# Patient Record
Sex: Female | Born: 1940 | Race: White | Hispanic: No | Marital: Married | Smoking: Never smoker
Health system: Southern US, Community
[De-identification: ages and names within clinical notes are randomized; demographics above are authoritative.]

## PROBLEM LIST (undated history)

## (undated) DIAGNOSIS — C50919 Malignant neoplasm of unspecified site of unspecified female breast: Secondary | ICD-10-CM

## (undated) DIAGNOSIS — K219 Gastro-esophageal reflux disease without esophagitis: Secondary | ICD-10-CM

## (undated) DIAGNOSIS — D649 Anemia, unspecified: Secondary | ICD-10-CM

## (undated) DIAGNOSIS — I10 Essential (primary) hypertension: Secondary | ICD-10-CM

## (undated) HISTORY — DX: Essential (primary) hypertension: I10

## (undated) HISTORY — PX: OTHER SURGICAL HISTORY: SHX169

---

## 1968-04-12 HISTORY — PX: DILATION AND CURETTAGE OF UTERUS: SHX78

## 1998-07-18 ENCOUNTER — Other Ambulatory Visit: Admission: RE | Admit: 1998-07-18 | Discharge: 1998-07-18 | Payer: Self-pay | Admitting: Obstetrics and Gynecology

## 1999-07-29 ENCOUNTER — Other Ambulatory Visit: Admission: RE | Admit: 1999-07-29 | Discharge: 1999-07-29 | Payer: Self-pay | Admitting: Obstetrics and Gynecology

## 2000-07-11 ENCOUNTER — Other Ambulatory Visit: Admission: RE | Admit: 2000-07-11 | Discharge: 2000-07-11 | Payer: Self-pay | Admitting: *Deleted

## 2001-11-28 ENCOUNTER — Other Ambulatory Visit: Admission: RE | Admit: 2001-11-28 | Discharge: 2001-11-28 | Payer: Self-pay | Admitting: Obstetrics and Gynecology

## 2002-11-13 ENCOUNTER — Encounter: Payer: Self-pay | Admitting: Obstetrics and Gynecology

## 2002-11-13 ENCOUNTER — Encounter: Admission: RE | Admit: 2002-11-13 | Discharge: 2002-11-13 | Payer: Self-pay | Admitting: Obstetrics and Gynecology

## 2002-12-04 ENCOUNTER — Other Ambulatory Visit: Admission: RE | Admit: 2002-12-04 | Discharge: 2002-12-04 | Payer: Self-pay | Admitting: Obstetrics and Gynecology

## 2003-01-16 ENCOUNTER — Ambulatory Visit (HOSPITAL_BASED_OUTPATIENT_CLINIC_OR_DEPARTMENT_OTHER): Admission: RE | Admit: 2003-01-16 | Discharge: 2003-01-16 | Payer: Self-pay | Admitting: Orthopedic Surgery

## 2003-01-16 ENCOUNTER — Ambulatory Visit (HOSPITAL_COMMUNITY): Admission: RE | Admit: 2003-01-16 | Discharge: 2003-01-16 | Payer: Self-pay | Admitting: Orthopedic Surgery

## 2003-01-16 ENCOUNTER — Encounter (INDEPENDENT_AMBULATORY_CARE_PROVIDER_SITE_OTHER): Payer: Self-pay | Admitting: *Deleted

## 2004-01-23 ENCOUNTER — Encounter: Admission: RE | Admit: 2004-01-23 | Discharge: 2004-01-23 | Payer: Self-pay | Admitting: Obstetrics and Gynecology

## 2005-02-22 ENCOUNTER — Encounter: Admission: RE | Admit: 2005-02-22 | Discharge: 2005-02-22 | Payer: Self-pay | Admitting: Family Medicine

## 2005-03-08 ENCOUNTER — Other Ambulatory Visit: Admission: RE | Admit: 2005-03-08 | Discharge: 2005-03-08 | Payer: Self-pay | Admitting: Family Medicine

## 2005-03-08 ENCOUNTER — Encounter: Admission: RE | Admit: 2005-03-08 | Discharge: 2005-03-08 | Payer: Self-pay | Admitting: Family Medicine

## 2005-09-27 ENCOUNTER — Emergency Department (HOSPITAL_COMMUNITY): Admission: EM | Admit: 2005-09-27 | Discharge: 2005-09-27 | Payer: Self-pay | Admitting: Emergency Medicine

## 2006-05-03 ENCOUNTER — Encounter: Admission: RE | Admit: 2006-05-03 | Discharge: 2006-05-03 | Payer: Self-pay | Admitting: Obstetrics and Gynecology

## 2006-05-13 ENCOUNTER — Encounter: Admission: RE | Admit: 2006-05-13 | Discharge: 2006-05-13 | Payer: Self-pay | Admitting: Obstetrics and Gynecology

## 2007-05-17 ENCOUNTER — Encounter: Admission: RE | Admit: 2007-05-17 | Discharge: 2007-05-17 | Payer: Self-pay | Admitting: Obstetrics and Gynecology

## 2008-05-21 ENCOUNTER — Encounter: Admission: RE | Admit: 2008-05-21 | Discharge: 2008-05-21 | Payer: Self-pay | Admitting: Obstetrics and Gynecology

## 2009-06-11 ENCOUNTER — Encounter: Admission: RE | Admit: 2009-06-11 | Discharge: 2009-06-11 | Payer: Self-pay | Admitting: Obstetrics and Gynecology

## 2010-05-03 ENCOUNTER — Encounter: Payer: Self-pay | Admitting: Obstetrics and Gynecology

## 2010-08-21 ENCOUNTER — Other Ambulatory Visit: Payer: Self-pay | Admitting: Obstetrics and Gynecology

## 2010-08-21 DIAGNOSIS — Z1231 Encounter for screening mammogram for malignant neoplasm of breast: Secondary | ICD-10-CM

## 2010-08-27 ENCOUNTER — Ambulatory Visit: Payer: Self-pay

## 2010-08-28 NOTE — Op Note (Signed)
   NAME:  Rhonda Richardson, Rhonda Richardson                        ACCOUNT NO.:  [id number]   MEDICAL RECORD NO.:  [id number]                   PATIENT TYPE:  AMB   LOCATION:  DSC                                  FACILITY:  [facility]   PHYSICIAN:  Cindee Salt, M.D.                    DATE OF BIRTH:  1941/03/14   DATE OF PROCEDURE:  01/16/2003  DATE OF DISCHARGE:                                 OPERATIVE REPORT   PREOPERATIVE DIAGNOSIS:  Mucoid cyst, left ring finger.   POSTOPERATIVE DIAGNOSIS:  Mucoid cyst, left ring finger.   OPERATION PERFORMED:  Excision of cyst, debridement of distal phalangeal  joint, left ring finger.   SURGEON:  Cindee Salt, M.D.   ASSISTANTCarolyne Fiscal.   ANESTHESIA:  Forearm based IV regional.   INDICATIONS FOR PROCEDURE:  The patient is a 70 year old female with a  history of a large mucoid cyst on the ulnar aspect of the distal phalangeal  joint, left ring finger.  She desires having this removed.  Is aware of  risks and complications including infection, stiffness, recurrence.   DESCRIPTION OF PROCEDURE:  The patient was brought to the operating room  where a forearm based intravenous regional anesthetic was carried out  without difficulty.  She was prepped using DuraPrep in supine position with  the left arm free.  Curvilinear incision was made over the distal  interphalangeal joint, carried down through subcutaneous tissue.  A large  cyst was immediately encountered with a second cyst more proximally.  With  blunt and sharp dissection the cyst was removed protecting the extensor  tendon.  She did show a tendon sheath to form a mallet prior to the  procedure.  There was some stretching the extensor tendon secondary to the  arthritis.  A synovectomy was then performed along with the removal of  osteophytes from the distal interphalangeal joint. The cyst was sent to  pathology.  The wound was copiously irrigated with saline.  The skin was  then closed with interrupted 5-0  nylon suture.  Sterile compressive dressing  and a splint with the finger fully extended was applied.  The patient  tolerated the procedure well and was taken to the recovery room for  observation in satisfactory condition.  She is discharged to home to return  to the [facility] in one week on Vicodin and Keflex.                                                Cindee Salt, M.D.    Angelique Blonder  D:  01/16/2003  T:  01/16/2003  Job:  [id number]

## 2011-05-19 ENCOUNTER — Other Ambulatory Visit: Payer: Self-pay | Admitting: Obstetrics and Gynecology

## 2011-05-19 DIAGNOSIS — M8580 Other specified disorders of bone density and structure, unspecified site: Secondary | ICD-10-CM

## 2011-06-09 ENCOUNTER — Ambulatory Visit
Admission: RE | Admit: 2011-06-09 | Discharge: 2011-06-09 | Disposition: A | Discharge status: Home / Self Care | Payer: Medicare Other | Source: Ambulatory Visit | Attending: Obstetrics and Gynecology | Admitting: Obstetrics and Gynecology

## 2011-06-09 DIAGNOSIS — M8580 Other specified disorders of bone density and structure, unspecified site: Secondary | ICD-10-CM

## 2011-06-09 DIAGNOSIS — Z780 Asymptomatic menopausal state: Secondary | ICD-10-CM | POA: Diagnosis not present

## 2011-06-09 DIAGNOSIS — Z1231 Encounter for screening mammogram for malignant neoplasm of breast: Secondary | ICD-10-CM | POA: Diagnosis not present

## 2011-06-09 DIAGNOSIS — M899 Disorder of bone, unspecified: Secondary | ICD-10-CM | POA: Diagnosis not present

## 2011-06-09 DIAGNOSIS — M949 Disorder of cartilage, unspecified: Secondary | ICD-10-CM | POA: Diagnosis not present

## 2011-06-15 DIAGNOSIS — I10 Essential (primary) hypertension: Secondary | ICD-10-CM | POA: Diagnosis not present

## 2011-06-15 DIAGNOSIS — Z23 Encounter for immunization: Secondary | ICD-10-CM | POA: Diagnosis not present

## 2011-06-21 DIAGNOSIS — L819 Disorder of pigmentation, unspecified: Secondary | ICD-10-CM | POA: Diagnosis not present

## 2011-06-21 DIAGNOSIS — D239 Other benign neoplasm of skin, unspecified: Secondary | ICD-10-CM | POA: Diagnosis not present

## 2011-06-21 DIAGNOSIS — Z411 Encounter for cosmetic surgery: Secondary | ICD-10-CM | POA: Diagnosis not present

## 2011-07-22 DIAGNOSIS — Z124 Encounter for screening for malignant neoplasm of cervix: Secondary | ICD-10-CM | POA: Diagnosis not present

## 2011-12-20 DIAGNOSIS — Z0000 Encounter for general adult medical examination without abnormal findings: Secondary | ICD-10-CM | POA: Diagnosis not present

## 2011-12-20 DIAGNOSIS — I10 Essential (primary) hypertension: Secondary | ICD-10-CM | POA: Diagnosis not present

## 2012-03-30 DIAGNOSIS — Z23 Encounter for immunization: Secondary | ICD-10-CM | POA: Diagnosis not present

## 2012-04-18 DIAGNOSIS — J3489 Other specified disorders of nose and nasal sinuses: Secondary | ICD-10-CM | POA: Diagnosis not present

## 2012-04-18 DIAGNOSIS — H109 Unspecified conjunctivitis: Secondary | ICD-10-CM | POA: Diagnosis not present

## 2012-05-03 ENCOUNTER — Other Ambulatory Visit: Payer: Self-pay | Admitting: Obstetrics and Gynecology

## 2012-05-03 DIAGNOSIS — Z1231 Encounter for screening mammogram for malignant neoplasm of breast: Secondary | ICD-10-CM

## 2012-06-15 ENCOUNTER — Ambulatory Visit
Admission: RE | Admit: 2012-06-15 | Discharge: 2012-06-15 | Disposition: A | Discharge status: Home / Self Care | Payer: Medicare Other | Source: Ambulatory Visit | Attending: Obstetrics and Gynecology | Admitting: Obstetrics and Gynecology

## 2012-06-26 DIAGNOSIS — I10 Essential (primary) hypertension: Secondary | ICD-10-CM | POA: Diagnosis not present

## 2012-08-09 DIAGNOSIS — J309 Allergic rhinitis, unspecified: Secondary | ICD-10-CM | POA: Diagnosis not present

## 2012-08-09 DIAGNOSIS — J029 Acute pharyngitis, unspecified: Secondary | ICD-10-CM | POA: Diagnosis not present

## 2012-08-09 DIAGNOSIS — H6120 Impacted cerumen, unspecified ear: Secondary | ICD-10-CM | POA: Diagnosis not present

## 2012-11-09 DIAGNOSIS — R143 Flatulence: Secondary | ICD-10-CM | POA: Diagnosis not present

## 2012-11-09 DIAGNOSIS — R142 Eructation: Secondary | ICD-10-CM | POA: Diagnosis not present

## 2012-11-09 DIAGNOSIS — K219 Gastro-esophageal reflux disease without esophagitis: Secondary | ICD-10-CM | POA: Diagnosis not present

## 2013-01-04 DIAGNOSIS — K219 Gastro-esophageal reflux disease without esophagitis: Secondary | ICD-10-CM | POA: Diagnosis not present

## 2013-01-04 DIAGNOSIS — K5900 Constipation, unspecified: Secondary | ICD-10-CM | POA: Diagnosis not present

## 2013-01-04 DIAGNOSIS — R141 Gas pain: Secondary | ICD-10-CM | POA: Diagnosis not present

## 2013-01-24 DIAGNOSIS — Z124 Encounter for screening for malignant neoplasm of cervix: Secondary | ICD-10-CM | POA: Diagnosis not present

## 2013-01-24 DIAGNOSIS — Z01419 Encounter for gynecological examination (general) (routine) without abnormal findings: Secondary | ICD-10-CM | POA: Diagnosis not present

## 2013-01-31 DIAGNOSIS — Z23 Encounter for immunization: Secondary | ICD-10-CM | POA: Diagnosis not present

## 2013-02-28 DIAGNOSIS — R141 Gas pain: Secondary | ICD-10-CM | POA: Diagnosis not present

## 2013-02-28 DIAGNOSIS — K5900 Constipation, unspecified: Secondary | ICD-10-CM | POA: Diagnosis not present

## 2013-02-28 DIAGNOSIS — K219 Gastro-esophageal reflux disease without esophagitis: Secondary | ICD-10-CM | POA: Diagnosis not present

## 2013-03-14 DIAGNOSIS — K219 Gastro-esophageal reflux disease without esophagitis: Secondary | ICD-10-CM | POA: Diagnosis not present

## 2013-03-14 DIAGNOSIS — I10 Essential (primary) hypertension: Secondary | ICD-10-CM | POA: Diagnosis not present

## 2013-03-14 DIAGNOSIS — M810 Age-related osteoporosis without current pathological fracture: Secondary | ICD-10-CM | POA: Diagnosis not present

## 2013-03-14 DIAGNOSIS — Z0000 Encounter for general adult medical examination without abnormal findings: Secondary | ICD-10-CM | POA: Diagnosis not present

## 2013-05-01 DIAGNOSIS — H25049 Posterior subcapsular polar age-related cataract, unspecified eye: Secondary | ICD-10-CM | POA: Diagnosis not present

## 2013-05-01 DIAGNOSIS — H5200 Hypermetropia, unspecified eye: Secondary | ICD-10-CM | POA: Diagnosis not present

## 2013-07-20 ENCOUNTER — Other Ambulatory Visit: Payer: Self-pay

## 2013-07-20 DIAGNOSIS — Z1231 Encounter for screening mammogram for malignant neoplasm of breast: Secondary | ICD-10-CM

## 2013-07-30 ENCOUNTER — Ambulatory Visit
Admission: RE | Admit: 2013-07-30 | Discharge: 2013-07-30 | Disposition: A | Discharge status: Home / Self Care | Payer: Medicare Other | Source: Ambulatory Visit

## 2013-07-30 DIAGNOSIS — Z1231 Encounter for screening mammogram for malignant neoplasm of breast: Secondary | ICD-10-CM

## 2013-08-29 DIAGNOSIS — R143 Flatulence: Secondary | ICD-10-CM | POA: Diagnosis not present

## 2013-08-29 DIAGNOSIS — K219 Gastro-esophageal reflux disease without esophagitis: Secondary | ICD-10-CM | POA: Diagnosis not present

## 2013-08-29 DIAGNOSIS — R141 Gas pain: Secondary | ICD-10-CM | POA: Diagnosis not present

## 2013-08-29 DIAGNOSIS — K5900 Constipation, unspecified: Secondary | ICD-10-CM | POA: Diagnosis not present

## 2013-09-05 DIAGNOSIS — I10 Essential (primary) hypertension: Secondary | ICD-10-CM | POA: Diagnosis not present

## 2014-01-28 DIAGNOSIS — Z124 Encounter for screening for malignant neoplasm of cervix: Secondary | ICD-10-CM | POA: Diagnosis not present

## 2014-01-29 DIAGNOSIS — Z23 Encounter for immunization: Secondary | ICD-10-CM | POA: Diagnosis not present

## 2014-03-04 ENCOUNTER — Other Ambulatory Visit: Payer: Self-pay | Admitting: Obstetrics and Gynecology

## 2014-03-04 DIAGNOSIS — M8580 Other specified disorders of bone density and structure, unspecified site: Secondary | ICD-10-CM

## 2014-03-18 DIAGNOSIS — M109 Gout, unspecified: Secondary | ICD-10-CM | POA: Diagnosis not present

## 2014-03-18 DIAGNOSIS — Z0000 Encounter for general adult medical examination without abnormal findings: Secondary | ICD-10-CM | POA: Diagnosis not present

## 2014-03-18 DIAGNOSIS — K580 Irritable bowel syndrome with diarrhea: Secondary | ICD-10-CM | POA: Diagnosis not present

## 2014-03-18 DIAGNOSIS — E559 Vitamin D deficiency, unspecified: Secondary | ICD-10-CM | POA: Diagnosis not present

## 2014-03-18 DIAGNOSIS — Z23 Encounter for immunization: Secondary | ICD-10-CM | POA: Diagnosis not present

## 2014-03-18 DIAGNOSIS — I10 Essential (primary) hypertension: Secondary | ICD-10-CM | POA: Diagnosis not present

## 2014-03-18 DIAGNOSIS — Z1389 Encounter for screening for other disorder: Secondary | ICD-10-CM | POA: Diagnosis not present

## 2014-03-18 DIAGNOSIS — K219 Gastro-esophageal reflux disease without esophagitis: Secondary | ICD-10-CM | POA: Diagnosis not present

## 2014-03-18 DIAGNOSIS — M810 Age-related osteoporosis without current pathological fracture: Secondary | ICD-10-CM | POA: Diagnosis not present

## 2014-04-17 ENCOUNTER — Ambulatory Visit
Admission: RE | Admit: 2014-04-17 | Discharge: 2014-04-17 | Disposition: A | Discharge status: Home / Self Care | Payer: Medicare Other | Source: Ambulatory Visit | Attending: Obstetrics and Gynecology | Admitting: Obstetrics and Gynecology

## 2014-04-17 DIAGNOSIS — M899 Disorder of bone, unspecified: Secondary | ICD-10-CM | POA: Diagnosis not present

## 2014-04-17 DIAGNOSIS — M8580 Other specified disorders of bone density and structure, unspecified site: Secondary | ICD-10-CM

## 2014-07-16 DIAGNOSIS — H25043 Posterior subcapsular polar age-related cataract, bilateral: Secondary | ICD-10-CM | POA: Diagnosis not present

## 2014-07-16 DIAGNOSIS — H25013 Cortical age-related cataract, bilateral: Secondary | ICD-10-CM | POA: Diagnosis not present

## 2014-07-16 DIAGNOSIS — H52203 Unspecified astigmatism, bilateral: Secondary | ICD-10-CM | POA: Diagnosis not present

## 2014-08-22 DIAGNOSIS — H25812 Combined forms of age-related cataract, left eye: Secondary | ICD-10-CM | POA: Diagnosis not present

## 2014-08-22 DIAGNOSIS — H25042 Posterior subcapsular polar age-related cataract, left eye: Secondary | ICD-10-CM | POA: Diagnosis not present

## 2014-08-22 DIAGNOSIS — H25012 Cortical age-related cataract, left eye: Secondary | ICD-10-CM | POA: Diagnosis not present

## 2014-08-29 DIAGNOSIS — H25811 Combined forms of age-related cataract, right eye: Secondary | ICD-10-CM | POA: Diagnosis not present

## 2014-08-29 DIAGNOSIS — H25011 Cortical age-related cataract, right eye: Secondary | ICD-10-CM | POA: Diagnosis not present

## 2014-08-29 DIAGNOSIS — H52201 Unspecified astigmatism, right eye: Secondary | ICD-10-CM | POA: Diagnosis not present

## 2014-08-29 DIAGNOSIS — H2511 Age-related nuclear cataract, right eye: Secondary | ICD-10-CM | POA: Diagnosis not present

## 2014-09-25 DIAGNOSIS — E780 Pure hypercholesterolemia: Secondary | ICD-10-CM | POA: Diagnosis not present

## 2014-09-25 DIAGNOSIS — I10 Essential (primary) hypertension: Secondary | ICD-10-CM | POA: Diagnosis not present

## 2014-12-02 DIAGNOSIS — H02401 Unspecified ptosis of right eyelid: Secondary | ICD-10-CM | POA: Diagnosis not present

## 2015-01-02 DIAGNOSIS — Z23 Encounter for immunization: Secondary | ICD-10-CM | POA: Diagnosis not present

## 2015-03-24 DIAGNOSIS — E7800 Pure hypercholesterolemia, unspecified: Secondary | ICD-10-CM | POA: Diagnosis not present

## 2015-03-24 DIAGNOSIS — Z1389 Encounter for screening for other disorder: Secondary | ICD-10-CM | POA: Diagnosis not present

## 2015-03-24 DIAGNOSIS — K219 Gastro-esophageal reflux disease without esophagitis: Secondary | ICD-10-CM | POA: Diagnosis not present

## 2015-03-24 DIAGNOSIS — I10 Essential (primary) hypertension: Secondary | ICD-10-CM | POA: Diagnosis not present

## 2015-03-24 DIAGNOSIS — Z0000 Encounter for general adult medical examination without abnormal findings: Secondary | ICD-10-CM | POA: Diagnosis not present

## 2015-03-24 DIAGNOSIS — K580 Irritable bowel syndrome with diarrhea: Secondary | ICD-10-CM | POA: Diagnosis not present

## 2015-03-24 DIAGNOSIS — M109 Gout, unspecified: Secondary | ICD-10-CM | POA: Diagnosis not present

## 2015-03-24 DIAGNOSIS — M810 Age-related osteoporosis without current pathological fracture: Secondary | ICD-10-CM | POA: Diagnosis not present

## 2015-03-31 DIAGNOSIS — H02401 Unspecified ptosis of right eyelid: Secondary | ICD-10-CM | POA: Diagnosis not present

## 2015-06-05 DIAGNOSIS — Z1211 Encounter for screening for malignant neoplasm of colon: Secondary | ICD-10-CM | POA: Diagnosis not present

## 2015-07-03 DIAGNOSIS — H9319 Tinnitus, unspecified ear: Secondary | ICD-10-CM | POA: Diagnosis not present

## 2015-07-03 DIAGNOSIS — H9190 Unspecified hearing loss, unspecified ear: Secondary | ICD-10-CM | POA: Diagnosis not present

## 2015-07-10 ENCOUNTER — Other Ambulatory Visit: Payer: Self-pay

## 2015-07-10 DIAGNOSIS — Z1231 Encounter for screening mammogram for malignant neoplasm of breast: Secondary | ICD-10-CM

## 2015-07-30 DIAGNOSIS — H9313 Tinnitus, bilateral: Secondary | ICD-10-CM | POA: Diagnosis not present

## 2015-07-30 DIAGNOSIS — H6121 Impacted cerumen, right ear: Secondary | ICD-10-CM | POA: Diagnosis not present

## 2015-08-06 ENCOUNTER — Ambulatory Visit
Admission: RE | Admit: 2015-08-06 | Discharge: 2015-08-06 | Disposition: A | Discharge status: Home / Self Care | Payer: Medicare Other | Source: Ambulatory Visit

## 2015-08-06 DIAGNOSIS — Z1231 Encounter for screening mammogram for malignant neoplasm of breast: Secondary | ICD-10-CM | POA: Diagnosis not present

## 2015-08-11 DIAGNOSIS — H02402 Unspecified ptosis of left eyelid: Secondary | ICD-10-CM | POA: Diagnosis not present

## 2015-08-13 DIAGNOSIS — H903 Sensorineural hearing loss, bilateral: Secondary | ICD-10-CM | POA: Insufficient documentation

## 2015-08-13 DIAGNOSIS — H9042 Sensorineural hearing loss, unilateral, left ear, with unrestricted hearing on the contralateral side: Secondary | ICD-10-CM | POA: Diagnosis not present

## 2015-08-13 DIAGNOSIS — H9313 Tinnitus, bilateral: Secondary | ICD-10-CM | POA: Diagnosis not present

## 2015-09-25 DIAGNOSIS — I10 Essential (primary) hypertension: Secondary | ICD-10-CM | POA: Diagnosis not present

## 2015-09-25 DIAGNOSIS — E7800 Pure hypercholesterolemia, unspecified: Secondary | ICD-10-CM | POA: Diagnosis not present

## 2015-09-25 DIAGNOSIS — H9319 Tinnitus, unspecified ear: Secondary | ICD-10-CM | POA: Diagnosis not present

## 2015-09-29 DIAGNOSIS — H524 Presbyopia: Secondary | ICD-10-CM | POA: Diagnosis not present

## 2015-09-29 DIAGNOSIS — Z961 Presence of intraocular lens: Secondary | ICD-10-CM | POA: Diagnosis not present

## 2015-10-22 DIAGNOSIS — L719 Rosacea, unspecified: Secondary | ICD-10-CM | POA: Diagnosis not present

## 2015-10-22 DIAGNOSIS — Z86018 Personal history of other benign neoplasm: Secondary | ICD-10-CM | POA: Diagnosis not present

## 2015-10-22 DIAGNOSIS — D1801 Hemangioma of skin and subcutaneous tissue: Secondary | ICD-10-CM | POA: Diagnosis not present

## 2015-10-22 DIAGNOSIS — L821 Other seborrheic keratosis: Secondary | ICD-10-CM | POA: Diagnosis not present

## 2015-10-22 DIAGNOSIS — L814 Other melanin hyperpigmentation: Secondary | ICD-10-CM | POA: Diagnosis not present

## 2015-10-22 DIAGNOSIS — D225 Melanocytic nevi of trunk: Secondary | ICD-10-CM | POA: Diagnosis not present

## 2015-10-22 DIAGNOSIS — Z411 Encounter for cosmetic surgery: Secondary | ICD-10-CM | POA: Diagnosis not present

## 2015-10-28 DIAGNOSIS — R002 Palpitations: Secondary | ICD-10-CM | POA: Diagnosis not present

## 2016-01-29 DIAGNOSIS — M545 Low back pain: Secondary | ICD-10-CM | POA: Diagnosis not present

## 2016-02-02 DIAGNOSIS — Z124 Encounter for screening for malignant neoplasm of cervix: Secondary | ICD-10-CM | POA: Diagnosis not present

## 2016-02-04 DIAGNOSIS — Z23 Encounter for immunization: Secondary | ICD-10-CM | POA: Diagnosis not present

## 2016-03-02 DIAGNOSIS — R499 Unspecified voice and resonance disorder: Secondary | ICD-10-CM | POA: Diagnosis not present

## 2016-04-08 DIAGNOSIS — K219 Gastro-esophageal reflux disease without esophagitis: Secondary | ICD-10-CM | POA: Insufficient documentation

## 2016-04-08 DIAGNOSIS — R490 Dysphonia: Secondary | ICD-10-CM | POA: Diagnosis not present

## 2016-04-16 DIAGNOSIS — R490 Dysphonia: Secondary | ICD-10-CM | POA: Diagnosis not present

## 2016-04-16 DIAGNOSIS — F458 Other somatoform disorders: Secondary | ICD-10-CM | POA: Diagnosis not present

## 2016-04-16 DIAGNOSIS — R499 Unspecified voice and resonance disorder: Secondary | ICD-10-CM | POA: Diagnosis not present

## 2016-04-22 DIAGNOSIS — H9190 Unspecified hearing loss, unspecified ear: Secondary | ICD-10-CM | POA: Diagnosis not present

## 2016-04-22 DIAGNOSIS — Z0000 Encounter for general adult medical examination without abnormal findings: Secondary | ICD-10-CM | POA: Diagnosis not present

## 2016-04-22 DIAGNOSIS — K219 Gastro-esophageal reflux disease without esophagitis: Secondary | ICD-10-CM | POA: Diagnosis not present

## 2016-04-22 DIAGNOSIS — I10 Essential (primary) hypertension: Secondary | ICD-10-CM | POA: Diagnosis not present

## 2016-04-22 DIAGNOSIS — M810 Age-related osteoporosis without current pathological fracture: Secondary | ICD-10-CM | POA: Diagnosis not present

## 2016-04-22 DIAGNOSIS — K580 Irritable bowel syndrome with diarrhea: Secondary | ICD-10-CM | POA: Diagnosis not present

## 2016-04-22 DIAGNOSIS — H9319 Tinnitus, unspecified ear: Secondary | ICD-10-CM | POA: Diagnosis not present

## 2016-04-22 DIAGNOSIS — E7800 Pure hypercholesterolemia, unspecified: Secondary | ICD-10-CM | POA: Diagnosis not present

## 2016-04-22 DIAGNOSIS — Z1389 Encounter for screening for other disorder: Secondary | ICD-10-CM | POA: Diagnosis not present

## 2016-09-07 DIAGNOSIS — K219 Gastro-esophageal reflux disease without esophagitis: Secondary | ICD-10-CM | POA: Diagnosis not present

## 2016-09-07 DIAGNOSIS — F458 Other somatoform disorders: Secondary | ICD-10-CM | POA: Diagnosis not present

## 2016-09-07 DIAGNOSIS — R490 Dysphonia: Secondary | ICD-10-CM | POA: Diagnosis not present

## 2016-10-04 DIAGNOSIS — H26493 Other secondary cataract, bilateral: Secondary | ICD-10-CM | POA: Diagnosis not present

## 2016-10-04 DIAGNOSIS — H524 Presbyopia: Secondary | ICD-10-CM | POA: Diagnosis not present

## 2016-10-20 DIAGNOSIS — K219 Gastro-esophageal reflux disease without esophagitis: Secondary | ICD-10-CM | POA: Diagnosis not present

## 2016-10-20 DIAGNOSIS — E7800 Pure hypercholesterolemia, unspecified: Secondary | ICD-10-CM | POA: Diagnosis not present

## 2016-10-20 DIAGNOSIS — I10 Essential (primary) hypertension: Secondary | ICD-10-CM | POA: Diagnosis not present

## 2016-11-29 DIAGNOSIS — R490 Dysphonia: Secondary | ICD-10-CM | POA: Diagnosis not present

## 2016-11-29 DIAGNOSIS — K219 Gastro-esophageal reflux disease without esophagitis: Secondary | ICD-10-CM | POA: Diagnosis not present

## 2017-01-03 DIAGNOSIS — Z23 Encounter for immunization: Secondary | ICD-10-CM | POA: Diagnosis not present

## 2017-01-28 DIAGNOSIS — J040 Acute laryngitis: Secondary | ICD-10-CM | POA: Diagnosis not present

## 2017-01-28 DIAGNOSIS — K219 Gastro-esophageal reflux disease without esophagitis: Secondary | ICD-10-CM | POA: Diagnosis not present

## 2017-05-04 DIAGNOSIS — Z1211 Encounter for screening for malignant neoplasm of colon: Secondary | ICD-10-CM | POA: Diagnosis not present

## 2017-05-04 DIAGNOSIS — K5901 Slow transit constipation: Secondary | ICD-10-CM | POA: Diagnosis not present

## 2017-05-04 DIAGNOSIS — R490 Dysphonia: Secondary | ICD-10-CM | POA: Diagnosis not present

## 2017-05-20 DIAGNOSIS — I10 Essential (primary) hypertension: Secondary | ICD-10-CM | POA: Diagnosis not present

## 2017-05-20 DIAGNOSIS — K219 Gastro-esophageal reflux disease without esophagitis: Secondary | ICD-10-CM | POA: Diagnosis not present

## 2017-05-20 DIAGNOSIS — E7800 Pure hypercholesterolemia, unspecified: Secondary | ICD-10-CM | POA: Diagnosis not present

## 2017-07-01 ENCOUNTER — Other Ambulatory Visit: Payer: Self-pay | Admitting: Obstetrics and Gynecology

## 2017-07-01 DIAGNOSIS — Z1231 Encounter for screening mammogram for malignant neoplasm of breast: Secondary | ICD-10-CM

## 2017-07-19 ENCOUNTER — Ambulatory Visit
Admission: RE | Admit: 2017-07-19 | Discharge: 2017-07-19 | Disposition: A | Discharge status: Home / Self Care | Payer: Medicare Other | Source: Ambulatory Visit | Attending: Obstetrics and Gynecology | Admitting: Obstetrics and Gynecology

## 2017-07-19 DIAGNOSIS — Z1231 Encounter for screening mammogram for malignant neoplasm of breast: Secondary | ICD-10-CM | POA: Diagnosis not present

## 2017-07-26 DIAGNOSIS — Z124 Encounter for screening for malignant neoplasm of cervix: Secondary | ICD-10-CM | POA: Diagnosis not present

## 2017-10-10 DIAGNOSIS — Z961 Presence of intraocular lens: Secondary | ICD-10-CM | POA: Diagnosis not present

## 2017-10-10 DIAGNOSIS — H524 Presbyopia: Secondary | ICD-10-CM | POA: Diagnosis not present

## 2017-11-01 DIAGNOSIS — K580 Irritable bowel syndrome with diarrhea: Secondary | ICD-10-CM | POA: Diagnosis not present

## 2017-11-01 DIAGNOSIS — I10 Essential (primary) hypertension: Secondary | ICD-10-CM | POA: Diagnosis not present

## 2017-11-01 DIAGNOSIS — Z0000 Encounter for general adult medical examination without abnormal findings: Secondary | ICD-10-CM | POA: Diagnosis not present

## 2017-11-01 DIAGNOSIS — E7800 Pure hypercholesterolemia, unspecified: Secondary | ICD-10-CM | POA: Diagnosis not present

## 2017-11-01 DIAGNOSIS — H9190 Unspecified hearing loss, unspecified ear: Secondary | ICD-10-CM | POA: Diagnosis not present

## 2017-11-01 DIAGNOSIS — M810 Age-related osteoporosis without current pathological fracture: Secondary | ICD-10-CM | POA: Diagnosis not present

## 2017-11-01 DIAGNOSIS — H9319 Tinnitus, unspecified ear: Secondary | ICD-10-CM | POA: Diagnosis not present

## 2017-11-01 DIAGNOSIS — K219 Gastro-esophageal reflux disease without esophagitis: Secondary | ICD-10-CM | POA: Diagnosis not present

## 2017-11-01 DIAGNOSIS — Z1389 Encounter for screening for other disorder: Secondary | ICD-10-CM | POA: Diagnosis not present

## 2017-12-06 DIAGNOSIS — M810 Age-related osteoporosis without current pathological fracture: Secondary | ICD-10-CM | POA: Diagnosis not present

## 2018-01-27 DIAGNOSIS — Z23 Encounter for immunization: Secondary | ICD-10-CM | POA: Diagnosis not present

## 2018-04-14 DIAGNOSIS — H1032 Unspecified acute conjunctivitis, left eye: Secondary | ICD-10-CM | POA: Diagnosis not present

## 2018-05-04 DIAGNOSIS — I10 Essential (primary) hypertension: Secondary | ICD-10-CM | POA: Diagnosis not present

## 2018-05-04 DIAGNOSIS — M810 Age-related osteoporosis without current pathological fracture: Secondary | ICD-10-CM | POA: Diagnosis not present

## 2018-05-31 DIAGNOSIS — H1032 Unspecified acute conjunctivitis, left eye: Secondary | ICD-10-CM | POA: Diagnosis not present

## 2018-10-04 DIAGNOSIS — D225 Melanocytic nevi of trunk: Secondary | ICD-10-CM | POA: Diagnosis not present

## 2018-10-04 DIAGNOSIS — L720 Epidermal cyst: Secondary | ICD-10-CM | POA: Diagnosis not present

## 2018-10-04 DIAGNOSIS — L821 Other seborrheic keratosis: Secondary | ICD-10-CM | POA: Diagnosis not present

## 2018-10-04 DIAGNOSIS — L814 Other melanin hyperpigmentation: Secondary | ICD-10-CM | POA: Diagnosis not present

## 2018-10-04 DIAGNOSIS — Z86018 Personal history of other benign neoplasm: Secondary | ICD-10-CM | POA: Diagnosis not present

## 2018-10-25 DIAGNOSIS — Z961 Presence of intraocular lens: Secondary | ICD-10-CM | POA: Diagnosis not present

## 2019-01-13 DIAGNOSIS — Z23 Encounter for immunization: Secondary | ICD-10-CM | POA: Diagnosis not present

## 2019-02-01 DIAGNOSIS — F458 Other somatoform disorders: Secondary | ICD-10-CM | POA: Diagnosis not present

## 2019-02-01 DIAGNOSIS — K219 Gastro-esophageal reflux disease without esophagitis: Secondary | ICD-10-CM | POA: Diagnosis not present

## 2019-02-01 DIAGNOSIS — I10 Essential (primary) hypertension: Secondary | ICD-10-CM | POA: Diagnosis not present

## 2019-02-01 DIAGNOSIS — H9190 Unspecified hearing loss, unspecified ear: Secondary | ICD-10-CM | POA: Diagnosis not present

## 2019-02-01 DIAGNOSIS — M810 Age-related osteoporosis without current pathological fracture: Secondary | ICD-10-CM | POA: Diagnosis not present

## 2019-02-01 DIAGNOSIS — H9319 Tinnitus, unspecified ear: Secondary | ICD-10-CM | POA: Diagnosis not present

## 2019-02-01 DIAGNOSIS — Z1389 Encounter for screening for other disorder: Secondary | ICD-10-CM | POA: Diagnosis not present

## 2019-02-01 DIAGNOSIS — K580 Irritable bowel syndrome with diarrhea: Secondary | ICD-10-CM | POA: Diagnosis not present

## 2019-02-01 DIAGNOSIS — E559 Vitamin D deficiency, unspecified: Secondary | ICD-10-CM | POA: Diagnosis not present

## 2019-02-01 DIAGNOSIS — E7800 Pure hypercholesterolemia, unspecified: Secondary | ICD-10-CM | POA: Diagnosis not present

## 2019-02-01 DIAGNOSIS — Z0000 Encounter for general adult medical examination without abnormal findings: Secondary | ICD-10-CM | POA: Diagnosis not present

## 2019-05-01 DIAGNOSIS — Z20828 Contact with and (suspected) exposure to other viral communicable diseases: Secondary | ICD-10-CM | POA: Diagnosis not present

## 2019-05-07 DIAGNOSIS — Z20828 Contact with and (suspected) exposure to other viral communicable diseases: Secondary | ICD-10-CM | POA: Diagnosis not present

## 2019-07-24 ENCOUNTER — Other Ambulatory Visit: Payer: Self-pay | Admitting: Family Medicine

## 2019-07-24 DIAGNOSIS — Z1231 Encounter for screening mammogram for malignant neoplasm of breast: Secondary | ICD-10-CM

## 2019-08-29 ENCOUNTER — Ambulatory Visit: Payer: Medicare Other

## 2019-09-13 ENCOUNTER — Ambulatory Visit
Admission: RE | Admit: 2019-09-13 | Discharge: 2019-09-13 | Disposition: A | Discharge status: Home / Self Care | Payer: Medicare Other | Source: Ambulatory Visit | Attending: Family Medicine | Admitting: Family Medicine

## 2019-09-13 ENCOUNTER — Other Ambulatory Visit: Payer: Self-pay

## 2019-09-13 DIAGNOSIS — Z1231 Encounter for screening mammogram for malignant neoplasm of breast: Secondary | ICD-10-CM

## 2019-11-21 DIAGNOSIS — H524 Presbyopia: Secondary | ICD-10-CM | POA: Diagnosis not present

## 2019-11-21 DIAGNOSIS — Z961 Presence of intraocular lens: Secondary | ICD-10-CM | POA: Diagnosis not present

## 2019-11-21 DIAGNOSIS — H01005 Unspecified blepharitis left lower eyelid: Secondary | ICD-10-CM | POA: Diagnosis not present

## 2019-11-21 DIAGNOSIS — H5211 Myopia, right eye: Secondary | ICD-10-CM | POA: Diagnosis not present

## 2020-02-06 DIAGNOSIS — K580 Irritable bowel syndrome with diarrhea: Secondary | ICD-10-CM | POA: Diagnosis not present

## 2020-02-06 DIAGNOSIS — H6123 Impacted cerumen, bilateral: Secondary | ICD-10-CM | POA: Diagnosis not present

## 2020-02-06 DIAGNOSIS — E7800 Pure hypercholesterolemia, unspecified: Secondary | ICD-10-CM | POA: Diagnosis not present

## 2020-02-06 DIAGNOSIS — I10 Essential (primary) hypertension: Secondary | ICD-10-CM | POA: Diagnosis not present

## 2020-02-06 DIAGNOSIS — K219 Gastro-esophageal reflux disease without esophagitis: Secondary | ICD-10-CM | POA: Diagnosis not present

## 2020-02-06 DIAGNOSIS — Z23 Encounter for immunization: Secondary | ICD-10-CM | POA: Diagnosis not present

## 2020-02-06 DIAGNOSIS — M810 Age-related osteoporosis without current pathological fracture: Secondary | ICD-10-CM | POA: Diagnosis not present

## 2020-02-06 DIAGNOSIS — H9190 Unspecified hearing loss, unspecified ear: Secondary | ICD-10-CM | POA: Diagnosis not present

## 2020-02-06 DIAGNOSIS — Z0000 Encounter for general adult medical examination without abnormal findings: Secondary | ICD-10-CM | POA: Diagnosis not present

## 2020-02-06 DIAGNOSIS — E559 Vitamin D deficiency, unspecified: Secondary | ICD-10-CM | POA: Diagnosis not present

## 2020-02-06 DIAGNOSIS — H9319 Tinnitus, unspecified ear: Secondary | ICD-10-CM | POA: Diagnosis not present

## 2020-02-06 DIAGNOSIS — Z1389 Encounter for screening for other disorder: Secondary | ICD-10-CM | POA: Diagnosis not present

## 2020-02-20 ENCOUNTER — Other Ambulatory Visit: Payer: Self-pay | Admitting: Family Medicine

## 2020-02-20 DIAGNOSIS — M810 Age-related osteoporosis without current pathological fracture: Secondary | ICD-10-CM

## 2020-04-17 DIAGNOSIS — Z23 Encounter for immunization: Secondary | ICD-10-CM | POA: Diagnosis not present

## 2020-07-02 ENCOUNTER — Other Ambulatory Visit: Payer: Medicare Other

## 2020-08-26 ENCOUNTER — Other Ambulatory Visit: Payer: Medicare Other

## 2020-08-27 ENCOUNTER — Other Ambulatory Visit: Payer: Self-pay

## 2020-08-27 ENCOUNTER — Ambulatory Visit
Admission: RE | Admit: 2020-08-27 | Discharge: 2020-08-27 | Disposition: A | Discharge status: Home / Self Care | Payer: Medicare Other | Source: Ambulatory Visit | Attending: Family Medicine | Admitting: Family Medicine

## 2020-08-27 DIAGNOSIS — M810 Age-related osteoporosis without current pathological fracture: Secondary | ICD-10-CM

## 2020-08-27 DIAGNOSIS — M8588 Other specified disorders of bone density and structure, other site: Secondary | ICD-10-CM | POA: Diagnosis not present

## 2020-08-27 DIAGNOSIS — Z780 Asymptomatic menopausal state: Secondary | ICD-10-CM | POA: Diagnosis not present

## 2020-10-06 DIAGNOSIS — Z6827 Body mass index (BMI) 27.0-27.9, adult: Secondary | ICD-10-CM | POA: Diagnosis not present

## 2020-10-06 DIAGNOSIS — M8580 Other specified disorders of bone density and structure, unspecified site: Secondary | ICD-10-CM | POA: Diagnosis not present

## 2020-10-06 DIAGNOSIS — Z01419 Encounter for gynecological examination (general) (routine) without abnormal findings: Secondary | ICD-10-CM | POA: Diagnosis not present

## 2020-10-06 DIAGNOSIS — Z124 Encounter for screening for malignant neoplasm of cervix: Secondary | ICD-10-CM | POA: Diagnosis not present

## 2020-10-06 DIAGNOSIS — Z01411 Encounter for gynecological examination (general) (routine) with abnormal findings: Secondary | ICD-10-CM | POA: Diagnosis not present

## 2020-10-07 DIAGNOSIS — L821 Other seborrheic keratosis: Secondary | ICD-10-CM | POA: Diagnosis not present

## 2020-10-07 DIAGNOSIS — Z86018 Personal history of other benign neoplasm: Secondary | ICD-10-CM | POA: Diagnosis not present

## 2020-10-07 DIAGNOSIS — D225 Melanocytic nevi of trunk: Secondary | ICD-10-CM | POA: Diagnosis not present

## 2020-10-07 DIAGNOSIS — L814 Other melanin hyperpigmentation: Secondary | ICD-10-CM | POA: Diagnosis not present

## 2020-11-26 DIAGNOSIS — H52203 Unspecified astigmatism, bilateral: Secondary | ICD-10-CM | POA: Diagnosis not present

## 2020-11-26 DIAGNOSIS — Z961 Presence of intraocular lens: Secondary | ICD-10-CM | POA: Diagnosis not present

## 2020-12-26 DIAGNOSIS — R309 Painful micturition, unspecified: Secondary | ICD-10-CM | POA: Diagnosis not present

## 2020-12-26 DIAGNOSIS — N3090 Cystitis, unspecified without hematuria: Secondary | ICD-10-CM | POA: Diagnosis not present

## 2021-01-23 DIAGNOSIS — Z23 Encounter for immunization: Secondary | ICD-10-CM | POA: Diagnosis not present

## 2021-02-17 DIAGNOSIS — E7800 Pure hypercholesterolemia, unspecified: Secondary | ICD-10-CM | POA: Diagnosis not present

## 2021-02-17 DIAGNOSIS — K580 Irritable bowel syndrome with diarrhea: Secondary | ICD-10-CM | POA: Diagnosis not present

## 2021-02-17 DIAGNOSIS — F458 Other somatoform disorders: Secondary | ICD-10-CM | POA: Diagnosis not present

## 2021-02-17 DIAGNOSIS — I10 Essential (primary) hypertension: Secondary | ICD-10-CM | POA: Diagnosis not present

## 2021-02-17 DIAGNOSIS — Z1389 Encounter for screening for other disorder: Secondary | ICD-10-CM | POA: Diagnosis not present

## 2021-02-17 DIAGNOSIS — K219 Gastro-esophageal reflux disease without esophagitis: Secondary | ICD-10-CM | POA: Diagnosis not present

## 2021-02-17 DIAGNOSIS — E559 Vitamin D deficiency, unspecified: Secondary | ICD-10-CM | POA: Diagnosis not present

## 2021-02-17 DIAGNOSIS — Z0000 Encounter for general adult medical examination without abnormal findings: Secondary | ICD-10-CM | POA: Diagnosis not present

## 2021-02-17 DIAGNOSIS — M810 Age-related osteoporosis without current pathological fracture: Secondary | ICD-10-CM | POA: Diagnosis not present

## 2021-02-17 DIAGNOSIS — R490 Dysphonia: Secondary | ICD-10-CM | POA: Diagnosis not present

## 2021-03-12 DIAGNOSIS — M25572 Pain in left ankle and joints of left foot: Secondary | ICD-10-CM | POA: Diagnosis not present

## 2021-03-12 DIAGNOSIS — M76822 Posterior tibial tendinitis, left leg: Secondary | ICD-10-CM | POA: Diagnosis not present

## 2021-07-28 ENCOUNTER — Other Ambulatory Visit: Payer: Self-pay | Admitting: Family Medicine

## 2021-07-28 DIAGNOSIS — Z1231 Encounter for screening mammogram for malignant neoplasm of breast: Secondary | ICD-10-CM

## 2021-08-12 ENCOUNTER — Ambulatory Visit
Admission: RE | Admit: 2021-08-12 | Discharge: 2021-08-12 | Disposition: A | Discharge status: Home / Self Care | Payer: Medicare Other | Source: Ambulatory Visit | Attending: Family Medicine | Admitting: Family Medicine

## 2021-08-12 DIAGNOSIS — Z1231 Encounter for screening mammogram for malignant neoplasm of breast: Secondary | ICD-10-CM | POA: Diagnosis not present

## 2021-08-13 ENCOUNTER — Other Ambulatory Visit: Payer: Self-pay | Admitting: Family Medicine

## 2021-08-13 DIAGNOSIS — R928 Other abnormal and inconclusive findings on diagnostic imaging of breast: Secondary | ICD-10-CM

## 2021-08-25 ENCOUNTER — Ambulatory Visit
Admission: RE | Admit: 2021-08-25 | Discharge: 2021-08-25 | Disposition: A | Discharge status: Home / Self Care | Payer: Medicare Other | Source: Ambulatory Visit | Attending: Family Medicine | Admitting: Family Medicine

## 2021-08-25 ENCOUNTER — Other Ambulatory Visit: Payer: Self-pay | Admitting: Family Medicine

## 2021-08-25 DIAGNOSIS — N6310 Unspecified lump in the right breast, unspecified quadrant: Secondary | ICD-10-CM

## 2021-08-25 DIAGNOSIS — N6311 Unspecified lump in the right breast, upper outer quadrant: Secondary | ICD-10-CM | POA: Diagnosis not present

## 2021-08-25 DIAGNOSIS — R928 Other abnormal and inconclusive findings on diagnostic imaging of breast: Secondary | ICD-10-CM

## 2021-08-25 DIAGNOSIS — R921 Mammographic calcification found on diagnostic imaging of breast: Secondary | ICD-10-CM | POA: Diagnosis not present

## 2021-08-25 DIAGNOSIS — E7800 Pure hypercholesterolemia, unspecified: Secondary | ICD-10-CM | POA: Diagnosis not present

## 2021-08-25 DIAGNOSIS — E559 Vitamin D deficiency, unspecified: Secondary | ICD-10-CM | POA: Diagnosis not present

## 2021-08-31 ENCOUNTER — Ambulatory Visit
Admission: RE | Admit: 2021-08-31 | Discharge: 2021-08-31 | Disposition: A | Discharge status: Home / Self Care | Payer: Medicare Other | Source: Ambulatory Visit | Attending: Family Medicine | Admitting: Family Medicine

## 2021-08-31 DIAGNOSIS — C50411 Malignant neoplasm of upper-outer quadrant of right female breast: Secondary | ICD-10-CM | POA: Diagnosis not present

## 2021-08-31 DIAGNOSIS — N6310 Unspecified lump in the right breast, unspecified quadrant: Secondary | ICD-10-CM

## 2021-08-31 DIAGNOSIS — N6311 Unspecified lump in the right breast, upper outer quadrant: Secondary | ICD-10-CM | POA: Diagnosis not present

## 2021-09-01 ENCOUNTER — Telehealth: Payer: Self-pay | Admitting: Hematology and Oncology

## 2021-09-01 NOTE — Telephone Encounter (Signed)
Spoke to patient to confirm morning clinic appointment for 5/31, paperwork will be mailed to patient

## 2021-09-08 ENCOUNTER — Encounter: Payer: Self-pay | Admitting: *Deleted

## 2021-09-08 DIAGNOSIS — C50411 Malignant neoplasm of upper-outer quadrant of right female breast: Secondary | ICD-10-CM | POA: Insufficient documentation

## 2021-09-09 ENCOUNTER — Other Ambulatory Visit: Payer: Self-pay

## 2021-09-09 ENCOUNTER — Inpatient Hospital Stay (HOSPITAL_BASED_OUTPATIENT_CLINIC_OR_DEPARTMENT_OTHER): Payer: Medicare Other | Admitting: Hematology and Oncology

## 2021-09-09 ENCOUNTER — Inpatient Hospital Stay (HOSPITAL_BASED_OUTPATIENT_CLINIC_OR_DEPARTMENT_OTHER): Payer: Medicare Other | Admitting: Genetic Counselor

## 2021-09-09 ENCOUNTER — Encounter: Payer: Self-pay | Admitting: *Deleted

## 2021-09-09 ENCOUNTER — Other Ambulatory Visit: Payer: Self-pay | Admitting: *Deleted

## 2021-09-09 ENCOUNTER — Ambulatory Visit: Payer: Medicare Other | Attending: General Surgery | Admitting: Physical Therapy

## 2021-09-09 ENCOUNTER — Inpatient Hospital Stay: Payer: Medicare Other | Admitting: Licensed Clinical Social Worker

## 2021-09-09 ENCOUNTER — Ambulatory Visit
Admission: RE | Admit: 2021-09-09 | Discharge: 2021-09-09 | Disposition: A | Discharge status: Home / Self Care | Payer: Medicare Other | Source: Ambulatory Visit | Attending: Radiation Oncology | Admitting: Radiation Oncology

## 2021-09-09 ENCOUNTER — Other Ambulatory Visit: Payer: Self-pay | Admitting: General Surgery

## 2021-09-09 ENCOUNTER — Encounter: Payer: Self-pay | Admitting: Physical Therapy

## 2021-09-09 ENCOUNTER — Encounter: Payer: Self-pay | Admitting: Hematology and Oncology

## 2021-09-09 ENCOUNTER — Inpatient Hospital Stay: Payer: Medicare Other | Attending: Hematology and Oncology

## 2021-09-09 DIAGNOSIS — Z7289 Other problems related to lifestyle: Secondary | ICD-10-CM | POA: Insufficient documentation

## 2021-09-09 DIAGNOSIS — Z8249 Family history of ischemic heart disease and other diseases of the circulatory system: Secondary | ICD-10-CM | POA: Diagnosis not present

## 2021-09-09 DIAGNOSIS — Z170 Estrogen receptor positive status [ER+]: Secondary | ICD-10-CM | POA: Insufficient documentation

## 2021-09-09 DIAGNOSIS — C50411 Malignant neoplasm of upper-outer quadrant of right female breast: Secondary | ICD-10-CM

## 2021-09-09 DIAGNOSIS — R293 Abnormal posture: Secondary | ICD-10-CM

## 2021-09-09 DIAGNOSIS — I10 Essential (primary) hypertension: Secondary | ICD-10-CM | POA: Insufficient documentation

## 2021-09-09 LAB — CMP (CANCER CENTER ONLY)
ALT: 18 U/L (ref 0–44)
AST: 17 U/L (ref 15–41)
Albumin: 4.3 g/dL (ref 3.5–5.0)
Alkaline Phosphatase: 54 U/L (ref 38–126)
Anion gap: 5 (ref 5–15)
BUN: 18 mg/dL (ref 8–23)
CO2: 30 mmol/L (ref 22–32)
Calcium: 9.7 mg/dL (ref 8.9–10.3)
Chloride: 103 mmol/L (ref 98–111)
Creatinine: 0.73 mg/dL (ref 0.44–1.00)
GFR, Estimated: >60 mL/min (ref >60)
Glucose, Bld: 89 mg/dL (ref 70–99)
Potassium: 3.6 mmol/L (ref 3.5–5.1)
Sodium: 138 mmol/L (ref 135–145)
Total Bilirubin: 0.5 mg/dL (ref 0.3–1.2)
Total Protein: 7 g/dL (ref 6.5–8.1)

## 2021-09-09 LAB — CBC WITH DIFFERENTIAL (CANCER CENTER ONLY)
Abs Immature Granulocytes: 0 10*3/uL (ref 0.00–0.07)
Basophils Absolute: 0.1 10*3/uL (ref 0.0–0.1)
Basophils Relative: 1 %
Eosinophils Absolute: 0.2 10*3/uL (ref 0.0–0.5)
Eosinophils Relative: 4 %
HCT: 38.3 % (ref 36.0–46.0)
Hemoglobin: 12.5 g/dL (ref 12.0–15.0)
Immature Granulocytes: 0 %
Lymphocytes Relative: 36 %
Lymphs Abs: 1.5 10*3/uL (ref 0.7–4.0)
MCH: 26.7 pg (ref 26.0–34.0)
MCHC: 32.6 g/dL (ref 30.0–36.0)
MCV: 81.7 fL (ref 80.0–100.0)
Monocytes Absolute: 0.4 10*3/uL (ref 0.1–1.0)
Monocytes Relative: 8 %
Neutro Abs: 2.2 10*3/uL (ref 1.7–7.7)
Neutrophils Relative %: 51 %
Platelet Count: 249 10*3/uL (ref 150–400)
RBC: 4.69 MIL/uL (ref 3.87–5.11)
RDW: 14.5 % (ref 11.5–15.5)
WBC Count: 4.3 10*3/uL (ref 4.0–10.5)
nRBC: 0 % (ref 0.0–0.2)

## 2021-09-09 LAB — GENETIC SCREENING ORDER

## 2021-09-09 NOTE — Research (Signed)
[employer] 2021-05 - Specimen Collection Study to Evaluate Biomarkers in Subjects with Cancer   Patient Rhonda Richardson was identified by Dr Iruku as a potential candidate for the above listed study. This Clinical Research Nurse met with Rhonda Richardson, [id number], on 09/09/21 in a manner and location that ensures patient privacy to discuss participation in the above listed research study. Patient is Accompanied by her son . A copy of the informed consent document with embedded HIPAA language was provided to the patient. Patient reads, speaks, and understands English.    Patient was provided with the business card of this Nurse and encouraged to contact the research team with any questions. Approximately 20 minutes were spent with the patient reviewing the informed consent documents. Patient was provided the option of taking informed consent documents home to review and was encouraged to review at their convenience with their support network, including other care providers. Patient took the consent documents home to review.  Patient stated that she would like to participate, and requested an appointment tomorrow, 09/10/21. Research and lab appointments scheduled. Patient thanked for her time and encouraged to call with any questions in the meantime.  Leah Cagwin, RN, BSN, CPN Clinical Research Nurse I [phone]  09/09/2021 12:59 PM  

## 2021-09-09 NOTE — Assessment & Plan Note (Signed)
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  She currently has 2 masses located at 9:30 position and 10 o'clock position each measuring 1.5 and 1.4 cm respectively.  No abnormal appearing/ enlarged lymphadenopathy. Although she has good PS, she currently is 82 and may not be a candidate for aggressive chemotherapy.  She is a physically well but appears to have been significantly overwhelmed with all the information.  She was more keen to proceed with surgery first followed by chemotherapy and Herceptin based therapy. Although she does not have strong PR positivity, given her age and no evidence of lymph node involvement, if the final pathology shows tumor not significantly larger than 3 cm, we could consider Taxol and Herceptin adjuvant.  She will proceed with weekly Taxol and Herceptin for the first 12 weeks and then proceed with Herceptin alone for the rest of the year.  I have repeated this information multiple times today.  She understands that sometimes we consider neoadjuvant chemotherapy which will give [place] prognostic information as well as allows [place] to change medication based on pathologic response but in her case we all agreed that it is better to consider adjuvant therapy. After she completes radiation, she will be a candidate for antiestrogen therapy given strong ER positivity.  We did not get a chance to talk about antiestrogen therapy since the patient had multiple questions and looked overwhelmed with all the information we have discussed today. We have briefly discussed about adverse effects of chemotherapy including but not limited to fatigue, nausea, cytopenias, neuropathy as well as cardiotoxicity from Herceptin. She will return to clinic after surgery to discuss once again about adjuvant recommendations. All her  questions were answered to the best of my knowledge. At this time she does not qualify for genetic testing but if she wishes to pursue this, this can certainly be arranged.    Thank you for consulting [place] the care of this patient.  Please do not hesitate to contact us with any additional questions or concerns.

## 2021-09-09 NOTE — Progress Notes (Signed)
Radiation Oncology         [phone] ________________________________  Name: Rhonda Richardson        MRN: [id number]  Date of Service: 09/09/2021 DOB: 01/18/1941  [id number], Rhonda Sheffield, MD  [name], MD     REFERRING PHYSICIAN: [name], MD   DIAGNOSIS: The encounter diagnosis was Malignant neoplasm of upper-outer quadrant of right breast in female, estrogen receptor positive ([place]).   HISTORY OF PRESENT ILLNESS: Rhonda Richardson is a 81 y.o. female seen in the multidisciplinary breast clinic for a new diagnosis of right breast cancer. The patient was noted to have a screening detected mass in the right breast. Further diagnostic imaging showed a 1.5 cm mass in the 9:30 position and also a 1.4 cm mass in the 10:00 position, her right axilla was negative for adenopathy. 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 Her cancer was triple positive with a Ki 67 of 30%. She's seen today to discuss treatment recommendations of her cancer.     PREVIOUS RADIATION THERAPY: No   PAST MEDICAL HISTORY:  Past Medical History:  Diagnosis Date   Hypertension        PAST SURGICAL HISTORY: Past Surgical History:  Procedure Laterality Date   cataract surgery Bilateral    DILATION AND CURETTAGE OF UTERUS  1970   eyelid tendon repair Bilateral      FAMILY HISTORY:  Family History  Problem Relation Age of Onset   Heart disease Father 72     SOCIAL HISTORY:  reports that she has never smoked. She does not have any smokeless tobacco history on file. She reports current alcohol use. She reports that she does not use drugs. The patient is married and lives in [place]. She works part time at [employer].     ALLERGIES: Macrodantin [nitrofurantoin]   MEDICATIONS:  Current Outpatient Medications  Medication Sig Dispense Refill   benazepril (LOTENSIN) 20 MG tablet Take by mouth.     cholecalciferol (VITAMIN D3) 25 MCG  (1000 UNIT) tablet Take 1,000 Units by mouth daily.     hydrochlorothiazide (MICROZIDE) 12.5 MG capsule Take by mouth.     Red Yeast Rice 600 MG CAPS Take 2 capsules by mouth.     Vitamins-Lipotropics (MULTI-VITAMIN HP/MINERALS PO) Take 1 tablet by mouth daily.     No current facility-administered medications for this encounter.     REVIEW OF SYSTEMS: On review of systems, the patient reports that she is doing well overall and no breast specific complaints are verbalized.      PHYSICAL EXAM:  Wt Readings from Last 3 Encounters:  09/09/21 142 lb 4.8 oz (64.5 kg)   Temp Readings from Last 3 Encounters:  09/09/21 (!) 97.5 F (36.4 C)   BP Readings from Last 3 Encounters:  09/09/21 (!) 163/89   Pulse Readings from Last 3 Encounters:  09/09/21 100    In general this is a well appearing caucasian female in no acute distress. She's alert and oriented x4 and appropriate throughout the examination. Cardiopulmonary assessment is negative for acute distress and she exhibits normal effort. Bilateral breast exam is deferred.    ECOG = 0  0 - Asymptomatic (Fully active, able to carry on all predisease activities without restriction)  1 - Symptomatic but completely ambulatory (Restricted in physically strenuous activity but ambulatory and able to carry out work of a light or sedentary nature. For example, light housework, office work)  2 -  Symptomatic, <50% in bed during the day (Ambulatory and capable of all self care but unable to carry out any work activities. Up and about more than 50% of waking hours)  3 - Symptomatic, >50% in bed, but not bedbound (Capable of only limited self-care, confined to bed or chair 50% or more of waking hours)  4 - Bedbound (Completely disabled. Cannot carry on any self-care. Totally confined to bed or chair)  5 - Death   Eustace Pen MM, Creech RH, Tormey DC, et al. [phone]). "Toxicity and response criteria of the [facility]". R[place]  Oncol. 5 (6): 649-55    LABORATORY DATA:  Lab Results  Component Value Date   WBC 4.3 09/09/2021   HGB 12.5 09/09/2021   HCT 38.3 09/09/2021   MCV 81.7 09/09/2021   PLT 249 09/09/2021   Lab Results  Component Value Date   NA 138 09/09/2021   K 3.6 09/09/2021   CL 103 09/09/2021   CO2 30 09/09/2021   Lab Results  Component Value Date   ALT 18 09/09/2021   AST 17 09/09/2021   ALKPHOS 54 09/09/2021   BILITOT 0.5 09/09/2021      RADIOGRAPHY: US BREAST LTD UNI RIGHT INC AXILLA  Result Date: 08/25/2021 CLINICAL DATA:  81 year old female for further evaluation of possible RIGHT breast mass on screening mammogram. EXAM: DIGITAL DIAGNOSTIC UNILATERAL RIGHT MAMMOGRAM WITH TOMOSYNTHESIS AND CAD; ULTRASOUND RIGHT BREAST LIMITED TECHNIQUE: Right digital diagnostic mammography and breast tomosynthesis was performed. The images were evaluated with computer-aided detection.; Targeted ultrasound examination of the right breast was performed COMPARISON:  Previous exam(s). ACR Breast Density Category b: There are scattered areas of fibroglandular density. FINDINGS: 2D/3D spot compression views of the RIGHT breast demonstrate a 3.5 cm area spiculated masses/distortions within the UPPER-OUTER RIGHT breast. Heterogeneous calcifications are noted within the posterior aspect of this area. On physical exam, mild thickening is identified at the 10 o'clock position of the RIGHT breast 4 cm from the nipple. Targeted ultrasound of the RIGHT breast is performed, showing the following: A 0.6 x 0.7 x 1.5 cm irregular hypoechoic mass the 9:30 position 4 cm from the nipple. A 1.4 x 1.4 x 1.2 cm ill-defined hypoechoic mass at the 10:30 position of the RIGHT breast 4 cm from the nipple. The 2 above masses are separated by a distance of 0.5 cm. No abnormal RIGHT axillary lymph nodes are noted. IMPRESSION: 1. Two highly suspicious UPPER-OUTER RIGHT breast masses, measuring 1.5 cm at the 9:30 position and 1.4 cm at the 10  o'clock position. The masses/distortions mammographically span a distance of up to 3.5 cm. 2. No abnormal appearing RIGHT axillary lymph nodes. RECOMMENDATION: Ultrasound-guided biopsies of both UPPER OUTER RIGHT breast masses, which will be scheduled. I have discussed the findings and recommendations with the patient. If applicable, a reminder letter will be sent to the patient regarding the next appointment. BI-RADS CATEGORY  5: Highly suggestive of malignancy. Electronically Signed   By: Margarette Canada M.D.   On: 08/25/2021 14:02  MM DIAG BREAST TOMO UNI RIGHT  Result Date: 08/25/2021 CLINICAL DATA:  81 year old female for further evaluation of possible RIGHT breast mass on screening mammogram. EXAM: DIGITAL DIAGNOSTIC UNILATERAL RIGHT MAMMOGRAM WITH TOMOSYNTHESIS AND CAD; ULTRASOUND RIGHT BREAST LIMITED TECHNIQUE: Right digital diagnostic mammography and breast tomosynthesis was performed. The images were evaluated with computer-aided detection.; Targeted ultrasound examination of the right breast was performed COMPARISON:  Previous exam(s). ACR Breast Density Category b: There are scattered areas of  fibroglandular density. FINDINGS: 2D/3D spot compression views of the RIGHT breast demonstrate a 3.5 cm area spiculated masses/distortions within the UPPER-OUTER RIGHT breast. Heterogeneous calcifications are noted within the posterior aspect of this area. On physical exam, mild thickening is identified at the 10 o'clock position of the RIGHT breast 4 cm from the nipple. Targeted ultrasound of the RIGHT breast is performed, showing the following: A 0.6 x 0.7 x 1.5 cm irregular hypoechoic mass the 9:30 position 4 cm from the nipple. A 1.4 x 1.4 x 1.2 cm ill-defined hypoechoic mass at the 10:30 position of the RIGHT breast 4 cm from the nipple. The 2 above masses are separated by a distance of 0.5 cm. No abnormal RIGHT axillary lymph nodes are noted. IMPRESSION: 1. Two highly suspicious UPPER-OUTER RIGHT breast  masses, measuring 1.5 cm at the 9:30 position and 1.4 cm at the 10 o'clock position. The masses/distortions mammographically span a distance of up to 3.5 cm. 2. No abnormal appearing RIGHT axillary lymph nodes. RECOMMENDATION: Ultrasound-guided biopsies of both UPPER OUTER RIGHT breast masses, which will be scheduled. I have discussed the findings and recommendations with the patient. If applicable, a reminder letter will be sent to the patient regarding the next appointment. BI-RADS CATEGORY  5: Highly suggestive of malignancy. Electronically Signed   By: Margarette Canada M.D.   On: 08/25/2021 14:02  MM 3D SCREEN BREAST BILATERAL  Result Date: 08/12/2021 CLINICAL DATA:  Screening. EXAM: DIGITAL SCREENING BILATERAL MAMMOGRAM WITH TOMOSYNTHESIS AND CAD TECHNIQUE: Bilateral screening digital craniocaudal and mediolateral oblique mammograms were obtained. Bilateral screening digital breast tomosynthesis was performed. The images were evaluated with computer-aided detection. COMPARISON:  Previous exam(s). ACR Breast Density Category b: There are scattered areas of fibroglandular density. FINDINGS: In the right breast, a possible mass warrants further evaluation. In the left breast, no findings suspicious for malignancy. IMPRESSION: Further evaluation is suggested for a possible mass in the right breast. RECOMMENDATION: Diagnostic mammogram and possibly ultrasound of the right breast. (Code:[id number]-R-35M) The patient will be contacted regarding the findings, and additional imaging will be scheduled. BI-RADS CATEGORY  0: Incomplete. Need additional imaging evaluation and/or prior mammograms for comparison. Electronically Signed   By: Margarette Canada M.D.   On: 08/12/2021 16:27   MM CLIP PLACEMENT RIGHT  Result Date: 08/31/2021 CLINICAL DATA:  81 year old female with 2 suspicious right breast masses. EXAM: 3D DIAGNOSTIC RIGHT MAMMOGRAM POST ULTRASOUND BIOPSY COMPARISON:  Previous exam(s). FINDINGS: 3D Mammographic images were  obtained following 2 area ultrasound guided biopsy of the right breast. The ribbon shaped marking clip has migrated approximately 5 mm from the center of the biopsied mass. The coil shaped clip is in the appropriate position. IMPRESSION: 1. 5 mm medial migration of the ribbon shaped clip from the center of the biopsied mass. 2. Appropriate positioning of the coil shaped clip. Final Assessment: Post Procedure Mammograms for Marker Placement Electronically Signed   By: Kristopher Oppenheim M.D.   On: 08/31/2021 08:53  [place] RT BREAST BX W LOC DEV 1ST LESION IMG BX SPEC US GUIDE  Addendum Date: 09/02/2021   ADDENDUM REPORT: 09/02/2021 13:02 ADDENDUM: Pathology revealed GRADE II INVASIVE DUCTAL CARCINOMA of the RIGHT breast, 9:30 o'clock, 4cmfn (ribbon clip). This was found to be concordant by Dr. Kristopher Oppenheim. Pathology revealed GRADE II INVASIVE DUCTAL CARCINOMA of the RIGHT breast, 10 o'clock, 4cmfn (coil clip). This was found to be concordant by Dr. Kristopher Oppenheim. Pathology results were discussed with the patient by telephone. The patient reported doing well after the  biopsies with tenderness at the sites. Post biopsy instructions and care were reviewed and questions were answered. The patient was encouraged to call [facility] for any additional concerns. My direct phone number was provided. The patient was referred to [facility] on Sep 09, 2021. Pathology results reported by Terie Purser, RN on 09/02/2021. Electronically Signed   By: Kristopher Oppenheim M.D.   On: 09/02/2021 13:02   Result Date: 09/02/2021 CLINICAL DATA:  81 year old female with 2 suspicious right breast masses. EXAM: ULTRASOUND GUIDED RIGHT BREAST CORE NEEDLE BIOPSY COMPARISON:  None Available. PROCEDURE: I met with the patient and we discussed the procedure of ultrasound-guided biopsy, including benefits and alternatives. We discussed the high  likelihood of a successful procedure. We discussed the risks of the procedure, including infection, bleeding, tissue injury, clip migration, and inadequate sampling. Informed written consent was given. The usual time-out protocol was performed immediately prior to the procedure. Lesion quadrant: Upper outer quadrant Using sterile technique and 1% Lidocaine as local anesthetic, under direct ultrasound visualization, a 14 gauge spring-loaded device was used to perform biopsy of a mass at the 9:30 position using a lateral approach. At the conclusion of the procedure a ribbon shaped shaped tissue marker clip was deployed into the biopsy cavity. Lesion quadrant: Upper outer quadrant Using sterile technique and 1% Lidocaine as local anesthetic, under direct ultrasound visualization, a 14 gauge spring-loaded device was used to perform biopsy of a mass at the 10 o'clock position using a lateral approach. At the conclusion of the procedure a coil shaped shaped tissue marker clip was deployed into the biopsy cavity. Follow up 2 view mammogram was performed and dictated separately. IMPRESSION: Ultrasound guided biopsy of the right breast x2. No apparent complications. Electronically Signed: By: Kristopher Oppenheim M.D. On: 08/31/2021 08:54  [place] RT BREAST BX W LOC DEV EA ADD LESION IMG BX SPEC US GUIDE  Addendum Date: 09/02/2021   ADDENDUM REPORT: 09/02/2021 13:02 ADDENDUM: Pathology revealed GRADE II INVASIVE DUCTAL CARCINOMA of the RIGHT breast, 9:30 o'clock, 4cmfn (ribbon clip). This was found to be concordant by Dr. Kristopher Oppenheim. Pathology revealed GRADE II INVASIVE DUCTAL CARCINOMA of the RIGHT breast, 10 o'clock, 4cmfn (coil clip). This was found to be concordant by Dr. Kristopher Oppenheim. Pathology results were discussed with the patient by telephone. The patient reported doing well after the biopsies with tenderness at the sites. Post biopsy instructions and care were reviewed and questions were answered. The patient was  encouraged to call [facility] for any additional concerns. My direct phone number was provided. The patient was referred to [facility] on Sep 09, 2021. Pathology results reported by Terie Purser, RN on 09/02/2021. Electronically Signed   By: Kristopher Oppenheim M.D.   On: 09/02/2021 13:02   Result Date: 09/02/2021 CLINICAL DATA:  81 year old female with 2 suspicious right breast masses. EXAM: ULTRASOUND GUIDED RIGHT BREAST CORE NEEDLE BIOPSY COMPARISON:  None Available. PROCEDURE: I met with the patient and we discussed the procedure of ultrasound-guided biopsy, including benefits and alternatives. We discussed the high likelihood of a successful procedure. We discussed the risks of the procedure, including infection, bleeding, tissue injury, clip migration, and inadequate sampling. Informed written consent was given. The usual time-out protocol was performed immediately prior to the procedure. Lesion quadrant: Upper outer quadrant Using sterile technique and 1% Lidocaine as  local anesthetic, under direct ultrasound visualization, a 14 gauge spring-loaded device was used to perform biopsy of a mass at the 9:30 position using a lateral approach. At the conclusion of the procedure a ribbon shaped shaped tissue marker clip was deployed into the biopsy cavity. Lesion quadrant: Upper outer quadrant Using sterile technique and 1% Lidocaine as local anesthetic, under direct ultrasound visualization, a 14 gauge spring-loaded device was used to perform biopsy of a mass at the 10 o'clock position using a lateral approach. At the conclusion of the procedure a coil shaped shaped tissue marker clip was deployed into the biopsy cavity. Follow up 2 view mammogram was performed and dictated separately. IMPRESSION: Ultrasound guided biopsy of the right breast x2. No apparent complications. Electronically Signed: By: Kristopher Oppenheim  M.D. On: 08/31/2021 08:54      IMPRESSION/PLAN: 1. 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 Dr. Lisbeth Renshaw discusses the pathology findings and reviews the nature of right breast disease. The consensus from the breast conference includes breast conservation with lumpectomy with sentinel node biopsy. Dr. Chryl Heck plans to offer antiHER2 therapy with antiestrogen, possibly chemotherapy depending on her performance status. Dr. Lisbeth Renshaw recommends adjuvant external radiotherapy to the breast  to reduce risks of local recurrence. We discussed the risks, benefits, short, and long term effects of radiotherapy, as well as the curative intent, and the patient is interested in proceeding. Dr. Lisbeth Renshaw discusses the delivery and logistics of radiotherapy and anticipates a course of 4 or up to 6 1/2 weeks of radiotherapy to the right breast. We will see her back a few weeks after surgery or after completion of any chemotherapy to discuss the simulation process and anticipate we starting radiotherapy about 4-6 weeks after surgery.  2. Possible genetic predisposition to malignancy. The patient is a candidate for genetic testing given her personal  history. She was offered referral and will meet genetics today.   In a visit lasting 60 minutes, greater than 50% of the time was spent face to face reviewing her case, as well as in preparation of, discussing, and coordinating the patient's care.  The above documentation reflects my direct findings during this shared patient visit. Please see the separate note by Dr. Lisbeth Renshaw on this date for the remainder of the patient's plan of care.    Carola Rhine, [facility]    **Disclaimer: This note was dictated with voice recognition software. Similar sounding words can inadvertently be transcribed and this note may contain transcription errors which may not have been corrected upon publication of note.**

## 2021-09-09 NOTE — Progress Notes (Signed)
[place] Work  Initial Assessment   Rhonda Richardson is a 81 y.o. year old female accompanied by son, Rhonda Richardson. Clinical Social Work was referred by  [facility]  for assessment of psychosocial needs.   SDOH (Social Determinants of Health) assessments performed: Yes SDOH Interventions    Flowsheet Row Most Recent Value  SDOH Interventions   Food Insecurity Interventions Intervention Not Indicated  Financial Strain Interventions Intervention Not Indicated  Housing Interventions Intervention Not Indicated  Transportation Interventions Intervention Not Indicated       SDOH Screenings   Alcohol Screen: Not on file  Depression (PHQ2-9): Not on file  Financial Resource Strain: Low Risk    Difficulty of Paying Living Expenses: Not hard at all  Food Insecurity: No Food Insecurity   Worried About Charity fundraiser in the Last Year: Never true   Arboriculturist in the Last Year: Never true  Housing: Low Risk    Last Housing Risk Score: 0  Physical Activity: Not on file  Social Connections: Not on file  Stress: Not on file  Tobacco Use: Unknown   Smoking Tobacco Use: Never   Smokeless Tobacco Use: Unknown   Passive Exposure: Not on file  Transportation Needs: No Transportation Needs   Lack of Transportation (Medical): No   Lack of Transportation (Non-Medical): No     Distress Screen completed: Yes    09/09/2021   12:31 PM  ONCBCN DISTRESS SCREENING  Screening Type Initial Screening  Distress experienced in past week (1-10) 1  Practical problem type Insurance  Information Concerns Type Lack of info about diagnosis;Lack of info about treatment;Lack of info about complementary therapy choices      Family/Social Information:  Housing Arrangement: patient lives with husband, Rhonda Richardson members/support persons in your life? Family (daughter, Rhonda Richardson, and son, Rhonda Richardson, in [place], other son Rhonda Richardson in [place]) Transportation concerns: no  Employment: Working part time as a Scientist, clinical (histocompatibility and immunogenetics) at  [employer].  Income source: Employment and Paediatric nurse concerns: No Type of concern: None Food access concerns: no Religious or spiritual practice: Not known Services Currently in place:  n/a  Coping/ Adjustment to diagnosis: Patient understands treatment plan and what happens next? yes Concerns about diagnosis and/or treatment:  Adjusting to diagnosis Patient reported stressors:  processing treatment plan Patient enjoys time with family/ friends and going out to eat, movies, sporting events ([employer] football) Current coping skills/ strengths: Capable of independent living , Motivation for treatment/growth , Special hobby/interest , and Supportive family/friends     SUMMARY: Current SDOH Barriers:  No significant SDOH barriers at this time  Clinical Social Work Clinical Goal(s):  No clinical social work goals at this time  Interventions: Discussed common feeling and emotions when being diagnosed with cancer, and the importance of support during treatment Informed patient of the support team roles and support services at [facility] Provided [place] contact information and encouraged patient to call with any questions or concerns   Follow Up Plan: Patient will contact CSW with any support or resource needs Patient verbalizes understanding of plan: Yes    Kodie Pick E Lankford Gutzmer, LCSW

## 2021-09-09 NOTE — Progress Notes (Signed)
[place] NOTE  Patient Care Team: Kelton Pillar, MD as PCP - General (Family Medicine) [name], MD as Consulting Physician (General Surgery) Benay Pike, MD as Consulting Physician (Hematology and Oncology) Kyung Rudd, MD as Consulting Physician (Radiation Oncology) Mauro Kaufmann, RN as Oncology Nurse Navigator Rockwell Germany, RN as Oncology Nurse Navigator  CHIEF COMPLAINTS/PURPOSE OF CONSULTATION:  Newly diagnosed breast cancer  HISTORY OF PRESENTING ILLNESS:  Rhonda Richardson 81 y.o. female is here because of recent diagnosis of right breast cancer.  I reviewed her records extensively and collaborated the history with the patient.  SUMMARY OF ONCOLOGIC HISTORY: Oncology History  Malignant neoplasm of upper-outer quadrant of right breast in female, estrogen receptor positive ([place])  08/12/2021 Mammogram   Mammogram showed possible mass in the right breast.  Diagnostic mammogram showed 2 highly suspicious upper outer right breast masses measuring 1.5 cm in the 9:30 position and 1.4 cm at the 10 o'clock position.  These masses or distortions mammographically spanning a distance of up to 3.5 cm.  No abnormal appearing right axillary lymph nodes.   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  Prognostic showed ER 99% positive strong staining PR 10% positive moderate staining, HER2 positive 3+ and Ki-67 of 30%.   09/08/2021 Initial Diagnosis   Malignant neoplasm of upper-outer quadrant of right breast in female, estrogen receptor positive ([place])    She arrived to the appointment with her youngest son today. She says she is very healthy, takes only 2 medication, rest is supplements or OTC. She had a screening mammogram 2 yrs ago, this was normal.  No FH of any cancers She still works part time at the [employer], mostly clerical work. Rest of the pertinent 10 point ROS  reviewed and negative.  MEDICAL HISTORY:  Past Medical History:  Diagnosis Date   Hypertension     SURGICAL HISTORY: Past Surgical History:  Procedure Laterality Date   cataract surgery Bilateral    DILATION AND CURETTAGE OF UTERUS  1970   eyelid tendon repair Bilateral     SOCIAL HISTORY: Social History   Socioeconomic History   Marital status: Married    Spouse name: Not on file   Number of children: Not on file   Years of education: Not on file   Highest education level: Not on file  Occupational History   Not on file  Tobacco Use   Smoking status: Never   Smokeless tobacco: Not on file  Substance and Sexual Activity   Alcohol use: Yes    Comment: 3- 4 drinks   Drug use: Never   Sexual activity: Not on file  Other Topics Concern   Not on file  Social History Narrative   Not on file   Social Determinants of Health   Financial Resource Strain: Low Risk    Difficulty of Paying Living Expenses: Not hard at all  Food Insecurity: No Food Insecurity   Worried About Charity fundraiser in the Last Year: Never true   [place] in the Last Year: Never true  Transportation Needs: No Transportation Needs   Lack of Transportation (Medical): No   Lack of Transportation (Non-Medical): No  Physical Activity: Not on file  Stress: Not on file  Social Connections: Not on file  Intimate Partner Violence: Not on file    FAMILY HISTORY: Family History  Problem Relation Age of Onset  Heart disease Father 58    ALLERGIES:  is allergic to macrodantin [nitrofurantoin].  MEDICATIONS:  Current Outpatient Medications  Medication Sig Dispense Refill   benazepril (LOTENSIN) 20 MG tablet Take by mouth.     cholecalciferol (VITAMIN D3) 25 MCG (1000 UNIT) tablet Take 1,000 Units by mouth daily.     hydrochlorothiazide (MICROZIDE) 12.5 MG capsule Take by mouth.     Red Yeast Rice 600 MG CAPS Take 2 capsules by mouth.     Vitamins-Lipotropics (MULTI-VITAMIN HP/MINERALS  PO) Take 1 tablet by mouth daily.     No current facility-administered medications for this visit.    REVIEW OF SYSTEMS:   Constitutional: Denies fevers, chills or abnormal night sweats Eyes: Denies blurriness of vision, double vision or watery eyes Ears, nose, mouth, throat, and face: Denies mucositis or sore throat Respiratory: Denies cough, dyspnea or wheezes Cardiovascular: Denies palpitation, chest discomfort or lower extremity swelling Gastrointestinal:  Denies nausea, heartburn or change in bowel habits Skin: Denies abnormal skin rashes Lymphatics: Denies new lymphadenopathy or easy bruising Neurological:Denies numbness, tingling or new weaknesses Behavioral/Psych: Mood is stable, no new changes  Breast: Denies any palpable lumps or discharge All other systems were reviewed with the patient and are negative.  PHYSICAL EXAMINATION: ECOG PERFORMANCE STATUS: 0 - Asymptomatic  Vitals:   09/09/21 0844  BP: (!) 163/89  Pulse: 100  Resp: 18  Temp: (!) 97.5 F (36.4 C)  SpO2: 99%   Filed Weights   09/09/21 0844  Weight: 142 lb 4.8 oz (64.5 kg)    GENERAL:alert, no distress and comfortable SKIN: skin color, texture, turgor are normal, no rashes or significant lesions EYES: normal, conjunctiva are pink and non-injected, sclera clear OROPHARYNX:no exudate, no erythema and lips, buccal mucosa, and tongue normal  NECK: supple, thyroid normal size, non-tender, without nodularity LYMPH:  no palpable lymphadenopathy in the cervical, axillary or inguinal LUNGS: clear to auscultation and percussion with normal breathing effort HEART: regular rate & rhythm and no murmurs and no lower extremity edema ABDOMEN:abdomen soft, non-tender and normal bowel sounds Musculoskeletal:no cyanosis of digits and no clubbing  PSYCH: alert & oriented x 3 with fluent speech NEURO: no focal motor/sensory deficits BREAST: Palpable breast mass measuring 3-4 cms on exam in the area of biopsy in the  upper outer right breast. No palpable regional lymphadenopathy  LABORATORY DATA:  I have reviewed the data as listed Lab Results  Component Value Date   WBC 4.3 09/09/2021   HGB 12.5 09/09/2021   HCT 38.3 09/09/2021   MCV 81.7 09/09/2021   PLT 249 09/09/2021   Lab Results  Component Value Date   NA 138 09/09/2021   K 3.6 09/09/2021   CL 103 09/09/2021   CO2 30 09/09/2021    RADIOGRAPHIC STUDIES: I have personally reviewed the radiological reports and agreed with the findings in the report.  ASSESSMENT AND PLAN:  Malignant neoplasm of upper-outer quadrant of right breast in female, estrogen receptor positive (HCC) This is a very pleasant 80-year-old female patient with past medical history significant for hypertension, otherwise healthy referred to breast [facility] with 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  She currently has 2 masses located at 9:30 position and 10 o'clock position each measuring 1.5 and 1.4 cm respectively.  No abnormal appearing/ enlarged lymphadenopathy. Although she has good PS, she currently is 80 and may not be a candidate for aggressive chemotherapy.  She is a physically well but appears to have been significantly overwhelmed   with all the information.  She was more keen to proceed with surgery first followed by chemotherapy and Herceptin based therapy. Although she does not have strong PR positivity, given her age and no evidence of lymph node involvement, if the final pathology shows tumor not significantly larger than 3 cm, we could consider Taxol and Herceptin adjuvant.  She will proceed with weekly Taxol and Herceptin for the first 12 weeks and then proceed with Herceptin alone for the rest of the year.  I have repeated this information multiple times today.  She understands that sometimes we consider neoadjuvant chemotherapy which will give us prognostic information as well as allows us to change medication based on pathologic response  but in her case we all agreed that it is better to consider adjuvant therapy. After she completes radiation, she will be a candidate for antiestrogen therapy given strong ER positivity.  We did not get a chance to talk about antiestrogen therapy since the patient had multiple questions and looked overwhelmed with all the information we have discussed today. We have briefly discussed about adverse effects of chemotherapy including but not limited to fatigue, nausea, cytopenias, neuropathy as well as cardiotoxicity from Herceptin. She will return to clinic after surgery to discuss once again about adjuvant recommendations. All her questions were answered to the best of my knowledge. At this time she does not qualify for genetic testing but if she wishes to pursue this, this can certainly be arranged.    Thank you for consulting us the care of this patient.  Please do not hesitate to contact us with any additional questions or concerns.  Total time spent: 60 minutes including history, physical, review of records, counseling and coordination of care All questions were answered. The patient knows to call the clinic with any problems, questions or concerns.    Praveena Iruku, MD 09/09/21  

## 2021-09-09 NOTE — Therapy (Signed)
OUTPATIENT PHYSICAL THERAPY BREAST CANCER BASELINE EVALUATION   Patient Name: Rhonda Richardson MRN: [id number] DOB:07/01/1940, 81 y.o., female Today's Date: 09/09/2021   PT End of Session - 09/09/21 1242     Visit Number 1    Number of Visits 2    Date for PT Re-Evaluation 11/04/21    PT Start Time 1116    PT Stop Time 1139   Also saw pt from 1158-1211 for a total of 36 min   PT Time Calculation (min) 23 min    Activity Tolerance Patient tolerated treatment well    Behavior During Therapy WFL for tasks assessed/performed             Past Medical History:  Diagnosis Date   Hypertension    Past Surgical History:  Procedure Laterality Date   cataract surgery Bilateral    DILATION AND CURETTAGE OF UTERUS  1970   eyelid tendon repair Bilateral    Patient Active Problem List   Diagnosis Date Noted   Malignant neoplasm of upper-outer quadrant of right breast in female, estrogen receptor positive (HCC) 09/08/2021    PCP: Dr. Elaine Griffin  REFERRING PROVIDER: Dr. Faera Byerly  REFERRING DIAG: Right breast cancer  THERAPY DIAG:  Malignant neoplasm of upper-outer quadrant of right breast in female, estrogen receptor positive (HCC)  Abnormal posture  Rationale for Evaluation and Treatment Rehabilitation  ONSET DATE: 08/12/2021  SUBJECTIVE                                                                                                                                                                                           SUBJECTIVE STATEMENT: Patient reports she is here today to be seen by her medical team for her newly diagnosed right breast cancer.   PERTINENT HISTORY:  Patient was diagnosed on 08/12/2021 with right grade 2 invasive ductal carcinoma breast cancer. It measures 1.5 and 1.4 cm and is located in the upper outer quadrant. It is triple positive with a Ki67 of 30%.   PATIENT GOALS   reduce lymphedema risk and learn post op HEP.   PAIN:  Are you having  pain? Yes: NPRS scale: varies but none at rest/10 Pain location: left dorsal foot Pain description: achy Aggravating factors: walking Relieving factors: rest   PRECAUTIONS: Active CA   HAND DOMINANCE: right  WEIGHT BEARING RESTRICTIONS No  FALLS:  Has patient fallen in last 6 months? No  LIVING ENVIRONMENT: Patient lives with: her husband Lives in: House/apartment Has following equipment at home: None  OCCUPATION: She works part time at [employer]  LEISURE: She does not exercise  PRIOR LEVEL OF   FUNCTION: Independent   OBJECTIVE  COGNITION:  Overall cognitive status: Within functional limits for tasks assessed    POSTURE:  Forward head and rounded shoulders posture  UPPER EXTREMITY AROM/PROM:  A/PROM RIGHT   eval   Shoulder extension 59  Shoulder flexion 140  Shoulder abduction 158  Shoulder internal rotation 46  Shoulder external rotation 82    (Blank rows = not tested)  A/PROM LEFT   eval  Shoulder extension 59  Shoulder flexion 150  Shoulder abduction 177  Shoulder internal rotation 65  Shoulder external rotation 90    (Blank rows = not tested)   CERVICAL AROM: All within normal limits:    Percent limited  Flexion WNL  Extension WNL  Right lateral flexion WNL  Left lateral flexion 25% limited  Right rotation 25% limited  Left rotation WNL     UPPER EXTREMITY STRENGTH: WFL   LYMPHEDEMA ASSESSMENTS:   LANDMARK RIGHT   eval  10 cm proximal to olecranon process 26  Olecranon process 23.1  10 cm proximal to ulnar styloid process 19.2  Just proximal to ulnar styloid process 14.4  Across hand at thumb web space 17.8  At base of 2nd digit 5.8  (Blank rows = not tested)  LANDMARK LEFT   eval  10 cm proximal to olecranon process 26.1  Olecranon process 22.9  10 cm proximal to ulnar styloid process 19.3  Just proximal to ulnar styloid process 14.2  Across hand at thumb web space 17.4  At base of 2nd digit 5.8  (Blank rows =  not tested)   L-DEX LYMPHEDEMA SCREENING:  The patient was assessed using the L-Dex machine today to produce a lymphedema index baseline score. The patient will be reassessed on a regular basis (typically every 3 months) to obtain new L-Dex scores. If the score is > 6.5 points away from his/her baseline score indicating onset of subclinical lymphedema, it will be recommended to wear a compression garment for 4 weeks, 12 hours per day and then be reassessed. If the score continues to be > 6.5 points from baseline at reassessment, we will initiate lymphedema treatment. Assessing in this manner has a 95% rate of preventing clinically significant lymphedema.   L-DEX FLOWSHEETS - 09/09/21 1200       L-DEX LYMPHEDEMA SCREENING   Measurement Type Unilateral    L-DEX MEASUREMENT EXTREMITY Upper Extremity    POSITION  Standing    DOMINANT SIDE Right    At Risk Side Right    BASELINE SCORE (UNILATERAL) -1.9              QUICK DASH SURVEY:   PATIENT EDUCATION:  Education details: Lymphedema risk reduction and post op shoulder/posture HEP Person educated: Patient Education method: Explanation, Demonstration, Handout Education comprehension: Patient verbalized understanding and returned demonstration   HOME EXERCISE PROGRAM: Patient was instructed today in a home exercise program today for post op shoulder range of motion. These included active assist shoulder flexion in sitting, scapular retraction, wall walking with shoulder abduction, and hands behind head external rotation.  She was encouraged to do these twice a day, holding 3 seconds and repeating 5 times when permitted by her physician.   ASSESSMENT:  CLINICAL IMPRESSION: Patient was diagnosed on 08/12/2021 with right grade 2 invasive ductal carcinoma breast cancer. It measures 1.5 and 1.4 cm and is located in the upper outer quadrant. It is triple positive with a Ki67 of 30%. Her multidisciplinary medical team met prior to her  assessments to determine   a recommended treatment plan. She is planning to have a right lumpectomy and sentinel node biopsy followed by chemotherapy, radiation, and anti-estrogen therapy. She will benefit from a post op PT reassessment to determine needs and from L-Dex screens every 3 months for 2 years to detect subclinical lymphedema.  Pt will benefit from skilled therapeutic intervention to improve on the following deficits: Decreased knowledge of precautions, impaired UE functional use, pain, decreased ROM, postural dysfunction.   PT treatment/interventions: ADL/self-care home management, pt/family education, therapeutic exercise  REHAB POTENTIAL: Excellent  CLINICAL DECISION MAKING: Stable/uncomplicated  EVALUATION COMPLEXITY: Low   GOALS: Goals reviewed with patient? YES  LONG TERM GOALS: (STG=LTG)    Name Target Date Goal status  1 Pt will be able to verbalize understanding of pertinent lymphedema risk reduction practices relevant to her dx specifically related to skin care.  Baseline:  No knowledge 09/09/2021 Achieved at eval  2 Pt will be able to return demo and/or verbalize understanding of the post op HEP related to regaining shoulder ROM. Baseline:  No knowledge 09/09/2021 Achieved at eval  3 Pt will be able to verbalize understanding of the importance of attending the post op After Breast CA Class for further lymphedema risk reduction education and therapeutic exercise.  Baseline:  No knowledge 09/09/2021 Achieved at eval  4 Pt will demo she has regained full shoulder ROM and function post operatively compared to baselines.  Baseline: See objective measurements taken today. 11/04/2021      PLAN: PT FREQUENCY/DURATION: EVAL and 1 follow up appointment.   PLAN FOR NEXT SESSION: will reassess 3-4 weeks post op to determine needs.   Patient will follow up at outpatient cancer rehab 3-4 weeks following surgery.  If the patient requires physical therapy at that time, a specific  plan will be dictated and sent to the referring physician for approval. The patient was educated today on appropriate basic range of motion exercises to begin post operatively and the importance of attending the After Breast Cancer class following surgery.  Patient was educated today on lymphedema risk reduction practices as it pertains to recommendations that will benefit the patient immediately following surgery.  She verbalized good understanding.    Physical Therapy Information for After Breast Cancer Surgery/Treatment:  Lymphedema is a swelling condition that you may be at risk for in your arm if you have lymph nodes removed from the armpit area.  After a sentinel node biopsy, the risk is approximately 5-9% and is higher after an axillary node dissection.  There is treatment available for this condition and it is not life-threatening.  Contact your physician or physical therapist with concerns. You may begin the 4 shoulder/posture exercises (see additional sheet) when permitted by your physician (typically a week after surgery).  If you have drains, you may need to wait until those are removed before beginning range of motion exercises.  A general recommendation is to not lift your arms above shoulder height until drains are removed.  These exercises should be done to your tolerance and gently.  This is not a "no pain/no gain" type of recovery so listen to your body and stretch into the range of motion that you can tolerate, stopping if you have pain.  If you are having immediate reconstruction, ask your plastic surgeon about doing exercises as he or she may want you to wait. We encourage you to attend the free one time ABC (After Breast Cancer) class offered by [facility].  You will learn information   related to lymphedema risk, prevention and treatment and additional exercises to regain mobility following surgery.  You can call [phone] for more information.  This is offered  the 1st and 3rd Monday of each month.  You only attend the class one time. While undergoing any medical procedure or treatment, try to avoid blood pressure being taken or needle sticks from occurring on the arm on the side of cancer.   This recommendation begins after surgery and continues for the rest of your life.  This may help reduce your risk of getting lymphedema (swelling in your arm). An excellent resource for those seeking information on lymphedema is the National Lymphedema Network's web site. It can be accessed at [place].org If you notice swelling in your hand, arm or breast at any time following surgery (even if it is many years from now), please contact your doctor or physical therapist to discuss this.  Lymphedema can be treated at any time but it is easier for you if it is treated early on.  If you feel like your shoulder motion is not returning to normal in a reasonable amount of time, please contact your surgeon or physical therapist.  [facility] [phone]. [address], [address], [place] [address]  ABC CLASS After Breast Cancer Class  After Breast Cancer Class is a specially designed exercise class to assist you in a safe recover after having breast cancer surgery.  In this class you will learn how to get back to full function whether your drains were just removed or if you had surgery a month ago.  This one-time class is held the 1st and 3rd Monday of every month from 11:00 a.m. until 12:00 noon virtually.  This class is FREE and space is limited. For more information or to register for the next available class, call [phone].  Class Goals  Understand specific stretches to improve the flexibility of you chest and shoulder. Learn ways to safely strengthen your upper body and improve your posture. Understand the warning signs of infection and why you may be at risk for an arm infection. Learn about Lymphedema and  prevention.  ** You do not attend this class until after surgery.  Drains must be removed to participate  Patient was instructed today in a home exercise program today for post op shoulder range of motion. These included active assist shoulder flexion in sitting, scapular retraction, wall walking with shoulder abduction, and hands behind head external rotation.  She was encouraged to do these twice a day, holding 3 seconds and repeating 5 times when permitted by her physician.  Annia Friendly, [place] 09/09/21 12:50 PM

## 2021-09-10 ENCOUNTER — Inpatient Hospital Stay: Payer: Medicare Other | Attending: Hematology and Oncology

## 2021-09-10 ENCOUNTER — Inpatient Hospital Stay: Payer: Medicare Other

## 2021-09-10 DIAGNOSIS — Z170 Estrogen receptor positive status [ER+]: Secondary | ICD-10-CM | POA: Insufficient documentation

## 2021-09-10 DIAGNOSIS — Z79899 Other long term (current) drug therapy: Secondary | ICD-10-CM | POA: Insufficient documentation

## 2021-09-10 DIAGNOSIS — Z8349 Family history of other endocrine, nutritional and metabolic diseases: Secondary | ICD-10-CM | POA: Insufficient documentation

## 2021-09-10 DIAGNOSIS — Z881 Allergy status to other antibiotic agents status: Secondary | ICD-10-CM | POA: Insufficient documentation

## 2021-09-10 DIAGNOSIS — Z8249 Family history of ischemic heart disease and other diseases of the circulatory system: Secondary | ICD-10-CM | POA: Insufficient documentation

## 2021-09-10 DIAGNOSIS — C50411 Malignant neoplasm of upper-outer quadrant of right female breast: Secondary | ICD-10-CM | POA: Insufficient documentation

## 2021-09-10 LAB — RESEARCH LABS

## 2021-09-10 NOTE — Research (Signed)
[employer] 2021-05 - Specimen Collection Study to Evaluate Biomarkers in Subjects with Cancer   09/10/21  Informed Consent:  Patient Rhonda Richardson was identified by Dr. Chryl Heck as a potential candidate for the above listed study.  This Clinical Research Coordinator met with [name], [phone] on 09/10/21 in a manner and location that ensures patient privacy to discuss participation in the above listed research study.  Patient is Unaccompanied.  Patient was previously provided with informed consent documents.  Patient has not yet read the informed consent documents and so documents were reviewed page by page today.  As outlined in the informed consent form, this Coordinator and LATRENDA IRANI discussed the purpose of the research study, the investigational nature of the study, study procedures and requirements for study participation, potential risks and benefits of study participation, as well as alternatives to participation.  This study is not blinded or double-blinded. The patient understands participation is voluntary and they may withdraw from study participation at any time.  This study does not involve randomization.  This study does not involve an investigational drug or device. This study does not involve a placebo. Patient understands enrollment is pending full eligibility review.   Confidentiality and how the patient's information will be used as part of study participation were discussed.  Patient was informed there is reimbursement provided for their time and effort spent on trial participation.  The patient is encouraged to discuss research study participation with their insurance provider to determine what costs they may incur as part of study participation, including research related injury.    All questions were answered to patient's satisfaction.  The informed consent with embedded HIPAA language was reviewed page by page.  The patient's mental and emotional status is  appropriate to provide informed consent, and the patient verbalizes an understanding of study participation.  Patient has agreed to participate in the above listed research study and has voluntarily signed the informed consent version revised 11 May 2021 with embedded HIPAA language, version revised 11 May 2021  on 09/10/21 at 2:15pm.  The patient was provided with a copy of the signed informed consent form with embedded HIPAA language for their reference.  No study specific procedures were obtained prior to the signing of the informed consent document.  Approximately 15 minutes were spent with the patient reviewing the informed consent documents.  Patient was not requested to complete a Release of Information form.  Eligibility:  This Coordinator has reviewed this patient's inclusion and exclusion criteria and confirmed Rhonda Richardson is eligible for study participation.  Patient will continue with enrollment.  Eligibility confirmed by treating investigator, who also agrees that patient should proceed with enrollment.  Blood Collection: Research blood obtained by fresh venipuncture per patient's preference. Patient tolerated well without any adverse events.  Gift Card: $50 gift card given to patient for her participation in this study.   Data Collection: Medical History:  High Blood Pressure  Yes Coronary Artery Disease No Lupus    No Rheumatoid Arthritis  No Diabetes   No       Lynch Syndrome  No  Is the patient currently taking a magnesium supplement?   Yes If yes, dose and frequency? 250 mg once daily  Does the patient have a personal history of cancer (greater than 5 years ago)?  No  Does the patient have a family history of cancer in 1st or 2nd degree relatives? No  Does the patient have history of alcohol consumption? Yes  If yes, current or former? Current Number of years? 61 Drinks per week? 3.5  Does the patient have history of cigarette, cigar, pipe, or chewing tobacco  use?  Yes  If yes, current for former? Former If yes, type? Cigarettes If former, year stopped? 1968 Number of years? 5 Packs day? [place] Coordinator I  09/10/21 2:51 PM

## 2021-09-10 NOTE — Research (Signed)
[employer] 2021-05 - Specimen Collection Study to Evaluate Biomarkers in Subjects with Cancer   This Nurse has reviewed this patient's inclusion and exclusion criteria as a second review and confirms Rhonda Richardson is eligible for study participation.  Patient may continue with enrollment.  Vickii Penna, RN, BSN, CPN Clinical Research Nurse I [phone]  09/10/2021 4:38 PM

## 2021-09-11 ENCOUNTER — Encounter: Payer: Self-pay | Admitting: Genetic Counselor

## 2021-09-11 ENCOUNTER — Encounter: Payer: Self-pay | Admitting: *Deleted

## 2021-09-11 ENCOUNTER — Other Ambulatory Visit: Payer: Self-pay

## 2021-09-11 ENCOUNTER — Other Ambulatory Visit: Payer: Self-pay | Admitting: General Surgery

## 2021-09-11 ENCOUNTER — Encounter (HOSPITAL_BASED_OUTPATIENT_CLINIC_OR_DEPARTMENT_OTHER): Payer: Self-pay | Admitting: General Surgery

## 2021-09-11 ENCOUNTER — Telehealth: Payer: Self-pay | Admitting: Emergency Medicine

## 2021-09-11 ENCOUNTER — Telehealth: Payer: Self-pay | Admitting: *Deleted

## 2021-09-11 DIAGNOSIS — Z170 Estrogen receptor positive status [ER+]: Secondary | ICD-10-CM

## 2021-09-11 NOTE — Telephone Encounter (Signed)
[employer] 2021-05 - Specimen Collection Study to Evaluate Biomarkers in Subjects with Cancer   09/11/21  Called to clarify patient's use of magnesium.  Patient states she takes '250mg'$  of magnesium oxide daily, and states she started taking it in approximately 2017.  She takes this to help with constipation.  The patient was thanked for her time and was advised to call with any questions.  Clabe Seal Clinical Research Coordinator I  09/11/21  1:58 PM

## 2021-09-11 NOTE — Progress Notes (Signed)
REFERRING PROVIDER: Benay Pike, MD [place],  [place] [address]  PRIMARY PROVIDER:  Kelton Pillar, MD  PRIMARY REASON FOR VISIT:  1. Malignant neoplasm of upper-outer quadrant of right breast in female, estrogen receptor positive ([place])     HISTORY OF PRESENT ILLNESS:   Rhonda Richardson, a 81 y.o. female, was seen for a [facility] cancer genetics consultation at the request of Dr. Chryl Heck due to a personal history of breast.  Rhonda Richardson presents to clinic today to discuss the possibility of a hereditary predisposition to cancer, to discuss genetic testing, and to further clarify her future cancer risks, as well as potential cancer risks for family members.   In May 2023, at the age of 79, Rhonda Richardson was diagnosed with invasive ductal carcinoma of the right breat (ER+/PR+/HER2+). The treatment plan is pending.   CANCER HISTORY:  Oncology History  Malignant neoplasm of upper-outer quadrant of right breast in female, estrogen receptor positive ([place])  08/12/2021 Mammogram   Mammogram showed possible mass in the right breast.  Diagnostic mammogram showed 2 highly suspicious upper outer right breast masses measuring 1.5 cm in the 9:30 position and 1.4 cm at the 10 o'clock position.  These masses or distortions mammographically spanning a distance of up to 3.5 cm.  No abnormal appearing right axillary lymph nodes.   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  Prognostic showed ER 99% positive strong staining PR 10% positive moderate staining, HER2 positive 3+ and Ki-67 of 30%.   09/08/2021 Initial Diagnosis   Malignant neoplasm of upper-outer quadrant of right breast in female, estrogen receptor positive ([place])      RISK FACTORS:  Colonoscopy: yes;  most recent in 2016 . Menarche was at age 51.  First live birth at age 67.  OCP use for approximately 0 years.    Past Medical History:  Diagnosis Date    Hypertension     Past Surgical History:  Procedure Laterality Date   cataract surgery Bilateral    DILATION AND CURETTAGE OF UTERUS  1970   eyelid tendon repair Bilateral     FAMILY HISTORY:  We obtained a detailed, 4-generation family history.  Significant diagnoses are listed below: Family History  Problem Relation Age of Onset   Thyroid cancer Other 59       niece's daughter       Rhonda Richardson is unaware of previous family history of genetic testing for hereditary cancer risks.  There is no reported Ashkenazi Jewish ancestry. There is no known consanguinity.  GENETIC COUNSELING ASSESSMENT: Rhonda Richardson is a 81 y.o. female with a personal history of breast cancer which is not highly suggestive of a hereditary cancer syndrome. We, therefore, discussed and recommended the following at today's visit.   DISCUSSION: We discussed that, in general, most cancer is not inherited in families, but instead is sporadic or familial. Sporadic cancers occur by chance and typically happen at older ages (>50 years) as this type of cancer is caused by genetic changes acquired during an individual's lifetime. Some families have more cancers than would be expected by chance; however, the ages or types of cancer are not consistent with a known genetic mutation or known genetic mutations have been ruled out. This type of familial cancer is thought to be due to a combination of multiple genetic, environmental, hormonal, and lifestyle factors. While this combination of factors likely increases the risk of cancer, the exact  source of this risk is not currently identifiable or testable.    We discussed that approximately 5-10% of cancer is hereditary, meaning that it is due to a mutation in a single gene that is passed down from generation to generation in a family. Most hereditary cases of hereditary breast cancer are associated with mutations in BRCA1/2. There are other genes that can be associated an increased  risk for breast cancer. We discussed that testing can be beneficial for several reasons, including knowing about other cancer risks, identifying potential screening and risk-reduction options that may be appropriate, and to understand if other family members could be at risk for cancer and allow them to undergo genetic testing.  We discussed with Rhonda Richardson that the personal and family history does not meet NCCN criteria for genetic testing and, therefore, is not highly consistent with a familial hereditary cancer syndrome.  We feel she is at low risk to harbor a gene mutation associated with such a condition. However, we discussed that genetic testing is available if she wishes to proceed, given that her family may be uninformative given she does not have sisters and several family members passed away before the age of 98.  We discussed that if she wished to proceed with genetic testing, we recommend panel testing including breast cancer genes as well as other common cancer genes.   The CustomNext-Cancer+RNAinsight panel offered by [facility] includes sequencing and rearrangement analysis for the following 47 genes:  APC, ATM, AXIN2, BARD1, BMPR1A, BRCA1, BRCA2, BRIP1, CDH1, CDK4, CDKN2A, CHEK2, DICER1, EPCAM, GREM1, HOXB13, MEN1, MLH1, MSH2, MSH3, MSH6, MUTYH, NBN, NF1, NF2, NTHL1, PALB2, PMS2, POLD1, POLE, PTEN, RAD51C, RAD51D, RECQL, RET, SDHA, SDHAF2, SDHB, SDHC, SDHD, SMAD4, SMARCA4, STK11, TP53, TSC1, TSC2, and VHL.  RNA data is routinely analyzed for use in variant interpretation for all genes.  PLAN: After considering the risks, benefits, and limitations, Rhonda Richardson provided informed consent to pursue genetic testing and the blood sample was sent to [facility] for analysis of the CustomNext-Cancer +RNAinsight Panel. Results should be available within approximately 3 weeks' time, at which point they will be disclosed by telephone to Rhonda Richardson, as will any additional recommendations  warranted by these results. Ms. Kayes will receive a summary of her genetic counseling visit and a copy of her results once available. This information will also be available in Epic.   Rhonda Richardson questions were answered to her satisfaction today. Our contact information was provided should additional questions or concerns arise. Thank you for the referral and allowing [place] to share in the care of your patient.   Rhonda Richardson, Rhonda Richardson, Rhonda Richardson Genetic Counselor [email or web address] (P) [phone]  The patient was seen for a total of 10 minutes in face-to-face genetic counseling.  Patient was accompanied by her son, Jenny Reichmann.  Drs. Lindi Adie and/or Burr Medico were available to discuss this case as needed.    _______________________________________________________________________ For Office Staff:  Number of people involved in session: 2 Was an Intern/ student involved with case: no

## 2021-09-11 NOTE — Telephone Encounter (Signed)
Spoke with patient to follow up from [facility] 5/31 and assess navigation needs.  Answered questions and encouraged her to call should she have any other questions or concerns. Patient verbalized understanding.

## 2021-09-14 ENCOUNTER — Encounter (HOSPITAL_BASED_OUTPATIENT_CLINIC_OR_DEPARTMENT_OTHER)
Admission: RE | Admit: 2021-09-14 | Discharge: 2021-09-14 | Disposition: A | Discharge status: Home / Self Care | Payer: Medicare Other | Source: Ambulatory Visit | Attending: General Surgery | Admitting: General Surgery

## 2021-09-14 ENCOUNTER — Ambulatory Visit
Admission: RE | Admit: 2021-09-14 | Discharge: 2021-09-14 | Disposition: A | Discharge status: Home / Self Care | Payer: Medicare Other | Source: Ambulatory Visit | Attending: General Surgery | Admitting: General Surgery

## 2021-09-14 ENCOUNTER — Ambulatory Visit (HOSPITAL_COMMUNITY)
Admission: RE | Admit: 2021-09-14 | Discharge: 2021-09-14 | Disposition: A | Discharge status: Home / Self Care | Payer: Medicare Other | Source: Ambulatory Visit | Attending: Hematology and Oncology | Admitting: Hematology and Oncology

## 2021-09-14 ENCOUNTER — Encounter: Payer: Self-pay | Admitting: *Deleted

## 2021-09-14 DIAGNOSIS — Z5111 Encounter for antineoplastic chemotherapy: Secondary | ICD-10-CM | POA: Diagnosis not present

## 2021-09-14 DIAGNOSIS — Z0189 Encounter for other specified special examinations: Secondary | ICD-10-CM | POA: Diagnosis not present

## 2021-09-14 DIAGNOSIS — Z170 Estrogen receptor positive status [ER+]: Secondary | ICD-10-CM | POA: Insufficient documentation

## 2021-09-14 DIAGNOSIS — C50411 Malignant neoplasm of upper-outer quadrant of right female breast: Secondary | ICD-10-CM | POA: Diagnosis not present

## 2021-09-14 DIAGNOSIS — C50911 Malignant neoplasm of unspecified site of right female breast: Secondary | ICD-10-CM | POA: Diagnosis not present

## 2021-09-14 DIAGNOSIS — I10 Essential (primary) hypertension: Secondary | ICD-10-CM | POA: Insufficient documentation

## 2021-09-14 DIAGNOSIS — Z01810 Encounter for preprocedural cardiovascular examination: Secondary | ICD-10-CM | POA: Insufficient documentation

## 2021-09-14 LAB — ECHOCARDIOGRAM COMPLETE
Area-P 1/2: 3.99 cm2
Calc EF: 59.2 %
S' Lateral: 2.3 cm
Single Plane A2C EF: 58.2 %
Single Plane A4C EF: 63.6 %

## 2021-09-14 MED ORDER — CHLORHEXIDINE GLUCONATE CLOTH 2 % EX PADS: 6.0000 | MEDICATED_PAD | Freq: Once | CUTANEOUS | Status: DC

## 2021-09-14 NOTE — Progress Notes (Signed)

## 2021-09-14 NOTE — Progress Notes (Signed)
  Echocardiogram 2D Echocardiogram has been performed.  Rhonda Richardson 09/14/2021, 8:46 AM

## 2021-09-14 NOTE — Progress Notes (Signed)
Text sent to patient reminding to come in for lab work today.

## 2021-09-14 NOTE — H&P (Signed)
REFERRING PHYSICIAN:  Dr. Jeanmarie Plant Patient Care Team: Maryla Morrow, MD as PCP - General (Family Medicine) Georgianne Fick, MD as Consulting Provider (Surgical Oncology) Marye Round, MD (Radiation Oncology) Iruku, Flo Shanks, MD (Hematology and Oncology) Lovenia Kim, MD (Obstetrics and Gynecology)     PROVIDER:  Georgianne Fick, MD   MRN: [id number] DOB: Mar 16, 1941 DATE OF ENCOUNTER: 09/09/2021   Subjective    Chief Complaint: Breast Cancer       History of Present Illness: Rhonda Richardson is a 81 y.o. female who is seen today as an office consultation at the request of Dr. Chryl Heck for evaluation of Breast Cancer     Pt is a lovely 81 yo F who presented with a screening detected right breast mass.  She then had diagnostic mammogram and ultrasound.  She was found to have 2 masses.  She had a 1.4 cm mass at 10:00 and a 1.5 cm mass at 930.  Both of these were biopsied.  One of the clips migrated.  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  Breast density was B.   She has no family history of cancer.  Her only medical history is hypertension.   Imaging is reviewed with radiology as well as pathology in a multidisciplinary fashion today.   Family cancer history none Menarche 39 Menopause 24 Parity G3P4, first child age 35   Work part time at [employer]       Diagnostic mammogram/us: BCG 08/25/21 ACR Breast Density Category b: There are scattered areas of fibroglandular density.   FINDINGS: 2D/3D spot compression views of the RIGHT breast demonstrate a 3.5 cm area spiculated masses/distortions within the UPPER-OUTER RIGHT breast. Heterogeneous calcifications are noted within the posterior aspect of this area.   On physical exam, mild thickening is identified at the 10 o'clock position of the RIGHT breast 4 cm from the nipple.   Targeted  ultrasound of the RIGHT breast is performed, showing the following:   A 0.6 x 0.7 x 1.5 cm irregular hypoechoic mass the 9:30 position 4 cm from the nipple.   A 1.4 x 1.4 x 1.2 cm ill-defined hypoechoic mass at the 10:30 position of the RIGHT breast 4 cm from the nipple.   The 2 above masses are separated by a distance of 0.5 cm.   No abnormal RIGHT axillary lymph nodes are noted.   IMPRESSION: 1. Two highly suspicious UPPER-OUTER RIGHT breast masses, measuring 1.5 cm at the 9:30 position and 1.4 cm at the 10 o'clock position. The masses/distortions mammographically span a distance of up to 3.5 cm. 2. No abnormal appearing RIGHT axillary lymph nodes.   RECOMMENDATION: Ultrasound-guided biopsies of both UPPER OUTER RIGHT breast masses, which will be scheduled.   I have discussed the findings and recommendations with the patient. If applicable, a reminder letter will be sent to the patient regarding the next appointment.   BI-RADS CATEGORY  5: Highly suggestive of malignancy.     Pathology core needle biopsy: 08/31/2021 1. Breast, right, needle core biopsy, 9:30 o'clock, 4cmfn (ribbon clip) - INVASIVE DUCTAL CARCINOMA - SEE COMMENT 2. Breast, right, needle core biopsy, 10 o'clock, 4cmfn (coil clip) - INVASIVE DUCTAL CARCINOMA - SEE COMMENT 1. and 2. Based on the biopsy, the carcinoma appears Nottingham grade 2 of 3 and measures 1.5 cm in greatest linear extent   Receptors: The tumor cells are  POSITIVE for Her2 (3+). Estrogen Receptor: 99%, POSITIVE, STRONG STAINING INTENSITY Progesterone Receptor: 10%, POSITIVE, MODERATE STAINING INTENSITY Proliferation Marker Ki67: 30%   Review of Systems: A complete review of systems was obtained from the patient.  I have reviewed this information and discussed as appropriate with the patient.  See HPI as well for other ROS.     Medical History: Past Medical History      Past Medical History:  Diagnosis Date   History of cancer      Hyperlipidemia             Patient Active Problem List  Diagnosis   Malignant neoplasm of upper-outer quadrant of right breast in female, estrogen receptor positive (CMS-HCC)   Essential hypertension      Past Surgical History       Past Surgical History:  Procedure Laterality Date   CATARACT EXTRACTION Bilateral      Date Unknown   Cyst removed N/A      Date Unknown   DILATION AND CURETTAGE, DIAGNOSTIC / THERAPEUTIC N/A      Date Unknown   Eyelid Surgery x 2 N/A      Date Unknown        Allergies      Allergies  Allergen Reactions   Macrodantin [Nitrofurantoin Macrocrystal] Hives              Current Outpatient Medications on File Prior to Visit  Medication Sig Dispense Refill   benazepriL (LOTENSIN) 20 MG tablet Take 20 mg by mouth once daily       hydroCHLOROthiazide (MICROZIDE) 12.5 mg capsule Take 12.5 mg by mouth once daily        No current facility-administered medications on file prior to visit.      Family History       Family History  Problem Relation Age of Onset   Stroke Mother     High blood pressure (Hypertension) Mother     Coronary Artery Disease (Blocked arteries around heart) Mother     Coronary Artery Disease (Blocked arteries around heart) Father     Coronary Artery Disease (Blocked arteries around heart) Brother          Social History        Tobacco Use  Smoking Status Former   Types: Cigarettes   Quit date: 1968   Years since quitting: 55.4  Smokeless Tobacco Never      Social History  Social History         Socioeconomic History   Marital status: Married  Tobacco Use   Smoking status: Former      Types: Cigarettes      Quit date: 1968      Years since quitting: 55.4   Smokeless tobacco: Never  Vaping Use   Vaping Use: Never used  Substance and Sexual Activity   Alcohol use: Yes      Comment: "Occasionally"   Drug use: Never        Objective:         Vitals:    09/09/21 1208  BP: (!) 163/89   Pulse: 100  Temp: 36.4 C (97.5 F)  Weight: 64.5 kg (142 lb 4.8 oz)    There is no height or weight on file to calculate BMI.   Gen:  No acute distress.  Well nourished and well groomed.   Neurological: Alert and oriented to person, place, and time. Coordination normal.  Head: Normocephalic and atraumatic.  Eyes: Conjunctivae are normal. Pupils  are equal, round, and reactive to light. No scleral icterus.  Neck: Normal range of motion. Neck supple. No tracheal deviation or thyromegaly present.  Cardiovascular: Normal rate, regular rhythm, normal heart sounds and intact distal pulses.  Exam reveals no gallop and no friction rub.  No murmur heard. Breast: right breast smaller than left.  Right breast with a spot of skin tethering UOQ.  ? Palpable mass vs hematoma around 1-2 cm in this location.  No LAD.  No nipple retraction or nipple discharge.  Left breast benign. Respiratory: Effort normal.  No respiratory distress. No chest wall tenderness. Breath sounds normal.  No wheezes, rales or rhonchi.  GI: Soft. Bowel sounds are normal. The abdomen is soft and nontender.  There is no rebound and no guarding.  Musculoskeletal: Normal range of motion. Extremities are nontender.  Lymphadenopathy: No cervical, preauricular, postauricular or axillary adenopathy is present Skin: Skin is warm and dry. No rash noted. No diaphoresis. No erythema. No pallor. No clubbing, cyanosis, or edema.   Psychiatric: Normal mood and affect. Behavior is normal. Judgment and thought content normal.      Labs CBC and CMET normal 09/09/21   Assessment and Plan:    Malignant neoplasm of upper-outer quadrant of right breast in female, estrogen receptor positive (CMS-HCC)  (primary encounter diagnosis)   Essential hypertension   Patient is a new diagnosis of clinical T1CN0 right breast cancer.   She appears to be a good candidate for a seed bracketed lumpectomy and sentinel lymph node biopsy.  Again, one of the clips  migrated so both clips might not be in the lumpectomy specimen.  She is in excellent health so chemotherapy is also recommended and I will place a Port-A-Cath.  Surgery will be followed by chemotherapy.  She will then receive adjuvant radiation and antihormonal treatment.   The surgical procedure was described to the patient.  I discussed the incision type and location and that we would need radiology involved on with a wire or seed marker and/or sentinel node.       The risks and benefits of the procedure were described to the patient and she wishes to proceed.     We discussed the risks bleeding, infection, damage to other structures, need for further procedures/surgeries.  We discussed the risk of seroma.  The patient was advised if the area in the breast in cancer, we may need to go back to surgery for additional tissue to obtain negative margins or for a lymph node biopsy. The patient was advised that these are the most common complications, but that others can occur as well.  They were advised against taking aspirin or other anti-inflammatory agents/blood thinners the week before surgery.     I also discussed the risk of Port-A-Cath including pneumothorax, malposition, malfunction, and blood clot.   She is eager to do this at the first available opportunity.     No follow-ups on file.     Milus Height, MD FACS Surgical Oncology, General Surgery, Trauma and [facility]

## 2021-09-15 ENCOUNTER — Other Ambulatory Visit: Payer: Self-pay

## 2021-09-15 ENCOUNTER — Ambulatory Visit
Admission: RE | Admit: 2021-09-15 | Discharge: 2021-09-15 | Disposition: A | Discharge status: Home / Self Care | Payer: Medicare Other | Source: Ambulatory Visit | Attending: General Surgery | Admitting: General Surgery

## 2021-09-15 ENCOUNTER — Encounter (HOSPITAL_BASED_OUTPATIENT_CLINIC_OR_DEPARTMENT_OTHER)
Admission: RE | Disposition: A | Discharge status: Home / Self Care | Payer: Self-pay | Source: Home / Self Care | Attending: General Surgery

## 2021-09-15 ENCOUNTER — Ambulatory Visit (HOSPITAL_BASED_OUTPATIENT_CLINIC_OR_DEPARTMENT_OTHER): Payer: Medicare Other | Admitting: Anesthesiology

## 2021-09-15 ENCOUNTER — Ambulatory Visit (HOSPITAL_BASED_OUTPATIENT_CLINIC_OR_DEPARTMENT_OTHER)
Admission: RE | Admit: 2021-09-15 | Discharge: 2021-09-15 | Disposition: A | Discharge status: Home / Self Care | Payer: Medicare Other | Attending: General Surgery | Admitting: General Surgery

## 2021-09-15 ENCOUNTER — Telehealth: Payer: Self-pay | Admitting: Hematology and Oncology

## 2021-09-15 ENCOUNTER — Encounter (HOSPITAL_BASED_OUTPATIENT_CLINIC_OR_DEPARTMENT_OTHER): Payer: Self-pay | Admitting: General Surgery

## 2021-09-15 ENCOUNTER — Ambulatory Visit (HOSPITAL_COMMUNITY): Payer: Medicare Other

## 2021-09-15 DIAGNOSIS — Z170 Estrogen receptor positive status [ER+]: Secondary | ICD-10-CM | POA: Insufficient documentation

## 2021-09-15 DIAGNOSIS — G8918 Other acute postprocedural pain: Secondary | ICD-10-CM | POA: Diagnosis not present

## 2021-09-15 DIAGNOSIS — C50411 Malignant neoplasm of upper-outer quadrant of right female breast: Secondary | ICD-10-CM

## 2021-09-15 DIAGNOSIS — Z79899 Other long term (current) drug therapy: Secondary | ICD-10-CM | POA: Diagnosis not present

## 2021-09-15 DIAGNOSIS — I10 Essential (primary) hypertension: Secondary | ICD-10-CM | POA: Insufficient documentation

## 2021-09-15 DIAGNOSIS — R928 Other abnormal and inconclusive findings on diagnostic imaging of breast: Secondary | ICD-10-CM | POA: Diagnosis not present

## 2021-09-15 DIAGNOSIS — Z452 Encounter for adjustment and management of vascular access device: Secondary | ICD-10-CM | POA: Diagnosis not present

## 2021-09-15 DIAGNOSIS — C50911 Malignant neoplasm of unspecified site of right female breast: Secondary | ICD-10-CM

## 2021-09-15 HISTORY — PX: BREAST LUMPECTOMY WITH RADIOACTIVE SEED AND SENTINEL LYMPH NODE BIOPSY: SHX6550

## 2021-09-15 HISTORY — PX: PORTACATH PLACEMENT: SHX2246

## 2021-09-15 HISTORY — DX: Malignant neoplasm of unspecified site of unspecified female breast: C50.919

## 2021-09-15 SURGERY — INSERTION, TUNNELED CENTRAL VENOUS DEVICE, WITH PORT
Anesthesia: General | Site: Breast | Laterality: Right

## 2021-09-15 MED ORDER — BUPIVACAINE-EPINEPHRINE 0.25% -1:200000 IJ SOLN
INTRAMUSCULAR | Status: DC | PRN
  Administered 2021-09-15: 15 mL

## 2021-09-15 MED ORDER — HEPARIN (PORCINE) IN NACL 1000-0.9 UT/500ML-% IV SOLN
INTRAVENOUS | Status: AC
  Filled 2021-09-15: qty 500

## 2021-09-15 MED ORDER — BUPIVACAINE HCL (PF) 0.25 % IJ SOLN
INTRAMUSCULAR | Status: AC
  Filled 2021-09-15: qty 30

## 2021-09-15 MED ORDER — EPHEDRINE SULFATE (PRESSORS) 50 MG/ML IJ SOLN
INTRAMUSCULAR | Status: DC | PRN
  Administered 2021-09-15: 15 mg via INTRAVENOUS

## 2021-09-15 MED ORDER — MIDAZOLAM HCL 2 MG/2ML IJ SOLN
INTRAMUSCULAR | Status: AC
  Filled 2021-09-15: qty 2

## 2021-09-15 MED ORDER — HYDROCODONE-ACETAMINOPHEN 5-325 MG PO TABS: 1.0000 | ORAL_TABLET | Freq: Four times a day (QID) | ORAL | 0 refills | Status: DC | PRN

## 2021-09-15 MED ORDER — GABAPENTIN 100 MG PO CAPS
ORAL_CAPSULE | ORAL | Status: AC
  Filled 2021-09-15: qty 1

## 2021-09-15 MED ORDER — LACTATED RINGERS IV SOLN: INTRAVENOUS | Status: DC

## 2021-09-15 MED ORDER — OXYCODONE HCL 5 MG PO TABS: 5.0000 mg | ORAL_TABLET | Freq: Once | ORAL | Status: DC | PRN

## 2021-09-15 MED ORDER — PROPOFOL 10 MG/ML IV BOLUS
INTRAVENOUS | Status: DC | PRN
  Administered 2021-09-15: 50 mg via INTRAVENOUS

## 2021-09-15 MED ORDER — FENTANYL CITRATE (PF) 100 MCG/2ML IJ SOLN
INTRAMUSCULAR | Status: AC
  Filled 2021-09-15: qty 2

## 2021-09-15 MED ORDER — CLONIDINE HCL (ANALGESIA) 100 MCG/ML EP SOLN
EPIDURAL | Status: DC | PRN
  Administered 2021-09-15: 80 ug

## 2021-09-15 MED ORDER — BUPIVACAINE-EPINEPHRINE (PF) 0.25% -1:200000 IJ SOLN
INTRAMUSCULAR | Status: AC
  Filled 2021-09-15: qty 30

## 2021-09-15 MED ORDER — ACETAMINOPHEN 500 MG PO TABS
ORAL_TABLET | ORAL | Status: AC
  Filled 2021-09-15: qty 1

## 2021-09-15 MED ORDER — GABAPENTIN 100 MG PO CAPS
100.0000 mg | ORAL_CAPSULE | ORAL | Status: AC
  Administered 2021-09-15: 100 mg via ORAL

## 2021-09-15 MED ORDER — LIDOCAINE-EPINEPHRINE (PF) 1 %-1:200000 IJ SOLN
INTRAMUSCULAR | Status: AC
  Filled 2021-09-15: qty 30

## 2021-09-15 MED ORDER — FENTANYL CITRATE (PF) 100 MCG/2ML IJ SOLN
100.0000 ug | Freq: Once | INTRAMUSCULAR | Status: AC
  Administered 2021-09-15: 100 ug via INTRAVENOUS

## 2021-09-15 MED ORDER — CEFAZOLIN SODIUM-DEXTROSE 2-4 GM/100ML-% IV SOLN
INTRAVENOUS | Status: AC
  Filled 2021-09-15: qty 100

## 2021-09-15 MED ORDER — HYDROMORPHONE HCL 1 MG/ML IJ SOLN: 0.2500 mg | INTRAMUSCULAR | Status: DC | PRN

## 2021-09-15 MED ORDER — OXYCODONE HCL 5 MG/5ML PO SOLN: 5.0000 mg | Freq: Once | ORAL | Status: DC | PRN

## 2021-09-15 MED ORDER — ACETAMINOPHEN 500 MG PO TABS
1000.0000 mg | ORAL_TABLET | ORAL | Status: AC
  Administered 2021-09-15: 1000 mg via ORAL

## 2021-09-15 MED ORDER — DEXAMETHASONE SODIUM PHOSPHATE 4 MG/ML IJ SOLN
INTRAMUSCULAR | Status: DC | PRN
  Administered 2021-09-15: 5 mg via INTRAVENOUS

## 2021-09-15 MED ORDER — DEXAMETHASONE SODIUM PHOSPHATE 4 MG/ML IJ SOLN
INTRAMUSCULAR | Status: DC | PRN
  Administered 2021-09-15: 5 mg via PERINEURAL

## 2021-09-15 MED ORDER — HEPARIN SOD (PORK) LOCK FLUSH 100 UNIT/ML IV SOLN
INTRAVENOUS | Status: DC | PRN
  Administered 2021-09-15: 500 [IU]

## 2021-09-15 MED ORDER — CEFAZOLIN SODIUM-DEXTROSE 2-4 GM/100ML-% IV SOLN
2.0000 g | INTRAVENOUS | Status: AC
  Administered 2021-09-15: 2 g via INTRAVENOUS

## 2021-09-15 MED ORDER — HEPARIN SOD (PORK) LOCK FLUSH 100 UNIT/ML IV SOLN
INTRAVENOUS | Status: AC
  Filled 2021-09-15: qty 5

## 2021-09-15 MED ORDER — 0.9 % SODIUM CHLORIDE (POUR BTL) OPTIME
TOPICAL | Status: DC | PRN
  Administered 2021-09-15: 200 mL

## 2021-09-15 MED ORDER — GABAPENTIN 300 MG PO CAPS
ORAL_CAPSULE | ORAL | Status: AC
  Filled 2021-09-15: qty 1

## 2021-09-15 MED ORDER — FENTANYL CITRATE (PF) 100 MCG/2ML IJ SOLN
INTRAMUSCULAR | Status: DC | PRN
  Administered 2021-09-15: 25 ug via INTRAVENOUS
  Administered 2021-09-15: 50 ug via INTRAVENOUS

## 2021-09-15 MED ORDER — MAGTRACE LYMPHATIC TRACER
INTRAMUSCULAR | Status: DC | PRN
  Administered 2021-09-15: 2 mL via INTRAMUSCULAR

## 2021-09-15 MED ORDER — HEPARIN (PORCINE) IN NACL 2-0.9 UNITS/ML
INTRAMUSCULAR | Status: AC | PRN
  Administered 2021-09-15: 1 via INTRAVENOUS

## 2021-09-15 MED ORDER — ROPIVACAINE HCL 5 MG/ML IJ SOLN
INTRAMUSCULAR | Status: DC | PRN
  Administered 2021-09-15: 30 mL via PERINEURAL

## 2021-09-15 MED ORDER — LIDOCAINE 2% (20 MG/ML) 5 ML SYRINGE
INTRAMUSCULAR | Status: DC | PRN
  Administered 2021-09-15: 30 mg via INTRAVENOUS

## 2021-09-15 MED ORDER — AMISULPRIDE (ANTIEMETIC) 5 MG/2ML IV SOLN
10.0000 mg | Freq: Once | INTRAVENOUS | Status: AC | PRN
  Administered 2021-09-15: 10 mg via INTRAVENOUS

## 2021-09-15 MED ORDER — AMISULPRIDE (ANTIEMETIC) 5 MG/2ML IV SOLN
INTRAVENOUS | Status: AC
Start: 2021-09-15 — End: ?
  Filled 2021-09-15: qty 4

## 2021-09-15 SURGICAL SUPPLY — 80 items
ADH SKN CLS APL DERMABOND .7 (GAUZE/BANDAGES/DRESSINGS) ×4
APL PRP STRL LF DISP 70% ISPRP (MISCELLANEOUS) ×4
BAG DECANTER FOR FLEXI CONT (MISCELLANEOUS) ×3 IMPLANT
BINDER BREAST LRG (GAUZE/BANDAGES/DRESSINGS) ×1 IMPLANT
BINDER BREAST MEDIUM (GAUZE/BANDAGES/DRESSINGS) IMPLANT
BINDER BREAST XLRG (GAUZE/BANDAGES/DRESSINGS) IMPLANT
BINDER BREAST XXLRG (GAUZE/BANDAGES/DRESSINGS) IMPLANT
BLADE HEX COATED 2.75 (ELECTRODE) ×3 IMPLANT
BLADE SURG 10 STRL SS (BLADE) ×3 IMPLANT
BLADE SURG 11 STRL SS (BLADE) ×3 IMPLANT
BLADE SURG 15 STRL LF DISP TIS (BLADE) ×2 IMPLANT
BLADE SURG 15 STRL SS (BLADE) ×3
BNDG COHESIVE 4X5 TAN ST LF (GAUZE/BANDAGES/DRESSINGS) ×3 IMPLANT
CANISTER SUC SOCK COL 7IN (MISCELLANEOUS) IMPLANT
CANISTER SUCT 1200ML W/VALVE (MISCELLANEOUS) ×3 IMPLANT
CHLORAPREP W/TINT 26 (MISCELLANEOUS) ×4 IMPLANT
CLIP TI LARGE 6 (CLIP) ×3 IMPLANT
CLIP TI MEDIUM 6 (CLIP) ×6 IMPLANT
CLIP TI WIDE RED SMALL 6 (CLIP) IMPLANT
COVER BACK TABLE 60X90IN (DRAPES) ×2 IMPLANT
COVER MAYO STAND STRL (DRAPES) ×5 IMPLANT
COVER PROBE W GEL 5X96 (DRAPES) ×4 IMPLANT
DERMABOND ADVANCED (GAUZE/BANDAGES/DRESSINGS) ×2
DERMABOND ADVANCED .7 DNX12 (GAUZE/BANDAGES/DRESSINGS) ×2 IMPLANT
DRAPE C-ARM 42X72 X-RAY (DRAPES) ×3 IMPLANT
DRAPE LAPAROTOMY TRNSV 102X78 (DRAPES) ×2 IMPLANT
DRAPE UTILITY XL STRL (DRAPES) ×4 IMPLANT
DRSG PAD ABDOMINAL 8X10 ST (GAUZE/BANDAGES/DRESSINGS) ×3 IMPLANT
DRSG TEGADERM 4X4.75 (GAUZE/BANDAGES/DRESSINGS) IMPLANT
ELECT COATED BLADE 2.86 ST (ELECTRODE) ×3 IMPLANT
ELECT REM PT RETURN 9FT ADLT (ELECTROSURGICAL) ×3
ELECTRODE REM PT RTRN 9FT ADLT (ELECTROSURGICAL) ×2 IMPLANT
GAUZE 4X4 16PLY ~~LOC~~+RFID DBL (SPONGE) ×2 IMPLANT
GAUZE SPONGE 4X4 12PLY STRL LF (GAUZE/BANDAGES/DRESSINGS) ×2 IMPLANT
GLOVE BIO SURGEON STRL SZ 6 (GLOVE) ×3 IMPLANT
GLOVE BIO SURGEON STRL SZ7 (GLOVE) ×2 IMPLANT
GLOVE BIOGEL PI IND STRL 6.5 (GLOVE) ×2 IMPLANT
GLOVE BIOGEL PI IND STRL 7.0 (GLOVE) IMPLANT
GLOVE BIOGEL PI INDICATOR 6.5 (GLOVE) ×2
GLOVE BIOGEL PI INDICATOR 7.0 (GLOVE) ×3
GOWN STRL REUS W/ TWL LRG LVL3 (GOWN DISPOSABLE) ×2 IMPLANT
GOWN STRL REUS W/TWL 2XL LVL3 (GOWN DISPOSABLE) ×4 IMPLANT
GOWN STRL REUS W/TWL LRG LVL3 (GOWN DISPOSABLE) ×6
IV CONNECTOR ONE LINK NDLESS (IV SETS) IMPLANT
KIT MARKER MARGIN INK (KITS) ×3 IMPLANT
KIT PORT POWER 8FR ISP CVUE (Port) ×1 IMPLANT
LIGHT WAVEGUIDE WIDE FLAT (MISCELLANEOUS) ×1 IMPLANT
NDL HYPO 25X1 1.5 SAFETY (NEEDLE) ×2 IMPLANT
NDL SAFETY ECLIPSE 18X1.5 (NEEDLE) ×2 IMPLANT
NEEDLE HYPO 18GX1.5 SHARP (NEEDLE)
NEEDLE HYPO 25X1 1.5 SAFETY (NEEDLE) ×6 IMPLANT
NS IRRIG 1000ML POUR BTL (IV SOLUTION) ×3 IMPLANT
PACK BASIN DAY SURGERY FS (CUSTOM PROCEDURE TRAY) ×3 IMPLANT
PACK UNIVERSAL I (CUSTOM PROCEDURE TRAY) ×3 IMPLANT
PENCIL SMOKE EVACUATOR (MISCELLANEOUS) ×3 IMPLANT
SLEEVE SCD COMPRESS KNEE MED (STOCKING) ×3 IMPLANT
SPIKE FLUID TRANSFER (MISCELLANEOUS) IMPLANT
SPONGE T-LAP 18X18 ~~LOC~~+RFID (SPONGE) ×5 IMPLANT
STAPLER VISISTAT 35W (STAPLE) IMPLANT
STOCKINETTE IMPERVIOUS LG (DRAPES) ×3 IMPLANT
STRIP CLOSURE SKIN 1/2X4 (GAUZE/BANDAGES/DRESSINGS) ×3 IMPLANT
SUT ETHILON 2 0 FS 18 (SUTURE) IMPLANT
SUT MNCRL AB 4-0 PS2 18 (SUTURE) ×5 IMPLANT
SUT MON AB 5-0 PS2 18 (SUTURE) IMPLANT
SUT PROLENE 2 0 SH DA (SUTURE) ×6 IMPLANT
SUT SILK 2 0 SH (SUTURE) IMPLANT
SUT VIC AB 2-0 SH 27 (SUTURE) ×3
SUT VIC AB 2-0 SH 27XBRD (SUTURE) ×2 IMPLANT
SUT VIC AB 3-0 SH 27 (SUTURE)
SUT VIC AB 3-0 SH 27X BRD (SUTURE) ×2 IMPLANT
SUT VICRYL 3-0 CR8 SH (SUTURE) ×4 IMPLANT
SYR 10ML LL (SYRINGE) ×3 IMPLANT
SYR 5ML LUER SLIP (SYRINGE) ×3 IMPLANT
SYR BULB EAR ULCER 3OZ GRN STR (SYRINGE) ×2 IMPLANT
SYR CONTROL 10ML LL (SYRINGE) ×3 IMPLANT
TOWEL GREEN STERILE FF (TOWEL DISPOSABLE) ×3 IMPLANT
TRACER MAGTRACE VIAL (MISCELLANEOUS) ×1 IMPLANT
TRAY FAXITRON CT DISP (TRAY / TRAY PROCEDURE) ×3 IMPLANT
TUBE CONNECTING 20X1/4 (TUBING) ×3 IMPLANT
YANKAUER SUCT BULB TIP NO VENT (SUCTIONS) ×3 IMPLANT

## 2021-09-15 NOTE — Telephone Encounter (Signed)
Scheduled appointment per inbasket message. Left message.   ?

## 2021-09-15 NOTE — Progress Notes (Signed)
Assisted Dr. Germeroth with right, pectoralis, ultrasound guided block. Side rails up, monitors on throughout procedure. See vital signs in flow sheet. Tolerated Procedure well. 

## 2021-09-15 NOTE — Interval H&P Note (Signed)
History and Physical Interval Note:  09/15/2021 11:53 AM  Rhonda Richardson  has presented today for surgery, with the diagnosis of RIGHT BREAST CANCER.  The various methods of treatment have been discussed with the patient and family. After consideration of risks, benefits and other options for treatment, the patient has consented to  Procedure(s) with comments: RIGHT BREAST SEED BRACKETED LUMPECTOMY AND SENTINEL NODE BIOPSY (Right) - GEN ANESTHESIA COMBINED WITH REGIONAL FOR POST-OP PAIN PORT PLACEMENT (N/A) as a surgical intervention.  The patient's history has been reviewed, patient examined, no change in status, stable for surgery.  I have reviewed the patient's chart and labs.  Questions were answered to the patient's satisfaction.     [name]

## 2021-09-15 NOTE — Discharge Instructions (Addendum)
[place] Office Phone Number [phone]  BREAST BIOPSY/ PARTIAL MASTECTOMY: POST OP INSTRUCTIONS  Always review your discharge instruction sheet given to you by the facility where your surgery was performed.  IF YOU HAVE DISABILITY OR FAMILY LEAVE FORMS, YOU MUST BRING THEM TO THE OFFICE FOR PROCESSING.  DO NOT GIVE THEM TO YOUR DOCTOR.  Take 2 tylenol (acetominophen) three times a day for 3 days.  If you still have pain, add ibuprofen with food in between if able to take this (if you have kidney issues or stomach issues, do not take ibuprofen).  If both of those are not enough, add the narcotic pain pill.  If you find you are needing a lot of this overnight after surgery, call the next morning for a refill.    Prescriptions will not be filled after 5pm or on week-ends. Take your usually prescribed medications unless otherwise directed You should eat very light the first 24 hours after surgery, such as soup, crackers, pudding, etc.  Resume your normal diet the day after surgery. Most patients will experience some swelling and bruising in the breast.  Ice packs and a good support bra will help.  Swelling and bruising can take several days to resolve.  It is common to experience some constipation if taking pain medication after surgery.  Increasing fluid intake and taking a stool softener will usually help or prevent this problem from occurring.  A mild laxative (Milk of Magnesia or Miralax) should be taken according to package directions if there are no bowel movements after 48 hours. Unless discharge instructions indicate otherwise, you may remove your bandages 48 hours after surgery, and you may shower at that time.  You may have steri-strips (small skin tapes) in place directly over the incision.  These strips should be left on the skin at least for for 7-10 days.    ACTIVITIES:  You may resume regular daily activities (gradually increasing) beginning the next day.  Wearing a  good support bra or sports bra (or the breast binder) minimizes pain and swelling.  You may have sexual intercourse when it is comfortable. No heavy lifting for 1-2 weeks (not over around 10 pounds).  You may drive when you no longer are taking prescription pain medication, you can comfortably wear a seatbelt, and you can safely maneuver your car and apply brakes. RETURN TO WORK:  __________3-14 days depending on job. _______________ Dennis Bast should see your doctor in the office for a follow-up appointment approximately two weeks after your surgery.  Your doctor's nurse will typically make your follow-up appointment when she calls you with your pathology report.  Expect your pathology report 3-4 business days after your surgery.  You may call to check if you do not hear from [place] after three days.   WHEN TO CALL YOUR DOCTOR: Fever over 101.0 Nausea and/or vomiting. Extreme swelling or bruising. Continued bleeding from incision. Increased pain, redness, or drainage from the incision.  The clinic staff is available to answer your questions during regular business hours.  Please don't hesitate to call and ask to speak to one of the nurses for clinical concerns.  If you have a medical emergency, go to the nearest emergency room or call 911.  A surgeon from [facility] is always on call at the hospital.  For further questions, please visit [email or web address]   No Tylenol before 4pm 09/15/21   Post Anesthesia Home Care Instructions  Activity: Get plenty of rest for the remainder of the  day. A responsible individual must stay with you for 24 hours following the procedure.  For the next 24 hours, DO NOT: -Drive a car -Paediatric nurse -Drink alcoholic beverages -Take any medication unless instructed by your physician -Make any legal decisions or sign important papers.  Meals: Start with liquid foods such as gelatin or soup. Progress to regular foods as tolerated. Avoid greasy,  spicy, heavy foods. If nausea and/or vomiting occur, drink only clear liquids until the nausea and/or vomiting subsides. Call your physician if vomiting continues.  Special Instructions/Symptoms: Your throat may feel dry or sore from the anesthesia or the breathing tube placed in your throat during surgery. If this causes discomfort, gargle with warm salt water. The discomfort should disappear within 24 hours.  If you had a scopolamine patch placed behind your ear for the management of post- operative nausea and/or vomiting:  1. The medication in the patch is effective for 72 hours, after which it should be removed.  Wrap patch in a tissue and discard in the trash. Wash hands thoroughly with soap and water. 2. You may remove the patch earlier than 72 hours if you experience unpleasant side effects which may include dry mouth, dizziness or visual disturbances. 3. Avoid touching the patch. Wash your hands with soap and water after contact with the patch.

## 2021-09-15 NOTE — Anesthesia Procedure Notes (Signed)
Anesthesia Regional Block: Pectoralis block   Pre-Anesthetic Checklist: , timeout performed,  Correct Patient, Correct Site, Correct Laterality,  Correct Procedure, Correct Position, site marked,  Risks and benefits discussed,  Surgical consent,  Pre-op evaluation,  At surgeon's request and post-op pain management  Laterality: Right  Prep: chloraprep       Needles:   Needle Type: Stimiplex     Needle Length: 9cm      Additional Needles:   Procedures:,,,, ultrasound used (permanent image in chart),,    Narrative:  Start time: 09/15/2021 10:25 AM End time: 09/15/2021 10:45 AM Injection made incrementally with aspirations every 5 mL.  Performed by: Personally  Anesthesiologist: Nolon Nations, MD  Additional Notes: BP cuff, SpO2 and EKG monitors applied. Sedation begun.  Anesthetic injected incrementally, slowly, and after neg aspirations under direct ultrasound guidance. Good fascial spread noted. Patient tolerated well.

## 2021-09-15 NOTE — Op Note (Signed)
Right Breast Radioactive seed localized lumpectomy and sentinel lymph node biopsy, left subclavian port placement  Indications: This patient presents with history of right breast cancer, cmT1cN0 +/+/+, upper outer quadrant.  Grade 2 invasive ductal carcinoma  Pre-operative Diagnosis: right breast cancer  Post-operative Diagnosis: Same  Surgeon: [name]   Anesthesia: General endotracheal anesthesia  ASA Class: 2  Procedure Details  The patient was seen in the Holding Room. The risks, benefits, complications, treatment options, and expected outcomes were discussed with the patient. The possibilities of bleeding, infection, the need for additional procedures, failure to diagnose a condition, and creating a complication requiring transfusion or operation were discussed with the patient. The patient concurred with the proposed plan, giving informed consent.  The site of surgery properly noted/marked. The patient was taken to Operating Room # 1, identified, and the procedure verified as right Breast Seed localized Lumpectomy with sentinel lymph node biopsy, port placement.    The left arm was tucked and a shoulder roll was placed.  The upper chest/breast and neck were prepped and draped in sterile fashion.  Local anesthetic was administered over the left infraclavicular skin/tissues.  The vein was accessed with 1 pass(es) of the needle. There was good venous return and the wire passed easily with brief ectopy.  Fluoroscopy was used to confirm that the wire was in the vena cava.      The patient was placed back level and the area for the pocket was anethetized with local anesthetic.  A 3-cm transverse incision was made with a #15 blade.  Cautery was used to divide the subcutaneous tissues down to the pectoralis muscle.  An [employer] retractor was used to elevate the skin while a pocket was created on top of the pectoralis fascia.  The port was placed into the pocket to confirm that it was of  adequate size.  The   catheter was preattached to the port.  The port was then secured to the pectoralis fascia with four 2-0 Prolene sutures.  These were clamped and not tied down yet.    The catheter was tunneled through to the wire exit site.  The catheter was placed along the wire to determine what length it should be to be in the SVC.  The catheter was cut at 23 cm.  The tunneler sheath and dilator were passed over the wire and the dilator and wire were removed.  The catheter was advanced through the tunneler sheath and the tunneler sheath was pulled away.  Care was taken to keep the catheter in the tunneler sheath as this occurred. This was advanced and the tunneler sheath was removed.  There was good venous   return and easy flush of the catheter.  The Prolene sutures were tied down to the pectoral fascia.  The skin was reapproximated using 3-0 Vicryl interrupted deep dermal sutures.    Fluoroscopy was used to re-confirm good position of the catheter.  The skin was then closed using 4-0 Monocryl in a subcuticular fashion.  The port was flushed with concentrated heparin flush as well.   Repeat time out was performed after surgical staff removed their lead.   The lumpectomy was performed by creating a transverse lateral incision near the previously placed radioactive seed.  Dissection was carried down to around the point of maximum signal intensity. The cautery was used to perform the dissection.  Hemostasis was achieved with cautery. The edges of the cavity were marked with large clips, with one each medial, lateral, inferior and  superior, and two clips posteriorly.   The specimen was inked with the margin marker paint kit.    Specimen radiography confirmed inclusion of the mammographic lesion, the clips, and the seeds.  The background signal in the breast was zero.  The wound was irrigated and closed with 3-0 vicryl in layers and 4-0 monocryl subcuticular suture.    Using the Sentimag probe, right  axillary sentinel nodes were identified transcutaneously.  An oblique incision was created below the axillary hairline.  Dissection was carried through the clavipectoral fascia.  Four deep level 2 axillary sentinel nodes were removed.  Counts per second were highest at 490.    The background count was 20 cps.  The wound was irrigated.  Hemostasis was achieved with cautery.  The axillary incision was closed with a 3-0 vicryl deep dermal interrupted sutures and a 4-0 monocryl subcuticular closure.    Sterile dressings were applied. At the end of the operation, all sponge, instrument, and needle counts were correct.  Findings: grossly clear surgical margins and no adenopathy, anterior margin is skin.   SLN #1 palpable, SLN #2 cps 51, SLN #3 cps 490, SLN #4 cps 80  Estimated Blood Loss:  min         Specimens: right breast tissue with seeds and four right axillary sentinel lymph nodes.             Complications:  None; patient tolerated the procedure well.         Disposition: PACU - hemodynamically stable.         Condition: stable

## 2021-09-15 NOTE — Transfer of Care (Signed)
Immediate Anesthesia Transfer of Care Note  Patient: Rhonda Richardson  Procedure(s) Performed: PORT PLACEMENT RIGHT BREAST SEED BRACKETED LUMPECTOMY AND SENTINEL NODE BIOPSY (Right: Breast)  Patient Location: PACU  Anesthesia Type:General  Level of Consciousness: sedated  Airway & Oxygen Therapy: Patient Spontanous Breathing and Patient connected to face mask oxygen  Post-op Assessment: Report given to RN and Post -op Vital signs reviewed and stable  Post vital signs: Reviewed and stable  Last Vitals:  Vitals Value Taken Time  BP    Temp    Pulse 85 09/15/21 1413  Resp 16 09/15/21 1413  SpO2 93 % 09/15/21 1413  Vitals shown include unvalidated device data.  Last Pain:  Vitals:   09/15/21 0929  TempSrc: Oral  PainSc: 0-No pain      Patients Stated Pain Goal: 7 (98/92/11 9417)  Complications: No notable events documented.

## 2021-09-15 NOTE — Anesthesia Preprocedure Evaluation (Addendum)
Anesthesia Evaluation  Patient identified by MRN, date of birth, ID band Patient awake    Reviewed: Allergy & Precautions, NPO status , Patient's Chart, lab work & pertinent test results  Airway Mallampati: II  TM Distance: >3 FB Neck ROM: Full    Dental no notable dental hx. (+) Dental Advisory Given, Teeth Intact   Pulmonary neg pulmonary ROS,    Pulmonary exam normal breath sounds clear to auscultation       Cardiovascular hypertension, Pt. on medications Normal cardiovascular exam Rhythm:Regular Rate:Normal  Echo 09/2021 1. Left ventricular ejection fraction, by estimation, is 55 to 60%. The left ventricle has normal function. The left ventricle has no regional wall motion abnormalities. Left ventricular diastolic parameters were normal. The average left ventricular global longitudinal strain is -23.1 %. The global longitudinal strain is normal.  2. Right ventricular systolic function is normal. The right ventricular size is normal. There is normal pulmonary artery systolic pressure.  3. The mitral valve is normal in structure. Trivial mitral valve regurgitation. No evidence of mitral stenosis.  4. The aortic valve is tricuspid. There is mild calcification of the aortic valve. Aortic valve regurgitation is not visualized. No aortic stenosis is present.  5. Aortic dilatation noted. There is borderline dilatation of the ascending aorta, measuring 38 mm.  6. The inferior vena cava is normal in size with greater than 50% respiratory variability, suggesting right atrial pressure of 3 mmHg   Neuro/Psych negative neurological ROS     GI/Hepatic negative GI ROS, Neg liver ROS,   Endo/Other  negative endocrine ROS  Renal/GU negative Renal ROS     Musculoskeletal negative musculoskeletal ROS (+)   Abdominal   Peds  Hematology negative hematology ROS (+)   Anesthesia Other Findings   Reproductive/Obstetrics                             Anesthesia Physical Anesthesia Plan  ASA: 2  Anesthesia Plan: General   Post-op Pain Management: Regional block*, Tylenol PO (pre-op)* and Gabapentin PO (pre-op)*   Induction: Intravenous  PONV Risk Score and Plan: 4 or greater and Ondansetron, Dexamethasone and Treatment may vary due to age or medical condition  Airway Management Planned: LMA  Additional Equipment:   Intra-op Plan:   Post-operative Plan: Extubation in OR  Informed Consent: I have reviewed the patients History and Physical, chart, labs and discussed the procedure including the risks, benefits and alternatives for the proposed anesthesia with the patient or authorized representative who has indicated his/her understanding and acceptance.     Dental advisory given  Plan Discussed with: CRNA  Anesthesia Plan Comments:       Anesthesia Quick Evaluation

## 2021-09-15 NOTE — Anesthesia Procedure Notes (Signed)
Procedure Name: LMA Insertion Date/Time: 09/15/2021 12:04 PM Performed by: Signe Colt, CRNA Pre-anesthesia Checklist: Patient identified, Emergency Drugs available, Suction available and Patient being monitored Patient Re-evaluated:Patient Re-evaluated prior to induction Oxygen Delivery Method: Circle System Utilized Preoxygenation: Pre-oxygenation with 100% oxygen Induction Type: IV induction Ventilation: Mask ventilation without difficulty LMA: LMA inserted LMA Size: 4.0 Number of attempts: 1 Airway Equipment and Method: bite block Placement Confirmation: positive ETCO2 Tube secured with: Tape Dental Injury: Teeth and Oropharynx as per pre-operative assessment

## 2021-09-16 ENCOUNTER — Encounter (HOSPITAL_BASED_OUTPATIENT_CLINIC_OR_DEPARTMENT_OTHER): Payer: Self-pay | Admitting: General Surgery

## 2021-09-16 NOTE — Anesthesia Postprocedure Evaluation (Signed)
Anesthesia Post Note  Patient: Rhonda Richardson  Procedure(s) Performed: PORT PLACEMENT RIGHT BREAST SEED BRACKETED LUMPECTOMY AND SENTINEL NODE BIOPSY (Right: Breast)     Patient location during evaluation: PACU Anesthesia Type: General Level of consciousness: sedated and patient cooperative Pain management: pain level controlled Vital Signs Assessment: post-procedure vital signs reviewed and stable Respiratory status: spontaneous breathing Cardiovascular status: stable Anesthetic complications: no   No notable events documented.  Last Vitals:  Vitals:   09/15/21 1500 09/15/21 1545  BP: 136/70 (!) 151/73  Pulse: 97 99  Resp: 16 18  Temp:  36.6 C  SpO2: 95% 94%    Last Pain:  Vitals:   09/16/21 1009  TempSrc:   PainSc: [place]

## 2021-09-18 LAB — SURGICAL PATHOLOGY

## 2021-09-21 ENCOUNTER — Encounter: Payer: Self-pay | Admitting: *Deleted

## 2021-09-21 ENCOUNTER — Other Ambulatory Visit: Payer: Self-pay | Admitting: General Surgery

## 2021-09-23 ENCOUNTER — Telehealth: Payer: Self-pay | Admitting: *Deleted

## 2021-09-23 NOTE — Telephone Encounter (Signed)
Spoke to pt concerning need for re-excision and future appts. Discussed f/u appt with Dr. Chryl Heck and chemo class.

## 2021-09-28 ENCOUNTER — Ambulatory Visit: Payer: Self-pay | Admitting: Genetic Counselor

## 2021-09-28 ENCOUNTER — Encounter: Payer: Self-pay | Admitting: Genetic Counselor

## 2021-09-28 ENCOUNTER — Telehealth: Payer: Self-pay | Admitting: Genetic Counselor

## 2021-09-28 DIAGNOSIS — Z1379 Encounter for other screening for genetic and chromosomal anomalies: Secondary | ICD-10-CM

## 2021-09-28 DIAGNOSIS — Z170 Estrogen receptor positive status [ER+]: Secondary | ICD-10-CM

## 2021-09-28 NOTE — Progress Notes (Signed)
HPI:   Ms. Mcmurry was previously seen in the [place] clinic due to a personal history of breast cancer and concerns regarding a hereditary predisposition to cancer. Please refer to our prior cancer genetics clinic note for more information regarding our discussion, assessment and recommendations, at the time. Ms. Lavey recent genetic test results were disclosed to her, as were recommendations warranted by these results. These results and recommendations are discussed in more detail below.  CANCER HISTORY:  Oncology History  Malignant neoplasm of upper-outer quadrant of right breast in female, estrogen receptor positive ([place])  08/12/2021 Mammogram   Mammogram showed possible mass in the right breast.  Diagnostic mammogram showed 2 highly suspicious upper outer right breast masses measuring 1.5 cm in the 9:30 position and 1.4 cm at the 10 o'clock position.  These masses or distortions mammographically spanning a distance of up to 3.5 cm.  No abnormal appearing right axillary lymph nodes.   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  Prognostic showed ER 99% positive strong staining PR 10% positive moderate staining, HER2 positive 3+ and Ki-67 of 30%.   09/08/2021 Initial Diagnosis   Malignant neoplasm of upper-outer quadrant of right breast in female, estrogen receptor positive ([place])   09/23/2021 Genetic Testing   Negative hereditary cancer genetic testing: no pathogenic variants detected in [facility] CustomNext-cancer +RNAinsight Panel.  Report date is September 23, 2021.   The CustomNext-Cancer+RNAinsight panel offered by [facility] includes sequencing and rearrangement analysis for the following 47 genes:  APC, ATM, AXIN2, BARD1, BMPR1A, BRCA1, BRCA2, BRIP1, CDH1, CDK4, CDKN2A, CHEK2, DICER1, EPCAM, GREM1, HOXB13, MEN1, MLH1, MSH2, MSH3, MSH6, MUTYH, NBN, NF1, NF2, NTHL1, PALB2, PMS2, POLD1, POLE, PTEN, RAD51C,  RAD51D, RECQL, RET, SDHA, SDHAF2, SDHB, SDHC, SDHD, SMAD4, SMARCA4, STK11, TP53, TSC1, TSC2, and VHL.  RNA data is routinely analyzed for use in variant interpretation for all genes.     FAMILY HISTORY:  We obtained a detailed, 4-generation family history.  Significant diagnoses are listed below: Family History  Problem Relation Age of Onset   Thyroid cancer Other 61       niece's daughter        Ms. Fischbach is unaware of previous family history of genetic testing for hereditary cancer risks.  There is no reported Ashkenazi Jewish ancestry. There is no known consanguinity.    GENETIC TEST RESULTS:  The [facility] CustomNext-Cancer +RNAinsight Panel found no pathogenic mutations. The CustomNext-Cancer+RNAinsight panel offered by [facility] includes sequencing and rearrangement analysis for the following 47 genes:  APC, ATM, AXIN2, BARD1, BMPR1A, BRCA1, BRCA2, BRIP1, CDH1, CDK4, CDKN2A, CHEK2, DICER1, EPCAM, GREM1, HOXB13, MEN1, MLH1, MSH2, MSH3, MSH6, MUTYH, NBN, NF1, NF2, NTHL1, PALB2, PMS2, POLD1, POLE, PTEN, RAD51C, RAD51D, RECQL, RET, SDHA, SDHAF2, SDHB, SDHC, SDHD, SMAD4, SMARCA4, STK11, TP53, TSC1, TSC2, and VHL.  RNA data is routinely analyzed for use in variant interpretation for all genes.  The test report has been scanned into EPIC and is located under the Molecular Pathology section of the Results Review tab.  A portion of the result report is included below for reference. Genetic testing reported out on September 23, 2021.      Even though a pathogenic variant was not identified, possible explanations for the cancer in the family may include: There may be no hereditary risk for cancer in the family. The cancers in Ms. Crandle and/or her family may be sporadic/familial or due to other genetic and environmental  factors. There may be a gene mutation in one of these genes that current testing methods cannot detect but that chance is small. There could be another gene that has not yet  been discovered, or that we have not yet tested, that is responsible for the cancer diagnoses in the family.   Therefore, it is important to remain in touch with cancer genetics in the future so that we can continue to offer Ms. Miedema the most up to date genetic testing.   ADDITIONAL GENETIC TESTING:  We discussed with Ms. Charters that her genetic testing was fairly extensive.  If there are additional relevant genes identified to increase cancer risk that can be analyzed in the future, we would be happy to discuss and coordinate this testing at that time.     CANCER SCREENING RECOMMENDATIONS:  Ms. Khatib test result is considered negative (normal).  This means that we have not identified a hereditary cause for her personal history of breast cancer at this time.   An individual's cancer risk and medical management are not determined by genetic test results alone. Overall cancer risk assessment incorporates additional factors, including personal medical history, family history, and any available genetic information that may result in a personalized plan for cancer prevention and surveillance. Therefore, it is recommended she continue to follow the cancer management and screening guidelines provided by her oncology and primary healthcare provider.  RECOMMENDATIONS FOR FAMILY MEMBERS:   Since she did not inherit a identifiable mutation in a cancer predisposition gene included on this panel, her children could not have inherited a known mutation from her in one of these genes. Individuals in this family might be at some increased risk of developing cancer, over the general population risk, due to the family history of cancer.  Individuals in the family should notify their providers of the family history of cancer. We recommend women in this family have a yearly mammogram beginning at age 57, or 25 years younger than the earliest onset of cancer, an annual clinical breast exam, and perform monthly  breast self-exams.    FOLLOW-UP:  Lastly, we discussed with Ms. Brahmbhatt that cancer genetics is a rapidly advancing field and it is possible that new genetic tests will be appropriate for her and/or her family members in the future. We encouraged her to remain in contact with cancer genetics on an annual basis so we can update her personal and family histories and let her know of advances in cancer genetics that may benefit this family.   Our contact number was provided. Ms. Rupert questions were answered to her satisfaction, and she knows she is welcome to call us at anytime with additional questions or concerns.   Georgianne Gritz M. Joette Catching, [place], [facility] [place] Genetic Counselor [email or web address] (P) [phone]

## 2021-09-28 NOTE — Telephone Encounter (Signed)
Revealed negative genetic testing.  Discussed that we do not know why she has  cancer. It could be sporadic/famillial, due to a different gene that we are not testing, or maybe our current technology may not be able to pick something up.  It will be important for her to keep in contact with genetics to keep up with whether additional testing may be needed.

## 2021-10-01 ENCOUNTER — Inpatient Hospital Stay: Payer: Medicare Other

## 2021-10-01 ENCOUNTER — Inpatient Hospital Stay (HOSPITAL_BASED_OUTPATIENT_CLINIC_OR_DEPARTMENT_OTHER): Payer: Medicare Other | Admitting: Hematology and Oncology

## 2021-10-01 ENCOUNTER — Other Ambulatory Visit: Payer: Self-pay

## 2021-10-01 ENCOUNTER — Telehealth: Payer: Self-pay | Admitting: *Deleted

## 2021-10-01 ENCOUNTER — Encounter: Payer: Self-pay | Admitting: *Deleted

## 2021-10-01 ENCOUNTER — Encounter: Payer: Self-pay | Admitting: Hematology and Oncology

## 2021-10-01 VITALS — BP 166/76 | HR 85 | Temp 97.9°F | Resp 16 | Ht 63.0 in | Wt 144.4 lb

## 2021-10-01 DIAGNOSIS — C50411 Malignant neoplasm of upper-outer quadrant of right female breast: Secondary | ICD-10-CM | POA: Diagnosis not present

## 2021-10-01 DIAGNOSIS — Z8249 Family history of ischemic heart disease and other diseases of the circulatory system: Secondary | ICD-10-CM | POA: Diagnosis not present

## 2021-10-01 DIAGNOSIS — Z170 Estrogen receptor positive status [ER+]: Secondary | ICD-10-CM

## 2021-10-01 DIAGNOSIS — Z881 Allergy status to other antibiotic agents status: Secondary | ICD-10-CM | POA: Diagnosis not present

## 2021-10-01 DIAGNOSIS — Z79899 Other long term (current) drug therapy: Secondary | ICD-10-CM | POA: Diagnosis not present

## 2021-10-01 DIAGNOSIS — Z8349 Family history of other endocrine, nutritional and metabolic diseases: Secondary | ICD-10-CM | POA: Diagnosis not present

## 2021-10-01 MED ORDER — DEXAMETHASONE 4 MG PO TABS: 8.0000 mg | ORAL_TABLET | Freq: Two times a day (BID) | ORAL | 1 refills | Status: DC

## 2021-10-01 MED ORDER — PROCHLORPERAZINE MALEATE 10 MG PO TABS: 10.0000 mg | ORAL_TABLET | Freq: Four times a day (QID) | ORAL | 1 refills | Status: DC | PRN

## 2021-10-01 MED ORDER — ONDANSETRON HCL 8 MG PO TABS: 8.0000 mg | ORAL_TABLET | Freq: Two times a day (BID) | ORAL | 1 refills | Status: DC | PRN

## 2021-10-01 MED ORDER — LIDOCAINE-PRILOCAINE 2.5-2.5 % EX CREA: TOPICAL_CREAM | CUTANEOUS | 3 refills | Status: DC

## 2021-10-01 NOTE — Assessment & Plan Note (Addendum)
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  She currently has 2 masses located at 9:30 position and 10 o'clock position each measuring 1.5 and 1.4 cm respectively.  She had right breast lumpectomy which showed 3.6 cm tumor, triple positive, positive margins, negative SLN involvement. She is going for margin excision on July 6 th. Given large tumor, triple positive, we discussed dose modified TCH for 6 cycles followed by adjuvant herceptin vs taxol with herceptin. She wants to try dose modified TCH Most recent ECHO satisfactory She had port  Placed by Dr Barry Dienes during her first surgery. RTC 2 weeks after re-excision.

## 2021-10-01 NOTE — Telephone Encounter (Signed)
Received message from patient regarding some questions she has.  Spoke with patient and answered questions and discussed her treatment plan. She states she does have an area in her right axilla from where she had her sx that is swollen about the size of a golf ball. She denies any redness or pain. Informed her that this could just be effects from surgery or sometimes there is a collection of fluid that may need to be aspirated.  Informed her I would let Dr. Barry Dienes' office know to reach out to her tomorrow. Patient verbalized understanding.

## 2021-10-01 NOTE — Progress Notes (Signed)
START ON PATHWAY REGIMEN - Breast     Cycle 1: A cycle is 21 days:     Trastuzumab-xxxx      Docetaxel      Carboplatin    Cycles 2 through 6: A cycle is every 21 days:     Trastuzumab-xxxx      Docetaxel      Carboplatin    Cycles 7 through 17: A cycle is every 21 days:     Trastuzumab-xxxx   **Always confirm dose/schedule in your pharmacy ordering system**  Patient Characteristics: Postoperative without Neoadjuvant Therapy (Pathologic Staging), Invasive Disease, Adjuvant Therapy, HER2 Positive, ER Positive, Node Negative, pT2, pN0, Tumor Size >  3 cm Therapeutic Status: Postoperative without Neoadjuvant Therapy (Pathologic Staging) AJCC Grade: G3 AJCC N Category: pN0 AJCC M Category: cM0 ER Status: Positive (+) AJCC 8 Stage Grouping: IA HER2 Status: Positive (+) Oncotype Dx Recurrence Score: Not Appropriate AJCC T Category: pT2 PR Status: Positive (+) Adjuvant Therapy Status: No Adjuvant Therapy Received Yet or Changing Initial Adjuvant Regimen due to Tolerance Intent of Therapy: Curative Intent, Discussed with Patient

## 2021-10-02 ENCOUNTER — Inpatient Hospital Stay: Payer: Medicare Other

## 2021-10-05 ENCOUNTER — Ambulatory Visit: Payer: Medicare Other | Admitting: Physical Therapy

## 2021-10-07 NOTE — Pre-Procedure Instructions (Signed)
Surgical Instructions    Your procedure is scheduled on Thursday 10/15/21.   Report to [facility] Main Entrance "A" at 10:15 A.M., then check in with the Admitting office.  Call this number if you have problems the morning of surgery:  [phone]   If you have any questions prior to your surgery date call [phone]: Open Monday-Friday 8am-4pm    Remember:  Do not eat after midnight the night before your surgery  You may drink clear liquids until 09:15 A.M. the morning of your surgery.   Clear liquids allowed are: Water, Non-Citrus Juices (without pulp), Carbonated Beverages, Clear Tea, Black Coffee ONLY (NO MILK, CREAM OR POWDERED CREAMER of any kind), and Gatorade  Patient Instructions  The night before surgery:  No food after midnight. ONLY clear liquids after midnight  The day of surgery (if you do NOT have diabetes):  Drink ONE (1) Pre-Surgery Clear Ensure by 09:15 A.M. the morning of surgery. Drink in one sitting. Do not sip.  This drink was given to you during your hospital  pre-op appointment visit.  Nothing else to drink after completing the  Pre-Surgery Clear Ensure.         If you have questions, please contact your surgeon's office.     Take these medicines the morning of surgery with A SIP OF WATER:   NONE   Take these medicines if needed:   acetaminophen (TYLENOL)  famotidine (PEPCID)  ondansetron (ZOFRAN)    As of today, STOP taking any Aspirin (unless otherwise instructed by your surgeon) Aleve, Naproxen, Ibuprofen, Motrin, Advil, Goody's, BC's, all herbal medications, fish oil, and all vitamins.           Do not wear jewelry or makeup Do not wear lotions, powders, perfumes/colognes, or deodorant. Do not shave 48 hours prior to surgery.  Men may shave face and neck. Do not bring valuables to the hospital. Do not wear nail polish, gel polish, artificial nails, or any other type of covering on natural nails (fingers and toes) If you have artificial  nails or gel coating that need to be removed by a nail salon, please have this removed prior to surgery. Artificial nails or gel coating may interfere with anesthesia's ability to adequately monitor your vital signs.  [facility] is not responsible for any belongings or valuables. .   Do NOT Smoke (Tobacco/Vaping)  24 hours prior to your procedure  If you use a CPAP at night, you may bring your mask for your overnight stay.   Contacts, glasses, hearing aids, dentures or partials may not be worn into surgery, please bring cases for these belongings   For patients admitted to the hospital, discharge time will be determined by your treatment team.   Patients discharged the day of surgery will not be allowed to drive home, and someone needs to stay with them for 24 hours.   SURGICAL WAITING ROOM VISITATION Patients having surgery or a procedure in a hospital may have two support people. Children under the age of 6 must have an adult with them who is not the patient. They may stay in the waiting area during the procedure and may switch out with other visitors. If the patient needs to stay at the hospital during part of their recovery, the visitor guidelines for inpatient rooms apply.  Please refer to the [facility] website for the visitor guidelines for Inpatients (after your surgery is over and you are in a regular room).       Special  instructions:    Oral Hygiene is also important to reduce your risk of infection.  Remember - BRUSH YOUR TEETH THE MORNING OF SURGERY WITH YOUR REGULAR TOOTHPASTE   [place]- Preparing For Surgery  Before surgery, you can play an important role. Because skin is not sterile, your skin needs to be as free of germs as possible. You can reduce the number of germs on your skin by washing with CHG (chlorahexidine gluconate) Soap before surgery.  CHG is an antiseptic cleaner which kills germs and bonds with the skin to continue killing germs even after  washing.     Please do not use if you have an allergy to CHG or antibacterial soaps. If your skin becomes reddened/irritated stop using the CHG.  Do not shave (including legs and underarms) for at least 48 hours prior to first CHG shower. It is OK to shave your face.  Please follow these instructions carefully.     Shower the NIGHT BEFORE SURGERY and the MORNING OF SURGERY with CHG Soap.   If you chose to wash your hair, wash your hair first as usual with your normal shampoo. After you shampoo, rinse your hair and body thoroughly to remove the shampoo.  Then [employer] and genitals (private parts) with your normal soap and rinse thoroughly to remove soap.  After that Use CHG Soap as you would any other liquid soap. You can apply CHG directly to the skin and wash gently with a scrungie or a clean washcloth.   Apply the CHG Soap to your body ONLY FROM THE NECK DOWN.  Do not use on open wounds or open sores. Avoid contact with your eyes, ears, mouth and genitals (private parts). Wash Face and genitals (private parts)  with your normal soap.   Wash thoroughly, paying special attention to the area where your surgery will be performed.  Thoroughly rinse your body with warm water from the neck down.  DO NOT shower/wash with your normal soap after using and rinsing off the CHG Soap.  Pat yourself dry with a CLEAN TOWEL.  Wear CLEAN PAJAMAS to bed the night before surgery  Place CLEAN SHEETS on your bed the night before your surgery  DO NOT SLEEP WITH PETS.   Day of Surgery:  Take a shower with CHG soap. Wear Clean/Comfortable clothing the morning of surgery Do not apply any deodorants/lotions.   Remember to brush your teeth WITH YOUR REGULAR TOOTHPASTE.    If you received a COVID test during your pre-op visit, it is requested that you wear a mask when out in public, stay away from anyone that may not be feeling well, and notify your surgeon if you develop symptoms. If you have been in  contact with anyone that has tested positive in the last 10 days, please notify your surgeon.    Please read over the following fact sheets that you were given.

## 2021-10-08 ENCOUNTER — Encounter (HOSPITAL_COMMUNITY): Payer: Self-pay

## 2021-10-08 ENCOUNTER — Other Ambulatory Visit: Payer: Self-pay

## 2021-10-08 ENCOUNTER — Encounter (HOSPITAL_COMMUNITY)
Admission: RE | Admit: 2021-10-08 | Discharge: 2021-10-08 | Disposition: A | Discharge status: Home / Self Care | Payer: Medicare Other | Source: Ambulatory Visit | Attending: General Surgery | Admitting: General Surgery

## 2021-10-08 VITALS — BP 159/84 | HR 84 | Temp 98.1°F | Resp 17 | Ht 62.0 in | Wt 142.3 lb

## 2021-10-08 DIAGNOSIS — Z01812 Encounter for preprocedural laboratory examination: Secondary | ICD-10-CM | POA: Insufficient documentation

## 2021-10-08 DIAGNOSIS — Z01818 Encounter for other preprocedural examination: Secondary | ICD-10-CM

## 2021-10-08 LAB — BASIC METABOLIC PANEL
Anion gap: 6 (ref 5–15)
BUN: 16 mg/dL (ref 8–23)
CO2: 28 mmol/L (ref 22–32)
Calcium: 9.4 mg/dL (ref 8.9–10.3)
Chloride: 102 mmol/L (ref 98–111)
Creatinine, Ser: 0.8 mg/dL (ref 0.44–1.00)
GFR, Estimated: >60 mL/min (ref >60)
Glucose, Bld: 103 mg/dL — ABNORMAL HIGH (ref 70–99)
Potassium: 4.1 mmol/L (ref 3.5–5.1)
Sodium: 136 mmol/L (ref 135–145)

## 2021-10-08 LAB — CBC
HCT: 37.3 % (ref 36.0–46.0)
Hemoglobin: 12.1 g/dL (ref 12.0–15.0)
MCH: 26.9 pg (ref 26.0–34.0)
MCHC: 32.4 g/dL (ref 30.0–36.0)
MCV: 83.1 fL (ref 80.0–100.0)
Platelets: 298 10*3/uL (ref 150–400)
RBC: 4.49 MIL/uL (ref 3.87–5.11)
RDW: 14.3 % (ref 11.5–15.5)
WBC: 6.3 10*3/uL (ref 4.0–10.5)
nRBC: 0 % (ref 0.0–0.2)

## 2021-10-08 NOTE — Progress Notes (Signed)
PCP - Dr. Kelton Pillar Cardiologist - denies  PPM/ICD - n/a   Chest x-ray - n/a EKG - 09/14/21 Stress Test - Many years ago. Normal per patient ECHO - denies Cardiac Cath - denies  Sleep Study - denies CPAP - n/a  Fasting Blood Sugar - n/a Checks Blood Sugar _____ times a day- n/a  Blood Thinner Instructions: n/a Aspirin Instructions: n/a  ERAS Protcol - Clear liquids until 0915 am day of surgery PRE-SURGERY Ensure or G2- Ensure  COVID TEST- n/a   Anesthesia review: No  Patient denies shortness of breath, fever, cough and chest pain at PAT appointment   All instructions explained to the patient, with a verbal understanding of the material. Patient agrees to go over the instructions while at home for a better understanding. The opportunity to ask questions was provided.

## 2021-10-09 ENCOUNTER — Encounter (HOSPITAL_COMMUNITY): Payer: Self-pay

## 2021-10-12 ENCOUNTER — Inpatient Hospital Stay: Payer: Medicare Other | Admitting: Physician Assistant

## 2021-10-12 ENCOUNTER — Inpatient Hospital Stay: Payer: Medicare Other

## 2021-10-14 ENCOUNTER — Inpatient Hospital Stay: Payer: Medicare Other

## 2021-10-14 NOTE — H&P (Signed)
  PROVIDER: Georgianne Fick, MD  MRN: [id number] DOB: 04-26-1940 DATE OF ENCOUNTER: 10/02/2021 Initial History:   Pt is a lovely 81 yo F who presented with a screening detected right breast cancer 08/2021. She then had diagnostic mammogram and ultrasound. She was found to have 2 masses. She had a 1.4 cm mass at 10:00 and a 1.5 cm mass at 930. Both of these were biopsied. One of the clips migrated. 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 Breast density was B.  She has no family history of cancer. Her only medical history is hypertension. Interval History:    Patient underwent right lumpectomy with sentinel node biopsy and port placement September 15, 2021. She called with right axillary swelling. She denies any pain from the surgery. She said she took 1 Tylenol preventatively but never had any need to take anymore. She had positive margins but negative lymph nodes. Because of the final size of the tumor, she is going to get a more extended course of chemotherapy.  Pathology 09/15/2021 A. BREAST, RIGHT, LUMPECTOMY:  - Invasive ductal carcinoma, 3.6 cm in maximal extent, involving  inferior and medial margins.  - Ductal carcinoma in situ, approaching to less than 0.1 cm of closest  (posterior) margin.  - Biopsy site changes.  - See comment.   B. LYMPH NODE, RIGHT AXILLARY #1, SENTINEL, EXCISION:  - No metastatic carcinoma identified in one lymph node (0/1).   C. LYMPH NODE, RIGHT AXILLARY #2, SENTINEL, EXCISION:  - No metastatic carcinoma identified in one lymph node (0/1).   D. LYMPH NODE, RIGHT AXILLARY #3, SENTINEL, EXCISION:  - No metastatic carcinoma identified in one lymph node (0/1).   E. LYMPH NODE, RIGHT AXILLARY #4, SENTINEL, EXCISION:  - No metastatic carcinoma identified in one lymph node (0/1).  Physical Examination:   Bruising right inner breast Axillary seroma  Assessment and Plan:    MICHEALE SCHLACK is a 81 y.o. female who underwent right breast conservation/port placement on 09/15/2021 for T2N0 right triple positive breast cancer.  Diagnoses and all orders for this visit:  Malignant neoplasm of upper-outer quadrant of right breast in female, estrogen receptor positive (CMS-HCC)  Patient has a planned reexcision on July 6. I will also aspirate the right axillary seroma at that time. Otherwise she is doing well.  No follow-ups on file.  The plan was discussed in detail with the patient today, who expressed understanding. The patient has my contact information, and understands to call me with any additional questions or concerns in the interval. I would be happy to see the patient back sooner if the need arises.   Georgianne Fick, MD

## 2021-10-15 ENCOUNTER — Other Ambulatory Visit: Payer: Self-pay

## 2021-10-15 ENCOUNTER — Ambulatory Visit (HOSPITAL_COMMUNITY)
Admission: RE | Admit: 2021-10-15 | Discharge: 2021-10-15 | Disposition: A | Discharge status: Home / Self Care | Payer: Medicare Other | Attending: General Surgery | Admitting: General Surgery

## 2021-10-15 ENCOUNTER — Encounter (HOSPITAL_COMMUNITY)
Admission: RE | Disposition: A | Discharge status: Home / Self Care | Payer: Self-pay | Source: Home / Self Care | Attending: General Surgery

## 2021-10-15 ENCOUNTER — Encounter (HOSPITAL_COMMUNITY): Payer: Self-pay | Admitting: General Surgery

## 2021-10-15 ENCOUNTER — Ambulatory Visit (HOSPITAL_BASED_OUTPATIENT_CLINIC_OR_DEPARTMENT_OTHER): Payer: Medicare Other | Admitting: Certified Registered"

## 2021-10-15 ENCOUNTER — Ambulatory Visit (HOSPITAL_COMMUNITY): Payer: Medicare Other | Admitting: Certified Registered"

## 2021-10-15 DIAGNOSIS — Z170 Estrogen receptor positive status [ER+]: Secondary | ICD-10-CM | POA: Diagnosis not present

## 2021-10-15 DIAGNOSIS — C50411 Malignant neoplasm of upper-outer quadrant of right female breast: Secondary | ICD-10-CM

## 2021-10-15 DIAGNOSIS — M96843 Postprocedural seroma of a musculoskeletal structure following other procedure: Secondary | ICD-10-CM | POA: Diagnosis not present

## 2021-10-15 DIAGNOSIS — D0511 Intraductal carcinoma in situ of right breast: Secondary | ICD-10-CM | POA: Diagnosis not present

## 2021-10-15 HISTORY — PX: EXCISION OF BREAST LESION: SHX6676

## 2021-10-15 HISTORY — PX: RE-EXCISION OF BREAST LUMPECTOMY: SHX6048

## 2021-10-15 SURGERY — EXCISION, LESION, BREAST
Anesthesia: General | Site: Breast | Laterality: Right

## 2021-10-15 MED ORDER — ONDANSETRON HCL 4 MG/2ML IJ SOLN
INTRAMUSCULAR | Status: DC | PRN
  Administered 2021-10-15: 4 mg via INTRAVENOUS

## 2021-10-15 MED ORDER — CHLORHEXIDINE GLUCONATE 0.12 % MT SOLN
OROMUCOSAL | Status: AC
  Administered 2021-10-15: 15 mL via OROMUCOSAL
  Filled 2021-10-15: qty 15

## 2021-10-15 MED ORDER — BUPIVACAINE-EPINEPHRINE (PF) 0.25% -1:200000 IJ SOLN
INTRAMUSCULAR | Status: AC
  Filled 2021-10-15: qty 30

## 2021-10-15 MED ORDER — CHLORHEXIDINE GLUCONATE CLOTH 2 % EX PADS: 6.0000 | MEDICATED_PAD | Freq: Once | CUTANEOUS | Status: DC

## 2021-10-15 MED ORDER — MIDAZOLAM HCL 2 MG/2ML IJ SOLN
INTRAMUSCULAR | Status: DC | PRN
  Administered 2021-10-15: 2 mg via INTRAVENOUS

## 2021-10-15 MED ORDER — ORAL CARE MOUTH RINSE
15.0000 mL | Freq: Once | OROMUCOSAL | Status: AC
Start: 2021-10-15 — End: 2021-10-15

## 2021-10-15 MED ORDER — LACTATED RINGERS IV SOLN: INTRAVENOUS | Status: DC

## 2021-10-15 MED ORDER — ACETAMINOPHEN 500 MG PO TABS
ORAL_TABLET | ORAL | Status: AC
  Administered 2021-10-15: 1000 mg via ORAL
  Filled 2021-10-15: qty 2

## 2021-10-15 MED ORDER — EPHEDRINE SULFATE-NACL 50-0.9 MG/10ML-% IV SOSY
PREFILLED_SYRINGE | INTRAVENOUS | Status: DC | PRN
  Administered 2021-10-15: 10 mg via INTRAVENOUS

## 2021-10-15 MED ORDER — LIDOCAINE HCL 1 % IJ SOLN
INTRAMUSCULAR | Status: DC | PRN
  Administered 2021-10-15: 40 mL

## 2021-10-15 MED ORDER — CEFAZOLIN SODIUM-DEXTROSE 2-4 GM/100ML-% IV SOLN
2.0000 g | INTRAVENOUS | Status: AC
  Administered 2021-10-15: 2 g via INTRAVENOUS

## 2021-10-15 MED ORDER — FENTANYL CITRATE (PF) 250 MCG/5ML IJ SOLN
INTRAMUSCULAR | Status: AC
  Filled 2021-10-15: qty 5

## 2021-10-15 MED ORDER — LIDOCAINE 2% (20 MG/ML) 5 ML SYRINGE
INTRAMUSCULAR | Status: DC | PRN
  Administered 2021-10-15: 60 mg via INTRAVENOUS

## 2021-10-15 MED ORDER — FENTANYL CITRATE (PF) 250 MCG/5ML IJ SOLN
INTRAMUSCULAR | Status: DC | PRN
Start: 2021-10-15 — End: 2021-10-15
  Administered 2021-10-15 (×3): 50 ug via INTRAVENOUS

## 2021-10-15 MED ORDER — CEFAZOLIN SODIUM-DEXTROSE 2-4 GM/100ML-% IV SOLN
INTRAVENOUS | Status: AC
  Filled 2021-10-15: qty 100

## 2021-10-15 MED ORDER — DEXAMETHASONE SODIUM PHOSPHATE 10 MG/ML IJ SOLN
INTRAMUSCULAR | Status: DC | PRN
  Administered 2021-10-15: 10 mg via INTRAVENOUS

## 2021-10-15 MED ORDER — LIDOCAINE HCL (PF) 1 % IJ SOLN
INTRAMUSCULAR | Status: AC
  Filled 2021-10-15: qty 30

## 2021-10-15 MED ORDER — DEXAMETHASONE SODIUM PHOSPHATE 10 MG/ML IJ SOLN
INTRAMUSCULAR | Status: AC
Start: 2021-10-15 — End: ?
  Filled 2021-10-15: qty 1

## 2021-10-15 MED ORDER — PROPOFOL 10 MG/ML IV BOLUS
INTRAVENOUS | Status: AC
Start: 2021-10-15 — End: ?
  Filled 2021-10-15: qty 20

## 2021-10-15 MED ORDER — CHLORHEXIDINE GLUCONATE 0.12 % MT SOLN: 15.0000 mL | Freq: Once | OROMUCOSAL | Status: AC

## 2021-10-15 MED ORDER — ENSURE PRE-SURGERY PO LIQD: 296.0000 mL | Freq: Once | ORAL | Status: DC

## 2021-10-15 MED ORDER — ACETAMINOPHEN 500 MG PO TABS: 1000.0000 mg | ORAL_TABLET | ORAL | Status: AC

## 2021-10-15 MED ORDER — KETOROLAC TROMETHAMINE 30 MG/ML IJ SOLN
INTRAMUSCULAR | Status: DC | PRN
  Administered 2021-10-15: 30 mg via INTRAVENOUS

## 2021-10-15 MED ORDER — ONDANSETRON HCL 4 MG/2ML IJ SOLN
INTRAMUSCULAR | Status: AC
Start: 2021-10-15 — End: ?
  Filled 2021-10-15: qty 2

## 2021-10-15 MED ORDER — PROPOFOL 10 MG/ML IV BOLUS
INTRAVENOUS | Status: DC | PRN
  Administered 2021-10-15: 200 mg via INTRAVENOUS

## 2021-10-15 MED ORDER — MIDAZOLAM HCL 2 MG/2ML IJ SOLN
INTRAMUSCULAR | Status: AC
  Filled 2021-10-15: qty 2

## 2021-10-15 MED ORDER — 0.9 % SODIUM CHLORIDE (POUR BTL) OPTIME
TOPICAL | Status: DC | PRN
  Administered 2021-10-15: 1000 mL

## 2021-10-15 SURGICAL SUPPLY — 40 items
BAG COUNTER SPONGE SURGICOUNT (BAG) ×3 IMPLANT
BINDER BREAST XLRG (GAUZE/BANDAGES/DRESSINGS) ×1 IMPLANT
CANISTER SUCT 3000ML PPV (MISCELLANEOUS) ×3 IMPLANT
CHLORAPREP W/TINT 26 (MISCELLANEOUS) ×3 IMPLANT
CLIP TI LARGE 6 (CLIP) ×1 IMPLANT
CNTNR URN SCR LID CUP LEK RST (MISCELLANEOUS) ×2 IMPLANT
CONT SPEC 4OZ STRL OR WHT (MISCELLANEOUS) ×9
COVER SURGICAL LIGHT HANDLE (MISCELLANEOUS) ×3 IMPLANT
DERMABOND ADVANCED (GAUZE/BANDAGES/DRESSINGS) ×1
DERMABOND ADVANCED .7 DNX12 (GAUZE/BANDAGES/DRESSINGS) ×2 IMPLANT
DRAPE CHEST BREAST 15X10 FENES (DRAPES) ×3 IMPLANT
DRSG PAD ABDOMINAL 8X10 ST (GAUZE/BANDAGES/DRESSINGS) ×2 IMPLANT
ELECT REM PT RETURN 9FT ADLT (ELECTROSURGICAL) ×3
ELECTRODE REM PT RTRN 9FT ADLT (ELECTROSURGICAL) ×2 IMPLANT
GAUZE SPONGE 4X4 12PLY STRL (GAUZE/BANDAGES/DRESSINGS) ×2 IMPLANT
GLOVE BIO SURGEON STRL SZ 6 (GLOVE) ×3 IMPLANT
GLOVE INDICATOR 6.5 STRL GRN (GLOVE) ×3 IMPLANT
GOWN STRL REUS W/ TWL LRG LVL3 (GOWN DISPOSABLE) ×2 IMPLANT
GOWN STRL REUS W/TWL 2XL LVL3 (GOWN DISPOSABLE) ×3 IMPLANT
GOWN STRL REUS W/TWL LRG LVL3 (GOWN DISPOSABLE) ×6
ILLUMINATOR WAVEGUIDE N/F (MISCELLANEOUS) IMPLANT
KIT BASIN OR (CUSTOM PROCEDURE TRAY) ×3 IMPLANT
KIT MARKER MARGIN INK (KITS) ×1 IMPLANT
KIT TURNOVER KIT B (KITS) ×3 IMPLANT
LIGHT WAVEGUIDE WIDE FLAT (MISCELLANEOUS) IMPLANT
NDL HYPO 25GX1X1/2 BEV (NEEDLE) ×2 IMPLANT
NEEDLE HYPO 25GX1X1/2 BEV (NEEDLE) ×3 IMPLANT
NS IRRIG 1000ML POUR BTL (IV SOLUTION) ×3 IMPLANT
PACK GENERAL/GYN (CUSTOM PROCEDURE TRAY) ×3 IMPLANT
PAD ABD 8X10 STRL (GAUZE/BANDAGES/DRESSINGS) ×1 IMPLANT
PAD ARMBOARD 7.5X6 YLW CONV (MISCELLANEOUS) ×3 IMPLANT
STRIP CLOSURE SKIN 1/2X4 (GAUZE/BANDAGES/DRESSINGS) ×3 IMPLANT
STRIP CLOSURE SKIN 1/4X4 (GAUZE/BANDAGES/DRESSINGS) ×1 IMPLANT
SUT MNCRL AB 4-0 PS2 18 (SUTURE) ×3 IMPLANT
SUT SILK 2 0 PERMA HAND 18 BK (SUTURE) ×1 IMPLANT
SUT VIC AB 3-0 SH 27 (SUTURE) ×3
SUT VIC AB 3-0 SH 27X BRD (SUTURE) ×2 IMPLANT
SYR CONTROL 10ML LL (SYRINGE) ×3 IMPLANT
TOWEL GREEN STERILE (TOWEL DISPOSABLE) ×3 IMPLANT
TOWEL GREEN STERILE FF (TOWEL DISPOSABLE) ×3 IMPLANT

## 2021-10-15 NOTE — Anesthesia Procedure Notes (Signed)
Procedure Name: LMA Insertion Date/Time: 10/15/2021 11:15 AM  Performed by: Lance Coon, CRNAPre-anesthesia Checklist: Patient identified, Emergency Drugs available, Suction available, Patient being monitored and Timeout performed Patient Re-evaluated:Patient Re-evaluated prior to induction Oxygen Delivery Method: Circle system utilized Preoxygenation: Pre-oxygenation with 100% oxygen Induction Type: IV induction LMA: LMA inserted LMA Size: 3.0 Number of attempts: 1 Placement Confirmation: breath sounds checked- equal and bilateral and positive ETCO2 Tube secured with: Tape Dental Injury: Teeth and Oropharynx as per pre-operative assessment

## 2021-10-15 NOTE — Anesthesia Preprocedure Evaluation (Signed)
Anesthesia Evaluation  Patient identified by MRN, date of birth, ID band Patient awake    Reviewed: Allergy & Precautions, NPO status , Patient's Chart, lab work & pertinent test results  Airway Mallampati: II  TM Distance: >3 FB Neck ROM: Full    Dental no notable dental hx. (+) Dental Advisory Given, Teeth Intact   Pulmonary neg pulmonary ROS,    Pulmonary exam normal        Cardiovascular hypertension, Pt. on medications Normal cardiovascular exam  Echo 09/2021 1. Left ventricular ejection fraction, by estimation, is 55 to 60%. The left ventricle has normal function. The left ventricle has no regional wall motion abnormalities. Left ventricular diastolic parameters were normal. The average left ventricular global longitudinal strain is -23.1 %. The global longitudinal strain is normal.  2. Right ventricular systolic function is normal. The right ventricular size is normal. There is normal pulmonary artery systolic pressure.  3. The mitral valve is normal in structure. Trivial mitral valve regurgitation. No evidence of mitral stenosis.  4. The aortic valve is tricuspid. There is mild calcification of the aortic valve. Aortic valve regurgitation is not visualized. No aortic stenosis is present.  5. Aortic dilatation noted. There is borderline dilatation of the ascending aorta, measuring 38 mm.  6. The inferior vena cava is normal in size with greater than 50% respiratory variability, suggesting right atrial pressure of 3 mmHg   Neuro/Psych negative neurological ROS     GI/Hepatic negative GI ROS, Neg liver ROS,   Endo/Other  negative endocrine ROS  Renal/GU negative Renal ROS     Musculoskeletal negative musculoskeletal ROS (+)   Abdominal   Peds  Hematology negative hematology ROS (+)   Anesthesia Other Findings   Reproductive/Obstetrics                             Anesthesia  Physical  Anesthesia Plan  ASA: 2  Anesthesia Plan: General   Post-op Pain Management: Tylenol PO (pre-op)* and Toradol IV (intra-op)*   Induction: Intravenous  PONV Risk Score and Plan: 3 and Ondansetron, Dexamethasone and Treatment may vary due to age or medical condition  Airway Management Planned: LMA  Additional Equipment: None  Intra-op Plan:   Post-operative Plan: Extubation in OR  Informed Consent: I have reviewed the patients History and Physical, chart, labs and discussed the procedure including the risks, benefits and alternatives for the proposed anesthesia with the patient or authorized representative who has indicated his/her understanding and acceptance.     Dental advisory given  Plan Discussed with:   Anesthesia Plan Comments:         Anesthesia Quick Evaluation

## 2021-10-15 NOTE — Transfer of Care (Signed)
Immediate Anesthesia Transfer of Care Note  Patient: Rhonda Richardson  Procedure(s) Performed: RE-EXCISION LUMPECTOMY RIGHT BREAST (Right: Breast) ASPIRATION RIGHT AXILLARY SEROMA (Right: Axilla)  Patient Location: PACU  Anesthesia Type:General  Level of Consciousness: drowsy and patient cooperative  Airway & Oxygen Therapy: Patient Spontanous Breathing and Patient connected to nasal cannula oxygen  Post-op Assessment: Report given to RN and Post -op Vital signs reviewed and stable  Post vital signs: Reviewed and stable  Last Vitals:  Vitals Value Taken Time  BP 121/59 10/15/21 1220  Temp    Pulse 96 10/15/21 1222  Resp    SpO2 91 % 10/15/21 1222  Vitals shown include unvalidated device data.  Last Pain:  Vitals:   10/15/21 1042  TempSrc:   PainSc: 0-No pain      Patients Stated Pain Goal: 3 (19/41/74 0814)  Complications: No notable events documented.

## 2021-10-15 NOTE — Discharge Instructions (Addendum)
[facility] Office Phone Number [phone]  BREAST BIOPSY/ PARTIAL MASTECTOMY: POST OP INSTRUCTIONS  Always review your discharge instruction sheet given to you by the facility where your surgery was performed.  IF YOU HAVE DISABILITY OR FAMILY LEAVE FORMS, YOU MUST BRING THEM TO THE OFFICE FOR PROCESSING.  DO NOT GIVE THEM TO YOUR DOCTOR.  Take 2 tylenol (acetominophen) three times a day for 3 days.  If you still have pain, add ibuprofen with food in between if able to take this (if you have kidney issues or stomach issues, do not take ibuprofen).  If both of those are not enough, add the narcotic pain pill.  If you find you are needing a lot of this overnight after surgery, call the next morning for a refill.    Prescriptions will not be filled after 5pm or on week-ends. Take your usually prescribed medications unless otherwise directed You should eat very light the first 24 hours after surgery, such as soup, crackers, pudding, etc.  Resume your normal diet the day after surgery. Most patients will experience some swelling and bruising in the breast.  Ice packs and a good support bra will help.  Swelling and bruising can take several days to resolve.  It is common to experience some constipation if taking pain medication after surgery.  Increasing fluid intake and taking a stool softener will usually help or prevent this problem from occurring.  A mild laxative (Milk of Magnesia or Miralax) should be taken according to package directions if there are no bowel movements after 48 hours. Unless discharge instructions indicate otherwise, you may remove your bandages 48 hours after surgery, and you may shower at that time.  You may have steri-strips (small skin tapes) in place directly over the incision.  These strips should be left on the skin at least for for 7-10 days.    ACTIVITIES:  You may resume regular daily activities (gradually increasing) beginning the next day.  Wearing a  good support bra or sports bra (or the breast binder) minimizes pain and swelling.  You may have sexual intercourse when it is comfortable. No heavy lifting for 1-2 weeks (not over around 10 pounds).  You may drive when you no longer are taking prescription pain medication, you can comfortably wear a seatbelt, and you can safely maneuver your car and apply brakes. RETURN TO WORK:  __________3-14 days depending on job. _______________ You should see your doctor in the office for a follow-up appointment approximately two weeks after your surgery.  Your doctor's nurse will typically make your follow-up appointment when she calls you with your pathology report.  Expect your pathology report 3-4 business days after your surgery.  You may call to check if you do not hear from us after three days.   WHEN TO CALL YOUR DOCTOR: Fever over 101.0 Nausea and/or vomiting. Extreme swelling or bruising. Continued bleeding from incision. Increased pain, redness, or drainage from the incision.  The clinic staff is available to answer your questions during regular business hours.  Please don't hesitate to call and ask to speak to one of the nurses for clinical concerns.  If you have a medical emergency, go to the nearest emergency room or call 911.  A surgeon from [facility] is always on call at the hospital.  For further questions, please visit [email or web address]   

## 2021-10-15 NOTE — Interval H&P Note (Signed)
History and Physical Interval Note:  10/15/2021 10:57 AM  Rhonda Richardson  has presented today for surgery, with the diagnosis of RIGHT BREAST CANCER.  The various methods of treatment have been discussed with the patient and family. After consideration of risks, benefits and other options for treatment, the patient has consented to  Procedure(s): RE-EXCISION LUMPECTOMY RIGHT BREAST (Right) ASPIRATION RIGHT AXILLARY SEROMA (Right) as a surgical intervention.  The patient's history has been reviewed, patient examined, no change in status, stable for surgery.  I have reviewed the patient's chart and labs.  Questions were answered to the patient's satisfaction.     [name]

## 2021-10-15 NOTE — Op Note (Signed)
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 +/+/+  Post-operative Diagnosis: right breast cancer   Surgeon: [name]   Assistants: n/a   Anesthesia: General anesthesia and Local anesthesia   ASA Class: 2   Procedure Details  The patient was seen in the Holding Room. The risks, benefits, complications, treatment options, and expected outcomes were discussed with the patient. The possibilities of reaction to medication, pulmonary aspiration, bleeding, infection, the need for additional procedures, failure to diagnose a condition, and creating a complication requiring transfusion or operation were discussed with the patient. The patient concurred with the proposed plan, giving informed consent. The site of surgery properly noted/marked. The patient was taken to Operating Room # 9, identified, and the procedure verified as re-excision of right breast cancer.  After induction of anesthesia, the right breast and chest were prepped and draped in standard fashion. The right axilla was aspirated, but only around 10 mL was present. The lumpectomy was performed by reopening the prior incision. Seroma was aspirated. The mastopexy sutures were removed. Additional margins were taken at the inferior, posterior, and medial borders of the partial mastectomy cavity. Dissection was carried down to the pectoral fascia. The specimens were marked with the Margin marker paint kit as appropriate. Hemostasis was achieved with cautery. Local anesthetic The wound was irrigated and closed with a 3-0 Vicryl deep dermal interrupted and a 4-0 Monocryl subcuticular closure in layers.  Sterile dressings were applied. At the end of the operation, all sponge, instrument, and needle counts were  correct.   Findings:  grossly clear surgical margins, posterior margin is pectoralis  Estimated Blood Loss: Minimal   Drains: none   Specimens: additional medial, posterior, and inferior margins.   Complications: None; patient tolerated the procedure well.   Disposition: PACU - hemodynamically stable.   Condition: stable

## 2021-10-15 NOTE — Anesthesia Postprocedure Evaluation (Signed)
Anesthesia Post Note  Patient: Rhonda Richardson  Procedure(s) Performed: RE-EXCISION LUMPECTOMY RIGHT BREAST (Right: Breast) ASPIRATION RIGHT AXILLARY SEROMA (Right: Axilla)     Patient location during evaluation: PACU Anesthesia Type: General Level of consciousness: awake and alert Pain management: pain level controlled Vital Signs Assessment: post-procedure vital signs reviewed and stable Respiratory status: spontaneous breathing, nonlabored ventilation and respiratory function stable Cardiovascular status: blood pressure returned to baseline and stable Postop Assessment: no apparent nausea or vomiting Anesthetic complications: no   No notable events documented.  Last Vitals:  Vitals:   10/15/21 1250 10/15/21 1300  BP:  134/64  Pulse: 86 81  Resp: 15 12  Temp:  36.7 C  SpO2: 95% 96%    Last Pain:  Vitals:   10/15/21 1250  TempSrc:   PainSc: 0-No pain                 Lidia Collum

## 2021-10-16 ENCOUNTER — Encounter (HOSPITAL_COMMUNITY): Payer: Self-pay | Admitting: General Surgery

## 2021-10-19 ENCOUNTER — Ambulatory Visit: Payer: Medicare Other | Admitting: Sports Medicine

## 2021-10-19 LAB — SURGICAL PATHOLOGY

## 2021-10-20 ENCOUNTER — Encounter: Payer: Self-pay | Admitting: *Deleted

## 2021-11-02 ENCOUNTER — Inpatient Hospital Stay: Payer: Medicare Other | Attending: Hematology and Oncology | Admitting: Hematology and Oncology

## 2021-11-02 ENCOUNTER — Other Ambulatory Visit: Payer: Self-pay

## 2021-11-02 ENCOUNTER — Encounter: Payer: Self-pay | Admitting: Hematology and Oncology

## 2021-11-02 DIAGNOSIS — I10 Essential (primary) hypertension: Secondary | ICD-10-CM | POA: Insufficient documentation

## 2021-11-02 DIAGNOSIS — Z170 Estrogen receptor positive status [ER+]: Secondary | ICD-10-CM | POA: Diagnosis not present

## 2021-11-02 DIAGNOSIS — Z79899 Other long term (current) drug therapy: Secondary | ICD-10-CM | POA: Diagnosis not present

## 2021-11-02 DIAGNOSIS — C50411 Malignant neoplasm of upper-outer quadrant of right female breast: Secondary | ICD-10-CM | POA: Diagnosis not present

## 2021-11-02 NOTE — Assessment & Plan Note (Signed)
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  She currently has 2 masses located at 9:30 position and 10 o'clock position each measuring 1.5 and 1.4 cm respectively.  She had right breast lumpectomy which showed 3.6 cm tumor, triple positive, positive margins, negative SLN involvement. She is going for margin excision on July 6 th. Given large tumor, triple positive, we discussed dose modified TCH for 6 cycles followed by adjuvant herceptin vs taxol with herceptin. She wants to try dose modified TCH Most recent ECHO satisfactory She had port  Placed by Dr Barry Dienes during her first surgery. Anticipate for cycle of chemotherapy on 11/10/2021.  She will have labs on the day of infusion.  We have once again discussed about adverse effects of chemotherapy including but not limited to fatigue, nausea, diarrhea, increased risk of infections, arthralgias from Neulasta and the need to call us with any questionable symptoms or concerns.  She expressed understanding.  She will return to clinic 1 week after first cycle of chemotherapy for toxicity check with one of her APP's.  Return to clinic with me before planned cycle 2 of [place].

## 2021-11-02 NOTE — Progress Notes (Signed)
[facility] NOTE  Patient Care Team: Kelton Pillar, MD as PCP - General (Family Medicine) [name], MD as Consulting Physician (General Surgery) Benay Pike, MD as Consulting Physician (Hematology and Oncology) Kyung Rudd, MD as Consulting Physician (Radiation Oncology) Mauro Kaufmann, RN as Oncology Nurse Navigator Rockwell Germany, RN as Oncology Nurse Navigator  CHIEF COMPLAINTS/PURPOSE OF CONSULTATION:  Newly diagnosed breast cancer  HISTORY OF PRESENTING ILLNESS:  Rhonda Richardson 81 y.o. female is here because of recent diagnosis of right breast cancer.  I reviewed her records extensively and collaborated the history with the patient.  SUMMARY OF ONCOLOGIC HISTORY: Oncology History  Malignant neoplasm of upper-outer quadrant of right breast in female, estrogen receptor positive ([place])  08/12/2021 Mammogram   Mammogram showed possible mass in the right breast.  Diagnostic mammogram showed 2 highly suspicious upper outer right breast masses measuring 1.5 cm in the 9:30 position and 1.4 cm at the 10 o'clock position.  These masses or distortions mammographically spanning a distance of up to 3.5 cm.  No abnormal appearing right axillary lymph nodes.   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  Prognostic showed ER 99% positive strong staining PR 10% positive moderate staining, HER2 positive 3+ and Ki-67 of 30%.   09/08/2021 Initial Diagnosis   Malignant neoplasm of upper-outer quadrant of right breast in female, estrogen receptor positive ([place])   09/15/2021 Pathology Results   She had right breast lumpectomy which showed invasive ductal carcinoma, 3.6 cm in maximal extent involving inferior and medial margins, DCIS approaching to less than 0.1 cm of closest margin no metastatic carcinoma identified in 4 out of 4 lymph nodes.  She is scheduled for repeat surgery given positive  margins.  Prior prognostic showed ER 99% positive strong staining intensity PR 10% positive moderate staining intensity, HER2 positive.   09/23/2021 Genetic Testing   Negative hereditary cancer genetic testing: no pathogenic variants detected in [facility] CustomNext-cancer +RNAinsight Panel.  Report date is September 23, 2021.   The CustomNext-Cancer+RNAinsight panel offered by [facility] includes sequencing and rearrangement analysis for the following 47 genes:  APC, ATM, AXIN2, BARD1, BMPR1A, BRCA1, BRCA2, BRIP1, CDH1, CDK4, CDKN2A, CHEK2, DICER1, EPCAM, GREM1, HOXB13, MEN1, MLH1, MSH2, MSH3, MSH6, MUTYH, NBN, NF1, NF2, NTHL1, PALB2, PMS2, POLD1, POLE, PTEN, RAD51C, RAD51D, RECQL, RET, SDHA, SDHAF2, SDHB, SDHC, SDHD, SMAD4, SMARCA4, STK11, TP53, TSC1, TSC2, and VHL.  RNA data is routinely analyzed for use in variant interpretation for all genes.   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 DCIS 0.25 mg from true/new margin.   11/10/2021 -  Chemotherapy   Patient is on Treatment Plan : BREAST Docetaxel + Carboplatin + Trastuzumab (TCH) q21d / Trastuzumab q21d      Interval History  Ms. Cecille Rubin is here before planned chemotherapy. She is scheduled for first cycle of chemotherapy on August 1.  She has recovered well from the reexcision. She had some postop seroma which has been drained. Rest of the pertinent 10 point ROS reviewed and negative.  MEDICAL HISTORY:  Past Medical History:  Diagnosis Date   Breast cancer ([place])    Hypertension     SURGICAL HISTORY: Past Surgical History:  Procedure Laterality Date   BREAST LUMPECTOMY WITH RADIOACTIVE SEED AND SENTINEL LYMPH NODE BIOPSY Right 09/15/2021   Procedure:  RIGHT BREAST SEED BRACKETED LUMPECTOMY AND SENTINEL NODE BIOPSY;  Surgeon: [name], MD;  Location: [place];  Service: General;   Laterality: Right;   cataract surgery Bilateral    DILATION AND CURETTAGE OF UTERUS  1970   EXCISION OF BREAST LESION Right 10/15/2021   Procedure: ASPIRATION RIGHT AXILLARY SEROMA;  Surgeon: [name], MD;  Location: [place];  Service: General;  Laterality: Right;   eyelid tendon repair Bilateral    PORTACATH PLACEMENT N/A 09/15/2021   Procedure: PORT PLACEMENT;  Surgeon: [name], MD;  Location: [place];  Service: General;  Laterality: N/A;   RE-EXCISION OF BREAST LUMPECTOMY Right 10/15/2021   Procedure: RE-EXCISION LUMPECTOMY RIGHT BREAST;  Surgeon: [name], MD;  Location: [place];  Service: General;  Laterality: Right;    SOCIAL HISTORY: Social History   Socioeconomic History   Marital status: Married    Spouse name: Not on file   Number of children: Not on file   Years of education: Not on file   Highest education level: Not on file  Occupational History   Not on file  Tobacco Use   Smoking status: Never   Smokeless tobacco: Not on file  Vaping Use   Vaping Use: Never used  Substance and Sexual Activity   Alcohol use: Yes    Comment: 3- 4 drinks   Drug use: Never   Sexual activity: Not Currently  Other Topics Concern   Not on file  Social History Narrative   Not on file   Social Determinants of Health   Financial Resource Strain: Low Risk  (09/09/2021)   Overall Financial Resource Strain (CARDIA)    Difficulty of Paying Living Expenses: Not hard at all  Food Insecurity: No Food Insecurity (09/09/2021)   Hunger Vital Sign    Worried About Running Out of Food in the Last Year: Never true    Ran Out of Food in the Last Year: Never true  Transportation Needs: No Transportation Needs (09/09/2021)   PRAPARE - Hydrologist (Medical): No    Lack of Transportation (Non-Medical): No  Physical Activity: Not on file  Stress: Not on file  Social Connections: Not on file  Intimate Partner Violence: Not on file    FAMILY  HISTORY: Family History  Problem Relation Age of Onset   Heart disease Father 21   Thyroid cancer Other 39       niece's daughter    ALLERGIES:  is allergic to macrodantin [nitrofurantoin].  MEDICATIONS:  Current Outpatient Medications  Medication Sig Dispense Refill   acetaminophen (TYLENOL) 325 MG tablet Take 325 mg by mouth every 6 (six) hours as needed for moderate pain.     benazepril (LOTENSIN) 20 MG tablet Take 20 mg by mouth daily.     cholecalciferol (VITAMIN D3) 25 MCG (1000 UNIT) tablet Take 1,000 Units by mouth daily.     dexamethasone (DECADRON) 4 MG tablet Take 2 tablets (8 mg total) by mouth 2 (two) times daily. Take decadron  Twice daily the day before taxotere then daily x 3 days after carboplatin  chemo 30 tablet 1   famotidine (PEPCID) 20 MG tablet Take 20 mg by mouth daily as needed for heartburn or indigestion.     hydrochlorothiazide (MICROZIDE) 12.5 MG capsule Take 12.5 mg by mouth daily.     HYDROcodone-acetaminophen (NORCO/VICODIN) 5-325 MG tablet Take 1 tablet by mouth every 6 (six) hours as needed for moderate pain. (Patient not taking:  Reported on 10/01/2021) 5 tablet 0   ibuprofen (ADVIL) 200 MG tablet Take 200 mg by mouth every 6 (six) hours as needed for moderate pain.     lidocaine-prilocaine (EMLA) cream Apply to affected area once 30 g 3   Magnesium 250 MG TABS Take 250 mg by mouth daily.     ondansetron (ZOFRAN) 8 MG tablet Take 1 tablet (8 mg total) by mouth 2 (two) times daily as needed for refractory nausea / vomiting. Start on day 3 after chemo. 30 tablet 1   prochlorperazine (COMPAZINE) 10 MG tablet Take 1 tablet (10 mg total) by mouth every 6 (six) hours as needed (Nausea or vomiting). 30 tablet 1   Red Yeast Rice 600 MG CAPS Take 600 mg by mouth daily.     Vitamins-Lipotropics (MULTI-VITAMIN HP/MINERALS PO) Take 1 tablet by mouth daily.     No current facility-administered medications for this visit.    REVIEW OF SYSTEMS:   Constitutional:  Denies fevers, chills or abnormal night sweats Eyes: Denies blurriness of vision, double vision or watery eyes Ears, nose, mouth, throat, and face: Denies mucositis or sore throat Respiratory: Denies cough, dyspnea or wheezes Cardiovascular: Denies palpitation, chest discomfort or lower extremity swelling Gastrointestinal:  Denies nausea, heartburn or change in bowel habits Skin: Denies abnormal skin rashes Lymphatics: Denies new lymphadenopathy or easy bruising Neurological:Denies numbness, tingling or new weaknesses Behavioral/Psych: Mood is stable, no new changes  Breast: Denies any palpable lumps or discharge All other systems were reviewed with the patient and are negative.  PHYSICAL EXAMINATION: ECOG PERFORMANCE STATUS: 0 - Asymptomatic  Vitals:   11/02/21 0912  BP: (!) 173/78  Pulse: 86  Resp: 17  Temp: 97.7 F (36.5 C)  SpO2: 95%    Filed Weights   11/02/21 0912  Weight: 144 lb (65.3 kg)    Right breast status post lumpectomy and reexcision with some postop ecchymosis and seroma.  No other evidence of poor healing or infection   LABORATORY DATA:  I have reviewed the data as listed Lab Results  Component Value Date   WBC 6.3 10/08/2021   HGB 12.1 10/08/2021   HCT 37.3 10/08/2021   MCV 83.1 10/08/2021   PLT 298 10/08/2021   Lab Results  Component Value Date   NA 136 10/08/2021   K 4.1 10/08/2021   CL 102 10/08/2021   CO2 28 10/08/2021    RADIOGRAPHIC STUDIES: I have personally reviewed the radiological reports and agreed with the findings in the report.  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  She currently has 2 masses located at 9:30 position and  10 o'clock position each measuring 1.5 and 1.4 cm respectively.  She had right breast lumpectomy which showed 3.6 cm tumor, triple positive, positive margins, negative SLN involvement. She is going for margin excision on July 6 th. Given large tumor, triple positive, we discussed dose modified TCH for 6 cycles followed by adjuvant herceptin vs taxol with herceptin. She wants to try dose modified TCH Most recent ECHO satisfactory She had port  Placed by Dr Barry Dienes during her first surgery. Anticipate for cycle of chemotherapy on 11/10/2021.  She will have labs on the day of infusion.  We have once again discussed about  adverse effects of chemotherapy including but not limited to fatigue, nausea, diarrhea, increased risk of infections, arthralgias from Neulasta and the need to call us with any questionable symptoms or concerns.  She expressed understanding.  She will return to clinic 1 week after first cycle of chemotherapy for toxicity check with one of her APP's.  Return to clinic with me before planned cycle 2 of [place].  Total time spent: 30 minutes including history, physical, review of records, counseling and coordination of care All questions were answered. The patient knows to call the clinic with any problems, questions or concerns.    Benay Pike, MD 11/02/21

## 2021-11-03 ENCOUNTER — Encounter: Payer: Self-pay | Admitting: *Deleted

## 2021-11-03 NOTE — Progress Notes (Signed)
Pharmacist Chemotherapy Monitoring - Initial Assessment    Anticipated start date: 11/10/21   The following has been reviewed per standard work regarding the patient's treatment regimen: The patient's diagnosis, treatment plan and drug doses, and organ/hematologic function Lab orders and baseline tests specific to treatment regimen  The treatment plan start date, drug sequencing, and pre-medications Prior authorization status  Patient's documented medication list, including drug-drug interaction screen and prescriptions for anti-emetics and supportive care specific to the treatment regimen The drug concentrations, fluid compatibility, administration routes, and timing of the medications to be used The patient's access for treatment and lifetime cumulative dose history, if applicable  The patient's medication allergies and previous infusion related reactions, if applicable   Changes made to treatment plan:  N/A  Follow up needed:  N/A   Philomena Course, [place], 11/03/2021  11:47 AM

## 2021-11-04 ENCOUNTER — Other Ambulatory Visit: Payer: Self-pay

## 2021-11-09 ENCOUNTER — Ambulatory Visit: Payer: Medicare Other | Attending: General Surgery | Admitting: Physical Therapy

## 2021-11-09 ENCOUNTER — Encounter: Payer: Self-pay | Admitting: Physical Therapy

## 2021-11-09 ENCOUNTER — Other Ambulatory Visit: Payer: Self-pay | Admitting: *Deleted

## 2021-11-09 DIAGNOSIS — Z170 Estrogen receptor positive status [ER+]: Secondary | ICD-10-CM | POA: Insufficient documentation

## 2021-11-09 DIAGNOSIS — Z483 Aftercare following surgery for neoplasm: Secondary | ICD-10-CM | POA: Diagnosis not present

## 2021-11-09 DIAGNOSIS — R293 Abnormal posture: Secondary | ICD-10-CM | POA: Diagnosis not present

## 2021-11-09 DIAGNOSIS — C50411 Malignant neoplasm of upper-outer quadrant of right female breast: Secondary | ICD-10-CM | POA: Insufficient documentation

## 2021-11-09 DIAGNOSIS — M6289 Other specified disorders of muscle: Secondary | ICD-10-CM

## 2021-11-09 MED FILL — Fosaprepitant Dimeglumine For IV Infusion 150 MG (Base Eq): INTRAVENOUS | Qty: 5 | Status: AC

## 2021-11-09 MED FILL — Dexamethasone Sodium Phosphate Inj 100 MG/10ML: INTRAMUSCULAR | Qty: 1 | Status: AC

## 2021-11-09 NOTE — Patient Instructions (Addendum)
[facility]  [address], [address]  [place] [address]  [phone]  After Breast Cancer Class It is recommended you attend the ABC class to be educated on lymphedema risk reduction. This class is free of charge and lasts for 1 hour. It is a 1-time class. You will need to download the Webex app either on your phone or computer. We will send you a link the night before or the morning of the class. You should be able to click on that link to join the class. This is not a confidential class. You don't have to turn your camera on, but other participants may be able to see your email address. You are scheduled for September 18th.  Scar massage You can begin gentle scar massage to you incision sites. Gently place one hand on the incision and move the skin (without sliding on the skin) in various directions. Do this for a few minutes and then you can gently massage either coconut oil or vitamin E cream into the scars.  Compression garment You should continue wearing your compression bra until you feel like you no longer have swelling.  Home exercise Program Continue doing the exercises you were given until you feel like you can do them without feeling any tightness at the end.   Walking Program Studies show that 30 minutes of walking per day (fast enough to elevate your heart rate) can significantly reduce the risk of a cancer recurrence. If you can't walk due to other medical reasons, we encourage you to find another activity you could do (like a stationary bike or water exercise). When you don't feel great, try walking 10 minutes twice a day.  Posture After breast cancer surgery, people frequently sit with rounded shoulders posture because it puts their incisions on slack and feels better. If you sit like this and scar tissue forms in that position, you can become very tight and have pain sitting or standing with good posture. Try to be aware of your posture and sit and  stand up tall to heal properly.  Follow up PT: It is recommended you return every 3 months for the first 3 years following surgery to be assessed on the SOZO machine for an L-Dex score. This helps prevent clinically significant lymphedema in 95% of patients. These follow up screens are 10 minute appointments that you are not billed for. You are scheduled for September 25th at 10:00.  We talked about an electrolyte drink daily like Ultima packets you can get next to the refridgerated supplement section at [employer].  Try drinking 80 ounces of water each day!

## 2021-11-09 NOTE — Therapy (Addendum)
OUTPATIENT PHYSICAL THERAPY BREAST CANCER POST OP FOLLOW UP   Patient Name: Rhonda Richardson MRN: [id number] DOB:12/25/1940, 81 y.o., female Today's Date: 11/09/2021   PT End of Session - 11/09/21 1007     Visit Number 2    Number of Visits 2    PT Start Time 1005    PT Stop Time 1112    PT Time Calculation (min) 67 min    Activity Tolerance Patient tolerated treatment well    Behavior During Therapy WFL for tasks assessed/performed             Past Medical History:  Diagnosis Date   Breast cancer (HCC)    Hypertension    Past Surgical History:  Procedure Laterality Date   BREAST LUMPECTOMY WITH RADIOACTIVE SEED AND SENTINEL LYMPH NODE BIOPSY Right 09/15/2021   Procedure: RIGHT BREAST SEED BRACKETED LUMPECTOMY AND SENTINEL NODE BIOPSY;  Surgeon: Byerly, Faera, MD;  Location: [facility];  Service: General;  Laterality: Right;   cataract surgery Bilateral    DILATION AND CURETTAGE OF UTERUS  1970   EXCISION OF BREAST LESION Right 10/15/2021   Procedure: ASPIRATION RIGHT AXILLARY SEROMA;  Surgeon: Byerly, Faera, MD;  Location: [facility] OR;  Service: General;  Laterality: Right;   eyelid tendon repair Bilateral    PORTACATH PLACEMENT N/A 09/15/2021   Procedure: PORT PLACEMENT;  Surgeon: Byerly, Faera, MD;  Location: [facility];  Service: General;  Laterality: N/A;   RE-EXCISION OF BREAST LUMPECTOMY Right 10/15/2021   Procedure: RE-EXCISION LUMPECTOMY RIGHT BREAST;  Surgeon: Byerly, Faera, MD;  Location: [facility] OR;  Service: General;  Laterality: Right;   Patient Active Problem List   Diagnosis Date Noted   Genetic testing 09/28/2021   Malignant neoplasm of upper-outer quadrant of right breast in female, estrogen receptor positive (HCC) 09/08/2021    REFERRING PROVIDER: Dr. Faera Byerly  REFERRING DIAG: Right breast cancer   THERAPY DIAG:  Malignant neoplasm of upper-outer quadrant of right breast in female, estrogen receptor positive (HCC)  Abnormal  posture  Aftercare following surgery for neoplasm  Rationale for Evaluation and Treatment Rehabilitation  ONSET DATE: 09/15/2021  SUBJECTIVE:                                                                                                                                                                                           SUBJECTIVE STATEMENT: Patient reports she had a right lumpectomy and sentinel node biopsy (0/4 nodes positive) on 09/15/2021 followed by a re-excision on 10/15/2021. She will begin chemotherapy on 11/10/2021 followed by radiation and anti-estrogen therapy.  PERTINENT HISTORY:  Patient was diagnosed on 08/12/2021   with right grade 2 invasive ductal carcinoma breast cancer. She had a right lumpectomy and sentinel node biopsy (0/4 nodes positive) on 09/15/2021 followed by a re-excision on 10/15/2021. It is triple positive with a Ki67 of 30%.   PATIENT GOALS:  Reassess how my recovery is going related to arm function, pain, and swelling.  PAIN:  Are you having pain? No  PRECAUTIONS: Recent Surgery, right UE Lymphedema risk  ACTIVITY LEVEL / LEISURE: She has not returned to exercise yet.   OBJECTIVE:   PATIENT SURVEYS:  QUICK DASH:  Quick Dash - 11/09/21 0001     Open a tight or new jar No difficulty    Do heavy household chores (wash walls, wash floors) No difficulty    Carry a shopping bag or briefcase No difficulty    Wash your back No difficulty    Use a knife to cut food No difficulty    Recreational activities in which you take some force or impact through your arm, shoulder, or hand (golf, hammering, tennis) No difficulty    During the past week, to what extent has your arm, shoulder or hand problem interfered with your normal social activities with family, friends, neighbors, or groups? Not at all    During the past week, to what extent has your arm, shoulder or hand problem limited your work or other regular daily activities Not at all    Arm, shoulder, or hand  pain. None    Tingling (pins and needles) in your arm, shoulder, or hand None    Difficulty Sleeping No difficulty    DASH Score 0 %              OBSERVATIONS:  Right breast with seroma evident with palpable hardness present. Incision are well healed with no signs of infection. Bruising present in left breast from surgery. Applied compression foam to right lateral breast to reduce edema and educate pt on importance of wearing a supportive compressive bra to reduce edema.  POSTURE:  Forward head and rounded shoulders posture  LYMPHEDEMA ASSESSMENT:   UPPER EXTREMITY AROM/PROM:   A/PROM RIGHT   eval   RIGHT 11/09/2021  Shoulder extension 59 60  Shoulder flexion 140 142  Shoulder abduction 158 159  Shoulder internal rotation 46 77  Shoulder external rotation 82 82                          (Blank rows = not tested)   A/PROM LEFT   eval  Shoulder extension 59  Shoulder flexion 150  Shoulder abduction 177  Shoulder internal rotation 65  Shoulder external rotation 90                          (Blank rows = not tested)      UPPER EXTREMITY STRENGTH: WFL     LYMPHEDEMA ASSESSMENTS:    LANDMARK RIGHT   eval RIGHT 11/09/2021  10 cm proximal to olecranon process 26 26.4  Olecranon process 23.1 23.5  10 cm proximal to ulnar styloid process 19.2 18.7  Just proximal to ulnar styloid process 14.4 14.4  Across hand at thumb web space 17.8 17.5  At base of 2nd digit 5.8 5.7  (Blank rows = not tested)   LANDMARK LEFT   eval LEFT 11/09/2021  10 cm proximal to olecranon process 26.1 26  Olecranon process 22.9 23.2  10 cm proximal to ulnar styloid process 19.3 19.3    Just proximal to ulnar styloid process 14.2 14  Across hand at thumb web space 17.4 17  At base of 2nd digit 5.8 5.7  (Blank rows = not tested)       Surgery type/Date: Right lumpectomy and sentinel node biopsy 09/15/2021 Number of lymph nodes removed: 4 Current/past treatment (chemo, radiation, hormone  therapy): none Other symptoms:  Heaviness/tightness No Pain No Pitting edema No Infections No Decreased scar mobility Yes Stemmer sign No   PATIENT EDUCATION:  Education details: Walking program, lymphedema risk reduction education Person educated: Patient Education method: Explanation Education comprehension: verbalized understanding   HOME EXERCISE PROGRAM:  Reviewed previously given post op HEP.   ASSESSMENT:  CLINICAL IMPRESSION: Patient is doing very well s/p right lumpectomy and sentinel node biopsy on 09/15/2021. She recovered quickly from her re-excision on 10/15/2021 per her report. She is beginning chemotherapy tomorrow and had many concerns and questions regarding that but PT was able to answer those within her scope of practice. She has regained full shoulder ROM, shows no signs of lymphedema, and her incisions are healing well. She has no needs for PT related to breast cancer at this time. Patient reported concerns about vaginal dryness so she was scheduled for a pelvic floor assessment in September.  Pt will benefit from skilled therapeutic intervention to improve on the following deficits: Decreased knowledge of precautions, impaired UE functional use, pain, decreased ROM, postural dysfunction.   PT treatment/interventions: ADL/Self care home management, Therapeutic exercises, Patient/Family education, Self Care, and Re-evaluation     GOALS: Goals reviewed with patient? Yes  LONG TERM GOALS:  (STG=LTG)  GOALS Name Target Date  Goal status  1 Pt will demonstrate she has regained full shoulder ROM and function post operatively compared to baselines.  Baseline: 11/04/2021 MET     PLAN: PT FREQUENCY/DURATION: N/A   PLAN FOR NEXT SESSION: Patient will be discharged from rehab related to post op breast cancer. She has an appointment scheduled for a pelvic floor evaluation to address concerns related to vaginal dryness.  PHYSICAL THERAPY DISCHARGE  SUMMARY  Visits from Start of Care: 2  Current functional level: Goals met. See above for objective findings.   Remaining deficits: Swelling in right breast   Education / Equipment: HEP and lymphedema education   Patient agrees to discharge. Patient goals were met. Patient is being discharged due to meeting the stated rehab goals.   Annia Friendly, [place] 11/09/21 1:49 PM

## 2021-11-10 ENCOUNTER — Inpatient Hospital Stay: Payer: Medicare Other

## 2021-11-10 ENCOUNTER — Other Ambulatory Visit: Payer: Self-pay

## 2021-11-10 ENCOUNTER — Encounter: Payer: Self-pay | Admitting: *Deleted

## 2021-11-10 ENCOUNTER — Inpatient Hospital Stay: Payer: Medicare Other | Admitting: Physician Assistant

## 2021-11-10 ENCOUNTER — Inpatient Hospital Stay: Payer: Medicare Other | Attending: Hematology and Oncology

## 2021-11-10 VITALS — BP 135/75 | HR 65 | Temp 97.8°F | Resp 17 | Wt 142.5 lb

## 2021-11-10 DIAGNOSIS — C50411 Malignant neoplasm of upper-outer quadrant of right female breast: Secondary | ICD-10-CM | POA: Diagnosis not present

## 2021-11-10 DIAGNOSIS — Z95828 Presence of other vascular implants and grafts: Secondary | ICD-10-CM

## 2021-11-10 DIAGNOSIS — R12 Heartburn: Secondary | ICD-10-CM | POA: Diagnosis not present

## 2021-11-10 DIAGNOSIS — K219 Gastro-esophageal reflux disease without esophagitis: Secondary | ICD-10-CM | POA: Insufficient documentation

## 2021-11-10 DIAGNOSIS — Z5112 Encounter for antineoplastic immunotherapy: Secondary | ICD-10-CM | POA: Insufficient documentation

## 2021-11-10 DIAGNOSIS — R634 Abnormal weight loss: Secondary | ICD-10-CM | POA: Insufficient documentation

## 2021-11-10 DIAGNOSIS — Z881 Allergy status to other antibiotic agents status: Secondary | ICD-10-CM | POA: Diagnosis not present

## 2021-11-10 DIAGNOSIS — Z8249 Family history of ischemic heart disease and other diseases of the circulatory system: Secondary | ICD-10-CM | POA: Insufficient documentation

## 2021-11-10 DIAGNOSIS — K5900 Constipation, unspecified: Secondary | ICD-10-CM | POA: Insufficient documentation

## 2021-11-10 DIAGNOSIS — Z170 Estrogen receptor positive status [ER+]: Secondary | ICD-10-CM | POA: Insufficient documentation

## 2021-11-10 DIAGNOSIS — Z5111 Encounter for antineoplastic chemotherapy: Secondary | ICD-10-CM | POA: Diagnosis not present

## 2021-11-10 DIAGNOSIS — Z808 Family history of malignant neoplasm of other organs or systems: Secondary | ICD-10-CM | POA: Insufficient documentation

## 2021-11-10 DIAGNOSIS — Z5189 Encounter for other specified aftercare: Secondary | ICD-10-CM | POA: Diagnosis not present

## 2021-11-10 DIAGNOSIS — M79606 Pain in leg, unspecified: Secondary | ICD-10-CM | POA: Diagnosis not present

## 2021-11-10 DIAGNOSIS — Z79899 Other long term (current) drug therapy: Secondary | ICD-10-CM | POA: Diagnosis not present

## 2021-11-10 LAB — CMP (CANCER CENTER ONLY)
ALT: 14 U/L (ref 0–44)
AST: 14 U/L — ABNORMAL LOW (ref 15–41)
Albumin: 4.2 g/dL (ref 3.5–5.0)
Alkaline Phosphatase: 55 U/L (ref 38–126)
Anion gap: 9 (ref 5–15)
BUN: 24 mg/dL — ABNORMAL HIGH (ref 8–23)
CO2: 24 mmol/L (ref 22–32)
Calcium: 9.2 mg/dL (ref 8.9–10.3)
Chloride: 105 mmol/L (ref 98–111)
Creatinine: 0.74 mg/dL (ref 0.44–1.00)
GFR, Estimated: >60 mL/min (ref >60)
Glucose, Bld: 155 mg/dL — ABNORMAL HIGH (ref 70–99)
Potassium: 3.8 mmol/L (ref 3.5–5.1)
Sodium: 138 mmol/L (ref 135–145)
Total Bilirubin: 0.4 mg/dL (ref 0.3–1.2)
Total Protein: 7 g/dL (ref 6.5–8.1)

## 2021-11-10 LAB — CBC WITH DIFFERENTIAL (CANCER CENTER ONLY)
Abs Immature Granulocytes: 0.04 10*3/uL (ref 0.00–0.07)
Basophils Absolute: 0 10*3/uL (ref 0.0–0.1)
Basophils Relative: 0 %
Eosinophils Absolute: 0 10*3/uL (ref 0.0–0.5)
Eosinophils Relative: 0 %
HCT: 34.6 % — ABNORMAL LOW (ref 36.0–46.0)
Hemoglobin: 11.5 g/dL — ABNORMAL LOW (ref 12.0–15.0)
Immature Granulocytes: 0 %
Lymphocytes Relative: 8 %
Lymphs Abs: 0.8 10*3/uL (ref 0.7–4.0)
MCH: 26.4 pg (ref 26.0–34.0)
MCHC: 33.2 g/dL (ref 30.0–36.0)
MCV: 79.4 fL — ABNORMAL LOW (ref 80.0–100.0)
Monocytes Absolute: 0.2 10*3/uL (ref 0.1–1.0)
Monocytes Relative: 2 %
Neutro Abs: 9 10*3/uL — ABNORMAL HIGH (ref 1.7–7.7)
Neutrophils Relative %: 90 %
Platelet Count: 196 10*3/uL (ref 150–400)
RBC: 4.36 MIL/uL (ref 3.87–5.11)
RDW: 14.3 % (ref 11.5–15.5)
WBC Count: 10.1 10*3/uL (ref 4.0–10.5)
nRBC: 0 % (ref 0.0–0.2)

## 2021-11-10 MED ORDER — SODIUM CHLORIDE 0.9% FLUSH
10.0000 mL | INTRAVENOUS | Status: AC | PRN
  Administered 2021-11-10: 10 mL

## 2021-11-10 MED ORDER — HEPARIN SOD (PORK) LOCK FLUSH 100 UNIT/ML IV SOLN
500.0000 [IU] | Freq: Once | INTRAVENOUS | Status: AC | PRN
  Administered 2021-11-10: 500 [IU]

## 2021-11-10 MED ORDER — SODIUM CHLORIDE 0.9 % IV SOLN
150.0000 mg | Freq: Once | INTRAVENOUS | Status: AC
  Administered 2021-11-10: 150 mg via INTRAVENOUS
  Filled 2021-11-10: qty 150

## 2021-11-10 MED ORDER — SODIUM CHLORIDE 0.9 % IV SOLN
50.0000 mg/m2 | Freq: Once | INTRAVENOUS | Status: AC
  Administered 2021-11-10: 90 mg via INTRAVENOUS
  Filled 2021-11-10: qty 9

## 2021-11-10 MED ORDER — SODIUM CHLORIDE 0.9 % IV SOLN
10.0000 mg | Freq: Once | INTRAVENOUS | Status: AC
  Administered 2021-11-10: 10 mg via INTRAVENOUS
  Filled 2021-11-10: qty 10

## 2021-11-10 MED ORDER — DIPHENHYDRAMINE HCL 25 MG PO CAPS
50.0000 mg | ORAL_CAPSULE | Freq: Once | ORAL | Status: AC
  Administered 2021-11-10: 50 mg via ORAL
  Filled 2021-11-10: qty 2

## 2021-11-10 MED ORDER — SODIUM CHLORIDE 0.9 % IV SOLN
291.6000 mg | Freq: Once | INTRAVENOUS | Status: AC
  Administered 2021-11-10: 290 mg via INTRAVENOUS
  Filled 2021-11-10: qty 29

## 2021-11-10 MED ORDER — PALONOSETRON HCL INJECTION 0.25 MG/5ML
0.2500 mg | Freq: Once | INTRAVENOUS | Status: AC
  Administered 2021-11-10: 0.25 mg via INTRAVENOUS
  Filled 2021-11-10: qty 5

## 2021-11-10 MED ORDER — TRASTUZUMAB-DKST CHEMO 150 MG IV SOLR
8.0000 mg/kg | Freq: Once | INTRAVENOUS | Status: AC
  Administered 2021-11-10: 525 mg via INTRAVENOUS
  Filled 2021-11-10: qty 25

## 2021-11-10 MED ORDER — ACETAMINOPHEN 325 MG PO TABS
650.0000 mg | ORAL_TABLET | Freq: Once | ORAL | Status: AC
  Administered 2021-11-10: 650 mg via ORAL
  Filled 2021-11-10: qty 2

## 2021-11-10 MED ORDER — SODIUM CHLORIDE 0.9 % IV SOLN: Freq: Once | INTRAVENOUS | Status: AC

## 2021-11-10 MED ORDER — SODIUM CHLORIDE 0.9% FLUSH
10.0000 mL | INTRAVENOUS | Status: DC | PRN
  Administered 2021-11-10: 10 mL

## 2021-11-10 NOTE — Patient Instructions (Signed)
[facility]  Discharge Instructions: Thank you for choosing [facility] to provide your oncology and hematology care.   If you have a lab appointment with the [facility], please go directly to the [facility] and check in at the registration area.   Wear comfortable clothing and clothing appropriate for easy access to any Portacath or PICC line.   We strive to give you quality time with your provider. You may need to reschedule your appointment if you arrive late (15 or more minutes).  Arriving late affects you and other patients whose appointments are after yours.  Also, if you miss three or more appointments without notifying the office, you may be dismissed from the clinic at the provider's discretion.      For prescription refill requests, have your pharmacy contact our office and allow 72 hours for refills to be completed.    Today you received the following chemotherapy and/or immunotherapy agents: Herceptin, Docetaxel, Carboplatin.      To help prevent nausea and vomiting after your treatment, we encourage you to take your nausea medication as directed.  BELOW ARE SYMPTOMS THAT SHOULD BE REPORTED IMMEDIATELY: *FEVER GREATER THAN 100.4 F (38 C) OR HIGHER *CHILLS OR SWEATING *NAUSEA AND VOMITING THAT IS NOT CONTROLLED WITH YOUR NAUSEA MEDICATION *UNUSUAL SHORTNESS OF BREATH *UNUSUAL BRUISING OR BLEEDING *URINARY PROBLEMS (pain or burning when urinating, or frequent urination) *BOWEL PROBLEMS (unusual diarrhea, constipation, pain near the anus) TENDERNESS IN MOUTH AND THROAT WITH OR WITHOUT PRESENCE OF ULCERS (sore throat, sores in mouth, or a toothache) UNUSUAL RASH, SWELLING OR PAIN  UNUSUAL VAGINAL DISCHARGE OR ITCHING   Items with * indicate a potential emergency and should be followed up as soon as possible or go to the Emergency Department if any problems should occur.  Please show the CHEMOTHERAPY ALERT CARD or IMMUNOTHERAPY  ALERT CARD at check-in to the Emergency Department and triage nurse.  Should you have questions after your visit or need to cancel or reschedule your appointment, please contact [facility]  Dept: [phone]  and follow the prompts.  Office hours are 8:00 a.m. to 4:30 p.m. Monday - Friday. Please note that voicemails left after 4:00 p.m. may not be returned until the following business day.  We are closed weekends and major holidays. You have access to a nurse at all times for urgent questions. Please call the main number to the clinic Dept: [phone] and follow the prompts.   For any non-urgent questions, you may also contact your provider using MyChart. We now offer e-Visits for anyone 71 and older to request care online for non-urgent symptoms. For details visit [email or web address].   Also download the MyChart app! Go to the app store, search "MyChart", open the app, select [facility], and log in with your MyChart username and password.  Masks are optional in the cancer centers. If you would like for your care team to wear a mask while they are taking care of you, please let them know. You may have one support person who is at least 79 years old accompany you for your appointments. Trastuzumab injection for infusion What is this medication? TRASTUZUMAB (tras TOO zoo mab) is a monoclonal antibody. It is used to treat breast cancer and stomach cancer. This medicine may be used for other purposes; ask your health care provider or pharmacist if you have questions. COMMON BRAND NAME(S): Herceptin, Janae Bridgeman, Ontruzant, Trazimera What should I tell my  care team before I take this medication? They need to know if you have any of these conditions: heart disease heart failure lung or breathing disease, like asthma an unusual or allergic reaction to trastuzumab, benzyl alcohol, or other medications, foods, dyes, or preservatives pregnant or trying  to get pregnant breast-feeding How should I use this medication? This drug is given as an infusion into a vein. It is administered in a hospital or clinic by a specially trained health care professional. Talk to your pediatrician regarding the use of this medicine in children. This medicine is not approved for use in children. Overdosage: If you think you have taken too much of this medicine contact a poison control center or emergency room at once. NOTE: This medicine is only for you. Do not share this medicine with others. What if I miss a dose? It is important not to miss a dose. Call your doctor or health care professional if you are unable to keep an appointment. What may interact with this medication? This medicine may interact with the following medications: certain types of chemotherapy, such as daunorubicin, doxorubicin, epirubicin, and idarubicin This list may not describe all possible interactions. Give your health care provider a list of all the medicines, herbs, non-prescription drugs, or dietary supplements you use. Also tell them if you smoke, drink alcohol, or use illegal drugs. Some items may interact with your medicine. What should I watch for while using this medication? Visit your doctor for checks on your progress. Report any side effects. Continue your course of treatment even though you feel ill unless your doctor tells you to stop. Call your doctor or health care professional for advice if you get a fever, chills or sore throat, or other symptoms of a cold or flu. Do not treat yourself. Try to avoid being around people who are sick. You may experience fever, chills and shaking during your first infusion. These effects are usually mild and can be treated with other medicines. Report any side effects during the infusion to your health care professional. Fever and chills usually do not happen with later infusions. Do not become pregnant while taking this medicine or for 7 months  after stopping it. Women should inform their doctor if they wish to become pregnant or think they might be pregnant. Women of child-bearing potential will need to have a negative pregnancy test before starting this medicine. There is a potential for serious side effects to an unborn child. Talk to your health care professional or pharmacist for more information. Do not breast-feed an infant while taking this medicine or for 7 months after stopping it. Women must use effective birth control with this medicine. What side effects may I notice from receiving this medication? Side effects that you should report to your doctor or health care professional as soon as possible: allergic reactions like skin rash, itching or hives, swelling of the face, lips, or tongue chest pain or palpitations cough dizziness feeling faint or lightheaded, falls fever general ill feeling or flu-like symptoms signs of worsening heart failure like breathing problems; swelling in your legs and feet unusually weak or tired Side effects that usually do not require medical attention (report to your doctor or health care professional if they continue or are bothersome): bone pain changes in taste diarrhea joint pain nausea/vomiting weight loss This list may not describe all possible side effects. Call your doctor for medical advice about side effects. You may report side effects to FDA at 1-800-FDA-1088. Where should  I keep my medication? This drug is given in a hospital or clinic and will not be stored at home. NOTE: This sheet is a summary. It may not cover all possible information. If you have questions about this medicine, talk to your doctor, pharmacist, or health care provider.  2023 Elsevier/Gold Standard (2016-04-13 00:00:00) Docetaxel injection What is this medication? DOCETAXEL (doe se TAX el) is a chemotherapy drug. It targets fast dividing cells, like cancer cells, and causes these cells to die. This medicine  is used to treat many types of cancers like breast cancer, certain stomach cancers, head and neck cancer, lung cancer, and prostate cancer. This medicine may be used for other purposes; ask your health care provider or pharmacist if you have questions. COMMON BRAND NAME(S): Docefrez, Taxotere What should I tell my care team before I take this medication? They need to know if you have any of these conditions: infection (especially a virus infection such as chickenpox, cold sores, or herpes) liver disease low blood counts, like low white cell, platelet, or red cell counts an unusual or allergic reaction to docetaxel, polysorbate 80, other chemotherapy agents, other medicines, foods, dyes, or preservatives pregnant or trying to get pregnant breast-feeding How should I use this medication? This drug is given as an infusion into a vein. It is administered in a hospital or clinic by a specially trained health care professional. Talk to your pediatrician regarding the use of this medicine in children. Special care may be needed. Overdosage: If you think you have taken too much of this medicine contact a poison control center or emergency room at once. NOTE: This medicine is only for you. Do not share this medicine with others. What if I miss a dose? It is important not to miss your dose. Call your doctor or health care professional if you are unable to keep an appointment. What may interact with this medication? Do not take this medicine with any of the following medications: live virus vaccines This medicine may also interact with the following medications: aprepitant certain antibiotics like erythromycin or clarithromycin certain antivirals for HIV or hepatitis certain medicines for fungal infections like fluconazole, itraconazole, ketoconazole, posaconazole, or voriconazole cimetidine ciprofloxacin conivaptan cyclosporine dronedarone fluvoxamine grapefruit juice imatinib verapamil This  list may not describe all possible interactions. Give your health care provider a list of all the medicines, herbs, non-prescription drugs, or dietary supplements you use. Also tell them if you smoke, drink alcohol, or use illegal drugs. Some items may interact with your medicine. What should I watch for while using this medication? Your condition will be monitored carefully while you are receiving this medicine. You will need important blood work done while you are taking this medicine. Call your doctor or health care professional for advice if you get a fever, chills or sore throat, or other symptoms of a cold or flu. Do not treat yourself. This drug decreases your body's ability to fight infections. Try to avoid being around people who are sick. Some products may contain alcohol. Ask your health care professional if this medicine contains alcohol. Be sure to tell all health care professionals you are taking this medicine. Certain medicines, like metronidazole and disulfiram, can cause an unpleasant reaction when taken with alcohol. The reaction includes flushing, headache, nausea, vomiting, sweating, and increased thirst. The reaction can last from 30 minutes to several hours. You may get drowsy or dizzy. Do not drive, use machinery, or do anything that needs mental alertness until you know how  this medicine affects you. Do not stand or sit up quickly, especially if you are an older patient. This reduces the risk of dizzy or fainting spells. Alcohol may interfere with the effect of this medicine. Talk to your health care professional about your risk of cancer. You may be more at risk for certain types of cancer if you take this medicine. Do not become pregnant while taking this medicine or for 6 months after stopping it. Women should inform their doctor if they wish to become pregnant or think they might be pregnant. There is a potential for serious side effects to an unborn child. Talk to your health care  professional or pharmacist for more information. Do not breast-feed an infant while taking this medicine or for 1 week after stopping it. Males who get this medicine must use a condom during sex with females who can get pregnant. If you get a woman pregnant, the baby could have birth defects. The baby could die before they are born. You will need to continue wearing a condom for 3 months after stopping the medicine. Tell your health care provider right away if your partner becomes pregnant while you are taking this medicine. This may interfere with the ability to father a child. You should talk to your doctor or health care professional if you are concerned about your fertility. What side effects may I notice from receiving this medication? Side effects that you should report to your doctor or health care professional as soon as possible: allergic reactions like skin rash, itching or hives, swelling of the face, lips, or tongue blurred vision breathing problems changes in vision low blood counts - This drug may decrease the number of white blood cells, red blood cells and platelets. You may be at increased risk for infections and bleeding. nausea and vomiting pain, redness or irritation at site where injected pain, tingling, numbness in the hands or feet redness, blistering, peeling, or loosening of the skin, including inside the mouth signs of decreased platelets or bleeding - bruising, pinpoint red spots on the skin, black, tarry stools, nosebleeds signs of decreased red blood cells - unusually weak or tired, fainting spells, lightheadedness signs of infection - fever or chills, cough, sore throat, pain or difficulty passing urine swelling of the ankle, feet, hands Side effects that usually do not require medical attention (report to your doctor or health care professional if they continue or are bothersome): constipation diarrhea fingernail or toenail changes hair loss loss of  appetite mouth sores muscle pain This list may not describe all possible side effects. Call your doctor for medical advice about side effects. You may report side effects to FDA at 1-800-FDA-1088. Where should I keep my medication? This drug is given in a hospital or clinic and will not be stored at home. NOTE: This sheet is a summary. It may not cover all possible information. If you have questions about this medicine, talk to your doctor, pharmacist, or health care provider.  2023 Elsevier/Gold Standard (2021-02-27 00:00:00) Carboplatin injection What is this medication? CARBOPLATIN (KAR boe pla tin) is a chemotherapy drug. It targets fast dividing cells, like cancer cells, and causes these cells to die. This medicine is used to treat ovarian cancer and many other cancers. This medicine may be used for other purposes; ask your health care provider or pharmacist if you have questions. COMMON BRAND NAME(S): Paraplatin What should I tell my care team before I take this medication? They need to know if you have  any of these conditions: blood disorders hearing problems kidney disease recent or ongoing radiation therapy an unusual or allergic reaction to carboplatin, cisplatin, other chemotherapy, other medicines, foods, dyes, or preservatives pregnant or trying to get pregnant breast-feeding How should I use this medication? This drug is usually given as an infusion into a vein. It is administered in a hospital or clinic by a specially trained health care professional. Talk to your pediatrician regarding the use of this medicine in children. Special care may be needed. Overdosage: If you think you have taken too much of this medicine contact a poison control center or emergency room at once. NOTE: This medicine is only for you. Do not share this medicine with others. What if I miss a dose? It is important not to miss a dose. Call your doctor or health care professional if you are unable to  keep an appointment. What may interact with this medication? medicines for seizures medicines to increase blood counts like filgrastim, pegfilgrastim, sargramostim some antibiotics like amikacin, gentamicin, neomycin, streptomycin, tobramycin vaccines Talk to your doctor or health care professional before taking any of these medicines: acetaminophen aspirin ibuprofen ketoprofen naproxen This list may not describe all possible interactions. Give your health care provider a list of all the medicines, herbs, non-prescription drugs, or dietary supplements you use. Also tell them if you smoke, drink alcohol, or use illegal drugs. Some items may interact with your medicine. What should I watch for while using this medication? Your condition will be monitored carefully while you are receiving this medicine. You will need important blood work done while you are taking this medicine. This drug may make you feel generally unwell. This is not uncommon, as chemotherapy can affect healthy cells as well as cancer cells. Report any side effects. Continue your course of treatment even though you feel ill unless your doctor tells you to stop. In some cases, you may be given additional medicines to help with side effects. Follow all directions for their use. Call your doctor or health care professional for advice if you get a fever, chills or sore throat, or other symptoms of a cold or flu. Do not treat yourself. This drug decreases your body's ability to fight infections. Try to avoid being around people who are sick. This medicine may increase your risk to bruise or bleed. Call your doctor or health care professional if you notice any unusual bleeding. Be careful brushing and flossing your teeth or using a toothpick because you may get an infection or bleed more easily. If you have any dental work done, tell your dentist you are receiving this medicine. Avoid taking products that contain aspirin, acetaminophen,  ibuprofen, naproxen, or ketoprofen unless instructed by your doctor. These medicines may hide a fever. Do not become pregnant while taking this medicine. Women should inform their doctor if they wish to become pregnant or think they might be pregnant. There is a potential for serious side effects to an unborn child. Talk to your health care professional or pharmacist for more information. Do not breast-feed an infant while taking this medicine. What side effects may I notice from receiving this medication? Side effects that you should report to your doctor or health care professional as soon as possible: allergic reactions like skin rash, itching or hives, swelling of the face, lips, or tongue signs of infection - fever or chills, cough, sore throat, pain or difficulty passing urine signs of decreased platelets or bleeding - bruising, pinpoint red spots on the  skin, black, tarry stools, nosebleeds signs of decreased red blood cells - unusually weak or tired, fainting spells, lightheadedness breathing problems changes in hearing changes in vision chest pain high blood pressure low blood counts - This drug may decrease the number of white blood cells, red blood cells and platelets. You may be at increased risk for infections and bleeding. nausea and vomiting pain, swelling, redness or irritation at the injection site pain, tingling, numbness in the hands or feet problems with balance, talking, walking trouble passing urine or change in the amount of urine Side effects that usually do not require medical attention (report to your doctor or health care professional if they continue or are bothersome): hair loss loss of appetite metallic taste in the mouth or changes in taste This list may not describe all possible side effects. Call your doctor for medical advice about side effects. You may report side effects to FDA at 1-800-FDA-1088. Where should I keep my medication? This drug is given in a  hospital or clinic and will not be stored at home. NOTE: This sheet is a summary. It may not cover all possible information. If you have questions about this medicine, talk to your doctor, pharmacist, or health care provider.  2023 Elsevier/Gold Standard (2007-09-06 00:00:00)

## 2021-11-11 ENCOUNTER — Telehealth: Payer: Self-pay | Admitting: *Deleted

## 2021-11-11 NOTE — Telephone Encounter (Signed)
-----   Message from Dionne Ano, RN sent at 11/10/2021  1:35 PM EDT ----- Regarding: Follow up: 1st time Dr Chryl Heck Ogivri/Taxol/Carbo 11/11/2021

## 2021-11-11 NOTE — Telephone Encounter (Signed)
Called pt to check on her after treatment.  She reports doing well without side effects.  She mentioned some constipation which she has at times without treatment.  Discussed miralax, stool softeners, & diet.  She took Mag Citrate which took a while to work but then had some diarrhea.  She knows how reach [place] if needed & knows her next appt.

## 2021-11-12 ENCOUNTER — Other Ambulatory Visit: Payer: Self-pay

## 2021-11-12 ENCOUNTER — Inpatient Hospital Stay: Payer: Medicare Other

## 2021-11-12 VITALS — BP 159/78 | HR 71 | Temp 98.4°F | Resp 18

## 2021-11-12 DIAGNOSIS — Z5111 Encounter for antineoplastic chemotherapy: Secondary | ICD-10-CM | POA: Diagnosis not present

## 2021-11-12 DIAGNOSIS — C50411 Malignant neoplasm of upper-outer quadrant of right female breast: Secondary | ICD-10-CM

## 2021-11-12 DIAGNOSIS — Z170 Estrogen receptor positive status [ER+]: Secondary | ICD-10-CM | POA: Diagnosis not present

## 2021-11-12 DIAGNOSIS — Z5189 Encounter for other specified aftercare: Secondary | ICD-10-CM | POA: Diagnosis not present

## 2021-11-12 DIAGNOSIS — Z5112 Encounter for antineoplastic immunotherapy: Secondary | ICD-10-CM | POA: Diagnosis not present

## 2021-11-12 DIAGNOSIS — R12 Heartburn: Secondary | ICD-10-CM | POA: Diagnosis not present

## 2021-11-12 MED ORDER — PEGFILGRASTIM-CBQV 6 MG/0.6ML ~~LOC~~ SOSY
6.0000 mg | PREFILLED_SYRINGE | Freq: Once | SUBCUTANEOUS | Status: AC
  Administered 2021-11-12: 6 mg via SUBCUTANEOUS
  Filled 2021-11-12: qty 0.6

## 2021-11-12 NOTE — Patient Instructions (Signed)

## 2021-11-13 ENCOUNTER — Telehealth: Payer: Self-pay | Admitting: *Deleted

## 2021-11-13 ENCOUNTER — Encounter: Payer: Self-pay | Admitting: Physical Therapy

## 2021-11-13 NOTE — Telephone Encounter (Signed)
This RN spoke with pt per her call this AM wanting to ask about symptoms post treatment and how to best manage.  She states onset of right focal hip pain with some radiation down right leg after leaving her treatment on 11/10/2021-  Wednesday pain was present " but it was more episodic- and I did continue to move around which helped " Yesterday pain more present - and she used tylenol with some benefit and then later in the day she took 600 mg advil with benefit. She had her pegfilgrastim yesterday and took tylenol and claritin prior to receiving.  Today she states symptoms are greatly improved- she is wondering why above occurred pre pegfilgrastim.  This RN discussed possible sciatica induced by the the position and length of  sitting in the reclining chair while receiving therapy.  This RN recommended she continue advil at 400 mg twice a day today and then use tylenol between so she is managing pain well.  Discussed possible need for pillows for cushioning with further treatment as well as need to get up and walk and stretch.  Pt verbalized understanding and appreciation of discussion. This RN did inform pt to call if symptom worsen or are not controlled.  No further needs at this time.

## 2021-11-17 ENCOUNTER — Encounter: Payer: Self-pay | Admitting: Hematology and Oncology

## 2021-11-17 DIAGNOSIS — M25561 Pain in right knee: Secondary | ICD-10-CM | POA: Diagnosis not present

## 2021-11-17 DIAGNOSIS — S8391XA Sprain of unspecified site of right knee, initial encounter: Secondary | ICD-10-CM | POA: Diagnosis not present

## 2021-11-17 NOTE — Progress Notes (Signed)
Called pt to introduce myself as her Financial Resource Specialist and to discuss the [employer] grant.  Pt has 2 insurances so copay assistance shouldn't be needed.  I left a msg requesting she return my call if she's interested in applying for the grant.  

## 2021-11-18 ENCOUNTER — Encounter: Payer: Self-pay | Admitting: Hematology and Oncology

## 2021-11-18 ENCOUNTER — Other Ambulatory Visit: Payer: Self-pay

## 2021-11-18 ENCOUNTER — Inpatient Hospital Stay: Payer: Medicare Other

## 2021-11-18 ENCOUNTER — Encounter: Payer: Self-pay | Admitting: Physician Assistant

## 2021-11-18 ENCOUNTER — Inpatient Hospital Stay (HOSPITAL_BASED_OUTPATIENT_CLINIC_OR_DEPARTMENT_OTHER): Payer: Medicare Other | Admitting: Physician Assistant

## 2021-11-18 VITALS — BP 136/74 | HR 93 | Temp 97.7°F | Resp 16 | Ht 62.0 in | Wt 138.4 lb

## 2021-11-18 DIAGNOSIS — Z170 Estrogen receptor positive status [ER+]: Secondary | ICD-10-CM

## 2021-11-18 DIAGNOSIS — Z5111 Encounter for antineoplastic chemotherapy: Secondary | ICD-10-CM | POA: Diagnosis not present

## 2021-11-18 DIAGNOSIS — Z5189 Encounter for other specified aftercare: Secondary | ICD-10-CM | POA: Diagnosis not present

## 2021-11-18 DIAGNOSIS — Z5112 Encounter for antineoplastic immunotherapy: Secondary | ICD-10-CM | POA: Diagnosis not present

## 2021-11-18 DIAGNOSIS — Z95828 Presence of other vascular implants and grafts: Secondary | ICD-10-CM | POA: Insufficient documentation

## 2021-11-18 DIAGNOSIS — C50411 Malignant neoplasm of upper-outer quadrant of right female breast: Secondary | ICD-10-CM

## 2021-11-18 DIAGNOSIS — R12 Heartburn: Secondary | ICD-10-CM | POA: Diagnosis not present

## 2021-11-18 HISTORY — DX: Presence of other vascular implants and grafts: Z95.828

## 2021-11-18 LAB — CBC WITH DIFFERENTIAL (CANCER CENTER ONLY)
Abs Immature Granulocytes: 3.13 10*3/uL — ABNORMAL HIGH (ref 0.00–0.07)
Basophils Absolute: 0 10*3/uL (ref 0.0–0.1)
Basophils Relative: 0 %
Eosinophils Absolute: 0.1 10*3/uL (ref 0.0–0.5)
Eosinophils Relative: 0 %
HCT: 33.4 % — ABNORMAL LOW (ref 36.0–46.0)
Hemoglobin: 11.4 g/dL — ABNORMAL LOW (ref 12.0–15.0)
Immature Granulocytes: 9 %
Lymphocytes Relative: 9 %
Lymphs Abs: 3.1 10*3/uL (ref 0.7–4.0)
MCH: 26.6 pg (ref 26.0–34.0)
MCHC: 34.1 g/dL (ref 30.0–36.0)
MCV: 78 fL — ABNORMAL LOW (ref 80.0–100.0)
Monocytes Absolute: 3.2 10*3/uL — ABNORMAL HIGH (ref 0.1–1.0)
Monocytes Relative: 10 %
Neutro Abs: 23.8 10*3/uL — ABNORMAL HIGH (ref 1.7–7.7)
Neutrophils Relative %: 72 %
Platelet Count: 162 10*3/uL (ref 150–400)
RBC: 4.28 MIL/uL (ref 3.87–5.11)
RDW: 14.4 % (ref 11.5–15.5)
WBC Count: 33.3 10*3/uL — ABNORMAL HIGH (ref 4.0–10.5)
nRBC: 0 % (ref 0.0–0.2)

## 2021-11-18 LAB — CMP (CANCER CENTER ONLY)
ALT: 28 U/L (ref 0–44)
AST: 20 U/L (ref 15–41)
Albumin: 3.9 g/dL (ref 3.5–5.0)
Alkaline Phosphatase: 135 U/L — ABNORMAL HIGH (ref 38–126)
Anion gap: 7 (ref 5–15)
BUN: 21 mg/dL (ref 8–23)
CO2: 29 mmol/L (ref 22–32)
Calcium: 9 mg/dL (ref 8.9–10.3)
Chloride: 97 mmol/L — ABNORMAL LOW (ref 98–111)
Creatinine: 0.98 mg/dL (ref 0.44–1.00)
GFR, Estimated: 59 mL/min — ABNORMAL LOW (ref >60)
Glucose, Bld: 109 mg/dL — ABNORMAL HIGH (ref 70–99)
Potassium: 4.1 mmol/L (ref 3.5–5.1)
Sodium: 133 mmol/L — ABNORMAL LOW (ref 135–145)
Total Bilirubin: 0.3 mg/dL (ref 0.3–1.2)
Total Protein: 6.5 g/dL (ref 6.5–8.1)

## 2021-11-18 MED ORDER — SODIUM CHLORIDE 0.9% FLUSH
10.0000 mL | Freq: Once | INTRAVENOUS | Status: AC
  Administered 2021-11-18: 10 mL

## 2021-11-18 MED ORDER — HEPARIN SOD (PORK) LOCK FLUSH 100 UNIT/ML IV SOLN
500.0000 [IU] | Freq: Once | INTRAVENOUS | Status: AC
  Administered 2021-11-18: 500 [IU]

## 2021-11-18 NOTE — Progress Notes (Signed)
[facility] PROGRESS NOTE  Patient Care Team: Kelton Pillar, MD as PCP - General (Family Medicine) [name], MD as Consulting Physician (General Surgery) Benay Pike, MD as Consulting Physician (Hematology and Oncology) Kyung Rudd, MD as Consulting Physician (Radiation Oncology) Mauro Kaufmann, RN as Oncology Nurse Navigator Rockwell Germany, RN as Oncology Nurse Navigator  CHIEF COMPLAINTS/PURPOSE OF CONSULTATION:  Breast cancer  SUMMARY OF ONCOLOGIC HISTORY: Oncology History  Malignant neoplasm of upper-outer quadrant of right breast in female, estrogen receptor positive ([place])  08/12/2021 Mammogram   Mammogram showed possible mass in the right breast.  Diagnostic mammogram showed 2 highly suspicious upper outer right breast masses measuring 1.5 cm in the 9:30 position and 1.4 cm at the 10 o'clock position.  These masses or distortions mammographically spanning a distance of up to 3.5 cm.  No abnormal appearing right axillary lymph nodes.   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  Prognostic showed ER 99% positive strong staining PR 10% positive moderate staining, HER2 positive 3+ and Ki-67 of 30%.   09/08/2021 Initial Diagnosis   Malignant neoplasm of upper-outer quadrant of right breast in female, estrogen receptor positive ([place])   09/15/2021 Pathology Results   She had right breast lumpectomy which showed invasive ductal carcinoma, 3.6 cm in maximal extent involving inferior and medial margins, DCIS approaching to less than 0.1 cm of closest margin no metastatic carcinoma identified in 4 out of 4 lymph nodes.  She is scheduled for repeat surgery given positive margins.  Prior prognostic showed ER 99% positive strong staining intensity PR 10% positive moderate staining intensity, HER2 positive.   09/23/2021 Genetic Testing   Negative hereditary cancer genetic testing: no pathogenic variants  detected in [facility] CustomNext-cancer +RNAinsight Panel.  Report date is September 23, 2021.   The CustomNext-Cancer+RNAinsight panel offered by [facility] includes sequencing and rearrangement analysis for the following 47 genes:  APC, ATM, AXIN2, BARD1, BMPR1A, BRCA1, BRCA2, BRIP1, CDH1, CDK4, CDKN2A, CHEK2, DICER1, EPCAM, GREM1, HOXB13, MEN1, MLH1, MSH2, MSH3, MSH6, MUTYH, NBN, NF1, NF2, NTHL1, PALB2, PMS2, POLD1, POLE, PTEN, RAD51C, RAD51D, RECQL, RET, SDHA, SDHAF2, SDHB, SDHC, SDHD, SMAD4, SMARCA4, STK11, TP53, TSC1, TSC2, and VHL.  RNA data is routinely analyzed for use in variant interpretation for all genes.   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 DCIS 0.25 mg from true/new margin.   11/10/2021 -  Chemotherapy   Patient is on Treatment Plan : BREAST Docetaxel + Carboplatin + Trastuzumab ([place]) q21d / Trastuzumab q21d      INTERVAL HISTORY: Rhonda Richardson returns for a follow up visit after receiving Cycle 1 of carboplatin/docetaxel and trastuzumab on 11/10/2021. She is unaccompanied for this visit.  Ms. Stcharles reports her energy levels were fairly stable after chemotherapy. She is able to complete all her daily activities on her own. Following chemotherapy, she experienced right hip pain that radiated down her right leg lasting a few days. She was instructed to take advil and tylenol which did improve the pain. She suspects that sitting for long periods caused the pain. She does have some constipation and has managed with miralax and dulcolax. Her last bowel movement was earlier today. She denies any nausea or vomiting but does have a burning sensation in the back of the throat. She did take pepcid for a  couple of days. She has a great appetite but is trying to eat healthier with small, frequent meals. She has lost 6 lbs since 11/02/2021. She adds that  Friday, she had a small fall after her right knee buckled and she landed on her right knee. She was seen by ortho yesterday and ruled out fracture and knee pain is improving. She plans to wear a knee brace for added support. She denies fevers, chills, night sweats, shortness of breath, chest pain or cough. She has no other complaints.   Rest of the pertinent 10 point ROS reviewed and negative.  MEDICAL HISTORY:  Past Medical History:  Diagnosis Date   Breast cancer ([place])    Hypertension     SURGICAL HISTORY: Past Surgical History:  Procedure Laterality Date   BREAST LUMPECTOMY WITH RADIOACTIVE SEED AND SENTINEL LYMPH NODE BIOPSY Right 09/15/2021   Procedure: RIGHT BREAST SEED BRACKETED LUMPECTOMY AND SENTINEL NODE BIOPSY;  Surgeon: [name], MD;  Location: [facility];  Service: General;  Laterality: Right;   cataract surgery Bilateral    DILATION AND CURETTAGE OF UTERUS  1970   EXCISION OF BREAST LESION Right 10/15/2021   Procedure: ASPIRATION RIGHT AXILLARY SEROMA;  Surgeon: [name], MD;  Location: [facility];  Service: General;  Laterality: Right;   eyelid tendon repair Bilateral    PORTACATH PLACEMENT N/A 09/15/2021   Procedure: PORT PLACEMENT;  Surgeon: [name], MD;  Location: [facility];  Service: General;  Laterality: N/A;   RE-EXCISION OF BREAST LUMPECTOMY Right 10/15/2021   Procedure: RE-EXCISION LUMPECTOMY RIGHT BREAST;  Surgeon: [name], MD;  Location: [facility];  Service: General;  Laterality: Right;    SOCIAL HISTORY: Social History   Socioeconomic History   Marital status: Married    Spouse name: Not on file   Number of children: Not on file   Years of education: Not on file   Highest education level: Not on file  Occupational History   Not on file  Tobacco Use   Smoking status: Never   Smokeless tobacco: Not on file  Vaping Use   Vaping Use: Never used  Substance and Sexual Activity   Alcohol use: Yes    Comment: 3- 4  drinks   Drug use: Never   Sexual activity: Not Currently  Other Topics Concern   Not on file  Social History Narrative   Not on file   Social Determinants of Health   Financial Resource Strain: Low Risk  (09/09/2021)   Overall Financial Resource Strain (CARDIA)    Difficulty of Paying Living Expenses: Not hard at all  Food Insecurity: No Food Insecurity (09/09/2021)   Hunger Vital Sign    Worried About Running Out of Food in the Last Year: Never true    [place] in the Last Year: Never true  Transportation Needs: No Transportation Needs (09/09/2021)   PRAPARE - Hydrologist (Medical): No    Lack of Transportation (Non-Medical): No  Physical Activity: Not on file  Stress: Not on file  Social Connections: Not on file  Intimate Partner Violence: Not on file    FAMILY HISTORY: Family History  Problem Relation Age of Onset   Heart disease Father 39   Thyroid cancer Other 59       niece's daughter    ALLERGIES:  is allergic to macrodantin [nitrofurantoin].  MEDICATIONS:  Current Outpatient Medications  Medication Sig Dispense Refill   acetaminophen (TYLENOL) 325 MG  tablet Take 325 mg by mouth every 6 (six) hours as needed for moderate pain.     benazepril (LOTENSIN) 20 MG tablet Take 20 mg by mouth daily.     cholecalciferol (VITAMIN D3) 25 MCG (1000 UNIT) tablet Take 1,000 Units by mouth daily.     dexamethasone (DECADRON) 4 MG tablet Take 2 tablets (8 mg total) by mouth 2 (two) times daily. Take decadron  Twice daily the day before taxotere then daily x 3 days after carboplatin  chemo 30 tablet 1   famotidine (PEPCID) 20 MG tablet Take 20 mg by mouth daily as needed for heartburn or indigestion.     hydrochlorothiazide (MICROZIDE) 12.5 MG capsule Take 12.5 mg by mouth daily.     ibuprofen (ADVIL) 200 MG tablet Take 200 mg by mouth every 6 (six) hours as needed for moderate pain.     lidocaine-prilocaine (EMLA) cream Apply to affected area  once 30 g 3   Magnesium 250 MG TABS Take 250 mg by mouth daily.     ondansetron (ZOFRAN) 8 MG tablet Take 1 tablet (8 mg total) by mouth 2 (two) times daily as needed for refractory nausea / vomiting. Start on day 3 after chemo. 30 tablet 1   prochlorperazine (COMPAZINE) 10 MG tablet Take 1 tablet (10 mg total) by mouth every 6 (six) hours as needed (Nausea or vomiting). 30 tablet 1   Red Yeast Rice 600 MG CAPS Take 600 mg by mouth daily.     traMADol (ULTRAM) 50 MG tablet Take by mouth every 6 (six) hours as needed.     Vitamins-Lipotropics (MULTI-VITAMIN HP/MINERALS PO) Take 1 tablet by mouth daily.     HYDROcodone-acetaminophen (NORCO/VICODIN) 5-325 MG tablet Take 1 tablet by mouth every 6 (six) hours as needed for moderate pain. (Patient not taking: Reported on 10/01/2021) 5 tablet 0   No current facility-administered medications for this visit.    REVIEW OF SYSTEMS:   Constitutional: Denies fevers, chills or abnormal night sweats Eyes: Denies blurriness of vision, double vision or watery eyes Ears, nose, mouth, throat, and face: Denies mucositis or sore throat Respiratory: Denies cough, dyspnea or wheezes Cardiovascular: Denies palpitation, chest discomfort or lower extremity swelling Gastrointestinal:  Denies nausea. +Heartburn, constipation Skin: Denies abnormal skin rashes Lymphatics: Denies new lymphadenopathy or easy bruising Neurological:Denies numbness, tingling or new weaknesses Behavioral/Psych: Mood is stable, no new changes  Breast: Denies any palpable lumps or discharge All other systems were reviewed with the patient and are negative.  PHYSICAL EXAMINATION: ECOG PERFORMANCE STATUS: 0 - Asymptomatic  Vitals:   11/18/21 0836  BP: 136/74  Pulse: 93  Resp: 16  Temp: 97.7 F (36.5 C)  SpO2: 98%    Filed Weights   11/18/21 0836  Weight: 138 lb 6.4 oz (62.8 kg)   Constitutional: Oriented to person, place, and time and well-developed, well-nourished, and in no  distress.  HENT:  Head: Normocephalic and atraumatic.  Eyes: Conjunctivae are normal. Right eye exhibits no discharge. Left eye exhibits no discharge. No scleral icterus.  Neck: Normal range of motion. Neck supple.   Cardiovascular: Normal rate, regular rhythm, normal heart sounds and intact distal pulses.   Pulmonary/Chest: Effort normal and breath sounds normal. No respiratory distress. No wheezes. No rales.  Abdominal: Soft. Bowel sounds are normal. Exhibits no distension and no mass. There is no tenderness.  Musculoskeletal: Normal range of motion. Exhibits no edema.  Lymphadenopathy: No cervical adenopathy.  Neurological: Alert and oriented to person, place, and time. Exhibits  normal muscle tone. Gait normal. Coordination normal.  Skin: Skin is warm and dry. No rash noted. Not diaphoretic. No erythema. No pallor.  Psychiatric: Mood, memory and judgment normal.    LABORATORY DATA:  I have reviewed the data as listed Lab Results  Component Value Date   WBC 33.3 (H) 11/18/2021   HGB 11.4 (L) 11/18/2021   HCT 33.4 (L) 11/18/2021   MCV 78.0 (L) 11/18/2021   PLT 162 11/18/2021   Lab Results  Component Value Date   NA 138 11/10/2021   K 3.8 11/10/2021   CL 105 11/10/2021   CO2 24 11/10/2021    RADIOGRAPHIC STUDIES: I have personally reviewed the radiological reports and agreed with the findings in the report.  ASSESSMENT AND PLAN:  Rhonda Richardson is a 81 y.o. female who presents for a follow up for right breast cancer.   #Malignant neoplasm of upper-outer quadrant of right breast in female, estrogen receptor positive  --ER/PR positive, HER2 amplified.  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 Media margin showed focal high-grade ductal carcinoma in situ, solid type with necrosis, negative for invasive carcinoma.  Inferior and posterior margin negative for carcinoma.  --Given large tumor, triple positive, recommend dose modified TCH for 6 cycles followed by adjuvant herceptin vs taxol with herceptin. --Baseline echo from 09/14/2021 was satisfactory with EF 55-60%.  --Patient started Cycle 1, Day 1 of [place] on 11/10/2021. GCSF injection given on Day 3 of each cycle.  --Labs today were reviewed and require no intervention.  --Patient does not have any prohibitive toxicities and require no dose modifications.  --RTC on 12/01/2021 with labs, f/u visit with Dr. Chryl Heck before Cycle 2, Day 1 of [place].   #Acid Reflux:  --Recommend to continue to take pepcid once a day  #Right hip/leg pain: --Suspect pain was secondary to sitting for long periods in the chemo infusion chair --Encouraged to get up and stretch every hour during treatment. Sit on extra pillows for added cushion --Continue to take advil/tylenol as needed  #Constipation: --Continue to take miralax and/or dulcolax.   #Weight loss: --Patient has a good appetite so likely secondary to healthier dietary changes.  --Encouraged to eat adequate protein and supplement with protein shakes.   No problem-specific Assessment & Plan notes found for this encounter.  All questions were answered. The patient knows to call the clinic with any problems, questions or concerns.   I have spent a total of 30 minutes minutes of face-to-face and non-face-to-face time, preparing to see the patient, performing a medically appropriate examination, counseling and educating the patient, documenting clinical information in the electronic health record, and care coordination.   Dede Query PA-C Dept of Hematology and [place] at [facility] Phone: [phone]

## 2021-11-18 NOTE — Progress Notes (Signed)
Pt returned my call regarding the [employer].  She stated their income may exceed the income requirement so she's declining the grant at this time.

## 2021-11-19 ENCOUNTER — Encounter: Payer: Self-pay | Admitting: *Deleted

## 2021-11-27 MED FILL — Fosaprepitant Dimeglumine For IV Infusion 150 MG (Base Eq): INTRAVENOUS | Qty: 5 | Status: AC

## 2021-11-27 MED FILL — Dexamethasone Sodium Phosphate Inj 100 MG/10ML: INTRAMUSCULAR | Qty: 1 | Status: AC

## 2021-11-30 ENCOUNTER — Inpatient Hospital Stay (HOSPITAL_BASED_OUTPATIENT_CLINIC_OR_DEPARTMENT_OTHER): Payer: Medicare Other | Admitting: Hematology and Oncology

## 2021-11-30 ENCOUNTER — Inpatient Hospital Stay: Payer: Medicare Other

## 2021-11-30 ENCOUNTER — Encounter: Payer: Self-pay | Admitting: Hematology and Oncology

## 2021-11-30 VITALS — BP 163/77 | HR 101 | Temp 97.6°F | Resp 18 | Ht 62.0 in | Wt 138.7 lb

## 2021-11-30 VITALS — BP 142/80 | HR 80 | Temp 97.9°F | Resp 18

## 2021-11-30 DIAGNOSIS — Z5111 Encounter for antineoplastic chemotherapy: Secondary | ICD-10-CM | POA: Diagnosis not present

## 2021-11-30 DIAGNOSIS — R12 Heartburn: Secondary | ICD-10-CM | POA: Diagnosis not present

## 2021-11-30 DIAGNOSIS — Z170 Estrogen receptor positive status [ER+]: Secondary | ICD-10-CM | POA: Diagnosis not present

## 2021-11-30 DIAGNOSIS — Z95828 Presence of other vascular implants and grafts: Secondary | ICD-10-CM

## 2021-11-30 DIAGNOSIS — C50411 Malignant neoplasm of upper-outer quadrant of right female breast: Secondary | ICD-10-CM | POA: Diagnosis not present

## 2021-11-30 DIAGNOSIS — Z5112 Encounter for antineoplastic immunotherapy: Secondary | ICD-10-CM | POA: Diagnosis not present

## 2021-11-30 DIAGNOSIS — Z5189 Encounter for other specified aftercare: Secondary | ICD-10-CM | POA: Diagnosis not present

## 2021-11-30 LAB — CBC WITH DIFFERENTIAL (CANCER CENTER ONLY)
Abs Immature Granulocytes: 0.16 10*3/uL — ABNORMAL HIGH (ref 0.00–0.07)
Basophils Absolute: 0 10*3/uL (ref 0.0–0.1)
Basophils Relative: 0 %
Eosinophils Absolute: 0 10*3/uL (ref 0.0–0.5)
Eosinophils Relative: 0 %
HCT: 30.2 % — ABNORMAL LOW (ref 36.0–46.0)
Hemoglobin: 10.3 g/dL — ABNORMAL LOW (ref 12.0–15.0)
Immature Granulocytes: 1 %
Lymphocytes Relative: 8 %
Lymphs Abs: 0.9 10*3/uL (ref 0.7–4.0)
MCH: 26.6 pg (ref 26.0–34.0)
MCHC: 34.1 g/dL (ref 30.0–36.0)
MCV: 78 fL — ABNORMAL LOW (ref 80.0–100.0)
Monocytes Absolute: 0.3 10*3/uL (ref 0.1–1.0)
Monocytes Relative: 3 %
Neutro Abs: 10.1 10*3/uL — ABNORMAL HIGH (ref 1.7–7.7)
Neutrophils Relative %: 88 %
Platelet Count: 395 10*3/uL (ref 150–400)
RBC: 3.87 MIL/uL (ref 3.87–5.11)
RDW: 14.8 % (ref 11.5–15.5)
WBC Count: 11.5 10*3/uL — ABNORMAL HIGH (ref 4.0–10.5)
nRBC: 0 % (ref 0.0–0.2)

## 2021-11-30 LAB — CMP (CANCER CENTER ONLY)
ALT: 41 U/L (ref 0–44)
AST: 21 U/L (ref 15–41)
Albumin: 4 g/dL (ref 3.5–5.0)
Alkaline Phosphatase: 73 U/L (ref 38–126)
Anion gap: 9 (ref 5–15)
BUN: 26 mg/dL — ABNORMAL HIGH (ref 8–23)
CO2: 23 mmol/L (ref 22–32)
Calcium: 9.2 mg/dL (ref 8.9–10.3)
Chloride: 100 mmol/L (ref 98–111)
Creatinine: 0.71 mg/dL (ref 0.44–1.00)
GFR, Estimated: >60 mL/min (ref >60)
Glucose, Bld: 135 mg/dL — ABNORMAL HIGH (ref 70–99)
Potassium: 3.6 mmol/L (ref 3.5–5.1)
Sodium: 132 mmol/L — ABNORMAL LOW (ref 135–145)
Total Bilirubin: 0.4 mg/dL (ref 0.3–1.2)
Total Protein: 7 g/dL (ref 6.5–8.1)

## 2021-11-30 MED ORDER — SODIUM CHLORIDE 0.9 % IV SOLN
10.0000 mg | Freq: Once | INTRAVENOUS | Status: AC
  Administered 2021-11-30: 10 mg via INTRAVENOUS
  Filled 2021-11-30: qty 10

## 2021-11-30 MED ORDER — TRASTUZUMAB-DKST CHEMO 150 MG IV SOLR
6.0000 mg/kg | Freq: Once | INTRAVENOUS | Status: AC
  Administered 2021-11-30: 399 mg via INTRAVENOUS
  Filled 2021-11-30: qty 19

## 2021-11-30 MED ORDER — SODIUM CHLORIDE 0.9 % IV SOLN
150.0000 mg | Freq: Once | INTRAVENOUS | Status: AC
  Administered 2021-11-30: 150 mg via INTRAVENOUS
  Filled 2021-11-30: qty 150

## 2021-11-30 MED ORDER — SODIUM CHLORIDE 0.9% FLUSH
10.0000 mL | INTRAVENOUS | Status: DC | PRN
  Administered 2021-11-30: 10 mL

## 2021-11-30 MED ORDER — SODIUM CHLORIDE 0.9 % IV SOLN
50.0000 mg/m2 | Freq: Once | INTRAVENOUS | Status: AC
  Administered 2021-11-30: 90 mg via INTRAVENOUS
  Filled 2021-11-30: qty 9

## 2021-11-30 MED ORDER — SODIUM CHLORIDE 0.9% FLUSH
10.0000 mL | Freq: Once | INTRAVENOUS | Status: AC
  Administered 2021-11-30: 10 mL

## 2021-11-30 MED ORDER — PALONOSETRON HCL INJECTION 0.25 MG/5ML
0.2500 mg | Freq: Once | INTRAVENOUS | Status: AC
  Administered 2021-11-30: 0.25 mg via INTRAVENOUS
  Filled 2021-11-30: qty 5

## 2021-11-30 MED ORDER — ACETAMINOPHEN 325 MG PO TABS
650.0000 mg | ORAL_TABLET | Freq: Once | ORAL | Status: AC
  Administered 2021-11-30: 650 mg via ORAL
  Filled 2021-11-30: qty 2

## 2021-11-30 MED ORDER — DIPHENHYDRAMINE HCL 25 MG PO CAPS
50.0000 mg | ORAL_CAPSULE | Freq: Once | ORAL | Status: AC
  Administered 2021-11-30: 50 mg via ORAL
  Filled 2021-11-30: qty 2

## 2021-11-30 MED ORDER — HEPARIN SOD (PORK) LOCK FLUSH 100 UNIT/ML IV SOLN
500.0000 [IU] | Freq: Once | INTRAVENOUS | Status: AC | PRN
  Administered 2021-11-30: 500 [IU]

## 2021-11-30 MED ORDER — SODIUM CHLORIDE 0.9 % IV SOLN: Freq: Once | INTRAVENOUS | Status: AC

## 2021-11-30 MED ORDER — SODIUM CHLORIDE 0.9 % IV SOLN
291.6000 mg | Freq: Once | INTRAVENOUS | Status: AC
  Administered 2021-11-30: 290 mg via INTRAVENOUS
  Filled 2021-11-30: qty 29

## 2021-11-30 NOTE — Patient Instructions (Signed)
[facility]  Discharge Instructions: Thank you for choosing [facility] to provide your oncology and hematology care.   If you have a lab appointment with the [facility], please go directly to the [facility] and check in at the registration area.   Wear comfortable clothing and clothing appropriate for easy access to any Portacath or PICC line.   We strive to give you quality time with your provider. You may need to reschedule your appointment if you arrive late (15 or more minutes).  Arriving late affects you and other patients whose appointments are after yours.  Also, if you miss three or more appointments without notifying the office, you may be dismissed from the clinic at the provider's discretion.      For prescription refill requests, have your pharmacy contact our office and allow 72 hours for refills to be completed.    Today you received the following chemotherapy and/or immunotherapy agents: Herceptin, Docetaxel, Carboplatin.      To help prevent nausea and vomiting after your treatment, we encourage you to take your nausea medication as directed.  BELOW ARE SYMPTOMS THAT SHOULD BE REPORTED IMMEDIATELY: *FEVER GREATER THAN 100.4 F (38 C) OR HIGHER *CHILLS OR SWEATING *NAUSEA AND VOMITING THAT IS NOT CONTROLLED WITH YOUR NAUSEA MEDICATION *UNUSUAL SHORTNESS OF BREATH *UNUSUAL BRUISING OR BLEEDING *URINARY PROBLEMS (pain or burning when urinating, or frequent urination) *BOWEL PROBLEMS (unusual diarrhea, constipation, pain near the anus) TENDERNESS IN MOUTH AND THROAT WITH OR WITHOUT PRESENCE OF ULCERS (sore throat, sores in mouth, or a toothache) UNUSUAL RASH, SWELLING OR PAIN  UNUSUAL VAGINAL DISCHARGE OR ITCHING   Items with * indicate a potential emergency and should be followed up as soon as possible or go to the Emergency Department if any problems should occur.  Please show the CHEMOTHERAPY ALERT CARD or IMMUNOTHERAPY  ALERT CARD at check-in to the Emergency Department and triage nurse.  Should you have questions after your visit or need to cancel or reschedule your appointment, please contact [facility]  Dept: [phone]  and follow the prompts.  Office hours are 8:00 a.m. to 4:30 p.m. Monday - Friday. Please note that voicemails left after 4:00 p.m. may not be returned until the following business day.  We are closed weekends and major holidays. You have access to a nurse at all times for urgent questions. Please call the main number to the clinic Dept: [phone] and follow the prompts.   For any non-urgent questions, you may also contact your provider using MyChart. We now offer e-Visits for anyone 65 and older to request care online for non-urgent symptoms. For details visit [email or web address].   Also download the MyChart app! Go to the app store, search "MyChart", open the app, select [facility], and log in with your MyChart username and password.  Masks are optional in the cancer centers. If you would like for your care team to wear a mask while they are taking care of you, please let them know. You may have one support person who is at least 79 years old accompany you for your appointments. Trastuzumab injection for infusion What is this medication? TRASTUZUMAB (tras TOO zoo mab) is a monoclonal antibody. It is used to treat breast cancer and stomach cancer. This medicine may be used for other purposes; ask your health care provider or pharmacist if you have questions. COMMON BRAND NAME(S): Herceptin, Janae Bridgeman, Ontruzant, Trazimera What should I tell my  care team before I take this medication? They need to know if you have any of these conditions: heart disease heart failure lung or breathing disease, like asthma an unusual or allergic reaction to trastuzumab, benzyl alcohol, or other medications, foods, dyes, or preservatives pregnant or trying  to get pregnant breast-feeding How should I use this medication? This drug is given as an infusion into a vein. It is administered in a hospital or clinic by a specially trained health care professional. Talk to your pediatrician regarding the use of this medicine in children. This medicine is not approved for use in children. Overdosage: If you think you have taken too much of this medicine contact a poison control center or emergency room at once. NOTE: This medicine is only for you. Do not share this medicine with others. What if I miss a dose? It is important not to miss a dose. Call your doctor or health care professional if you are unable to keep an appointment. What may interact with this medication? This medicine may interact with the following medications: certain types of chemotherapy, such as daunorubicin, doxorubicin, epirubicin, and idarubicin This list may not describe all possible interactions. Give your health care provider a list of all the medicines, herbs, non-prescription drugs, or dietary supplements you use. Also tell them if you smoke, drink alcohol, or use illegal drugs. Some items may interact with your medicine. What should I watch for while using this medication? Visit your doctor for checks on your progress. Report any side effects. Continue your course of treatment even though you feel ill unless your doctor tells you to stop. Call your doctor or health care professional for advice if you get a fever, chills or sore throat, or other symptoms of a cold or flu. Do not treat yourself. Try to avoid being around people who are sick. You may experience fever, chills and shaking during your first infusion. These effects are usually mild and can be treated with other medicines. Report any side effects during the infusion to your health care professional. Fever and chills usually do not happen with later infusions. Do not become pregnant while taking this medicine or for 7 months  after stopping it. Women should inform their doctor if they wish to become pregnant or think they might be pregnant. Women of child-bearing potential will need to have a negative pregnancy test before starting this medicine. There is a potential for serious side effects to an unborn child. Talk to your health care professional or pharmacist for more information. Do not breast-feed an infant while taking this medicine or for 7 months after stopping it. Women must use effective birth control with this medicine. What side effects may I notice from receiving this medication? Side effects that you should report to your doctor or health care professional as soon as possible: allergic reactions like skin rash, itching or hives, swelling of the face, lips, or tongue chest pain or palpitations cough dizziness feeling faint or lightheaded, falls fever general ill feeling or flu-like symptoms signs of worsening heart failure like breathing problems; swelling in your legs and feet unusually weak or tired Side effects that usually do not require medical attention (report to your doctor or health care professional if they continue or are bothersome): bone pain changes in taste diarrhea joint pain nausea/vomiting weight loss This list may not describe all possible side effects. Call your doctor for medical advice about side effects. You may report side effects to FDA at 1-800-FDA-1088. Where should  I keep my medication? This drug is given in a hospital or clinic and will not be stored at home. NOTE: This sheet is a summary. It may not cover all possible information. If you have questions about this medicine, talk to your doctor, pharmacist, or health care provider.  2023 Elsevier/Gold Standard (2016-04-13 00:00:00) Docetaxel injection What is this medication? DOCETAXEL (doe se TAX el) is a chemotherapy drug. It targets fast dividing cells, like cancer cells, and causes these cells to die. This medicine  is used to treat many types of cancers like breast cancer, certain stomach cancers, head and neck cancer, lung cancer, and prostate cancer. This medicine may be used for other purposes; ask your health care provider or pharmacist if you have questions. COMMON BRAND NAME(S): Docefrez, Taxotere What should I tell my care team before I take this medication? They need to know if you have any of these conditions: infection (especially a virus infection such as chickenpox, cold sores, or herpes) liver disease low blood counts, like low white cell, platelet, or red cell counts an unusual or allergic reaction to docetaxel, polysorbate 80, other chemotherapy agents, other medicines, foods, dyes, or preservatives pregnant or trying to get pregnant breast-feeding How should I use this medication? This drug is given as an infusion into a vein. It is administered in a hospital or clinic by a specially trained health care professional. Talk to your pediatrician regarding the use of this medicine in children. Special care may be needed. Overdosage: If you think you have taken too much of this medicine contact a poison control center or emergency room at once. NOTE: This medicine is only for you. Do not share this medicine with others. What if I miss a dose? It is important not to miss your dose. Call your doctor or health care professional if you are unable to keep an appointment. What may interact with this medication? Do not take this medicine with any of the following medications: live virus vaccines This medicine may also interact with the following medications: aprepitant certain antibiotics like erythromycin or clarithromycin certain antivirals for HIV or hepatitis certain medicines for fungal infections like fluconazole, itraconazole, ketoconazole, posaconazole, or voriconazole cimetidine ciprofloxacin conivaptan cyclosporine dronedarone fluvoxamine grapefruit juice imatinib verapamil This  list may not describe all possible interactions. Give your health care provider a list of all the medicines, herbs, non-prescription drugs, or dietary supplements you use. Also tell them if you smoke, drink alcohol, or use illegal drugs. Some items may interact with your medicine. What should I watch for while using this medication? Your condition will be monitored carefully while you are receiving this medicine. You will need important blood work done while you are taking this medicine. Call your doctor or health care professional for advice if you get a fever, chills or sore throat, or other symptoms of a cold or flu. Do not treat yourself. This drug decreases your body's ability to fight infections. Try to avoid being around people who are sick. Some products may contain alcohol. Ask your health care professional if this medicine contains alcohol. Be sure to tell all health care professionals you are taking this medicine. Certain medicines, like metronidazole and disulfiram, can cause an unpleasant reaction when taken with alcohol. The reaction includes flushing, headache, nausea, vomiting, sweating, and increased thirst. The reaction can last from 30 minutes to several hours. You may get drowsy or dizzy. Do not drive, use machinery, or do anything that needs mental alertness until you know how  this medicine affects you. Do not stand or sit up quickly, especially if you are an older patient. This reduces the risk of dizzy or fainting spells. Alcohol may interfere with the effect of this medicine. Talk to your health care professional about your risk of cancer. You may be more at risk for certain types of cancer if you take this medicine. Do not become pregnant while taking this medicine or for 6 months after stopping it. Women should inform their doctor if they wish to become pregnant or think they might be pregnant. There is a potential for serious side effects to an unborn child. Talk to your health care  professional or pharmacist for more information. Do not breast-feed an infant while taking this medicine or for 1 week after stopping it. Males who get this medicine must use a condom during sex with females who can get pregnant. If you get a woman pregnant, the baby could have birth defects. The baby could die before they are born. You will need to continue wearing a condom for 3 months after stopping the medicine. Tell your health care provider right away if your partner becomes pregnant while you are taking this medicine. This may interfere with the ability to father a child. You should talk to your doctor or health care professional if you are concerned about your fertility. What side effects may I notice from receiving this medication? Side effects that you should report to your doctor or health care professional as soon as possible: allergic reactions like skin rash, itching or hives, swelling of the face, lips, or tongue blurred vision breathing problems changes in vision low blood counts - This drug may decrease the number of white blood cells, red blood cells and platelets. You may be at increased risk for infections and bleeding. nausea and vomiting pain, redness or irritation at site where injected pain, tingling, numbness in the hands or feet redness, blistering, peeling, or loosening of the skin, including inside the mouth signs of decreased platelets or bleeding - bruising, pinpoint red spots on the skin, black, tarry stools, nosebleeds signs of decreased red blood cells - unusually weak or tired, fainting spells, lightheadedness signs of infection - fever or chills, cough, sore throat, pain or difficulty passing urine swelling of the ankle, feet, hands Side effects that usually do not require medical attention (report to your doctor or health care professional if they continue or are bothersome): constipation diarrhea fingernail or toenail changes hair loss loss of  appetite mouth sores muscle pain This list may not describe all possible side effects. Call your doctor for medical advice about side effects. You may report side effects to FDA at 1-800-FDA-1088. Where should I keep my medication? This drug is given in a hospital or clinic and will not be stored at home. NOTE: This sheet is a summary. It may not cover all possible information. If you have questions about this medicine, talk to your doctor, pharmacist, or health care provider.  2023 Elsevier/Gold Standard (2021-02-27 00:00:00) Carboplatin injection What is this medication? CARBOPLATIN (KAR boe pla tin) is a chemotherapy drug. It targets fast dividing cells, like cancer cells, and causes these cells to die. This medicine is used to treat ovarian cancer and many other cancers. This medicine may be used for other purposes; ask your health care provider or pharmacist if you have questions. COMMON BRAND NAME(S): Paraplatin What should I tell my care team before I take this medication? They need to know if you have  any of these conditions: blood disorders hearing problems kidney disease recent or ongoing radiation therapy an unusual or allergic reaction to carboplatin, cisplatin, other chemotherapy, other medicines, foods, dyes, or preservatives pregnant or trying to get pregnant breast-feeding How should I use this medication? This drug is usually given as an infusion into a vein. It is administered in a hospital or clinic by a specially trained health care professional. Talk to your pediatrician regarding the use of this medicine in children. Special care may be needed. Overdosage: If you think you have taken too much of this medicine contact a poison control center or emergency room at once. NOTE: This medicine is only for you. Do not share this medicine with others. What if I miss a dose? It is important not to miss a dose. Call your doctor or health care professional if you are unable to  keep an appointment. What may interact with this medication? medicines for seizures medicines to increase blood counts like filgrastim, pegfilgrastim, sargramostim some antibiotics like amikacin, gentamicin, neomycin, streptomycin, tobramycin vaccines Talk to your doctor or health care professional before taking any of these medicines: acetaminophen aspirin ibuprofen ketoprofen naproxen This list may not describe all possible interactions. Give your health care provider a list of all the medicines, herbs, non-prescription drugs, or dietary supplements you use. Also tell them if you smoke, drink alcohol, or use illegal drugs. Some items may interact with your medicine. What should I watch for while using this medication? Your condition will be monitored carefully while you are receiving this medicine. You will need important blood work done while you are taking this medicine. This drug may make you feel generally unwell. This is not uncommon, as chemotherapy can affect healthy cells as well as cancer cells. Report any side effects. Continue your course of treatment even though you feel ill unless your doctor tells you to stop. In some cases, you may be given additional medicines to help with side effects. Follow all directions for their use. Call your doctor or health care professional for advice if you get a fever, chills or sore throat, or other symptoms of a cold or flu. Do not treat yourself. This drug decreases your body's ability to fight infections. Try to avoid being around people who are sick. This medicine may increase your risk to bruise or bleed. Call your doctor or health care professional if you notice any unusual bleeding. Be careful brushing and flossing your teeth or using a toothpick because you may get an infection or bleed more easily. If you have any dental work done, tell your dentist you are receiving this medicine. Avoid taking products that contain aspirin, acetaminophen,  ibuprofen, naproxen, or ketoprofen unless instructed by your doctor. These medicines may hide a fever. Do not become pregnant while taking this medicine. Women should inform their doctor if they wish to become pregnant or think they might be pregnant. There is a potential for serious side effects to an unborn child. Talk to your health care professional or pharmacist for more information. Do not breast-feed an infant while taking this medicine. What side effects may I notice from receiving this medication? Side effects that you should report to your doctor or health care professional as soon as possible: allergic reactions like skin rash, itching or hives, swelling of the face, lips, or tongue signs of infection - fever or chills, cough, sore throat, pain or difficulty passing urine signs of decreased platelets or bleeding - bruising, pinpoint red spots on the  skin, black, tarry stools, nosebleeds signs of decreased red blood cells - unusually weak or tired, fainting spells, lightheadedness breathing problems changes in hearing changes in vision chest pain high blood pressure low blood counts - This drug may decrease the number of white blood cells, red blood cells and platelets. You may be at increased risk for infections and bleeding. nausea and vomiting pain, swelling, redness or irritation at the injection site pain, tingling, numbness in the hands or feet problems with balance, talking, walking trouble passing urine or change in the amount of urine Side effects that usually do not require medical attention (report to your doctor or health care professional if they continue or are bothersome): hair loss loss of appetite metallic taste in the mouth or changes in taste This list may not describe all possible side effects. Call your doctor for medical advice about side effects. You may report side effects to FDA at 1-800-FDA-1088. Where should I keep my medication? This drug is given in a  hospital or clinic and will not be stored at home. NOTE: This sheet is a summary. It may not cover all possible information. If you have questions about this medicine, talk to your doctor, pharmacist, or health care provider.  2023 Elsevier/Gold Standard (2007-09-06 00:00:00)

## 2021-11-30 NOTE — Progress Notes (Signed)
[place] PROGRESS NOTE  Patient Care Team: Kelton Pillar, MD as PCP - General (Family Medicine) [name], MD as Consulting Physician (General Surgery) Benay Pike, MD as Consulting Physician (Hematology and Oncology) Kyung Rudd, MD as Consulting Physician (Radiation Oncology) Mauro Kaufmann, RN as Oncology Nurse Navigator Rockwell Germany, RN as Oncology Nurse Navigator  CHIEF COMPLAINTS/PURPOSE OF CONSULTATION:  Breast cancer  SUMMARY OF ONCOLOGIC HISTORY: Oncology History  Malignant neoplasm of upper-outer quadrant of right breast in female, estrogen receptor positive ([place])  08/12/2021 Mammogram   Mammogram showed possible mass in the right breast.  Diagnostic mammogram showed 2 highly suspicious upper outer right breast masses measuring 1.5 cm in the 9:30 position and 1.4 cm at the 10 o'clock position.  These masses or distortions mammographically spanning a distance of up to 3.5 cm.  No abnormal appearing right axillary lymph nodes.   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  Prognostic showed ER 99% positive strong staining PR 10% positive moderate staining, HER2 positive 3+ and Ki-67 of 30%.   09/08/2021 Initial Diagnosis   Malignant neoplasm of upper-outer quadrant of right breast in female, estrogen receptor positive ([place])   09/15/2021 Pathology Results   She had right breast lumpectomy which showed invasive ductal carcinoma, 3.6 cm in maximal extent involving inferior and medial margins, DCIS approaching to less than 0.1 cm of closest margin no metastatic carcinoma identified in 4 out of 4 lymph nodes.  She is scheduled for repeat surgery given positive margins.  Prior prognostic showed ER 99% positive strong staining intensity PR 10% positive moderate staining intensity, HER2 positive.   09/23/2021 Genetic Testing   Negative hereditary cancer genetic testing: no pathogenic variants  detected in [facility] CustomNext-cancer +RNAinsight Panel.  Report date is September 23, 2021.   The CustomNext-Cancer+RNAinsight panel offered by [facility] includes sequencing and rearrangement analysis for the following 47 genes:  APC, ATM, AXIN2, BARD1, BMPR1A, BRCA1, BRCA2, BRIP1, CDH1, CDK4, CDKN2A, CHEK2, DICER1, EPCAM, GREM1, HOXB13, MEN1, MLH1, MSH2, MSH3, MSH6, MUTYH, NBN, NF1, NF2, NTHL1, PALB2, PMS2, POLD1, POLE, PTEN, RAD51C, RAD51D, RECQL, RET, SDHA, SDHAF2, SDHB, SDHC, SDHD, SMAD4, SMARCA4, STK11, TP53, TSC1, TSC2, and VHL.  RNA data is routinely analyzed for use in variant interpretation for all genes.   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 DCIS 0.25 mg from true/new margin.   11/10/2021 -  Chemotherapy   Patient is on Treatment Plan : BREAST Docetaxel + Carboplatin + Trastuzumab ([place]) q21d / Trastuzumab q21d      INTERVAL HISTORY:  Rhonda Richardson returns for a follow up visit after receiving Cycle 2 of carboplatin/docetaxel and trastuzumab on 11/30/2021.  Since she was here, she had a fall. She fell and saw orthopaedics, no fractures.  She was given some tramadol, she took I sparingly. She takes tylenol and advil as well. No nausea or vomiting. She had some burning in throat, took some pepcid. No fevers or chills.   Rest of the pertinent 10 point ROS reviewed and negative.  MEDICAL HISTORY:  Past Medical History:  Diagnosis Date   Breast cancer ([place])    Hypertension     SURGICAL HISTORY: Past Surgical History:  Procedure Laterality Date   BREAST LUMPECTOMY WITH RADIOACTIVE SEED AND SENTINEL LYMPH NODE BIOPSY Right 09/15/2021   Procedure: RIGHT  BREAST SEED BRACKETED LUMPECTOMY AND SENTINEL NODE BIOPSY;  Surgeon: [name], MD;  Location: [facility];  Service: General;  Laterality: Right;   cataract surgery  Bilateral    DILATION AND CURETTAGE OF UTERUS  1970   EXCISION OF BREAST LESION Right 10/15/2021   Procedure: ASPIRATION RIGHT AXILLARY SEROMA;  Surgeon: [name], MD;  Location: [facility];  Service: General;  Laterality: Right;   eyelid tendon repair Bilateral    PORTACATH PLACEMENT N/A 09/15/2021   Procedure: PORT PLACEMENT;  Surgeon: [name], MD;  Location: [facility];  Service: General;  Laterality: N/A;   RE-EXCISION OF BREAST LUMPECTOMY Right 10/15/2021   Procedure: RE-EXCISION LUMPECTOMY RIGHT BREAST;  Surgeon: [name], MD;  Location: [facility];  Service: General;  Laterality: Right;    SOCIAL HISTORY: Social History   Socioeconomic History   Marital status: Married    Spouse name: Not on file   Number of children: Not on file   Years of education: Not on file   Highest education level: Not on file  Occupational History   Not on file  Tobacco Use   Smoking status: Never   Smokeless tobacco: Not on file  Vaping Use   Vaping Use: Never used  Substance and Sexual Activity   Alcohol use: Yes    Comment: 3- 4 drinks   Drug use: Never   Sexual activity: Not Currently  Other Topics Concern   Not on file  Social History Narrative   Not on file   Social Determinants of Health   Financial Resource Strain: Low Risk  (09/09/2021)   Overall Financial Resource Strain (CARDIA)    Difficulty of Paying Living Expenses: Not hard at all  Food Insecurity: No Food Insecurity (09/09/2021)   Hunger Vital Sign    Worried About Running Out of Food in the Last Year: Never true    Ran Out of Food in the Last Year: Never true  Transportation Needs: No Transportation Needs (09/09/2021)   PRAPARE - Hydrologist (Medical): No    Lack of Transportation (Non-Medical): No  Physical Activity: Not on file  Stress: Not on file  Social Connections: Not on file  Intimate Partner Violence: Not on file    FAMILY HISTORY: Family History  Problem  Relation Age of Onset   Heart disease Father 71   Thyroid cancer Other 26       niece's daughter    ALLERGIES:  is allergic to macrodantin [nitrofurantoin].  MEDICATIONS:  Current Outpatient Medications  Medication Sig Dispense Refill   acetaminophen (TYLENOL) 325 MG tablet Take 325 mg by mouth every 6 (six) hours as needed for moderate pain.     benazepril (LOTENSIN) 20 MG tablet Take 20 mg by mouth daily.     cholecalciferol (VITAMIN D3) 25 MCG (1000 UNIT) tablet Take 1,000 Units by mouth daily.     dexamethasone (DECADRON) 4 MG tablet Take 2 tablets (8 mg total) by mouth 2 (two) times daily. Take decadron  Twice daily the day before taxotere then daily x 3 days after carboplatin  chemo 30 tablet 1   famotidine (PEPCID) 20 MG tablet Take 20 mg by mouth daily as needed for heartburn or indigestion.     hydrochlorothiazide (MICROZIDE) 12.5 MG capsule Take 12.5 mg by mouth daily.     ibuprofen (ADVIL) 200 MG tablet Take 200 mg by mouth every 6 (six) hours as needed for moderate pain.  lidocaine-prilocaine (EMLA) cream Apply to affected area once 30 g 3   Magnesium 250 MG TABS Take 250 mg by mouth daily.     ondansetron (ZOFRAN) 8 MG tablet Take 1 tablet (8 mg total) by mouth 2 (two) times daily as needed for refractory nausea / vomiting. Start on day 3 after chemo. 30 tablet 1   prochlorperazine (COMPAZINE) 10 MG tablet Take 1 tablet (10 mg total) by mouth every 6 (six) hours as needed (Nausea or vomiting). 30 tablet 1   Red Yeast Rice 600 MG CAPS Take 600 mg by mouth daily.     traMADol (ULTRAM) 50 MG tablet Take by mouth every 6 (six) hours as needed.     Vitamins-Lipotropics (MULTI-VITAMIN HP/MINERALS PO) Take 1 tablet by mouth daily.     No current facility-administered medications for this visit.    REVIEW OF SYSTEMS:   Constitutional: Denies fevers, chills or abnormal night sweats Eyes: Denies blurriness of vision, double vision or watery eyes Ears, nose, mouth, throat, and  face: Denies mucositis or sore throat Respiratory: Denies cough, dyspnea or wheezes Cardiovascular: Denies palpitation, chest discomfort or lower extremity swelling Gastrointestinal:  Denies nausea. +Heartburn, constipation Skin: Denies abnormal skin rashes Lymphatics: Denies new lymphadenopathy or easy bruising Neurological:Denies numbness, tingling or new weaknesses Behavioral/Psych: Mood is stable, no new changes  Breast: Denies any palpable lumps or discharge All other systems were reviewed with the patient and are negative.  PHYSICAL EXAMINATION: ECOG PERFORMANCE STATUS: 0 - Asymptomatic  Vitals:   11/30/21 0831  BP: (!) 163/77  Pulse: (!) 101  Resp: 18  Temp: 97.6 F (36.4 C)  SpO2: 100%    Filed Weights   11/30/21 0831  Weight: 138 lb 11.2 oz (62.9 kg)   Physical Exam Constitutional:      Appearance: Normal appearance.  Cardiovascular:     Rate and Rhythm: Normal rate and regular rhythm.  Pulmonary:     Effort: Pulmonary effort is normal.     Breath sounds: Normal breath sounds.  Musculoskeletal:     Cervical back: Normal range of motion and neck supple. No rigidity.  Lymphadenopathy:     Cervical: No cervical adenopathy.  Skin:    General: Skin is warm and dry.  Neurological:     General: No focal deficit present.     Mental Status: She is alert.  Psychiatric:        Mood and Affect: Mood normal.       LABORATORY DATA:  I have reviewed the data as listed Lab Results  Component Value Date   WBC 11.5 (H) 11/30/2021   HGB 10.3 (L) 11/30/2021   HCT 30.2 (L) 11/30/2021   MCV 78.0 (L) 11/30/2021   PLT 395 11/30/2021   Lab Results  Component Value Date   NA 133 (L) 11/18/2021   K 4.1 11/18/2021   CL 97 (L) 11/18/2021   CO2 29 11/18/2021    RADIOGRAPHIC STUDIES: I have personally reviewed the radiological reports and agreed with the findings in the report.  ASSESSMENT AND PLAN:  Rhonda Richardson is a 81 y.o. female who presents for a follow up  for right breast cancer.   #Malignant neoplasm of upper-outer quadrant of right breast in female, estrogen receptor positive  --ER/PR positive, HER2 amplified.  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 Media margin showed focal high-grade ductal carcinoma in situ, solid  type with necrosis, negative for invasive carcinoma. Inferior and posterior margin negative for carcinoma.  --Given large tumor, triple positive, recommend dose modified TCH for 6 cycles followed by adjuvant herceptin vs taxol with herceptin. --Baseline echo from 09/14/2021 was satisfactory with EF 55-60%.  --Patient started Cycle 1, Day 1 of [place] on 11/10/2021. GCSF injection given on Day 3 of each cycle.  --She was seen by Murray Hodgkins after cycle 1 and did not have any major toxicities. --She is here today before planned cycle 2 of chemotherapy. -- NO major limiting dose toxicity, ok to continue current dose of chemotherapy -- Return to clinic before planned cycle 3  #Acid Reflux:  --continue pepcid  #Right hip/leg pain: --Following up with ortho, No fractures Taking some tramadol -- Again advised her not to take too much Advil or tramadol.  She understands to take it sparingly.  #Constipation: --Continue to take miralax and/or dulcolax.  All questions were answered. The patient knows to call the clinic with any problems, questions or concerns.   I have spent a total of 30 minutes minutes of face-to-face and non-face-to-face time, preparing to see the patient, performing a medically appropriate examination, counseling and educating the patient, documenting clinical information in the electronic health record, and care coordination.

## 2021-12-01 DIAGNOSIS — M5416 Radiculopathy, lumbar region: Secondary | ICD-10-CM | POA: Diagnosis not present

## 2021-12-01 DIAGNOSIS — M25561 Pain in right knee: Secondary | ICD-10-CM | POA: Diagnosis not present

## 2021-12-01 DIAGNOSIS — M5136 Other intervertebral disc degeneration, lumbar region: Secondary | ICD-10-CM | POA: Diagnosis not present

## 2021-12-02 ENCOUNTER — Other Ambulatory Visit: Payer: Self-pay

## 2021-12-02 ENCOUNTER — Inpatient Hospital Stay: Payer: Medicare Other

## 2021-12-02 VITALS — BP 143/66 | HR 85 | Temp 97.6°F | Resp 18

## 2021-12-02 DIAGNOSIS — R12 Heartburn: Secondary | ICD-10-CM | POA: Diagnosis not present

## 2021-12-02 DIAGNOSIS — C50411 Malignant neoplasm of upper-outer quadrant of right female breast: Secondary | ICD-10-CM | POA: Diagnosis not present

## 2021-12-02 DIAGNOSIS — Z961 Presence of intraocular lens: Secondary | ICD-10-CM | POA: Diagnosis not present

## 2021-12-02 DIAGNOSIS — Z170 Estrogen receptor positive status [ER+]: Secondary | ICD-10-CM

## 2021-12-02 DIAGNOSIS — H52203 Unspecified astigmatism, bilateral: Secondary | ICD-10-CM | POA: Diagnosis not present

## 2021-12-02 DIAGNOSIS — Z5111 Encounter for antineoplastic chemotherapy: Secondary | ICD-10-CM | POA: Diagnosis not present

## 2021-12-02 DIAGNOSIS — Z5112 Encounter for antineoplastic immunotherapy: Secondary | ICD-10-CM | POA: Diagnosis not present

## 2021-12-02 DIAGNOSIS — Z5189 Encounter for other specified aftercare: Secondary | ICD-10-CM | POA: Diagnosis not present

## 2021-12-02 MED ORDER — PEGFILGRASTIM-CBQV 6 MG/0.6ML ~~LOC~~ SOSY
6.0000 mg | PREFILLED_SYRINGE | Freq: Once | SUBCUTANEOUS | Status: AC
  Administered 2021-12-02: 6 mg via SUBCUTANEOUS
  Filled 2021-12-02: qty 0.6

## 2021-12-10 ENCOUNTER — Other Ambulatory Visit: Payer: Self-pay

## 2021-12-14 ENCOUNTER — Other Ambulatory Visit: Payer: Self-pay | Admitting: Hematology and Oncology

## 2021-12-14 DIAGNOSIS — Z170 Estrogen receptor positive status [ER+]: Secondary | ICD-10-CM

## 2021-12-15 ENCOUNTER — Other Ambulatory Visit: Payer: Self-pay | Admitting: Hematology and Oncology

## 2021-12-15 DIAGNOSIS — Z170 Estrogen receptor positive status [ER+]: Secondary | ICD-10-CM

## 2021-12-15 NOTE — Progress Notes (Signed)
Treatment plan changed per IS

## 2021-12-15 NOTE — Therapy (Unsigned)
OUTPATIENT PHYSICAL THERAPY FEMALE PELVIC EVALUATION   Patient Name: Rhonda Richardson MRN: [id number] DOB:04/07/1941, 81 y.o., female Today's Date: 12/16/2021   PT End of Session - 12/16/21 0929     Visit Number 1   pelvic   Date for PT Re-Evaluation 03/10/22    Authorization Type Medicare    Authorization - Visit Number 3    Authorization - Number of Visits 10    PT Start Time 0930    PT Stop Time 1010    PT Time Calculation (min) 40 min    Activity Tolerance Patient tolerated treatment well    Behavior During Therapy WFL for tasks assessed/performed             Past Medical History:  Diagnosis Date   Breast cancer ([place])    Hypertension    Past Surgical History:  Procedure Laterality Date   BREAST LUMPECTOMY WITH RADIOACTIVE SEED AND SENTINEL LYMPH NODE BIOPSY Right 09/15/2021   Procedure: RIGHT BREAST SEED BRACKETED LUMPECTOMY AND SENTINEL NODE BIOPSY;  Surgeon: [name], MD;  Location: [facility];  Service: General;  Laterality: Right;   cataract surgery Bilateral    DILATION AND CURETTAGE OF UTERUS  1970   EXCISION OF BREAST LESION Right 10/15/2021   Procedure: ASPIRATION RIGHT AXILLARY SEROMA;  Surgeon: [name], MD;  Location: [facility];  Service: General;  Laterality: Right;   eyelid tendon repair Bilateral    PORTACATH PLACEMENT N/A 09/15/2021   Procedure: PORT PLACEMENT;  Surgeon: [name], MD;  Location: [facility];  Service: General;  Laterality: N/A;   RE-EXCISION OF BREAST LUMPECTOMY Right 10/15/2021   Procedure: RE-EXCISION LUMPECTOMY RIGHT BREAST;  Surgeon: [name], MD;  Location: [facility];  Service: General;  Laterality: Right;   Patient Active Problem List   Diagnosis Date Noted   Port-A-Cath in place 11/18/2021   Genetic testing 09/28/2021   Malignant neoplasm of upper-outer quadrant of right breast in female, estrogen receptor positive ([place]) 09/08/2021    PCP: Kelton Pillar, MD  REFERRING PROVIDER: Benay Pike, MD  REFERRING DIAG: M62.89 (ICD-10-CM) - Pelvic floor dysfunction  THERAPY DIAG:  Muscle weakness (generalized)  Other abnormalities of gait and mobility  Aftercare following surgery for neoplasm  Rationale for Evaluation and Treatment Rehabilitation  ONSET DATE: 09/15/2021  SUBJECTIVE:                                                                                                                                                                                           SUBJECTIVE STATEMENT: Constipation; I have been using Replens. Patient reports no urinary leakage. I had 2  chemotherapy treatments. Managing her constipation with Miralax and with her medications. I feel unsteady on my feet. When go down stairs she will plant right foot and feels insecure. She goes up stairs step to step. Trouble getting up from squat. Sometimes feels like she may fall.  Fluid intake: Yes: drinks water     PAIN:  Are you having pain? No   PRECAUTIONS: Other: Breast cancer  WEIGHT BEARING RESTRICTIONS No  FALLS:  Has patient fallen in last 6 months? Yes on 11/13/2021 she fell after the chemotherapy treatment due to sciatica.  LIVING ENVIRONMENT: Lives with: lives with their spouse  OCCUPATION: part time  PLOF: Independent  PATIENT GOALS pelvic floor education  PERTINENT HISTORY:  Patient was diagnosed on 08/12/2021 with right grade 2 invasive ductal carcinoma breast cancer. She had a right lumpectomy and sentinel node biopsy (0/4 nodes positive) on 09/15/2021 followed by a re-excision on 10/15/2021. It is triple positive with a Ki67 of 30%. Presently having chemotherapy.   BOWEL MOVEMENT Pain with bowel movement: No  URINATION Pain with urination: No Fully empty bladder: No  INTERCOURSE Pain with intercourse:  none and uses lubricant  PREGNANCY Vaginal deliveries 3 Tearing No C-section deliveries 0 Currently pregnant No     OBJECTIVE:   DIAGNOSTIC FINDINGS:   none   COGNITION:  Overall cognitive status: Within functional limits for tasks assessed     SENSATION:  Light touch: Appears intact on left, decreased on lateral and medial right lower leg  Proprioception: Appears intact                POSTURE: No Significant postural limitations Sit to stand 5 times 15 seconds.  TUG 11 sec Stand on right leg 3 sec Stand on left leg 15 sec with no issues Tandem stance with left leg in front 30 sec Tandem stance with right leg in front  30 sec with increased body sway  PELVIC ALIGNMENT:  LUMBARAROM/PROM  A/PROM A/PROM  eval  Extension Decreased by 25%    LOWER EXTREMITY KTG:[id number] hip ROM is full  LOWER EXTREMITY MMT:  MMT Right eval Left eval  Hip flexion 4/5 5/5  Hip extension 4/5 4/5  Hip abduction 3+/5 3+/5  Hip external rotation 4/5 4/5  Knee flexion 5/5 5/5  Knee extension 4/5 5/5  Ankle dorsiflexion 4/5 5/5  Ankle plantarflexion 2/5 3/5  Ankle inversion 5/5 5/5  Ankle eversion 5/5 5/5    PALPATION:   General  no tenderness                      TODAY'S TREATMENT  EVAL Date: 12/16/2021 HEP established-see below    PATIENT EDUCATION:  Education details: education on vaginal moisturizers and lubricants. Handed patient samples of each.  Person educated: Patient Education method: Explanation and Handouts Education comprehension: verbalized understanding   HOME EXERCISE PROGRAM: See above.   ASSESSMENT:  CLINICAL IMPRESSION: Patient is a 81 y.o. female who was seen today for physical therapy evaluation and treatment for pelvic floor dysfunction. Patient  was assessed for pelvic floor dysfunction. She is not having pelvic pain or leakage of urine. She was educated on vaginal moisturizers and lubricants due to the changes that occur during chemotherapy with the pelvic floor. She has fallen after her last chemotherapy session and her legs especially her right is feeling week. She feels unsteady on her feet. She has  decreased sensation on the right lower leg. Sit to stand 5 times 15 seconds. TUG 11  sec. Stand on right leg 3 sec. Stand on left leg 15 sec with no issues.  Tandem stance with left leg in front 30 sec. Tandem stance with right leg in front  30 sec with increased body sway. Patient has weakness throughout right lower extremity. She goes up and down steps with step to step pattern due to placing full weight on right lower extremity causes an unsteadiness. She has trouble getting up from a squat. Patient reports she has felt weaker as she is having more chemotherapy treatments. Patient will benefit from skilled therapy to improve balance and strength as she continues with her chemotherapy treatments to reduce the chance of falling again and improve her quality of life.    OBJECTIVE IMPAIRMENTS Abnormal gait, decreased activity tolerance, decreased balance, decreased coordination, decreased endurance, difficulty walking, and decreased strength.   ACTIVITY LIMITATIONS lifting, bending, squatting, stairs, transfers, and locomotion level  PARTICIPATION LIMITATIONS: cleaning, laundry, shopping, community activity, and occupation  PERSONAL FACTORS Age, Fitness, Past/current experiences, and 1-2 comorbidities: breast cancer and having chemotherapy  are also affecting patient's functional outcome.   REHAB POTENTIAL: Excellent  CLINICAL DECISION MAKING: Evolving/moderate complexity  EVALUATION COMPLEXITY: Moderate   GOALS: Goals reviewed with patient? Yes  SHORT TERM GOALS: Target date: 01/13/2022  Patient is independent with initial HEP for balance and strength.  Baseline: Goal status: INITIAL  2.  Patient reports her endurance and steadiness on her right leg has improved >/= 25% the 2 weeks prior to her chemotherapy treatments.  Baseline:  Goal status: INITIAL  3.  Patient educated on vaginal moisturizers and lubricants to promote good vaginal health.  Baseline:  Goal status: MET  12/16/2021  LONG TERM GOALS: Target date: 03/10/2022   Patient independent with advanced HEP for balance and strength to reduce the chance of falls.  Baseline:  Goal status: INITIAL  2.  Sit to stand 5x is </= 12.6 seconds to reduce the chance of her falling.  Baseline:  Goal status: INITIAL  3.  Patient able to stand on right leg for 15 seconds so she is able to go up and down steps with step over step pattern.  Baseline:  Goal status: INITIAL  4.  Tandem stance with right leg for 30 seconds with </= 50% less body swaying due to improved strength and balance.  Baseline:  Goal status: INITIAL  5.  Patient is able to squat and stand up with >/= 50% greater ease due to increased in right lower extremity strength.  Baseline:  Goal status: INITIAL   PLAN: PT FREQUENCY: 2x/week  PT DURATION: 12 weeks  PLANNED INTERVENTIONS: Therapeutic exercises, Therapeutic activity, Neuromuscular re-education, Balance training, Gait training, Patient/Family education, Self Care, and Stair training  PLAN FOR NEXT SESSION: work on right lower extremity strength, balance exercises, program will focus on balance strength and endurance while she is getting chemotherapy   Earlie Counts, PT 12/16/21 11:44 AM

## 2021-12-16 ENCOUNTER — Ambulatory Visit: Payer: Medicare Other | Attending: Hematology and Oncology | Admitting: Physical Therapy

## 2021-12-16 ENCOUNTER — Other Ambulatory Visit: Payer: Self-pay

## 2021-12-16 ENCOUNTER — Encounter: Payer: Self-pay | Admitting: Physical Therapy

## 2021-12-16 DIAGNOSIS — Z170 Estrogen receptor positive status [ER+]: Secondary | ICD-10-CM | POA: Insufficient documentation

## 2021-12-16 DIAGNOSIS — M6289 Other specified disorders of muscle: Secondary | ICD-10-CM | POA: Diagnosis not present

## 2021-12-16 DIAGNOSIS — C50411 Malignant neoplasm of upper-outer quadrant of right female breast: Secondary | ICD-10-CM | POA: Insufficient documentation

## 2021-12-16 DIAGNOSIS — Z9221 Personal history of antineoplastic chemotherapy: Secondary | ICD-10-CM | POA: Insufficient documentation

## 2021-12-16 DIAGNOSIS — R262 Difficulty in walking, not elsewhere classified: Secondary | ICD-10-CM | POA: Diagnosis not present

## 2021-12-16 DIAGNOSIS — M6281 Muscle weakness (generalized): Secondary | ICD-10-CM | POA: Diagnosis not present

## 2021-12-16 DIAGNOSIS — R269 Unspecified abnormalities of gait and mobility: Secondary | ICD-10-CM | POA: Insufficient documentation

## 2021-12-16 DIAGNOSIS — Z483 Aftercare following surgery for neoplasm: Secondary | ICD-10-CM

## 2021-12-16 DIAGNOSIS — R2689 Other abnormalities of gait and mobility: Secondary | ICD-10-CM

## 2021-12-16 NOTE — Patient Instructions (Signed)
Moisturizers They are used in the vagina to hydrate the mucous membrane that make up the vaginal canal. Designed to keep a more normal acid balance (ph) Once placed in the vagina, it will last between two to three days.  Use 2-3 times per week at bedtime  Ingredients to avoid is glycerin and fragrance, can increase chance of infection Should not be used just before sex due to causing irritation Most are gels administered either in a tampon-shaped applicator or as a vaginal suppository. They are non-hormonal.   Types of Moisturizers(internal use) 2 times per week at night  Vitamin E vaginal suppositories- Whole foods, Amazon Moist Again Coconut oil- can break down condoms Julva- (Do no use if on Tamoxifen) amazon Yes moisturizer- amazon NeuEve Silk , NeuEve Silver for menopausal or over 65 (if have severe vaginal atrophy or cancer treatments use NeuEve Silk for  1 month than move to The Pepsi)- [employer], [place] Olive and Bee intimate cream- www.oliveandbee.com.au Mae vaginal moisturizer- Amazon Aloe Good Clean Love Hyaluronic acid Hyalofemme   Creams to use externally on the Vulva area Albertson's (good for for cancer patients that had radiation to the area)- [place] or [employer].FlyingBasics.com.br V-magic cream - amazon Julva-amazon Vital "V Wild Yam salve ( help moisturize and help with thinning vulvar area, does have Fanshawe by Irwin Brakeman labial moisturizer (Amazon,  Coconut or olive oil aloe Good Clean Love Enchanted Rose by intimate rose  Things to avoid in the vaginal area Do not use things to irritate the vulvar area No lotions just specialized creams for the vulva area- Neogyn, V-magic, No soaps; can use Aveeno or Calendula cleanser if needed. Must be gentle No deodorants No douches Good to sleep without underwear to let the vaginal area to air out No scrubbing: spread the lips to let  warm water rinse over labias and pat dry   Lubrication Used for intercourse to reduce friction Avoid ones that have glycerin, nonoxynol-9, petroleum, propylene glycol, chlorhexidine gluconate, warming gels, tingling gels, icing or cooling gel, scented Avoid parabens due to a preservative similar to female sex hormone May need to be reapplied once or several times during sexual activity Can be applied to both partners genitals prior to vaginal penetration to minimize friction or irritation Prevent irritation and mucosal tears that cause post coital pain and increased the risk of vaginal and urinary tract infections Oil-based lubricants cannot be used with condoms due to breaking them down.  Least likely to irritate vaginal tissue.  Plant based-lubes are safe Silicone-based lubrication are thicker and last long and used for post-menopausal women  Vaginal Lubricators Here is a list of some suggested lubricators you can use for intercourse. Use the most hypoallergenic product.  You can place on you or your partner.  Slippery Stuff ( water based) Sylk or Sliquid Natural H2O ( good  if frequent UTI's)- walmart, amazon Sliquid organics silk-(aloe and silicone based ) [employer] (www.blossom-organics.com)- (aloe based ) Coconut oil, olive oil -not good with condoms  PJur Woman Nude- (water based) amazon Uberlube- ( silicon) [place] has an organic one Yes lubricant- (water based and has plant oil based similar to silicone) [employer] Platinum-Silicone, Target, [facility] Olive and Bee intimate cream-  www.oliveandbee.com.au Pink - [employer] Erosense Sync- walmart, amazon Coconu- coconu.com Desert Altria Group to avoid in lubricants are glycerin, warming gels, tingling gels, icing or cooling  gels, and scented gels.  Also avoid Vaseline. KY jelly,  and Astroglide contain chlorhexidine which kills good bacteria(lactobacilli)  Things to avoid in the vaginal  area Do not use things to irritate the vulvar area No lotions- see below Soaps you  can use :Aveeno, Calendula, Good Clean Love cleanser if needed. Must be gentle No deodorants No douches Good to sleep without underwear to let the vaginal area to air out No scrubbing: spread the lips to let warm water rinse over labias and pat dry  Creams that can be used on the Vulva Area V [employer], [place] Pro-Meno Wild Yam Cream Coconut oil, olive oil Cleo by [employer] labial moisturizer -Dover [employer],  [place] Releveum ( lidocaine) or [place] Yes [facility] [address] [place], [place] [address] Phone # [phone] Fax [phone]

## 2021-12-17 ENCOUNTER — Other Ambulatory Visit: Payer: Self-pay

## 2021-12-17 ENCOUNTER — Ambulatory Visit: Payer: Medicare Other | Admitting: Physical Therapy

## 2021-12-17 DIAGNOSIS — Z9221 Personal history of antineoplastic chemotherapy: Secondary | ICD-10-CM | POA: Diagnosis not present

## 2021-12-17 DIAGNOSIS — M6281 Muscle weakness (generalized): Secondary | ICD-10-CM

## 2021-12-17 DIAGNOSIS — M6289 Other specified disorders of muscle: Secondary | ICD-10-CM | POA: Diagnosis not present

## 2021-12-17 DIAGNOSIS — Z483 Aftercare following surgery for neoplasm: Secondary | ICD-10-CM

## 2021-12-17 DIAGNOSIS — R2689 Other abnormalities of gait and mobility: Secondary | ICD-10-CM

## 2021-12-17 DIAGNOSIS — Z170 Estrogen receptor positive status [ER+]: Secondary | ICD-10-CM | POA: Diagnosis not present

## 2021-12-17 DIAGNOSIS — C50411 Malignant neoplasm of upper-outer quadrant of right female breast: Secondary | ICD-10-CM | POA: Diagnosis not present

## 2021-12-17 DIAGNOSIS — R269 Unspecified abnormalities of gait and mobility: Secondary | ICD-10-CM | POA: Diagnosis not present

## 2021-12-17 NOTE — Therapy (Signed)
OUTPATIENT PHYSICAL THERAPY PROGRESS NOTE   Patient Name: Rhonda Richardson MRN: [id number] DOB:1940/10/17, 81 y.o., female Today's Date: 12/17/2021   PT End of Session - 12/17/21 1145     Visit Number 2    Date for PT Re-Evaluation 03/10/22    Authorization Type Medicare    Authorization - Number of Visits 10    PT Start Time 1145    PT Stop Time 1225    PT Time Calculation (min) 40 min    Activity Tolerance Patient tolerated treatment well             Past Medical History:  Diagnosis Date   Breast cancer ([place])    Hypertension    Past Surgical History:  Procedure Laterality Date   BREAST LUMPECTOMY WITH RADIOACTIVE SEED AND SENTINEL LYMPH NODE BIOPSY Right 09/15/2021   Procedure: RIGHT BREAST SEED BRACKETED LUMPECTOMY AND SENTINEL NODE BIOPSY;  Surgeon: [name], MD;  Location: [place];  Service: General;  Laterality: Right;   cataract surgery Bilateral    DILATION AND CURETTAGE OF UTERUS  1970   EXCISION OF BREAST LESION Right 10/15/2021   Procedure: ASPIRATION RIGHT AXILLARY SEROMA;  Surgeon: [name], MD;  Location: [place];  Service: General;  Laterality: Right;   eyelid tendon repair Bilateral    PORTACATH PLACEMENT N/A 09/15/2021   Procedure: PORT PLACEMENT;  Surgeon: [name], MD;  Location: [place];  Service: General;  Laterality: N/A;   RE-EXCISION OF BREAST LUMPECTOMY Right 10/15/2021   Procedure: RE-EXCISION LUMPECTOMY RIGHT BREAST;  Surgeon: [name], MD;  Location: [place];  Service: General;  Laterality: Right;   Patient Active Problem List   Diagnosis Date Noted   Port-A-Cath in place 11/18/2021   Genetic testing 09/28/2021   Malignant neoplasm of upper-outer quadrant of right breast in female, estrogen receptor positive ([place]) 09/08/2021    PCP: Kelton Pillar, MD  REFERRING PROVIDER: Benay Pike, MD  REFERRING DIAG: M62.89 (ICD-10-CM) - Pelvic floor dysfunction  THERAPY DIAG:  Muscle weakness  (generalized)  Other abnormalities of gait and mobility  Aftercare following surgery for neoplasm  Rationale for Evaluation and Treatment Rehabilitation  ONSET DATE: 09/15/2021  SUBJECTIVE:                                                                                                                                                                                           SUBJECTIVE STATEMENT: Pain comes and goes, more at night.  No reflexes.  Constant numbness.  Occasionally feels like leg will buckle.  I've been practicing standing on one leg I can do longer than 3 sec  in my shoe.    PAIN:  Are you having pain? Yes 5/10   PRECAUTIONS: Other: Breast cancer  WEIGHT BEARING RESTRICTIONS No  FALLS:  Has patient fallen in last 6 months? Yes on 11/13/2021 she fell after the chemotherapy treatment due to sciatica.  LIVING ENVIRONMENT: Lives with: lives with their spouse  OCCUPATION: part time  PLOF: Independent  PATIENT GOALS pelvic floor education  PERTINENT HISTORY:  Patient was diagnosed on 08/12/2021 with right grade 2 invasive ductal carcinoma breast cancer. She had a right lumpectomy and sentinel node biopsy (0/4 nodes positive) on 09/15/2021 followed by a re-excision on 10/15/2021. It is triple positive with a Ki67 of 30%. Presently having chemotherapy.   BOWEL MOVEMENT Pain with bowel movement: No  URINATION Pain with urination: No Fully empty bladder: No  INTERCOURSE Pain with intercourse:  none and uses lubricant  PREGNANCY Vaginal deliveries 3 Tearing No C-section deliveries 0 Currently pregnant No     OBJECTIVE:   DIAGNOSTIC FINDINGS:  none   COGNITION:  Overall cognitive status: Within functional limits for tasks assessed     SENSATION:  Light touch: Appears intact on left, decreased on lateral and medial right lower leg  Proprioception: Appears intact                POSTURE: No Significant postural limitations Sit to stand 5 times 15 seconds.   TUG 11 sec Stand on right leg 3 sec Stand on left leg 15 sec with no issues Tandem stance with left leg in front 30 sec Tandem stance with right leg in front  30 sec with increased body sway  PELVIC ALIGNMENT:  LUMBARAROM/PROM  A/PROM A/PROM  eval  Extension Decreased by 25%    LOWER EXTREMITY ONG:[id number] hip ROM is full  LOWER EXTREMITY MMT:  MMT Right eval Left eval  Hip flexion 4/5 5/5  Hip extension 4/5 4/5  Hip abduction 3+/5 3+/5  Hip external rotation 4/5 4/5  Knee flexion 5/5 5/5  Knee extension 4/5 5/5  Ankle dorsiflexion 4/5 5/5  Ankle plantarflexion 2/5 3/5  Ankle inversion 5/5 5/5  Ankle eversion 5/5 5/5    PALPATION:   General  no tenderness                      TODAY'S TREATMENT   9/7: Seated piriformis stretch 30 sec 3x  Seated neural floss 5x right/left Seated hip flexion/abduction up and over low cone on right 15x Sit to stand mat table 10x no UE assist with cues for symmetrical weight bearing Seated red band figure 8 around feet knee extension 15x right Seated red band clams 15x 6 inch step ups 10x with bil UE railing use Therapeutic activity:  sit to stand, standing, walking, ascending/descending curbs and steps    EVAL Date: 12/16/2021 HEP established-see below    PATIENT EDUCATION:  Education details: education on vaginal moisturizers and lubricants. Handed patient samples of each.  Person educated: Patient Education method: Theatre stage manager Education comprehension: verbalized understanding   HOME EXERCISE PROGRAM: Access Code: [email or web address] URL: [email or web address] Date: 12/17/2021 Prepared by: Ruben Im  Exercises - Seated Hip External Rotation Stretch  - 1 x daily - 7 x weekly - 1 sets - 3 reps - 30 hold - Seated Slump Nerve Glide  - 1 x daily - 7 x weekly - 1 sets - 5 reps - Seated March  - 1 x daily - 7 x weekly - 1 sets - 10 reps - Sit  to Stand  - 1 x daily - 7 x weekly - 1 sets - 10 reps -  Seated Hip Abduction with Resistance  - 1 x daily - 7 x weekly - 1 sets - 10 reps - Forward Step Up with Counter Support  - 1 x daily - 7 x weekly - 1 sets - 10 reps Given red band for home  ASSESSMENT:  CLINICAL IMPRESSION: The patient returns for follow up treatment for right LE weakness causing  knee instability and lower leg paresthesia.  She has some pain associated with this as well.  Initiated neural mobility ex's in addition to strengthening.  She reports decreased pain at the end of session.  Verbal cues for symmetrical weight bearing with sit to stand.  Therapist monitoring response carefully as patient is undergoing chemo treatments.   OBJECTIVE IMPAIRMENTS Abnormal gait, decreased activity tolerance, decreased balance, decreased coordination, decreased endurance, difficulty walking, and decreased strength.   ACTIVITY LIMITATIONS lifting, bending, squatting, stairs, transfers, and locomotion level  PARTICIPATION LIMITATIONS: cleaning, laundry, shopping, community activity, and occupation  PERSONAL FACTORS Age, Fitness, Past/current experiences, and 1-2 comorbidities: breast cancer and having chemotherapy  are also affecting patient's functional outcome.   REHAB POTENTIAL: Excellent  CLINICAL DECISION MAKING: Evolving/moderate complexity  EVALUATION COMPLEXITY: Moderate   GOALS: Goals reviewed with patient? Yes  SHORT TERM GOALS: Target date: 01/13/2022  Patient is independent with initial HEP for balance and strength.  Baseline: Goal status: INITIAL  2.  Patient reports her endurance and steadiness on her right leg has improved >/= 25% the 2 weeks prior to her chemotherapy treatments.  Baseline:  Goal status: INITIAL  3.  Patient educated on vaginal moisturizers and lubricants to promote good vaginal health.  Baseline:  Goal status: MET 12/16/2021  LONG TERM GOALS: Target date: 03/10/2022   Patient independent with advanced HEP for balance and strength to reduce  the chance of falls.  Baseline:  Goal status: INITIAL  2.  Sit to stand 5x is </= 12.6 seconds to reduce the chance of her falling.  Baseline:  Goal status: INITIAL  3.  Patient able to stand on right leg for 15 seconds so she is able to go up and down steps with step over step pattern.  Baseline:  Goal status: INITIAL  4.  Tandem stance with right leg for 30 seconds with </= 50% less body swaying due to improved strength and balance.  Baseline:  Goal status: INITIAL  5.  Patient is able to squat and stand up with >/= 50% greater ease due to increased in right lower extremity strength.  Baseline:  Goal status: INITIAL   PLAN: PT FREQUENCY: 2x/week  PT DURATION: 12 weeks  PLANNED INTERVENTIONS: Therapeutic exercises, Therapeutic activity, Neuromuscular re-education, Balance training, Gait training, Patient/Family education, Self Care, and Stair training  PLAN FOR NEXT SESSION: review and progress HEP as needed;  work on right lower extremity strength, balance exercises, program will focus on balance strength and endurance while she is getting chemotherapy   Ruben Im, PT 12/17/21 12:29 PM Phone: [phone] Fax: [phone]

## 2021-12-18 MED FILL — Fosaprepitant Dimeglumine For IV Infusion 150 MG (Base Eq): INTRAVENOUS | Qty: 5 | Status: AC

## 2021-12-18 MED FILL — Dexamethasone Sodium Phosphate Inj 100 MG/10ML: INTRAMUSCULAR | Qty: 1 | Status: AC

## 2021-12-21 ENCOUNTER — Encounter: Payer: Self-pay | Admitting: *Deleted

## 2021-12-21 ENCOUNTER — Telehealth: Payer: Self-pay | Admitting: *Deleted

## 2021-12-21 ENCOUNTER — Encounter: Payer: Self-pay | Admitting: Hematology and Oncology

## 2021-12-21 ENCOUNTER — Other Ambulatory Visit: Payer: Self-pay | Admitting: *Deleted

## 2021-12-21 ENCOUNTER — Inpatient Hospital Stay: Payer: Medicare Other | Attending: Hematology and Oncology

## 2021-12-21 ENCOUNTER — Inpatient Hospital Stay (HOSPITAL_BASED_OUTPATIENT_CLINIC_OR_DEPARTMENT_OTHER): Payer: Medicare Other | Admitting: Hematology and Oncology

## 2021-12-21 ENCOUNTER — Inpatient Hospital Stay: Payer: Medicare Other

## 2021-12-21 VITALS — HR 96

## 2021-12-21 DIAGNOSIS — M79604 Pain in right leg: Secondary | ICD-10-CM | POA: Diagnosis not present

## 2021-12-21 DIAGNOSIS — Z881 Allergy status to other antibiotic agents status: Secondary | ICD-10-CM | POA: Insufficient documentation

## 2021-12-21 DIAGNOSIS — Z8249 Family history of ischemic heart disease and other diseases of the circulatory system: Secondary | ICD-10-CM | POA: Diagnosis not present

## 2021-12-21 DIAGNOSIS — M25551 Pain in right hip: Secondary | ICD-10-CM | POA: Diagnosis not present

## 2021-12-21 DIAGNOSIS — Z170 Estrogen receptor positive status [ER+]: Secondary | ICD-10-CM | POA: Insufficient documentation

## 2021-12-21 DIAGNOSIS — C50411 Malignant neoplasm of upper-outer quadrant of right female breast: Secondary | ICD-10-CM | POA: Diagnosis not present

## 2021-12-21 DIAGNOSIS — R5383 Other fatigue: Secondary | ICD-10-CM | POA: Insufficient documentation

## 2021-12-21 DIAGNOSIS — K5900 Constipation, unspecified: Secondary | ICD-10-CM | POA: Diagnosis not present

## 2021-12-21 DIAGNOSIS — Z5181 Encounter for therapeutic drug level monitoring: Secondary | ICD-10-CM

## 2021-12-21 DIAGNOSIS — K219 Gastro-esophageal reflux disease without esophagitis: Secondary | ICD-10-CM | POA: Diagnosis not present

## 2021-12-21 DIAGNOSIS — Z5112 Encounter for antineoplastic immunotherapy: Secondary | ICD-10-CM | POA: Diagnosis not present

## 2021-12-21 DIAGNOSIS — R531 Weakness: Secondary | ICD-10-CM | POA: Diagnosis not present

## 2021-12-21 DIAGNOSIS — Z5111 Encounter for antineoplastic chemotherapy: Secondary | ICD-10-CM | POA: Insufficient documentation

## 2021-12-21 DIAGNOSIS — Z8349 Family history of other endocrine, nutritional and metabolic diseases: Secondary | ICD-10-CM | POA: Diagnosis not present

## 2021-12-21 DIAGNOSIS — Z95828 Presence of other vascular implants and grafts: Secondary | ICD-10-CM

## 2021-12-21 DIAGNOSIS — Z79899 Other long term (current) drug therapy: Secondary | ICD-10-CM | POA: Insufficient documentation

## 2021-12-21 DIAGNOSIS — Z9221 Personal history of antineoplastic chemotherapy: Secondary | ICD-10-CM

## 2021-12-21 LAB — CBC WITH DIFFERENTIAL (CANCER CENTER ONLY)
Abs Immature Granulocytes: 0.11 10*3/uL — ABNORMAL HIGH (ref 0.00–0.07)
Basophils Absolute: 0 10*3/uL (ref 0.0–0.1)
Basophils Relative: 0 %
Eosinophils Absolute: 0 10*3/uL (ref 0.0–0.5)
Eosinophils Relative: 0 %
HCT: 29 % — ABNORMAL LOW (ref 36.0–46.0)
Hemoglobin: 9.5 g/dL — ABNORMAL LOW (ref 12.0–15.0)
Immature Granulocytes: 1 %
Lymphocytes Relative: 10 %
Lymphs Abs: 1.2 10*3/uL (ref 0.7–4.0)
MCH: 27.1 pg (ref 26.0–34.0)
MCHC: 32.8 g/dL (ref 30.0–36.0)
MCV: 82.9 fL (ref 80.0–100.0)
Monocytes Absolute: 0.2 10*3/uL (ref 0.1–1.0)
Monocytes Relative: 1 %
Neutro Abs: 10.3 10*3/uL — ABNORMAL HIGH (ref 1.7–7.7)
Neutrophils Relative %: 88 %
Platelet Count: 336 10*3/uL (ref 150–400)
RBC: 3.5 MIL/uL — ABNORMAL LOW (ref 3.87–5.11)
RDW: 17.3 % — ABNORMAL HIGH (ref 11.5–15.5)
WBC Count: 11.8 10*3/uL — ABNORMAL HIGH (ref 4.0–10.5)
nRBC: 0 % (ref 0.0–0.2)

## 2021-12-21 LAB — CMP (CANCER CENTER ONLY)
ALT: 28 U/L (ref 0–44)
AST: 17 U/L (ref 15–41)
Albumin: 4.1 g/dL (ref 3.5–5.0)
Alkaline Phosphatase: 70 U/L (ref 38–126)
Anion gap: 8 (ref 5–15)
BUN: 30 mg/dL — ABNORMAL HIGH (ref 8–23)
CO2: 24 mmol/L (ref 22–32)
Calcium: 9.3 mg/dL (ref 8.9–10.3)
Chloride: 104 mmol/L (ref 98–111)
Creatinine: 0.81 mg/dL (ref 0.44–1.00)
GFR, Estimated: >60 mL/min (ref >60)
Glucose, Bld: 180 mg/dL — ABNORMAL HIGH (ref 70–99)
Potassium: 3.8 mmol/L (ref 3.5–5.1)
Sodium: 136 mmol/L (ref 135–145)
Total Bilirubin: 0.3 mg/dL (ref 0.3–1.2)
Total Protein: 6.6 g/dL (ref 6.5–8.1)

## 2021-12-21 MED ORDER — DIPHENHYDRAMINE HCL 25 MG PO CAPS
50.0000 mg | ORAL_CAPSULE | Freq: Once | ORAL | Status: AC
  Administered 2021-12-21: 50 mg via ORAL
  Filled 2021-12-21: qty 2

## 2021-12-21 MED ORDER — SODIUM CHLORIDE 0.9 % IV SOLN
50.0000 mg/m2 | Freq: Once | INTRAVENOUS | Status: AC
  Administered 2021-12-21: 80 mg via INTRAVENOUS
  Filled 2021-12-21: qty 8

## 2021-12-21 MED ORDER — SODIUM CHLORIDE 0.9 % IV SOLN
150.0000 mg | Freq: Once | INTRAVENOUS | Status: AC
  Administered 2021-12-21: 150 mg via INTRAVENOUS
  Filled 2021-12-21: qty 150

## 2021-12-21 MED ORDER — SODIUM CHLORIDE 0.9 % IV SOLN
10.0000 mg | Freq: Once | INTRAVENOUS | Status: AC
  Administered 2021-12-21: 10 mg via INTRAVENOUS
  Filled 2021-12-21: qty 10

## 2021-12-21 MED ORDER — TRASTUZUMAB-DKST CHEMO 150 MG IV SOLR
6.0000 mg/kg | Freq: Once | INTRAVENOUS | Status: AC
  Administered 2021-12-21: 378 mg via INTRAVENOUS
  Filled 2021-12-21: qty 18

## 2021-12-21 MED ORDER — SODIUM CHLORIDE 0.9 % IV SOLN: Freq: Once | INTRAVENOUS | Status: AC

## 2021-12-21 MED ORDER — SODIUM CHLORIDE 0.9 % IV SOLN
284.0000 mg | Freq: Once | INTRAVENOUS | Status: AC
  Administered 2021-12-21: 280 mg via INTRAVENOUS
  Filled 2021-12-21: qty 28

## 2021-12-21 MED ORDER — SODIUM CHLORIDE 0.9% FLUSH
10.0000 mL | INTRAVENOUS | Status: DC | PRN
  Administered 2021-12-21: 10 mL

## 2021-12-21 MED ORDER — HEPARIN SOD (PORK) LOCK FLUSH 100 UNIT/ML IV SOLN
500.0000 [IU] | Freq: Once | INTRAVENOUS | Status: AC | PRN
  Administered 2021-12-21: 500 [IU]

## 2021-12-21 MED ORDER — SODIUM CHLORIDE 0.9% FLUSH
10.0000 mL | Freq: Once | INTRAVENOUS | Status: AC
  Administered 2021-12-21: 10 mL

## 2021-12-21 MED ORDER — ACETAMINOPHEN 325 MG PO TABS
650.0000 mg | ORAL_TABLET | Freq: Once | ORAL | Status: AC
  Administered 2021-12-21: 650 mg via ORAL
  Filled 2021-12-21: qty 2

## 2021-12-21 MED ORDER — PALONOSETRON HCL INJECTION 0.25 MG/5ML
0.2500 mg | Freq: Once | INTRAVENOUS | Status: AC
  Administered 2021-12-21: 0.25 mg via INTRAVENOUS
  Filled 2021-12-21: qty 5

## 2021-12-21 NOTE — Progress Notes (Signed)
[facility] PROGRESS NOTE  Patient Care Team: Rhonda Pillar, MD as PCP - General (Family Medicine) Rhonda Klein, MD as Consulting Physician (General Surgery) Rhonda Pike, MD as Consulting Physician (Hematology and Oncology) Rhonda Rudd, MD as Consulting Physician (Radiation Oncology) Rhonda Kaufmann, RN as Oncology Nurse Navigator Rhonda Germany, RN as Oncology Nurse Navigator  CHIEF COMPLAINTS/PURPOSE OF CONSULTATION:  Breast cancer  SUMMARY OF ONCOLOGIC HISTORY: Oncology History  Malignant neoplasm of upper-outer quadrant of right breast in female, estrogen receptor positive ([place])  08/12/2021 Mammogram   Mammogram showed possible mass in the right breast.  Diagnostic mammogram showed 2 highly suspicious upper outer right breast masses measuring 1.5 cm in the 9:30 position and 1.4 cm at the 10 o'clock position.  These masses or distortions mammographically spanning a distance of up to 3.5 cm.  No abnormal appearing right axillary lymph nodes.   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  Prognostic showed ER 99% positive strong staining PR 10% positive moderate staining, HER2 positive 3+ and Ki-67 of 30%.   09/08/2021 Initial Diagnosis   Malignant neoplasm of upper-outer quadrant of right breast in female, estrogen receptor positive ([place])   09/15/2021 Pathology Results   She had right breast lumpectomy which showed invasive ductal carcinoma, 3.6 cm in maximal extent involving inferior and medial margins, DCIS approaching to less than 0.1 cm of closest margin no metastatic carcinoma identified in 4 out of 4 lymph nodes.  She is scheduled for repeat surgery given positive margins.  Prior prognostic showed ER 99% positive strong staining intensity PR 10% positive moderate staining intensity, HER2 positive.   09/23/2021 Genetic Testing   Negative hereditary cancer genetic testing: no pathogenic variants  detected in [facility] CustomNext-cancer +RNAinsight Panel.  Report date is September 23, 2021.   The CustomNext-Cancer+RNAinsight panel offered by [facility] includes sequencing and rearrangement analysis for the following 47 genes:  APC, ATM, AXIN2, BARD1, BMPR1A, BRCA1, BRCA2, BRIP1, CDH1, CDK4, CDKN2A, CHEK2, DICER1, EPCAM, GREM1, HOXB13, MEN1, MLH1, MSH2, MSH3, MSH6, MUTYH, NBN, NF1, NF2, NTHL1, PALB2, PMS2, POLD1, POLE, PTEN, RAD51C, RAD51D, RECQL, RET, SDHA, SDHAF2, SDHB, SDHC, SDHD, SMAD4, SMARCA4, STK11, TP53, TSC1, TSC2, and VHL.  RNA data is routinely analyzed for use in variant interpretation for all genes.   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 DCIS 0.25 mg from true/new margin.   11/10/2021 - 12/02/2021 Chemotherapy   Patient is on Treatment Plan : BREAST Docetaxel + Carboplatin + Trastuzumab ([place]) q21d / Trastuzumab q21d     11/10/2021 - 12/03/2021 Chemotherapy   Patient is on Treatment Plan : BREAST  Docetaxel + Carboplatin + Trastuzumab + Pertuzumab  ([facility]) q21d      11/10/2021 -  Chemotherapy   Patient is on Treatment Plan : BREAST Docetaxel + Carboplatin + Trastuzumab ([place]) q21d / Trastuzumab q21d      INTERVAL HISTORY:  Rhonda Richardson returns for a follow up visit after receiving Cycle 3 of carboplatin/docetaxel and trastuzumab on 11/30/2021.  She is doing well, right leg pain and weakness has gotten better. She is planning to do PT. No other neurological complaints. She has been taking prenatal vitamins ( MVI with iron) and got severely constipated. She has tried fiber but this didn't relieve it, She took it for one week and stopped it. She  said she would rather be fatigued than constipated. She had some nasal stuffiness, and green discharge which has resolved. Rest of the pertinent 10 point ROS reviewed and negative.  MEDICAL HISTORY:   Past Medical History:  Diagnosis Date   Breast cancer ([place])    Hypertension     SURGICAL HISTORY: Past Surgical History:  Procedure Laterality Date   BREAST LUMPECTOMY WITH RADIOACTIVE SEED AND SENTINEL LYMPH NODE BIOPSY Right 09/15/2021   Procedure: RIGHT BREAST SEED BRACKETED LUMPECTOMY AND SENTINEL NODE BIOPSY;  Surgeon: Rhonda Klein, MD;  Location: [facility];  Service: General;  Laterality: Right;   cataract surgery Bilateral    DILATION AND CURETTAGE OF UTERUS  1970   EXCISION OF BREAST LESION Right 10/15/2021   Procedure: ASPIRATION RIGHT AXILLARY SEROMA;  Surgeon: Rhonda Klein, MD;  Location: [facility];  Service: General;  Laterality: Right;   eyelid tendon repair Bilateral    PORTACATH PLACEMENT N/A 09/15/2021   Procedure: PORT PLACEMENT;  Surgeon: Rhonda Klein, MD;  Location: [facility];  Service: General;  Laterality: N/A;   RE-EXCISION OF BREAST LUMPECTOMY Right 10/15/2021   Procedure: RE-EXCISION LUMPECTOMY RIGHT BREAST;  Surgeon: Rhonda Klein, MD;  Location: [facility];  Service: General;  Laterality: Right;    SOCIAL HISTORY: Social History   Socioeconomic History   Marital status: Married    Spouse name: Not on file   Number of children: Not on file   Years of education: Not on file   Highest education level: Not on file  Occupational History   Not on file  Tobacco Use   Smoking status: Never   Smokeless tobacco: Not on file  Vaping Use   Vaping Use: Never used  Substance and Sexual Activity   Alcohol use: Yes    Comment: 3- 4 drinks   Drug use: Never   Sexual activity: Not Currently  Other Topics Concern   Not on file  Social History Narrative   Not on file   Social Determinants of Health   Financial Resource Strain: Low Risk  (09/09/2021)   Overall Financial Resource Strain (CARDIA)    Difficulty of Paying Living Expenses: Not hard at all  Food Insecurity: No Food Insecurity (09/09/2021)   Hunger Vital Sign    Worried About  Running Out of Food in the Last Year: Never true    [place] in the Last Year: Never true  Transportation Needs: No Transportation Needs (09/09/2021)   PRAPARE - Hydrologist (Medical): No    Lack of Transportation (Non-Medical): No  Physical Activity: Not on file  Stress: Not on file  Social Connections: Not on file  Intimate Partner Violence: Not on file    FAMILY HISTORY: Family History  Problem Relation Age of Onset   Heart disease Father 37   Thyroid cancer Other 51       niece's daughter    ALLERGIES:  is allergic to macrodantin [nitrofurantoin].  MEDICATIONS:  Current Outpatient Medications  Medication Sig Dispense Refill   acetaminophen (TYLENOL) 325 MG tablet Take 325 mg by mouth every 6 (six) hours as needed for moderate pain.     benazepril (LOTENSIN) 20 MG tablet Take 20 mg by mouth daily.     cholecalciferol (VITAMIN D3) 25 MCG (1000 UNIT) tablet Take 1,000 Units by mouth daily.     famotidine (PEPCID) 20 MG tablet Take 20 mg by mouth daily as needed for heartburn or indigestion.  hydrochlorothiazide (MICROZIDE) 12.5 MG capsule Take 12.5 mg by mouth daily.     ibuprofen (ADVIL) 200 MG tablet Take 200 mg by mouth every 6 (six) hours as needed for moderate pain.     Magnesium 250 MG TABS Take 250 mg by mouth daily.     Red Yeast Rice 600 MG CAPS Take 600 mg by mouth daily.     traMADol (ULTRAM) 50 MG tablet Take by mouth every 6 (six) hours as needed.     Vitamins-Lipotropics (MULTI-VITAMIN HP/MINERALS PO) Take 1 tablet by mouth daily.     No current facility-administered medications for this visit.    PHYSICAL EXAMINATION: ECOG PERFORMANCE STATUS: 0 - Asymptomatic  Vitals:   12/21/21 0831  BP: (!) 143/73  Pulse: (!) 106  Resp: 16  Temp: 97.9 F (36.6 C)  SpO2: 100%    Filed Weights   12/21/21 0831  Weight: 138 lb 11.2 oz (62.9 kg)   Physical Exam Constitutional:      Appearance: Normal appearance.   Cardiovascular:     Rate and Rhythm: Normal rate and regular rhythm.  Pulmonary:     Effort: Pulmonary effort is normal.     Breath sounds: Normal breath sounds.  Musculoskeletal:     Cervical back: Normal range of motion and neck supple. No rigidity.  Lymphadenopathy:     Cervical: No cervical adenopathy.  Skin:    General: Skin is warm and dry.  Neurological:     General: No focal deficit present.     Mental Status: She is alert.  Psychiatric:        Mood and Affect: Mood normal.     LABORATORY DATA:  I have reviewed the data as listed Lab Results  Component Value Date   WBC 11.5 (H) 11/30/2021   HGB 10.3 (L) 11/30/2021   HCT 30.2 (L) 11/30/2021   MCV 78.0 (L) 11/30/2021   PLT 395 11/30/2021   Lab Results  Component Value Date   NA 132 (L) 11/30/2021   K 3.6 11/30/2021   CL 100 11/30/2021   CO2 23 11/30/2021    RADIOGRAPHIC STUDIES: I have personally reviewed the radiological reports and agreed with the findings in the report.  ASSESSMENT AND PLAN:  KENIYAH GELINAS is a 81 y.o. female who presents for a follow up for right breast cancer.   #Malignant neoplasm of upper-outer quadrant of right breast in female, estrogen receptor positive  --ER/PR positive, HER2 amplified.  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 Media margin showed focal high-grade ductal carcinoma in situ, solid type with necrosis, negative for invasive carcinoma. Inferior and posterior margin negative for carcinoma.  --Given large tumor, triple positive, recommend dose modified TCH for 6 cycles followed by adjuvant herceptin vs taxol with herceptin. --Baseline echo from 09/14/2021 was satisfactory with EF 55-60%.  --Patient started Cycle 1, Day 1 of [place] on 11/10/2021. GCSF injection given on Day 3 of each cycle.  --She is now here before  C3,  -- No dose limiting toxicity -- PE unremarkable today. Ok to continue same dose.  #Acid Reflux:  --continue pepcid  #Right hip/leg pain: --This has gotten significantly better. She is working with PT. -- if she continues to get worse along with weakness, we will consider MRI brain -- Again advised her not to take too much Advil or tramadol.  She understands to take it sparingly.  #  Constipation: --Continue to take miralax and/or dulcolax.  All questions were answered. The patient knows to call the clinic with any problems, questions or concerns.   I have spent a total of 30 minutes minutes of face-to-face and non-face-to-face time, preparing to see the patient, performing a medically appropriate examination, counseling and educating the patient, documenting clinical information in the electronic health record, and care coordination.

## 2021-12-21 NOTE — Progress Notes (Signed)
Per Dr. Chryl Heck, okay to treat with echo from 09/14/21 55-60%

## 2021-12-21 NOTE — Telephone Encounter (Signed)
Per MD review ok to treat today per echo done in June 2023.  Pt will need ECHO prior to next treatment- order placed per above.

## 2021-12-21 NOTE — Patient Instructions (Signed)
[facility] ONCOLOGY  Discharge Instructions: Thank you for choosing [facility] to provide your oncology and hematology care.   If you have a lab appointment with the [facility], please go directly to the [facility] and check in at the registration area.   Wear comfortable clothing and clothing appropriate for easy access to any Portacath or PICC line.   We strive to give you quality time with your provider. You may need to reschedule your appointment if you arrive late (15 or more minutes).  Arriving late affects you and other patients whose appointments are after yours.  Also, if you miss three or more appointments without notifying the office, you may be dismissed from the clinic at the provider's discretion.      For prescription refill requests, have your pharmacy contact our office and allow 72 hours for refills to be completed.    Today you received the following chemotherapy and/or immunotherapy agents; Trastuzumab, Carbo, Taxel      To help prevent nausea and vomiting after your treatment, we encourage you to take your nausea medication as directed.  BELOW ARE SYMPTOMS THAT SHOULD BE REPORTED IMMEDIATELY: *FEVER GREATER THAN 100.4 F (38 C) OR HIGHER *CHILLS OR SWEATING *NAUSEA AND VOMITING THAT IS NOT CONTROLLED WITH YOUR NAUSEA MEDICATION *UNUSUAL SHORTNESS OF BREATH *UNUSUAL BRUISING OR BLEEDING *URINARY PROBLEMS (pain or burning when urinating, or frequent urination) *BOWEL PROBLEMS (unusual diarrhea, constipation, pain near the anus) TENDERNESS IN MOUTH AND THROAT WITH OR WITHOUT PRESENCE OF ULCERS (sore throat, sores in mouth, or a toothache) UNUSUAL RASH, SWELLING OR PAIN  UNUSUAL VAGINAL DISCHARGE OR ITCHING   Items with * indicate a potential emergency and should be followed up as soon as possible or go to the Emergency Department if any problems should occur.  Please show the CHEMOTHERAPY ALERT CARD or IMMUNOTHERAPY ALERT CARD  at check-in to the Emergency Department and triage nurse.  Should you have questions after your visit or need to cancel or reschedule your appointment, please contact [facility]  Dept: [phone]  and follow the prompts.  Office hours are 8:00 a.m. to 4:30 p.m. Monday - Friday. Please note that voicemails left after 4:00 p.m. may not be returned until the following business day.  We are closed weekends and major holidays. You have access to a nurse at all times for urgent questions. Please call the main number to the clinic Dept: [phone] and follow the prompts.   For any non-urgent questions, you may also contact your provider using MyChart. We now offer e-Visits for anyone 56 and older to request care online for non-urgent symptoms. For details visit [email or web address].   Also download the MyChart app! Go to the app store, search "MyChart", open the app, select [facility], and log in with your MyChart username and password.  Masks are optional in the cancer centers. If you would like for your care team to wear a mask while they are taking care of you, please let them know. You may have one support [name] who is at least 79 years old accompany you for your appointments.

## 2021-12-23 ENCOUNTER — Inpatient Hospital Stay: Payer: Medicare Other

## 2021-12-23 VITALS — BP 142/79 | HR 77 | Temp 98.4°F | Resp 16

## 2021-12-23 DIAGNOSIS — M79604 Pain in right leg: Secondary | ICD-10-CM | POA: Diagnosis not present

## 2021-12-23 DIAGNOSIS — C50411 Malignant neoplasm of upper-outer quadrant of right female breast: Secondary | ICD-10-CM

## 2021-12-23 DIAGNOSIS — Z170 Estrogen receptor positive status [ER+]: Secondary | ICD-10-CM | POA: Diagnosis not present

## 2021-12-23 DIAGNOSIS — Z5112 Encounter for antineoplastic immunotherapy: Secondary | ICD-10-CM | POA: Diagnosis not present

## 2021-12-23 DIAGNOSIS — Z5111 Encounter for antineoplastic chemotherapy: Secondary | ICD-10-CM | POA: Diagnosis not present

## 2021-12-23 DIAGNOSIS — M25551 Pain in right hip: Secondary | ICD-10-CM | POA: Diagnosis not present

## 2021-12-23 MED ORDER — PEGFILGRASTIM-CBQV 6 MG/0.6ML ~~LOC~~ SOSY
6.0000 mg | PREFILLED_SYRINGE | Freq: Once | SUBCUTANEOUS | Status: AC
  Administered 2021-12-23: 6 mg via SUBCUTANEOUS
  Filled 2021-12-23: qty 0.6

## 2021-12-23 NOTE — Patient Instructions (Signed)

## 2021-12-24 ENCOUNTER — Ambulatory Visit: Payer: Medicare Other

## 2021-12-24 DIAGNOSIS — R269 Unspecified abnormalities of gait and mobility: Secondary | ICD-10-CM | POA: Diagnosis not present

## 2021-12-24 DIAGNOSIS — M6281 Muscle weakness (generalized): Secondary | ICD-10-CM | POA: Diagnosis not present

## 2021-12-24 DIAGNOSIS — R2689 Other abnormalities of gait and mobility: Secondary | ICD-10-CM

## 2021-12-24 DIAGNOSIS — Z170 Estrogen receptor positive status [ER+]: Secondary | ICD-10-CM | POA: Diagnosis not present

## 2021-12-24 DIAGNOSIS — Z9181 History of falling: Secondary | ICD-10-CM

## 2021-12-24 DIAGNOSIS — M6289 Other specified disorders of muscle: Secondary | ICD-10-CM | POA: Diagnosis not present

## 2021-12-24 DIAGNOSIS — Z9221 Personal history of antineoplastic chemotherapy: Secondary | ICD-10-CM | POA: Diagnosis not present

## 2021-12-24 DIAGNOSIS — C50411 Malignant neoplasm of upper-outer quadrant of right female breast: Secondary | ICD-10-CM | POA: Diagnosis not present

## 2021-12-24 NOTE — Therapy (Signed)
OUTPATIENT PHYSICAL THERAPY PROGRESS NOTE   Patient Name: Rhonda Richardson MRN: [id number] DOB:1940/04/27, 81 y.o., female Today's Date: 12/24/2021   PT End of Session - 12/24/21 1104     Visit Number 3    Date for PT Re-Evaluation 03/10/22    Authorization Type Medicare    Authorization - Visit Number 3    Progress Note Due on Visit 10    PT Start Time 1105    PT Stop Time 1152    PT Time Calculation (min) 47 min    Activity Tolerance Patient tolerated treatment well    Behavior During Therapy WFL for tasks assessed/performed             Past Medical History:  Diagnosis Date   Breast cancer ([facility])    Hypertension    Past Surgical History:  Procedure Laterality Date   BREAST LUMPECTOMY WITH RADIOACTIVE SEED AND SENTINEL LYMPH NODE BIOPSY Right 09/15/2021   Procedure: RIGHT BREAST SEED BRACKETED LUMPECTOMY AND SENTINEL NODE BIOPSY;  Surgeon: [name], MD;  Location: [facility];  Service: General;  Laterality: Right;   cataract surgery Bilateral    DILATION AND CURETTAGE OF UTERUS  1970   EXCISION OF BREAST LESION Right 10/15/2021   Procedure: ASPIRATION RIGHT AXILLARY SEROMA;  Surgeon: [name], MD;  Location: [facility];  Service: General;  Laterality: Right;   eyelid tendon repair Bilateral    PORTACATH PLACEMENT N/A 09/15/2021   Procedure: PORT PLACEMENT;  Surgeon: [name], MD;  Location: [facility];  Service: General;  Laterality: N/A;   RE-EXCISION OF BREAST LUMPECTOMY Right 10/15/2021   Procedure: RE-EXCISION LUMPECTOMY RIGHT BREAST;  Surgeon: [name], MD;  Location: [facility];  Service: General;  Laterality: Right;   Patient Active Problem List   Diagnosis Date Noted   Port-A-Cath in place 11/18/2021   Genetic testing 09/28/2021   Malignant neoplasm of upper-outer quadrant of right breast in female, estrogen receptor positive ([place]) 09/08/2021    PCP: Kelton Pillar, MD  REFERRING PROVIDER: Benay Pike, MD  REFERRING  DIAG: M62.89 (ICD-10-CM) - Pelvic floor dysfunction  THERAPY DIAG:  Muscle weakness (generalized)  Other abnormalities of gait and mobility  History of falling  Rationale for Evaluation and Treatment Rehabilitation  ONSET DATE: 09/15/2021  SUBJECTIVE:                                                                                                                                                                                           SUBJECTIVE STATEMENT: Patient states she is a little better.     PAIN:  Are you having pain? Yes 5/10  PRECAUTIONS: Other: Breast cancer  WEIGHT BEARING RESTRICTIONS No  FALLS:  Has patient fallen in last 6 months? Yes on 11/13/2021 she fell after the chemotherapy treatment due to sciatica.  LIVING ENVIRONMENT: Lives with: lives with their spouse  OCCUPATION: part time  PLOF: Independent  PATIENT GOALS pelvic floor education  PERTINENT HISTORY:  Patient was diagnosed on 08/12/2021 with right grade 2 invasive ductal carcinoma breast cancer. She had a right lumpectomy and sentinel node biopsy (0/4 nodes positive) on 09/15/2021 followed by a re-excision on 10/15/2021. It is triple positive with a Ki67 of 30%. Presently having chemotherapy.   BOWEL MOVEMENT Pain with bowel movement: No  URINATION Pain with urination: No Fully empty bladder: No  INTERCOURSE Pain with intercourse:  none and uses lubricant  PREGNANCY Vaginal deliveries 3 Tearing No C-section deliveries 0 Currently pregnant No     OBJECTIVE:   DIAGNOSTIC FINDINGS:  none   COGNITION:  Overall cognitive status: Within functional limits for tasks assessed     SENSATION:  Light touch: Appears intact on left, decreased on lateral and medial right lower leg  Proprioception: Appears intact                POSTURE: No Significant postural limitations Sit to stand 5 times 15 seconds.  TUG 11 sec Stand on right leg 3 sec Stand on left leg 15 sec with no issues Tandem  stance with left leg in front 30 sec Tandem stance with right leg in front  30 sec with increased body sway  PELVIC ALIGNMENT:  LUMBARAROM/PROM  A/PROM A/PROM  eval  Extension Decreased by 25%    LOWER EXTREMITY UTM:LYYTKPTWS hip ROM is full  LOWER EXTREMITY MMT:  MMT Right eval Left eval  Hip flexion 4/5 5/5  Hip extension 4/5 4/5  Hip abduction 3+/5 3+/5  Hip external rotation 4/5 4/5  Knee flexion 5/5 5/5  Knee extension 4/5 5/5  Ankle dorsiflexion 4/5 5/5  Ankle plantarflexion 2/5 3/5  Ankle inversion 5/5 5/5  Ankle eversion 5/5 5/5    PALPATION:   General  no tenderness                      TODAY'S TREATMENT   9/14: NuStep x 5 min level 4 Leg Press 60 lbs x 20 , 30 lbs singles both Lengthy discussion regarding possibility of disc issue vs solely sciatic nerve: educated on anatomy of the spine and possible disc herniation or stenosis causing the leg weakness.  Will monitor.    Supine piriformis stretch 30 sec 3x  Supine neural floss 5x right/left Sit to stand mat table 10x no UE assist with cues for symmetrical weight bearing, added x 10 in tandem, right foot in back Squat to table x 10 without UE use: needed to add balance pad due to weakness. Seated red band clams 15x  9/7: Seated piriformis stretch 30 sec 3x  Seated neural floss 5x right/left Seated hip flexion/abduction up and over low cone on right 15x Sit to stand mat table 10x no UE assist with cues for symmetrical weight bearing Seated red band figure 8 around feet knee extension 15x right Seated red band clams 15x 6 inch step ups 10x with bil UE railing use Therapeutic activity:  sit to stand, standing, walking, ascending/descending curbs and steps    EVAL Date: 12/16/2021 HEP established-see below    PATIENT EDUCATION:  Education details: education on vaginal moisturizers and lubricants. Handed patient samples of each.  Person  educated: Patient Education method: Geographical information systems officer Education comprehension: verbalized understanding   HOME EXERCISE PROGRAM: Access Code: [email or web address] URL: [email or web address] Date: 12/17/2021 Prepared by: Ruben Im  Exercises - Seated Hip External Rotation Stretch  - 1 x daily - 7 x weekly - 1 sets - 3 reps - 30 hold - Seated Slump Nerve Glide  - 1 x daily - 7 x weekly - 1 sets - 5 reps - Seated March  - 1 x daily - 7 x weekly - 1 sets - 10 reps - Sit to Stand  - 1 x daily - 7 x weekly - 1 sets - 10 reps - Seated Hip Abduction with Resistance  - 1 x daily - 7 x weekly - 1 sets - 10 reps - Forward Step Up with Counter Support  - 1 x daily - 7 x weekly - 1 sets - 10 reps Given red band for home  ASSESSMENT:  CLINICAL IMPRESSION: Patient is progressing appropriately but continues to experience instability in the knee at times.  Overall, she seems to feel a lot more stable but during leg press today, her leg weakness was quite apparent.  She was able to complete all reps, however.  She is very well motivated and should continue to do well.  She would benefit from continued skilled PT for right LE strengthening and monitoring for mechanical knee issues vs radiculopathy.     OBJECTIVE IMPAIRMENTS Abnormal gait, decreased activity tolerance, decreased balance, decreased coordination, decreased endurance, difficulty walking, and decreased strength.   ACTIVITY LIMITATIONS lifting, bending, squatting, stairs, transfers, and locomotion level  PARTICIPATION LIMITATIONS: cleaning, laundry, shopping, community activity, and occupation  PERSONAL FACTORS Age, Fitness, Past/current experiences, and 1-2 comorbidities: breast cancer and having chemotherapy  are also affecting patient's functional outcome.   REHAB POTENTIAL: Excellent  CLINICAL DECISION MAKING: Evolving/moderate complexity  EVALUATION COMPLEXITY: Moderate   GOALS: Goals reviewed with patient? Yes  SHORT TERM GOALS: Target date: 01/13/2022  Patient  is independent with initial HEP for balance and strength.  Baseline: Goal status: INITIAL  2.  Patient reports her endurance and steadiness on her right leg has improved >/= 25% the 2 weeks prior to her chemotherapy treatments.  Baseline:  Goal status: INITIAL  3.  Patient educated on vaginal moisturizers and lubricants to promote good vaginal health.  Baseline:  Goal status: MET 12/16/2021  LONG TERM GOALS: Target date: 03/10/2022   Patient independent with advanced HEP for balance and strength to reduce the chance of falls.  Baseline:  Goal status: INITIAL  2.  Sit to stand 5x is </= 12.6 seconds to reduce the chance of her falling.  Baseline:  Goal status: INITIAL  3.  Patient able to stand on right leg for 15 seconds so she is able to go up and down steps with step over step pattern.  Baseline:  Goal status: INITIAL  4.  Tandem stance with right leg for 30 seconds with </= 50% less body swaying due to improved strength and balance.  Baseline:  Goal status: INITIAL  5.  Patient is able to squat and stand up with >/= 50% greater ease due to increased in right lower extremity strength.  Baseline:  Goal status: INITIAL   PLAN: PT FREQUENCY: 2x/week  PT DURATION: 12 weeks  PLANNED INTERVENTIONS: Therapeutic exercises, Therapeutic activity, Neuromuscular re-education, Balance training, Gait training, Patient/Family education, Self Care, and Stair training  PLAN FOR NEXT SESSION: review and progress HEP as needed;  work on right lower  extremity strength, balance exercises, program will focus on balance strength and endurance while she is getting chemotherapy   Joseantonio Dittmar B. Jeni Duling, PT 12/24/21 5:32 PM  Phone: [phone] Fax: [phone]

## 2021-12-29 ENCOUNTER — Ambulatory Visit: Payer: Medicare Other | Admitting: Physical Therapy

## 2021-12-29 DIAGNOSIS — M6289 Other specified disorders of muscle: Secondary | ICD-10-CM | POA: Diagnosis not present

## 2021-12-29 DIAGNOSIS — Z170 Estrogen receptor positive status [ER+]: Secondary | ICD-10-CM | POA: Diagnosis not present

## 2021-12-29 DIAGNOSIS — C50411 Malignant neoplasm of upper-outer quadrant of right female breast: Secondary | ICD-10-CM | POA: Diagnosis not present

## 2021-12-29 DIAGNOSIS — R2689 Other abnormalities of gait and mobility: Secondary | ICD-10-CM

## 2021-12-29 DIAGNOSIS — Z9221 Personal history of antineoplastic chemotherapy: Secondary | ICD-10-CM | POA: Diagnosis not present

## 2021-12-29 DIAGNOSIS — M6281 Muscle weakness (generalized): Secondary | ICD-10-CM | POA: Diagnosis not present

## 2021-12-29 DIAGNOSIS — R269 Unspecified abnormalities of gait and mobility: Secondary | ICD-10-CM | POA: Diagnosis not present

## 2021-12-29 DIAGNOSIS — Z9181 History of falling: Secondary | ICD-10-CM

## 2021-12-29 NOTE — Therapy (Signed)
OUTPATIENT PHYSICAL THERAPY PROGRESS NOTE   Patient Name: Rhonda Richardson MRN: [id number] DOB:03-24-1941, 81 y.o., female Today's Date: 12/29/2021   PT End of Session - 12/29/21 1533     Visit Number 4    Date for PT Re-Evaluation 03/10/22    Authorization - Number of Visits 10    Progress Note Due on Visit 10    PT Start Time 5102    PT Stop Time 1610    PT Time Calculation (min) 39 min    Activity Tolerance Patient tolerated treatment well             Past Medical History:  Diagnosis Date   Breast cancer ([facility])    Hypertension    Past Surgical History:  Procedure Laterality Date   BREAST LUMPECTOMY WITH RADIOACTIVE SEED AND SENTINEL LYMPH NODE BIOPSY Right 09/15/2021   Procedure: RIGHT BREAST SEED BRACKETED LUMPECTOMY AND SENTINEL NODE BIOPSY;  Surgeon: [name], MD;  Location: [facility];  Service: General;  Laterality: Right;   cataract surgery Bilateral    DILATION AND CURETTAGE OF UTERUS  1970   EXCISION OF BREAST LESION Right 10/15/2021   Procedure: ASPIRATION RIGHT AXILLARY SEROMA;  Surgeon: [name], MD;  Location: [facility];  Service: General;  Laterality: Right;   eyelid tendon repair Bilateral    PORTACATH PLACEMENT N/A 09/15/2021   Procedure: PORT PLACEMENT;  Surgeon: [name], MD;  Location: [facility];  Service: General;  Laterality: N/A;   RE-EXCISION OF BREAST LUMPECTOMY Right 10/15/2021   Procedure: RE-EXCISION LUMPECTOMY RIGHT BREAST;  Surgeon: [name], MD;  Location: [facility];  Service: General;  Laterality: Right;   Patient Active Problem List   Diagnosis Date Noted   Port-A-Cath in place 11/18/2021   Genetic testing 09/28/2021   Malignant neoplasm of upper-outer quadrant of right breast in female, estrogen receptor positive ([employer]) 09/08/2021    PCP: Kelton Pillar, MD  REFERRING PROVIDER: Benay Pike, MD  REFERRING DIAG: M62.89 (ICD-10-CM) - Pelvic floor dysfunction  THERAPY DIAG:  Muscle weakness  (generalized)  Other abnormalities of gait and mobility  History of falling  Rationale for Evaluation and Treatment Rehabilitation  ONSET DATE: 09/15/2021  SUBJECTIVE:                                                                                                                                                                                           SUBJECTIVE STATEMENT: It's a chemo week.  Running out of steam.  My blood count is low, my hemoglobin is 9.5.    Low energy. Some knee pain but none now.    PAIN:  Are you having pain? 0/10  touching lower leg feels different   PRECAUTIONS: Other: Breast cancer  WEIGHT BEARING RESTRICTIONS No  FALLS:  Has patient fallen in last 6 months? Yes on 11/13/2021 she fell after the chemotherapy treatment due to sciatica.  LIVING ENVIRONMENT: Lives with: lives with their spouse  OCCUPATION: part time  PLOF: Independent  PATIENT GOALS pelvic floor education  PERTINENT HISTORY:  Patient was diagnosed on 08/12/2021 with right grade 2 invasive ductal carcinoma breast cancer. She had a right lumpectomy and sentinel node biopsy (0/4 nodes positive) on 09/15/2021 followed by a re-excision on 10/15/2021. It is triple positive with a Ki67 of 30%. Presently having chemotherapy.   BOWEL MOVEMENT Pain with bowel movement: No  URINATION Pain with urination: No Fully empty bladder: No  INTERCOURSE Pain with intercourse:  none and uses lubricant  PREGNANCY Vaginal deliveries 3 Tearing No C-section deliveries 0 Currently pregnant No     OBJECTIVE:   DIAGNOSTIC FINDINGS:  none   COGNITION:  Overall cognitive status: Within functional limits for tasks assessed     SENSATION:  Light touch: Appears intact on left, decreased on lateral and medial right lower leg  Proprioception: Appears intact                POSTURE: No Significant postural limitations Sit to stand 5 times 15 seconds.  TUG 11 sec Stand on right leg 3 sec Stand on left  leg 15 sec with no issues Tandem stance with left leg in front 30 sec Tandem stance with right leg in front  30 sec with increased body sway  PELVIC ALIGNMENT:  LUMBARAROM/PROM  A/PROM A/PROM  eval  Extension Decreased by 25%    LOWER EXTREMITY JJK:[id number] hip ROM is full  LOWER EXTREMITY MMT:  MMT Right eval Left eval  Hip flexion 4/5 5/5  Hip extension 4/5 4/5  Hip abduction 3+/5 3+/5  Hip external rotation 4/5 4/5  Knee flexion 5/5 5/5  Knee extension 4/5 5/5  Ankle dorsiflexion 4/5 5/5  Ankle plantarflexion 2/5 3/5  Ankle inversion 5/5 5/5  Ankle eversion 5/5 5/5    PALPATION:   General  no tenderness                      TODAY'S TREATMENT  9/19: Nu-step L1 3 min Seated piriformis stretch 30 sec 3x  Seated neural floss 10x right/left Seated hip flexion/abduction up and over low cone on right 10x with 5# weight resting on knee  Seated ankle eversion with red band 10x Seated red band figure 8 around feet knee extension 10x right Seated red band clams 10x Standing heel raises with ball squeeze between ankles Standing floor slider hip abduction 5x right/left; hip extension 5x right/left 6 inch step ups 5x WB on right with 4 ways 5x (fatiguing) Neuromuscular re-education: muscle activation with verbal  cues to improve activation of quads, posterior tib muscles Therapeutic activity:  sit to stand, standing, walking, ascending/descending curbs and steps      9/14: NuStep x 5 min level 4 Leg Press 60 lbs x 20 , 30 lbs singles both Lengthy discussion regarding possibility of disc issue vs solely sciatic nerve: educated on anatomy of the spine and possible disc herniation or stenosis causing the leg weakness.  Will monitor.    Supine piriformis stretch 30 sec 3x  Supine neural floss 5x right/left Sit to stand mat table 10x no UE assist with cues for symmetrical weight bearing, added x 10  in tandem, right foot in back Squat to table x 10 without UE use: needed  to add balance pad due to weakness. Seated red band clams 15x  PATIENT EDUCATION:  Education details: education on vaginal moisturizers and lubricants. Handed patient samples of each.  Person educated: Patient Education method: Theatre stage manager Education comprehension: verbalized understanding   HOME EXERCISE PROGRAM: Access Code: [email or web address] URL: [email or web address] Date: 12/17/2021 Prepared by: Ruben Im  Exercises - Seated Hip External Rotation Stretch  - 1 x daily - 7 x weekly - 1 sets - 3 reps - 30 hold - Seated Slump Nerve Glide  - 1 x daily - 7 x weekly - 1 sets - 5 reps - Seated March  - 1 x daily - 7 x weekly - 1 sets - 10 reps - Sit to Stand  - 1 x daily - 7 x weekly - 1 sets - 10 reps - Seated Hip Abduction with Resistance  - 1 x daily - 7 x weekly - 1 sets - 10 reps - Forward Step Up with Counter Support  - 1 x daily - 7 x weekly - 1 sets - 10 reps Given red band for home  ASSESSMENT:  CLINICAL IMPRESSION: Therapist modified ex's for lower intensity and volume of exercises secondary to low hemoglobin post chemo.  She reports she "feels better" post treatment.  Therapist closely monitoring throughout session to avoid over-fatigue.    OBJECTIVE IMPAIRMENTS Abnormal gait, decreased activity tolerance, decreased balance, decreased coordination, decreased endurance, difficulty walking, and decreased strength.   ACTIVITY LIMITATIONS lifting, bending, squatting, stairs, transfers, and locomotion level  PARTICIPATION LIMITATIONS: cleaning, laundry, shopping, community activity, and occupation  PERSONAL FACTORS Age, Fitness, Past/current experiences, and 1-2 comorbidities: breast cancer and having chemotherapy  are also affecting patient's functional outcome.   REHAB POTENTIAL: Excellent  CLINICAL DECISION MAKING: Evolving/moderate complexity  EVALUATION COMPLEXITY: Moderate   GOALS: Goals reviewed with patient? Yes  SHORT TERM GOALS: Target  date: 01/13/2022  Patient is independent with initial HEP for balance and strength.  Baseline: Goal status: INITIAL  2.  Patient reports her endurance and steadiness on her right leg has improved >/= 25% the 2 weeks prior to her chemotherapy treatments.  Baseline:  Goal status: INITIAL  3.  Patient educated on vaginal moisturizers and lubricants to promote good vaginal health.  Baseline:  Goal status: MET 12/16/2021  LONG TERM GOALS: Target date: 03/10/2022   Patient independent with advanced HEP for balance and strength to reduce the chance of falls.  Baseline:  Goal status: INITIAL  2.  Sit to stand 5x is </= 12.6 seconds to reduce the chance of her falling.  Baseline:  Goal status: INITIAL  3.  Patient able to stand on right leg for 15 seconds so she is able to go up and down steps with step over step pattern.  Baseline:  Goal status: INITIAL  4.  Tandem stance with right leg for 30 seconds with </= 50% less body swaying due to improved strength and balance.  Baseline:  Goal status: INITIAL  5.  Patient is able to squat and stand up with >/= 50% greater ease due to increased in right lower extremity strength.  Baseline:  Goal status: INITIAL   PLAN: PT FREQUENCY: 2x/week  PT DURATION: 12 weeks  PLANNED INTERVENTIONS: Therapeutic exercises, Therapeutic activity, Neuromuscular re-education, Balance training, Gait training, Patient/Family education, Self Care, and Stair training  PLAN FOR NEXT SESSION:  follow up on energy levels (low hemoglobin); review  and progress HEP as needed;  work on right lower extremity strength, balance exercises, program will focus on balance strength and endurance while she is getting chemotherapy  Ruben Im, PT 12/29/21 5:30 PM Phone: [phone] Fax: [phone]

## 2021-12-30 ENCOUNTER — Ambulatory Visit (HOSPITAL_COMMUNITY)
Admission: RE | Admit: 2021-12-30 | Discharge: 2021-12-30 | Disposition: A | Discharge status: Home / Self Care | Payer: Medicare Other | Source: Ambulatory Visit | Attending: Hematology and Oncology | Admitting: Hematology and Oncology

## 2021-12-30 DIAGNOSIS — Z170 Estrogen receptor positive status [ER+]: Secondary | ICD-10-CM | POA: Diagnosis not present

## 2021-12-30 DIAGNOSIS — Z9221 Personal history of antineoplastic chemotherapy: Secondary | ICD-10-CM | POA: Diagnosis not present

## 2021-12-30 DIAGNOSIS — C50411 Malignant neoplasm of upper-outer quadrant of right female breast: Secondary | ICD-10-CM | POA: Diagnosis not present

## 2021-12-30 DIAGNOSIS — I10 Essential (primary) hypertension: Secondary | ICD-10-CM | POA: Insufficient documentation

## 2021-12-30 DIAGNOSIS — Z0189 Encounter for other specified special examinations: Secondary | ICD-10-CM

## 2021-12-30 DIAGNOSIS — Z5181 Encounter for therapeutic drug level monitoring: Secondary | ICD-10-CM | POA: Diagnosis not present

## 2021-12-30 LAB — ECHOCARDIOGRAM COMPLETE
Area-P 1/2: 3.61 cm2
S' Lateral: 2.3 cm

## 2021-12-31 ENCOUNTER — Ambulatory Visit: Payer: Medicare Other | Admitting: Physical Therapy

## 2021-12-31 DIAGNOSIS — Z170 Estrogen receptor positive status [ER+]: Secondary | ICD-10-CM | POA: Diagnosis not present

## 2021-12-31 DIAGNOSIS — M6289 Other specified disorders of muscle: Secondary | ICD-10-CM | POA: Diagnosis not present

## 2021-12-31 DIAGNOSIS — R269 Unspecified abnormalities of gait and mobility: Secondary | ICD-10-CM | POA: Diagnosis not present

## 2021-12-31 DIAGNOSIS — R2689 Other abnormalities of gait and mobility: Secondary | ICD-10-CM

## 2021-12-31 DIAGNOSIS — C50411 Malignant neoplasm of upper-outer quadrant of right female breast: Secondary | ICD-10-CM | POA: Diagnosis not present

## 2021-12-31 DIAGNOSIS — Z9221 Personal history of antineoplastic chemotherapy: Secondary | ICD-10-CM | POA: Diagnosis not present

## 2021-12-31 DIAGNOSIS — M6281 Muscle weakness (generalized): Secondary | ICD-10-CM

## 2021-12-31 NOTE — Therapy (Signed)
OUTPATIENT PHYSICAL THERAPY PROGRESS NOTE   Patient Name: Rhonda Richardson MRN: [id number] DOB:[birth year], 81 y.o., female Today's Date: 12/31/2021   PT End of Session - 12/31/21 1356     Visit Number 5    Date for PT Re-Evaluation 03/10/22    Authorization Type Medicare    Authorization - Number of Visits 10    Progress Note Due on Visit 10    PT Start Time 1400    PT Stop Time 1435  stomach not feeling well   PT Time Calculation (min) 40 min    Activity Tolerance Patient tolerated treatment well             Past Medical History:  Diagnosis Date   Breast cancer ([place])    Hypertension    Past Surgical History:  Procedure Laterality Date   BREAST LUMPECTOMY WITH RADIOACTIVE SEED AND SENTINEL LYMPH NODE BIOPSY Right 09/15/2021   Procedure: RIGHT BREAST SEED BRACKETED LUMPECTOMY AND SENTINEL NODE BIOPSY;  Surgeon: [name], MD;  Location: [place];  Service: General;  Laterality: Right;   cataract surgery Bilateral    DILATION AND CURETTAGE OF UTERUS  1970   EXCISION OF BREAST LESION Right 10/15/2021   Procedure: ASPIRATION RIGHT AXILLARY SEROMA;  Surgeon: [name], MD;  Location: [place];  Service: General;  Laterality: Right;   eyelid tendon repair Bilateral    PORTACATH PLACEMENT N/A 09/15/2021   Procedure: PORT PLACEMENT;  Surgeon: [name], MD;  Location: [place];  Service: General;  Laterality: N/A;   RE-EXCISION OF BREAST LUMPECTOMY Right 10/15/2021   Procedure: RE-EXCISION LUMPECTOMY RIGHT BREAST;  Surgeon: [name], MD;  Location: [place];  Service: General;  Laterality: Right;   Patient Active Problem List   Diagnosis Date Noted   Port-A-Cath in place 11/18/2021   Genetic testing 09/28/2021   Malignant neoplasm of upper-outer quadrant of right breast in female, estrogen receptor positive ([place]) 09/08/2021    PCP: Kelton Pillar, MD  REFERRING PROVIDER: Benay Pike, MD  REFERRING DIAG: M62.89 (ICD-10-CM) -  Pelvic floor dysfunction  THERAPY DIAG:  Muscle weakness (generalized)  Other abnormalities of gait and mobility  Rationale for Evaluation and Treatment Rehabilitation  ONSET DATE: 09/15/2021  SUBJECTIVE:                                                                                                                                                                                           SUBJECTIVE STATEMENT: Energy level about the same.  I had tingling in both knees when I woke up but it's gone now.   PAIN:  Are you having pain?  0/10  touching lower leg feels different   PRECAUTIONS: Other: Breast cancer  WEIGHT BEARING RESTRICTIONS No  FALLS:  Has patient fallen in last 6 months? Yes on 11/13/2021 she fell after the chemotherapy treatment due to sciatica.  LIVING ENVIRONMENT: Lives with: lives with their spouse  OCCUPATION: part time  PLOF: Independent  PATIENT GOALS pelvic floor education  PERTINENT HISTORY:  Patient was diagnosed on 08/12/2021 with right grade 2 invasive ductal carcinoma breast cancer. She had a right lumpectomy and sentinel node biopsy (0/4 nodes positive) on 09/15/2021 followed by a re-excision on 10/15/2021. It is triple positive with a Ki67 of 30%. Presently having chemotherapy.   BOWEL MOVEMENT Pain with bowel movement: No  URINATION Pain with urination: No Fully empty bladder: No  INTERCOURSE Pain with intercourse:  none and uses lubricant  PREGNANCY Vaginal deliveries 3 Tearing No C-section deliveries 0 Currently pregnant No     OBJECTIVE:   DIAGNOSTIC FINDINGS:  none   COGNITION:  Overall cognitive status: Within functional limits for tasks assessed     SENSATION:  Light touch: Appears intact on left, decreased on lateral and medial right lower leg  Proprioception: Appears intact                POSTURE: No Significant postural limitations Sit to stand 5 times 15 seconds.  TUG 11 sec Stand on right leg 3 sec Stand on left  leg 15 sec with no issues Tandem stance with left leg in front 30 sec Tandem stance with right leg in front  30 sec with increased body sway  PELVIC ALIGNMENT:  LUMBARAROM/PROM  A/PROM A/PROM  eval 9/21  Extension Decreased by 25% WFLs    LOWER EXTREMITY [id number] hip ROM is full  LOWER EXTREMITY MMT:  MMT Right eval Left eval 9/21  Hip flexion 4/5 5/5   Hip extension 4/5 4/5 4 right/left   Hip abduction 3+/5 3+/5 4- right/left   Hip external rotation 4/5 4/5   Knee flexion 5/5 5/5   Knee extension 4/5 5/5   Ankle dorsiflexion 4/5 5/5 4+ right   Ankle plantarflexion 2/5 3/5   Ankle inversion 5/5 5/5   Ankle eversion 5/5 5/5     PALPATION:   General  no tenderness                      TODAY'S TREATMENT  9/21: Nu-step L1 4 min Seated piriformis stretch 30 sec 3x  Seated neural floss 10x right/left Seated hip flexion/abduction up and over low cone on right and left 10x with 5# weight resting on knee  Seated green band plantarflexion 15x right/left  Seated ankle eversion with green band 15x right/left Seated ankle dorsiflexion green band 15x right/left Seated green band HS curls 10x right/left Sit to stand 2x 5 mat table Standing heel raises 3 ways: straight, toes in, toes out 5x each Standing floor slider: arcs each direction 2x 5  5x right/left  Therapeutic activity:  sit to stand, standing, walking, ascending/descending curbs and steps       9/19: Nu-step L1 3 min Seated piriformis stretch 30 sec 3x  Seated neural floss 10x right/left Seated hip flexion/abduction up and over low cone on right 10x with 5# weight resting on knee  Seated ankle eversion with red band 10x Seated red band figure 8 around feet knee extension 10x right Seated red band clams 10x Standing heel raises with ball squeeze between ankles Standing floor slider hip abduction 5x right/left; hip  extension 5x right/left 6 inch step ups 5x WB on right with 4 ways 5x  (fatiguing) Neuromuscular re-education: muscle activation with verbal  cues to improve activation of quads, posterior tib muscles Therapeutic activity:  sit to stand, standing, walking, ascending/descending curbs and steps      9/14: NuStep x 5 min level 4 Leg Press 60 lbs x 20 , 30 lbs singles both Lengthy discussion regarding possibility of disc issue vs solely sciatic nerve: educated on anatomy of the spine and possible disc herniation or stenosis causing the leg weakness.  Will monitor.    Supine piriformis stretch 30 sec 3x  Supine neural floss 5x right/left Sit to stand mat table 10x no UE assist with cues for symmetrical weight bearing, added x 10 in tandem, right foot in back Squat to table x 10 without UE use: needed to add balance pad due to weakness. Seated red band clams 15x  PATIENT EDUCATION:  Education details: education on vaginal moisturizers and lubricants. Handed patient samples of each.  Person educated: Patient Education method: Theatre stage manager Education comprehension: verbalized understanding   HOME EXERCISE PROGRAM: Access Code: [email or web address] URL: [email or web address] Date: 12/17/2021 Prepared by: Ruben Im  Exercises - Seated Hip External Rotation Stretch  - 1 x daily - 7 x weekly - 1 sets - 3 reps - 30 hold - Seated Slump Nerve Glide  - 1 x daily - 7 x weekly - 1 sets - 5 reps - Seated March  - 1 x daily - 7 x weekly - 1 sets - 10 reps - Sit to Stand  - 1 x daily - 7 x weekly - 1 sets - 10 reps - Seated Hip Abduction with Resistance  - 1 x daily - 7 x weekly - 1 sets - 10 reps - Forward Step Up with Counter Support  - 1 x daily - 7 x weekly - 1 sets - 10 reps Given red band for home  ASSESSMENT:  CLINICAL IMPRESSION: Therapist modifying treatment (lower intensity secondary to low hemoglobin levels).  She reports her energy is OK but during session she reports her stomach is a little upset.  Shortened session based on symptoms.    Will recheck functional retests next time but anticipate a good improvement.     OBJECTIVE IMPAIRMENTS Abnormal gait, decreased activity tolerance, decreased balance, decreased coordination, decreased endurance, difficulty walking, and decreased strength.   ACTIVITY LIMITATIONS lifting, bending, squatting, stairs, transfers, and locomotion level  PARTICIPATION LIMITATIONS: cleaning, laundry, shopping, community activity, and occupation  PERSONAL FACTORS Age, Fitness, Past/current experiences, and 1-2 comorbidities: breast cancer and having chemotherapy  are also affecting patient's functional outcome.   REHAB POTENTIAL: Excellent  CLINICAL DECISION MAKING: Evolving/moderate complexity  EVALUATION COMPLEXITY: Moderate   GOALS: Goals reviewed with patient? Yes  SHORT TERM GOALS: Target date: 01/13/2022  Patient is independent with initial HEP for balance and strength.  Baseline: Goal status: ongoing  2.  Patient reports her endurance and steadiness on her right leg has improved >/= 25% the 2 weeks prior to her chemotherapy treatments.  Baseline:  Goal status: ongoing  3.  Patient educated on vaginal moisturizers and lubricants to promote good vaginal health.  Baseline:  Goal status: MET 12/16/2021  LONG TERM GOALS: Target date: 03/10/2022   Patient independent with advanced HEP for balance and strength to reduce the chance of falls.  Baseline:  Goal status: INITIAL  2.  Sit to stand 5x is </= 12.6 seconds to reduce the chance of her  falling.  Baseline:  Goal status: INITIAL  3.  Patient able to stand on right leg for 15 seconds so she is able to go up and down steps with step over step pattern.  Baseline:  Goal status: INITIAL  4.  Tandem stance with right leg for 30 seconds with </= 50% less body swaying due to improved strength and balance.  Baseline:  Goal status: INITIAL  5.  Patient is able to squat and stand up with >/= 50% greater ease due to increased in  right lower extremity strength.  Baseline:  Goal status: INITIAL   PLAN: PT FREQUENCY: 2x/week  PT DURATION: 12 weeks  PLANNED INTERVENTIONS: Therapeutic exercises, Therapeutic activity, Neuromuscular re-education, Balance training, Gait training, Patient/Family education, Self Care, and Stair training  PLAN FOR NEXT SESSION:  recheck TUG; 5x sit to stand;  follow up on energy levels (low hemoglobin); review and progress HEP as needed;  work on right lower extremity strength, balance exercises, program will focus on balance strength and endurance while she is getting chemotherapy  Ruben Im, PT 12/31/21 2:43 PM Phone: [phone] Fax: [phone]

## 2022-01-04 ENCOUNTER — Ambulatory Visit: Payer: Medicare Other | Attending: General Surgery

## 2022-01-04 VITALS — Wt 136.0 lb

## 2022-01-04 DIAGNOSIS — Z483 Aftercare following surgery for neoplasm: Secondary | ICD-10-CM | POA: Insufficient documentation

## 2022-01-04 NOTE — Therapy (Signed)
  OUTPATIENT PHYSICAL THERAPY SOZO SCREENING NOTE   Patient Name: Rhonda Richardson MRN: [id number] DOB:03/23/1941, 81 y.o., female Today's Date: 01/04/2022  PCP: Collene Leyden, MD REFERRING PROVIDER: [name], MD   PT End of Session - 01/04/22 1001     Visit Number 5   # unchanged due to screen only   PT Start Time 0959    PT Stop Time 1004    PT Time Calculation (min) 5 min    Activity Tolerance Patient tolerated treatment well    Behavior During Therapy WFL for tasks assessed/performed             Past Medical History:  Diagnosis Date   Breast cancer ([place])    Hypertension    Past Surgical History:  Procedure Laterality Date   BREAST LUMPECTOMY WITH RADIOACTIVE SEED AND SENTINEL LYMPH NODE BIOPSY Right 09/15/2021   Procedure: RIGHT BREAST SEED BRACKETED LUMPECTOMY AND SENTINEL NODE BIOPSY;  Surgeon: [name], MD;  Location: [place];  Service: General;  Laterality: Right;   cataract surgery Bilateral    DILATION AND CURETTAGE OF UTERUS  1970   EXCISION OF BREAST LESION Right 10/15/2021   Procedure: ASPIRATION RIGHT AXILLARY SEROMA;  Surgeon: [name], MD;  Location: [place];  Service: General;  Laterality: Right;   eyelid tendon repair Bilateral    PORTACATH PLACEMENT N/A 09/15/2021   Procedure: PORT PLACEMENT;  Surgeon: [name], MD;  Location: [place] [facility] [place];  Service: General;  Laterality: N/A;   RE-EXCISION OF BREAST LUMPECTOMY Right 10/15/2021   Procedure: RE-EXCISION LUMPECTOMY RIGHT BREAST;  Surgeon: [name], MD;  Location: [facility];  Service: General;  Laterality: Right;   Patient Active Problem List   Diagnosis Date Noted   Port-A-Cath in place 11/18/2021   Genetic testing 09/28/2021   Malignant neoplasm of upper-outer quadrant of right breast in female, estrogen receptor positive ([place]) 09/08/2021    REFERRING DIAG: right breast cancer at risk for lymphedema  THERAPY DIAG: Aftercare following surgery for  neoplasm  PERTINENT HISTORY: Patient was diagnosed on 08/12/2021 with right grade 2 invasive ductal carcinoma breast cancer. She had a right lumpectomy and sentinel node biopsy (0/4 nodes positive) on 09/15/2021 followed by a re-excision on 10/15/2021. It is triple positive with a Ki67 of 30%.  PRECAUTIONS: right UE Lymphedema risk, None  SUBJECTIVE: Pt returns for her first 3 month L-Dex screen.   PAIN:  Are you having pain? No  SOZO SCREENING: Patient was assessed today using the SOZO machine to determine the lymphedema index score. This was compared to her baseline score. It was determined that she is within the recommended range when compared to her baseline and no further action is needed at this time. She will continue SOZO screenings. These are done every 3 months for 2 years post operatively followed by every 6 months for 2 years, and then annually.   L-DEX FLOWSHEETS - 01/04/22 1000       L-DEX LYMPHEDEMA SCREENING   Measurement Type Unilateral    L-DEX MEASUREMENT EXTREMITY Upper Extremity    POSITION  Standing    DOMINANT SIDE Right    At Risk Side Right    BASELINE SCORE (UNILATERAL) -1.9    L-DEX SCORE (UNILATERAL) -2.9    VALUE CHANGE (UNILAT) -1               Otelia Limes, PTA 01/04/2022, 10:04 AM

## 2022-01-05 ENCOUNTER — Other Ambulatory Visit: Payer: Self-pay

## 2022-01-05 ENCOUNTER — Ambulatory Visit: Payer: Medicare Other

## 2022-01-05 DIAGNOSIS — M6281 Muscle weakness (generalized): Secondary | ICD-10-CM

## 2022-01-05 DIAGNOSIS — Z483 Aftercare following surgery for neoplasm: Secondary | ICD-10-CM

## 2022-01-05 DIAGNOSIS — Z9221 Personal history of antineoplastic chemotherapy: Secondary | ICD-10-CM | POA: Diagnosis not present

## 2022-01-05 DIAGNOSIS — C50411 Malignant neoplasm of upper-outer quadrant of right female breast: Secondary | ICD-10-CM | POA: Diagnosis not present

## 2022-01-05 DIAGNOSIS — Z170 Estrogen receptor positive status [ER+]: Secondary | ICD-10-CM | POA: Diagnosis not present

## 2022-01-05 DIAGNOSIS — R269 Unspecified abnormalities of gait and mobility: Secondary | ICD-10-CM | POA: Diagnosis not present

## 2022-01-05 DIAGNOSIS — M6289 Other specified disorders of muscle: Secondary | ICD-10-CM | POA: Diagnosis not present

## 2022-01-05 DIAGNOSIS — R2689 Other abnormalities of gait and mobility: Secondary | ICD-10-CM

## 2022-01-05 DIAGNOSIS — Z9181 History of falling: Secondary | ICD-10-CM

## 2022-01-05 NOTE — Therapy (Signed)
OUTPATIENT PHYSICAL THERAPY PROGRESS NOTE   Patient Name: Rhonda Richardson MRN: [id number] DOB:[birth year], 81 y.o., female Today's Date: 01/05/2022   PT End of Session - 12/31/21 1356     Visit Number 5    Date for PT Re-Evaluation 03/10/22    Authorization Type Medicare    Authorization - Number of Visits 10    Progress Note Due on Visit 10    PT Start Time 1400    PT Stop Time 1435  stomach not feeling well   PT Time Calculation (min) 40 min    Activity Tolerance Patient tolerated treatment well             Past Medical History:  Diagnosis Date   Breast cancer ([place])    Hypertension    Past Surgical History:  Procedure Laterality Date   BREAST LUMPECTOMY WITH RADIOACTIVE SEED AND SENTINEL LYMPH NODE BIOPSY Right 09/15/2021   Procedure: RIGHT BREAST SEED BRACKETED LUMPECTOMY AND SENTINEL NODE BIOPSY;  Surgeon: [name], MD;  Location: [place];  Service: General;  Laterality: Right;   cataract surgery Bilateral    DILATION AND CURETTAGE OF UTERUS  1970   EXCISION OF BREAST LESION Right 10/15/2021   Procedure: ASPIRATION RIGHT AXILLARY SEROMA;  Surgeon: [name], MD;  Location: [place];  Service: General;  Laterality: Right;   eyelid tendon repair Bilateral    PORTACATH PLACEMENT N/A 09/15/2021   Procedure: PORT PLACEMENT;  Surgeon: [name], MD;  Location: [place];  Service: General;  Laterality: N/A;   RE-EXCISION OF BREAST LUMPECTOMY Right 10/15/2021   Procedure: RE-EXCISION LUMPECTOMY RIGHT BREAST;  Surgeon: [name], MD;  Location: [place] [facility];  Service: General;  Laterality: Right;   Patient Active Problem List   Diagnosis Date Noted   Port-A-Cath in place 11/18/2021   Genetic testing 09/28/2021   Malignant neoplasm of upper-outer quadrant of right breast in female, estrogen receptor positive ([place]) 09/08/2021    PCP: Kelton Pillar, MD  REFERRING PROVIDER: Benay Pike, MD  REFERRING DIAG: M62.89 (ICD-10-CM) -  Pelvic floor dysfunction  THERAPY DIAG:  Aftercare following surgery for neoplasm  Muscle weakness (generalized)  Other abnormalities of gait and mobility  History of falling  Rationale for Evaluation and Treatment Rehabilitation  ONSET DATE: 09/15/2021  SUBJECTIVE:                                                                                                                                                                                           SUBJECTIVE STATEMENT: No pain, just low energy.     PAIN:  Are you having pain? 0/10  touching  lower leg feels different   PRECAUTIONS: Other: Breast cancer  WEIGHT BEARING RESTRICTIONS No  FALLS:  Has patient fallen in last 6 months? Yes on 11/13/2021 she fell after the chemotherapy treatment due to sciatica.  LIVING ENVIRONMENT: Lives with: lives with their spouse  OCCUPATION: part time  PLOF: Independent  PATIENT GOALS pelvic floor education  PERTINENT HISTORY:  Patient was diagnosed on 08/12/2021 with right grade 2 invasive ductal carcinoma breast cancer. She had a right lumpectomy and sentinel node biopsy (0/4 nodes positive) on 09/15/2021 followed by a re-excision on 10/15/2021. It is triple positive with a Ki67 of 30%. Presently having chemotherapy.   BOWEL MOVEMENT Pain with bowel movement: No  URINATION Pain with urination: No Fully empty bladder: No  INTERCOURSE Pain with intercourse:  none and uses lubricant  PREGNANCY Vaginal deliveries 3 Tearing No C-section deliveries 0 Currently pregnant No     OBJECTIVE:   DIAGNOSTIC FINDINGS:  none   COGNITION:  Overall cognitive status: Within functional limits for tasks assessed     SENSATION:  Light touch: Appears intact on left, decreased on lateral and medial right lower leg  Proprioception: Appears intact                POSTURE: No Significant postural limitations Sit to stand 5 times 15 seconds.  TUG 11 sec Stand on right leg 3 sec Stand on left  leg 15 sec with no issues Tandem stance with left leg in front 30 sec Tandem stance with right leg in front  30 sec with increased body sway  PELVIC ALIGNMENT:  LUMBARAROM/PROM  A/PROM A/PROM  eval 9/21  Extension Decreased by 25% WFLs    LOWER EXTREMITY [id number] hip ROM is full  LOWER EXTREMITY MMT:  MMT Right eval Left eval 9/21  Hip flexion 4/5 5/5   Hip extension 4/5 4/5 4 right/left   Hip abduction 3+/5 3+/5 4- right/left   Hip external rotation 4/5 4/5   Knee flexion 5/5 5/5   Knee extension 4/5 5/5   Ankle dorsiflexion 4/5 5/5 4+ right   Ankle plantarflexion 2/5 3/5   Ankle inversion 5/5 5/5   Ankle eversion 5/5 5/5     PALPATION:   General  no tenderness                      TODAY'S TREATMENT  9/26: Nu-step L1 4 min Seated piriformis stretch 30 sec 2x  each side Seated neural floss 10x right/left Seated hip flexion/abduction up and over hurdle on right and left 10x with 5# weight resting on knee  Seated green band plantarflexion 20x right/left  Seated ankle eversion with green band 20x right/left Seated ankle dorsiflexion green band 20x right/left Seated green band HS curls 20x right/left Sit to stand 2x 10 mat table Standing heel raises 3 ways: straight, toes in, toes out 5x each Lateral band walks at back counter (green loop) x 4 laps  9/21: Nu-step L1 4 min Seated piriformis stretch 30 sec 3x  Seated neural floss 10x right/left Seated hip flexion/abduction up and over low cone on right and left 10x with 5# weight resting on knee  Seated green band plantarflexion 15x right/left  Seated ankle eversion with green band 15x right/left Seated ankle dorsiflexion green band 15x right/left Seated green band HS curls 10x right/left Sit to stand 2x 5 mat table Standing heel raises 3 ways: straight, toes in, toes out 5x each Standing floor slider: arcs each direction 2x 5  5x right/left Therapeutic activity:  sit to stand, standing, walking,  ascending/descending curbs and steps  9/19: Nu-step L1 3 min Seated piriformis stretch 30 sec 3x  Seated neural floss 10x right/left Seated hip flexion/abduction up and over low cone on right 10x with 5# weight resting on knee  Seated ankle eversion with red band 10x Seated red band figure 8 around feet knee extension 10x right Seated red band clams 10x Standing heel raises with ball squeeze between ankles Standing floor slider hip abduction 5x right/left; hip extension 5x right/left 6 inch step ups 5x WB on right with 4 ways 5x (fatiguing) Neuromuscular re-education: muscle activation with verbal  cues to improve activation of quads, posterior tib muscles Therapeutic activity:  sit to stand, standing, walking, ascending/descending curbs and steps   PATIENT EDUCATION:  Education details: education on vaginal moisturizers and lubricants. Handed patient samples of each.  Person educated: Patient Education method: Theatre stage manager Education comprehension: verbalized understanding   HOME EXERCISE PROGRAM: Access Code: [email or web address] URL: [email or web address] Date: 12/17/2021 Prepared by: Ruben Im  Exercises - Seated Hip External Rotation Stretch  - 1 x daily - 7 x weekly - 1 sets - 3 reps - 30 hold - Seated Slump Nerve Glide  - 1 x daily - 7 x weekly - 1 sets - 5 reps - Seated March  - 1 x daily - 7 x weekly - 1 sets - 10 reps - Sit to Stand  - 1 x daily - 7 x weekly - 1 sets - 10 reps - Seated Hip Abduction with Resistance  - 1 x daily - 7 x weekly - 1 sets - 10 reps - Forward Step Up with Counter Support  - 1 x daily - 7 x weekly - 1 sets - 10 reps Given red band for home  ASSESSMENT:  CLINICAL IMPRESSION: Amara did well today.  She was able to tolerate increase in resistance and reps.  Time constraint limited ability to re-test functional scores.  We will re-test next visit.  PT predicts significant improvement unless she has a chemo treatment before  then, which tends to deplete her endurance and strength.  She would benefit from continued skilled care for improving overall strength and endurance and to instruct in fall prevention.    OBJECTIVE IMPAIRMENTS Abnormal gait, decreased activity tolerance, decreased balance, decreased coordination, decreased endurance, difficulty walking, and decreased strength.   ACTIVITY LIMITATIONS lifting, bending, squatting, stairs, transfers, and locomotion level  PARTICIPATION LIMITATIONS: cleaning, laundry, shopping, community activity, and occupation  PERSONAL FACTORS Age, Fitness, Past/current experiences, and 1-2 comorbidities: breast cancer and having chemotherapy  are also affecting patient's functional outcome.   REHAB POTENTIAL: Excellent  CLINICAL DECISION MAKING: Evolving/moderate complexity  EVALUATION COMPLEXITY: Moderate   GOALS: Goals reviewed with patient? Yes  SHORT TERM GOALS: Target date: 01/13/2022  Patient is independent with initial HEP for balance and strength.  Baseline: Goal status: ongoing  2.  Patient reports her endurance and steadiness on her right leg has improved >/= 25% the 2 weeks prior to her chemotherapy treatments.  Baseline:  Goal status: ongoing  3.  Patient educated on vaginal moisturizers and lubricants to promote good vaginal health.  Baseline:  Goal status: MET 12/16/2021  LONG TERM GOALS: Target date: 03/10/2022   Patient independent with advanced HEP for balance and strength to reduce the chance of falls.  Baseline:  Goal status: INITIAL  2.  Sit to stand 5x is </= 12.6 seconds to reduce the chance of her  falling.  Baseline:  Goal status: INITIAL  3.  Patient able to stand on right leg for 15 seconds so she is able to go up and down steps with step over step pattern.  Baseline:  Goal status: INITIAL  4.  Tandem stance with right leg for 30 seconds with </= 50% less body swaying due to improved strength and balance.  Baseline:  Goal status:  INITIAL  5.  Patient is able to squat and stand up with >/= 50% greater ease due to increased in right lower extremity strength.  Baseline:  Goal status: INITIAL   PLAN: PT FREQUENCY: 2x/week  PT DURATION: 12 weeks  PLANNED INTERVENTIONS: Therapeutic exercises, Therapeutic activity, Neuromuscular re-education, Balance training, Gait training, Patient/Family education, Self Care, and Stair training  PLAN FOR NEXT SESSION:  recheck TUG; 5x sit to stand;  follow up on energy levels (low hemoglobin); review and progress HEP as needed;  work on right lower extremity strength, balance exercises, program will focus on balance strength and endurance while she is getting chemotherapy  Anderson Malta B. Ivelis Norgard, PT 01/05/22 1:36 PM  [facility] [address], [place] [address], [place] [address] Phone # [phone] Fax [phone]

## 2022-01-07 ENCOUNTER — Ambulatory Visit: Payer: Medicare Other

## 2022-01-07 DIAGNOSIS — M6289 Other specified disorders of muscle: Secondary | ICD-10-CM | POA: Diagnosis not present

## 2022-01-07 DIAGNOSIS — R2689 Other abnormalities of gait and mobility: Secondary | ICD-10-CM

## 2022-01-07 DIAGNOSIS — Z483 Aftercare following surgery for neoplasm: Secondary | ICD-10-CM

## 2022-01-07 DIAGNOSIS — M6281 Muscle weakness (generalized): Secondary | ICD-10-CM

## 2022-01-07 DIAGNOSIS — R293 Abnormal posture: Secondary | ICD-10-CM

## 2022-01-07 DIAGNOSIS — Z9221 Personal history of antineoplastic chemotherapy: Secondary | ICD-10-CM | POA: Diagnosis not present

## 2022-01-07 DIAGNOSIS — Z9181 History of falling: Secondary | ICD-10-CM

## 2022-01-07 DIAGNOSIS — C50411 Malignant neoplasm of upper-outer quadrant of right female breast: Secondary | ICD-10-CM | POA: Diagnosis not present

## 2022-01-07 DIAGNOSIS — Z170 Estrogen receptor positive status [ER+]: Secondary | ICD-10-CM | POA: Diagnosis not present

## 2022-01-07 DIAGNOSIS — R269 Unspecified abnormalities of gait and mobility: Secondary | ICD-10-CM | POA: Diagnosis not present

## 2022-01-07 NOTE — Therapy (Signed)
OUTPATIENT PHYSICAL THERAPY PROGRESS NOTE   Patient Name: Rhonda Richardson MRN: [id number] DOB:27-Feb-1941, 81 y.o., female Today's Date: 01/07/2022   PT End of Session - 12/31/21 1356     Visit Number 5    Date for PT Re-Evaluation 03/10/22    Authorization Type Medicare    Authorization - Number of Visits 10    Progress Note Due on Visit 10    PT Start Time 1400    PT Stop Time 1435  stomach not feeling well   PT Time Calculation (min) 40 min    Activity Tolerance Patient tolerated treatment well             Past Medical History:  Diagnosis Date   Breast cancer ([place])    Hypertension    Past Surgical History:  Procedure Laterality Date   BREAST LUMPECTOMY WITH RADIOACTIVE SEED AND SENTINEL LYMPH NODE BIOPSY Right 09/15/2021   Procedure: RIGHT BREAST SEED BRACKETED LUMPECTOMY AND SENTINEL NODE BIOPSY;  Surgeon: [name], MD;  Location: [place];  Service: General;  Laterality: Right;   cataract surgery Bilateral    DILATION AND CURETTAGE OF UTERUS  1970   EXCISION OF BREAST LESION Right 10/15/2021   Procedure: ASPIRATION RIGHT AXILLARY SEROMA;  Surgeon: [name], MD;  Location: [place];  Service: General;  Laterality: Right;   eyelid tendon repair Bilateral    PORTACATH PLACEMENT N/A 09/15/2021   Procedure: PORT PLACEMENT;  Surgeon: [name], MD;  Location: [place];  Service: General;  Laterality: N/A;   RE-EXCISION OF BREAST LUMPECTOMY Right 10/15/2021   Procedure: RE-EXCISION LUMPECTOMY RIGHT BREAST;  Surgeon: [name], MD;  Location: [place];  Service: General;  Laterality: Right;   Patient Active Problem List   Diagnosis Date Noted   Port-A-Cath in place 11/18/2021   Genetic testing 09/28/2021   Malignant neoplasm of upper-outer quadrant of right breast in female, estrogen receptor positive ([place]) 09/08/2021    PCP: Kelton Pillar, MD  REFERRING PROVIDER: Benay Pike, MD  REFERRING DIAG: M62.89 (ICD-10-CM) -  Pelvic floor dysfunction  THERAPY DIAG:  Aftercare following surgery for neoplasm  Muscle weakness (generalized)  Other abnormalities of gait and mobility  History of falling  Malignant neoplasm of upper-outer quadrant of right breast in female, estrogen receptor positive ([place])  Abnormal posture  Rationale for Evaluation and Treatment Rehabilitation  ONSET DATE: 09/15/2021  SUBJECTIVE:                                                                                                                                                                                           SUBJECTIVE STATEMENT:  Patient states she was a little sore but her chemo effect lasted a little longer this time.  "I feel pretty good today"  PAIN:  Are you having pain? 0/10  touching lower leg feels different   PRECAUTIONS: Other: Breast cancer  WEIGHT BEARING RESTRICTIONS No  FALLS:  Has patient fallen in last 6 months? Yes on 11/13/2021 she fell after the chemotherapy treatment due to sciatica.  LIVING ENVIRONMENT: Lives with: lives with their spouse  OCCUPATION: part time  PLOF: Independent  PATIENT GOALS pelvic floor education  PERTINENT HISTORY:  Patient was diagnosed on 08/12/2021 with right grade 2 invasive ductal carcinoma breast cancer. She had a right lumpectomy and sentinel node biopsy (0/4 nodes positive) on 09/15/2021 followed by a re-excision on 10/15/2021. It is triple positive with a Ki67 of 30%. Presently having chemotherapy.   BOWEL MOVEMENT Pain with bowel movement: No  URINATION Pain with urination: No Fully empty bladder: No  INTERCOURSE Pain with intercourse:  none and uses lubricant  PREGNANCY Vaginal deliveries 3 Tearing No C-section deliveries 0 Currently pregnant No     OBJECTIVE:   DIAGNOSTIC FINDINGS:  none   COGNITION:  Overall cognitive status: Within functional limits for tasks assessed     SENSATION:  Light touch: Appears intact on left, decreased on  lateral and medial right lower leg  Proprioception: Appears intact                POSTURE: No Significant postural limitations Sit to stand 5 times 15 seconds.  TUG 11 sec Stand on right leg 3 sec Stand on left leg 15 sec with no issues Tandem stance with left leg in front 30 sec Tandem stance with right leg in front  30 sec with increased body sway  PELVIC ALIGNMENT:  LUMBARAROM/PROM  A/PROM A/PROM  eval 9/21  Extension Decreased by 25% WFLs    LOWER EXTREMITY JQZ:ESPQZRAQT hip ROM is full  LOWER EXTREMITY MMT:  MMT Right eval Left eval 9/21  Hip flexion 4/5 5/5   Hip extension 4/5 4/5 4 right/left   Hip abduction 3+/5 3+/5 4- right/left   Hip external rotation 4/5 4/5   Knee flexion 5/5 5/5   Knee extension 4/5 5/5   Ankle dorsiflexion 4/5 5/5 4+ right   Ankle plantarflexion 2/5 3/5   Ankle inversion 5/5 5/5   Ankle eversion 5/5 5/5     PALPATION:   General  no tenderness                      TODAY'S TREATMENT  9/28: Nu-step L5 5 min Seated piriformis stretch 30 sec 2x  each side Seated neural floss 20x right/left Seated hip flexion/abduction up and over hurdle on right and left 2 x 10 with 5# weight resting on knee  Seated green band plantarflexion 20x right/left  Seated ankle eversion with green band 20x right/left Seated ankle dorsiflexion green band 20x right/left Seated green band HS curls 20x right/left Sit to stand 2x 10 mat table Standing heel raises 3 ways: straight, toes in, toes out 5x each Lateral band walks at back counter (green loop) x 4 laps Up and over 4 inch step laterally x 20 (1 is 1) Seated LAQ with 5 # 2 x 10  9/26: Nu-step L1 4 min Seated piriformis stretch 30 sec 2x  each side Seated neural floss 10x right/left Seated hip flexion/abduction up and over hurdle on right and left 10x with 5# weight resting on knee  Seated  green band plantarflexion 20x right/left  Seated ankle eversion with green band 20x right/left Seated ankle  dorsiflexion green band 20x right/left Seated green band HS curls 20x right/left Sit to stand 2x 10 mat table Standing heel raises 3 ways: straight, toes in, toes out 5x each Lateral band walks at back counter (green loop) x 4 laps  9/21: Nu-step L1 4 min Seated piriformis stretch 30 sec 3x  Seated neural floss 10x right/left Seated hip flexion/abduction up and over low cone on right and left 10x with 5# weight resting on knee  Seated green band plantarflexion 15x right/left  Seated ankle eversion with green band 15x right/left Seated ankle dorsiflexion green band 15x right/left Seated green band HS curls 10x right/left Sit to stand 2x 5 mat table Standing heel raises 3 ways: straight, toes in, toes out 5x each Standing floor slider: arcs each direction 2x 5  5x right/left Therapeutic activity:  sit to stand, standing, walking, ascending/descending curbs and steps  PATIENT EDUCATION:  Education details: education on vaginal moisturizers and lubricants. Handed patient samples of each.  Person educated: Patient Education method: Theatre stage manager Education comprehension: verbalized understanding   HOME EXERCISE PROGRAM: Access Code: [email or web address] URL: [email or web address] Date: 12/17/2021 Prepared by: Ruben Im  Exercises - Seated Hip External Rotation Stretch  - 1 x daily - 7 x weekly - 1 sets - 3 reps - 30 hold - Seated Slump Nerve Glide  - 1 x daily - 7 x weekly - 1 sets - 5 reps - Seated March  - 1 x daily - 7 x weekly - 1 sets - 10 reps - Sit to Stand  - 1 x daily - 7 x weekly - 1 sets - 10 reps - Seated Hip Abduction with Resistance  - 1 x daily - 7 x weekly - 1 sets - 10 reps - Forward Step Up with Counter Support  - 1 x daily - 7 x weekly - 1 sets - 10 reps Given red band for home  ASSESSMENT:  CLINICAL IMPRESSION: Dequita did well today.  She was able to tolerate increase in resistance and reps.  We also added step up and overs with minimal  fatigue.  She would benefit from continued skilled care for improving overall strength and endurance and to instruct in fall prevention.    OBJECTIVE IMPAIRMENTS Abnormal gait, decreased activity tolerance, decreased balance, decreased coordination, decreased endurance, difficulty walking, and decreased strength.   ACTIVITY LIMITATIONS lifting, bending, squatting, stairs, transfers, and locomotion level  PARTICIPATION LIMITATIONS: cleaning, laundry, shopping, community activity, and occupation  PERSONAL FACTORS Age, Fitness, Past/current experiences, and 1-2 comorbidities: breast cancer and having chemotherapy  are also affecting patient's functional outcome.   REHAB POTENTIAL: Excellent  CLINICAL DECISION MAKING: Evolving/moderate complexity  EVALUATION COMPLEXITY: Moderate   GOALS: Goals reviewed with patient? Yes  SHORT TERM GOALS: Target date: 01/13/2022  Patient is independent with initial HEP for balance and strength.  Baseline: Goal status: ongoing  2.  Patient reports her endurance and steadiness on her right leg has improved >/= 25% the 2 weeks prior to her chemotherapy treatments.  Baseline:  Goal status: ongoing  3.  Patient educated on vaginal moisturizers and lubricants to promote good vaginal health.  Baseline:  Goal status: MET 12/16/2021  LONG TERM GOALS: Target date: 03/10/2022   Patient independent with advanced HEP for balance and strength to reduce the chance of falls.  Baseline:  Goal status: INITIAL  2.  Sit to stand 5x is </=  12.6 seconds to reduce the chance of her falling.  Baseline:  Goal status: INITIAL  3.  Patient able to stand on right leg for 15 seconds so she is able to go up and down steps with step over step pattern.  Baseline:  Goal status: INITIAL  4.  Tandem stance with right leg for 30 seconds with </= 50% less body swaying due to improved strength and balance.  Baseline:  Goal status: INITIAL  5.  Patient is able to squat and  stand up with >/= 50% greater ease due to increased in right lower extremity strength.  Baseline:  Goal status: INITIAL   PLAN: PT FREQUENCY: 2x/week  PT DURATION: 12 weeks  PLANNED INTERVENTIONS: Therapeutic exercises, Therapeutic activity, Neuromuscular re-education, Balance training, Gait training, Patient/Family education, Self Care, and Stair training  PLAN FOR NEXT SESSION:  recheck TUG; 5x sit to stand;  follow up on energy levels (low hemoglobin); review and progress HEP as needed;  work on right lower extremity strength, balance exercises, program will focus on balance strength and endurance while she is getting chemotherapy  Anderson Malta B. Fields, PT 01/07/22 2:43 PM  [facility] [address] [place], [place] [address] Phone # [phone] Fax [phone]

## 2022-01-08 MED FILL — Dexamethasone Sodium Phosphate Inj 100 MG/10ML: INTRAMUSCULAR | Qty: 1 | Status: AC

## 2022-01-08 MED FILL — Fosaprepitant Dimeglumine For IV Infusion 150 MG (Base Eq): INTRAVENOUS | Qty: 5 | Status: AC

## 2022-01-11 ENCOUNTER — Encounter: Payer: Self-pay | Admitting: Hematology and Oncology

## 2022-01-11 ENCOUNTER — Inpatient Hospital Stay (HOSPITAL_BASED_OUTPATIENT_CLINIC_OR_DEPARTMENT_OTHER): Payer: Medicare Other | Admitting: Hematology and Oncology

## 2022-01-11 ENCOUNTER — Inpatient Hospital Stay: Payer: Medicare Other

## 2022-01-11 ENCOUNTER — Inpatient Hospital Stay: Payer: Medicare Other | Attending: Hematology and Oncology

## 2022-01-11 VITALS — HR 99

## 2022-01-11 VITALS — BP 145/62 | HR 110 | Temp 98.3°F | Wt 139.7 lb

## 2022-01-11 DIAGNOSIS — Z8249 Family history of ischemic heart disease and other diseases of the circulatory system: Secondary | ICD-10-CM | POA: Diagnosis not present

## 2022-01-11 DIAGNOSIS — K219 Gastro-esophageal reflux disease without esophagitis: Secondary | ICD-10-CM | POA: Diagnosis not present

## 2022-01-11 DIAGNOSIS — R5383 Other fatigue: Secondary | ICD-10-CM | POA: Diagnosis not present

## 2022-01-11 DIAGNOSIS — Z5189 Encounter for other specified aftercare: Secondary | ICD-10-CM | POA: Diagnosis not present

## 2022-01-11 DIAGNOSIS — Z170 Estrogen receptor positive status [ER+]: Secondary | ICD-10-CM

## 2022-01-11 DIAGNOSIS — Z8349 Family history of other endocrine, nutritional and metabolic diseases: Secondary | ICD-10-CM | POA: Insufficient documentation

## 2022-01-11 DIAGNOSIS — G629 Polyneuropathy, unspecified: Secondary | ICD-10-CM | POA: Insufficient documentation

## 2022-01-11 DIAGNOSIS — C50411 Malignant neoplasm of upper-outer quadrant of right female breast: Secondary | ICD-10-CM

## 2022-01-11 DIAGNOSIS — K5900 Constipation, unspecified: Secondary | ICD-10-CM | POA: Insufficient documentation

## 2022-01-11 DIAGNOSIS — Z881 Allergy status to other antibiotic agents status: Secondary | ICD-10-CM | POA: Diagnosis not present

## 2022-01-11 DIAGNOSIS — Z5112 Encounter for antineoplastic immunotherapy: Secondary | ICD-10-CM | POA: Insufficient documentation

## 2022-01-11 DIAGNOSIS — Z95828 Presence of other vascular implants and grafts: Secondary | ICD-10-CM

## 2022-01-11 DIAGNOSIS — Z5111 Encounter for antineoplastic chemotherapy: Secondary | ICD-10-CM | POA: Diagnosis not present

## 2022-01-11 DIAGNOSIS — R600 Localized edema: Secondary | ICD-10-CM | POA: Diagnosis not present

## 2022-01-11 DIAGNOSIS — Z79899 Other long term (current) drug therapy: Secondary | ICD-10-CM | POA: Diagnosis not present

## 2022-01-11 LAB — CBC WITH DIFFERENTIAL (CANCER CENTER ONLY)
Abs Immature Granulocytes: 0.43 10*3/uL — ABNORMAL HIGH (ref 0.00–0.07)
Basophils Absolute: 0 10*3/uL (ref 0.0–0.1)
Basophils Relative: 0 %
Eosinophils Absolute: 0 10*3/uL (ref 0.0–0.5)
Eosinophils Relative: 0 %
HCT: 27.2 % — ABNORMAL LOW (ref 36.0–46.0)
Hemoglobin: 9 g/dL — ABNORMAL LOW (ref 12.0–15.0)
Immature Granulocytes: 3 %
Lymphocytes Relative: 9 %
Lymphs Abs: 1.4 10*3/uL (ref 0.7–4.0)
MCH: 27.7 pg (ref 26.0–34.0)
MCHC: 33.1 g/dL (ref 30.0–36.0)
MCV: 83.7 fL (ref 80.0–100.0)
Monocytes Absolute: 0.1 10*3/uL (ref 0.1–1.0)
Monocytes Relative: 1 %
Neutro Abs: 13.2 10*3/uL — ABNORMAL HIGH (ref 1.7–7.7)
Neutrophils Relative %: 87 %
Platelet Count: 291 10*3/uL (ref 150–400)
RBC: 3.25 MIL/uL — ABNORMAL LOW (ref 3.87–5.11)
RDW: 18.2 % — ABNORMAL HIGH (ref 11.5–15.5)
WBC Count: 15.3 10*3/uL — ABNORMAL HIGH (ref 4.0–10.5)
nRBC: 0 % (ref 0.0–0.2)

## 2022-01-11 LAB — CMP (CANCER CENTER ONLY)
ALT: 20 U/L (ref 0–44)
AST: 15 U/L (ref 15–41)
Albumin: 4 g/dL (ref 3.5–5.0)
Alkaline Phosphatase: 79 U/L (ref 38–126)
Anion gap: 9 (ref 5–15)
BUN: 25 mg/dL — ABNORMAL HIGH (ref 8–23)
CO2: 24 mmol/L (ref 22–32)
Calcium: 9 mg/dL (ref 8.9–10.3)
Chloride: 104 mmol/L (ref 98–111)
Creatinine: 0.74 mg/dL (ref 0.44–1.00)
GFR, Estimated: >60 mL/min (ref >60)
Glucose, Bld: 178 mg/dL — ABNORMAL HIGH (ref 70–99)
Potassium: 3.9 mmol/L (ref 3.5–5.1)
Sodium: 137 mmol/L (ref 135–145)
Total Bilirubin: 0.3 mg/dL (ref 0.3–1.2)
Total Protein: 6.1 g/dL — ABNORMAL LOW (ref 6.5–8.1)

## 2022-01-11 MED ORDER — PALONOSETRON HCL INJECTION 0.25 MG/5ML
0.2500 mg | Freq: Once | INTRAVENOUS | Status: AC
  Administered 2022-01-11: 0.25 mg via INTRAVENOUS
  Filled 2022-01-11: qty 5

## 2022-01-11 MED ORDER — HEPARIN SOD (PORK) LOCK FLUSH 100 UNIT/ML IV SOLN: 500.0000 [IU] | Freq: Once | INTRAVENOUS | Status: DC

## 2022-01-11 MED ORDER — DIPHENHYDRAMINE HCL 25 MG PO CAPS
50.0000 mg | ORAL_CAPSULE | Freq: Once | ORAL | Status: AC
  Administered 2022-01-11: 50 mg via ORAL
  Filled 2022-01-11: qty 2

## 2022-01-11 MED ORDER — ALTEPLASE 2 MG IJ SOLR: 2.0000 mg | Freq: Once | INTRAMUSCULAR | Status: DC

## 2022-01-11 MED ORDER — HEPARIN SOD (PORK) LOCK FLUSH 100 UNIT/ML IV SOLN: 250.0000 [IU] | Freq: Once | INTRAVENOUS | Status: DC

## 2022-01-11 MED ORDER — ACETAMINOPHEN 325 MG PO TABS
650.0000 mg | ORAL_TABLET | Freq: Once | ORAL | Status: AC
  Administered 2022-01-11: 650 mg via ORAL
  Filled 2022-01-11: qty 2

## 2022-01-11 MED ORDER — SODIUM CHLORIDE 0.9% FLUSH
10.0000 mL | INTRAVENOUS | Status: DC | PRN
  Administered 2022-01-11: 10 mL

## 2022-01-11 MED ORDER — SODIUM CHLORIDE 0.9 % IV SOLN
284.0000 mg | Freq: Once | INTRAVENOUS | Status: AC
  Administered 2022-01-11: 280 mg via INTRAVENOUS
  Filled 2022-01-11: qty 28

## 2022-01-11 MED ORDER — SODIUM CHLORIDE 0.9 % IV SOLN
150.0000 mg | Freq: Once | INTRAVENOUS | Status: AC
  Administered 2022-01-11: 150 mg via INTRAVENOUS
  Filled 2022-01-11: qty 150

## 2022-01-11 MED ORDER — TRASTUZUMAB-DKST CHEMO 150 MG IV SOLR
6.0000 mg/kg | Freq: Once | INTRAVENOUS | Status: AC
  Administered 2022-01-11: 378 mg via INTRAVENOUS
  Filled 2022-01-11: qty 18

## 2022-01-11 MED ORDER — SODIUM CHLORIDE 0.9% FLUSH: 10.0000 mL | Freq: Once | INTRAVENOUS | Status: DC

## 2022-01-11 MED ORDER — HEPARIN SOD (PORK) LOCK FLUSH 100 UNIT/ML IV SOLN
500.0000 [IU] | Freq: Once | INTRAVENOUS | Status: AC | PRN
  Administered 2022-01-11: 500 [IU]

## 2022-01-11 MED ORDER — SODIUM CHLORIDE 0.9% FLUSH
10.0000 mL | Freq: Once | INTRAVENOUS | Status: AC
  Administered 2022-01-11: 10 mL

## 2022-01-11 MED ORDER — SODIUM CHLORIDE 0.9 % IV SOLN
10.0000 mg | Freq: Once | INTRAVENOUS | Status: AC
  Administered 2022-01-11: 10 mg via INTRAVENOUS
  Filled 2022-01-11: qty 10

## 2022-01-11 MED ORDER — SODIUM CHLORIDE 0.9 % IV SOLN
50.0000 mg/m2 | Freq: Once | INTRAVENOUS | Status: AC
  Administered 2022-01-11: 80 mg via INTRAVENOUS
  Filled 2022-01-11: qty 8

## 2022-01-11 MED ORDER — SODIUM CHLORIDE 0.9 % IV SOLN: Freq: Once | INTRAVENOUS | Status: AC

## 2022-01-11 NOTE — Progress Notes (Signed)
[facility] PROGRESS NOTE  Patient Care Team: Collene Leyden, MD as PCP - General (Family Medicine) [name], MD as Consulting Physician (General Surgery) Benay Pike, MD as Consulting Physician (Hematology and Oncology) Kyung Rudd, MD as Consulting Physician (Radiation Oncology) Mauro Kaufmann, RN as Oncology Nurse Navigator Rockwell Germany, RN as Oncology Nurse Navigator  CHIEF COMPLAINTS/PURPOSE OF CONSULTATION:  Breast cancer  SUMMARY OF ONCOLOGIC HISTORY: Oncology History  Malignant neoplasm of upper-outer quadrant of right breast in female, estrogen receptor positive ([place])  08/12/2021 Mammogram   Mammogram showed possible mass in the right breast.  Diagnostic mammogram showed 2 highly suspicious upper outer right breast masses measuring 1.5 cm in the 9:30 position and 1.4 cm at the 10 o'clock position.  These masses or distortions mammographically spanning a distance of up to 3.5 cm.  No abnormal appearing right axillary lymph nodes.   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  Prognostic showed ER 99% positive strong staining PR 10% positive moderate staining, HER2 positive 3+ and Ki-67 of 30%.   09/08/2021 Initial Diagnosis   Malignant neoplasm of upper-outer quadrant of right breast in female, estrogen receptor positive ([place])   09/15/2021 Pathology Results   She had right breast lumpectomy which showed invasive ductal carcinoma, 3.6 cm in maximal extent involving inferior and medial margins, DCIS approaching to less than 0.1 cm of closest margin no metastatic carcinoma identified in 4 out of 4 lymph nodes.  She is scheduled for repeat surgery given positive margins.  Prior prognostic showed ER 99% positive strong staining intensity PR 10% positive moderate staining intensity, HER2 positive.   09/23/2021 Genetic Testing   Negative hereditary cancer genetic testing: no pathogenic variants  detected in [facility] CustomNext-cancer +RNAinsight Panel.  Report date is September 23, 2021.   The CustomNext-Cancer+RNAinsight panel offered by [facility] includes sequencing and rearrangement analysis for the following 47 genes:  APC, ATM, AXIN2, BARD1, BMPR1A, BRCA1, BRCA2, BRIP1, CDH1, CDK4, CDKN2A, CHEK2, DICER1, EPCAM, GREM1, HOXB13, MEN1, MLH1, MSH2, MSH3, MSH6, MUTYH, NBN, NF1, NF2, NTHL1, PALB2, PMS2, POLD1, POLE, PTEN, RAD51C, RAD51D, RECQL, RET, SDHA, SDHAF2, SDHB, SDHC, SDHD, SMAD4, SMARCA4, STK11, TP53, TSC1, TSC2, and VHL.  RNA data is routinely analyzed for use in variant interpretation for all genes.   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 DCIS 0.25 mg from true/new margin.   11/10/2021 - 12/02/2021 Chemotherapy   Patient is on Treatment Plan : BREAST Docetaxel + Carboplatin + Trastuzumab ([facility]) q21d / Trastuzumab q21d     11/10/2021 - 12/03/2021 Chemotherapy   Patient is on Treatment Plan : BREAST  Docetaxel + Carboplatin + Trastuzumab + Pertuzumab  ([facility]) q21d      11/10/2021 -  Chemotherapy   Patient is on Treatment Plan : BREAST Docetaxel + Carboplatin + Trastuzumab ([facility]) q21d / Trastuzumab q21d      INTERVAL HISTORY:  Rhonda Richardson returns for a follow up visit after receiving Cycle 4 of carboplatin/docetaxel and trastuzumab on 11/30/2021.  Since last visit, she has noticed more fatigue. She has noted some numbness and tingling left foot. This is new. Right leg injury has been healing well. Dysguesia has been lingering for longer with each cycle. She also had to use more pepcid than last time given more heartburn. She has been using probiotics and  laxatives as needed for constipation. Rest of the pertinent 10 point ROS reviewed and negative  MEDICAL HISTORY:  Past Medical History:  Diagnosis Date   Breast cancer ([place])     Hypertension     SURGICAL HISTORY: Past Surgical History:  Procedure Laterality Date   BREAST LUMPECTOMY WITH RADIOACTIVE SEED AND SENTINEL LYMPH NODE BIOPSY Right 09/15/2021   Procedure: RIGHT BREAST SEED BRACKETED LUMPECTOMY AND SENTINEL NODE BIOPSY;  Surgeon: [name], MD;  Location: [facility];  Service: General;  Laterality: Right;   cataract surgery Bilateral    DILATION AND CURETTAGE OF UTERUS  1970   EXCISION OF BREAST LESION Right 10/15/2021   Procedure: ASPIRATION RIGHT AXILLARY SEROMA;  Surgeon: [name], MD;  Location: [facility];  Service: General;  Laterality: Right;   eyelid tendon repair Bilateral    PORTACATH PLACEMENT N/A 09/15/2021   Procedure: PORT PLACEMENT;  Surgeon: [name], MD;  Location: [facility];  Service: General;  Laterality: N/A;   RE-EXCISION OF BREAST LUMPECTOMY Right 10/15/2021   Procedure: RE-EXCISION LUMPECTOMY RIGHT BREAST;  Surgeon: [name], MD;  Location: [facility];  Service: General;  Laterality: Right;    SOCIAL HISTORY: Social History   Socioeconomic History   Marital status: Married    Spouse name: Not on file   Number of children: Not on file   Years of education: Not on file   Highest education level: Not on file  Occupational History   Not on file  Tobacco Use   Smoking status: Never   Smokeless tobacco: Not on file  Vaping Use   Vaping Use: Never used  Substance and Sexual Activity   Alcohol use: Yes    Comment: 3- 4 drinks   Drug use: Never   Sexual activity: Not Currently  Other Topics Concern   Not on file  Social History Narrative   Not on file   Social Determinants of Health   Financial Resource Strain: Low Risk  (09/09/2021)   Overall Financial Resource Strain (CARDIA)    Difficulty of Paying Living Expenses: Not hard at all  Food Insecurity: No Food Insecurity (09/09/2021)   Hunger Vital Sign    Worried About Running Out of Food in the Last Year: Never true    Ran Out of Food in  the Last Year: Never true  Transportation Needs: No Transportation Needs (09/09/2021)   PRAPARE - Hydrologist (Medical): No    Lack of Transportation (Non-Medical): No  Physical Activity: Not on file  Stress: Not on file  Social Connections: Not on file  Intimate Partner Violence: Not on file    FAMILY HISTORY: Family History  Problem Relation Age of Onset   Heart disease Father 50   Thyroid cancer Other 75       niece's daughter    ALLERGIES:  is allergic to macrodantin [nitrofurantoin].  MEDICATIONS:  Current Outpatient Medications  Medication Sig Dispense Refill   acetaminophen (TYLENOL) 325 MG tablet Take 325 mg by mouth every 6 (six) hours as needed for moderate pain.     benazepril (LOTENSIN) 20 MG tablet Take 20 mg by mouth daily.     cholecalciferol (VITAMIN D3) 25 MCG (1000 UNIT) tablet Take 1,000 Units by mouth daily.     famotidine (PEPCID) 20 MG tablet Take 20 mg by mouth daily as needed for heartburn or indigestion.     hydrochlorothiazide (MICROZIDE) 12.5 MG capsule Take 12.5 mg by mouth daily.  ibuprofen (ADVIL) 200 MG tablet Take 200 mg by mouth every 6 (six) hours as needed for moderate pain.     Magnesium 250 MG TABS Take 250 mg by mouth daily.     Red Yeast Rice 600 MG CAPS Take 600 mg by mouth daily.     traMADol (ULTRAM) 50 MG tablet Take by mouth every 6 (six) hours as needed.     Vitamins-Lipotropics (MULTI-VITAMIN HP/MINERALS PO) Take 1 tablet by mouth daily.     No current facility-administered medications for this visit.   Facility-Administered Medications Ordered in Other Visits  Medication Dose Route Frequency Provider Last Rate Last Admin   CARBOplatin (PARAPLATIN) 280 mg in sodium chloride 0.9 % 100 mL chemo infusion  280 mg Intravenous Once Tynika Luddy, MD       DOCEtaxel (TAXOTERE) 80 mg in sodium chloride 0.9 % 250 mL chemo infusion  50 mg/m2 (Treatment Plan Recorded) Intravenous Once Chamberlain Steinborn, Arletha Pili, MD        heparin lock flush 100 unit/mL  500 Units Intracatheter Once PRN Sahirah Rudell, MD       sodium chloride flush (NS) 0.9 % injection 10 mL  10 mL Intracatheter PRN Areyanna Figeroa, MD       trastuzumab-dkst (OGIVRI) 378 mg in sodium chloride 0.9 % 250 mL chemo infusion  6 mg/kg (Treatment Plan Recorded) Intravenous Once Kimmy Totten, Arletha Pili, MD        PHYSICAL EXAMINATION: ECOG PERFORMANCE STATUS: 0 - Asymptomatic  Vitals:   01/11/22 0935  BP: (!) 145/62  Pulse: (!) 110  Temp: 98.3 F (36.8 C)  SpO2: 99%    Filed Weights   01/11/22 0935  Weight: 139 lb 11.2 oz (63.4 kg)   Physical Exam Constitutional:      Appearance: Normal appearance.  Cardiovascular:     Rate and Rhythm: Normal rate and regular rhythm.  Pulmonary:     Effort: Pulmonary effort is normal.     Breath sounds: Normal breath sounds.  Musculoskeletal:     Cervical back: Normal range of motion and neck supple. No rigidity.  Lymphadenopathy:     Cervical: No cervical adenopathy.  Skin:    General: Skin is warm and dry.  Neurological:     General: No focal deficit present.     Mental Status: She is alert.  Psychiatric:        Mood and Affect: Mood normal.     LABORATORY DATA:  I have reviewed the data as listed Lab Results  Component Value Date   WBC 15.3 (H) 01/11/2022   HGB 9.0 (L) 01/11/2022   HCT 27.2 (L) 01/11/2022   MCV 83.7 01/11/2022   PLT 291 01/11/2022   Lab Results  Component Value Date   NA 137 01/11/2022   K 3.9 01/11/2022   CL 104 01/11/2022   CO2 24 01/11/2022    RADIOGRAPHIC STUDIES: I have personally reviewed the radiological reports and agreed with the findings in the report.  ASSESSMENT AND PLAN:  Rhonda Richardson is a 81 y.o. female who presents for a follow up for right breast cancer.   #Malignant neoplasm of upper-outer quadrant of right breast in female, estrogen receptor positive  --ER/PR positive, HER2 amplified.  --Underwent right breast lumpectomy on 09/15/2021  which showed invasive ductal carcinoma, 3.6 cm tumor, triple positive, positive margins, negative SLN involvement --Underwent margin excision on 10/15/2021. Media margin showed focal high-grade ductal carcinoma in situ, solid type with necrosis, negative for invasive carcinoma. Inferior and posterior margin negative  for carcinoma.  --Given large tumor, triple positive, recommend dose modified TCH for 6 cycles followed by adjuvant herceptin vs taxol with herceptin. --Baseline echo from 09/14/2021 was satisfactory with EF 55-60%.  --Patient started Cycle 1, Day 1 of Lipan on 11/10/2021. GCSF injection given on Day 3 of each cycle.  --She is now here before C4 -- No dose limiting toxicity -- PE unremarkable today. Ok to continue same dose.   #Acid Reflux:  --continue pepcid - will reduce dex to 4 mg a day before and for 2 days after chemotherapy  #Right hip/leg pain: --This has gotten significantly better. She is working with PT.  #Constipation: --Continue to take miralax and/or dulcolax as needed.  # Neuropathy, She doesn't feel its significant to dose reduce docetaxel. We have discussed that this could be permanent and needs to be closely monitored. She expressed understanding  # BLE edema, 1+, will continue to monitor. Encouraged elevation of legs while resting  All questions were answered. The patient knows to call the clinic with any problems, questions or concerns.   I have spent a total of 30 minutes minutes of face-to-face and non-face-to-face time, preparing to see the patient, performing a medically appropriate examination, counseling and educating the patient, documenting clinical information in the electronic health record, and care coordination.

## 2022-01-12 ENCOUNTER — Ambulatory Visit: Payer: Medicare Other

## 2022-01-13 ENCOUNTER — Other Ambulatory Visit: Payer: Self-pay

## 2022-01-13 ENCOUNTER — Inpatient Hospital Stay: Payer: Medicare Other

## 2022-01-13 ENCOUNTER — Telehealth: Payer: Self-pay | Admitting: *Deleted

## 2022-01-13 ENCOUNTER — Ambulatory Visit: Payer: Medicare Other

## 2022-01-13 VITALS — BP 151/77 | HR 87 | Temp 98.1°F | Resp 18

## 2022-01-13 DIAGNOSIS — Z5112 Encounter for antineoplastic immunotherapy: Secondary | ICD-10-CM | POA: Diagnosis not present

## 2022-01-13 DIAGNOSIS — C50411 Malignant neoplasm of upper-outer quadrant of right female breast: Secondary | ICD-10-CM | POA: Diagnosis not present

## 2022-01-13 DIAGNOSIS — Z170 Estrogen receptor positive status [ER+]: Secondary | ICD-10-CM

## 2022-01-13 DIAGNOSIS — Z5111 Encounter for antineoplastic chemotherapy: Secondary | ICD-10-CM | POA: Diagnosis not present

## 2022-01-13 DIAGNOSIS — Z5189 Encounter for other specified aftercare: Secondary | ICD-10-CM | POA: Diagnosis not present

## 2022-01-13 DIAGNOSIS — R5383 Other fatigue: Secondary | ICD-10-CM | POA: Diagnosis not present

## 2022-01-13 MED ORDER — PEGFILGRASTIM-CBQV 6 MG/0.6ML ~~LOC~~ SOSY
6.0000 mg | PREFILLED_SYRINGE | Freq: Once | SUBCUTANEOUS | Status: AC
  Administered 2022-01-13: 6 mg via SUBCUTANEOUS
  Filled 2022-01-13: qty 0.6

## 2022-01-13 NOTE — Patient Instructions (Signed)

## 2022-01-13 NOTE — Telephone Encounter (Signed)
This RN received VM from pt stating " new side effect since my treatment Monday ".  RN returned call- pt reports that with this cycle " I really increased my fluid intake which seemed to help a lot- but then last night I woke up and had an episode of diarrhea but then it resolved and I was able to go back to sleep"  She states this AM she had a small stool but it was not as watery.  She is eating some toast presently.  This RN discussed above as well as obtaining history that she is usually constipated " but maybe because of the all the fluid - not having that issue "  This RN recommended her to presently not take any anti diarrheal medication but continue to hydrate well and monitor.  No other issues - pt states overall she feels good.

## 2022-01-14 ENCOUNTER — Ambulatory Visit: Payer: Medicare Other

## 2022-01-14 ENCOUNTER — Ambulatory Visit: Payer: Medicare Other | Attending: Hematology and Oncology

## 2022-01-14 DIAGNOSIS — Z9181 History of falling: Secondary | ICD-10-CM | POA: Diagnosis not present

## 2022-01-14 DIAGNOSIS — R293 Abnormal posture: Secondary | ICD-10-CM | POA: Insufficient documentation

## 2022-01-14 DIAGNOSIS — M6281 Muscle weakness (generalized): Secondary | ICD-10-CM | POA: Diagnosis not present

## 2022-01-14 DIAGNOSIS — Z483 Aftercare following surgery for neoplasm: Secondary | ICD-10-CM | POA: Insufficient documentation

## 2022-01-14 DIAGNOSIS — R252 Cramp and spasm: Secondary | ICD-10-CM | POA: Diagnosis not present

## 2022-01-14 DIAGNOSIS — R2689 Other abnormalities of gait and mobility: Secondary | ICD-10-CM | POA: Diagnosis not present

## 2022-01-14 DIAGNOSIS — R262 Difficulty in walking, not elsewhere classified: Secondary | ICD-10-CM | POA: Insufficient documentation

## 2022-01-14 NOTE — Therapy (Signed)
OUTPATIENT PHYSICAL THERAPY PROGRESS NOTE   Patient Name: Rhonda Richardson MRN: [id number] DOB:12/06/1940, 81 y.o., female Today's Date: 01/14/2022   PT End of Session - 12/31/21 1356     Visit Number 5    Date for PT Re-Evaluation 03/10/22    Authorization Type Medicare    Authorization - Number of Visits 10    Progress Note Due on Visit 10    PT Start Time 1400    PT Stop Time 1435  stomach not feeling well   PT Time Calculation (min) 40 min    Activity Tolerance Patient tolerated treatment well             Past Medical History:  Diagnosis Date   Breast cancer ([place])    Hypertension    Past Surgical History:  Procedure Laterality Date   BREAST LUMPECTOMY WITH RADIOACTIVE SEED AND SENTINEL LYMPH NODE BIOPSY Right 09/15/2021   Procedure: RIGHT BREAST SEED BRACKETED LUMPECTOMY AND SENTINEL NODE BIOPSY;  Surgeon: [name], MD;  Location: [facility];  Service: General;  Laterality: Right;   cataract surgery Bilateral    DILATION AND CURETTAGE OF UTERUS  1970   EXCISION OF BREAST LESION Right 10/15/2021   Procedure: ASPIRATION RIGHT AXILLARY SEROMA;  Surgeon: [name], MD;  Location: [facility];  Service: General;  Laterality: Right;   eyelid tendon repair Bilateral    PORTACATH PLACEMENT N/A 09/15/2021   Procedure: PORT PLACEMENT;  Surgeon: [name], MD;  Location: [facility];  Service: General;  Laterality: N/A;   RE-EXCISION OF BREAST LUMPECTOMY Right 10/15/2021   Procedure: RE-EXCISION LUMPECTOMY RIGHT BREAST;  Surgeon: [name], MD;  Location: [facility];  Service: General;  Laterality: Right;   Patient Active Problem List   Diagnosis Date Noted   Port-A-Cath in place 11/18/2021   Genetic testing 09/28/2021   Malignant neoplasm of upper-outer quadrant of right breast in female, estrogen receptor positive ([place]) 09/08/2021    PCP: Kelton Pillar, MD  REFERRING PROVIDER: Benay Pike, MD  REFERRING DIAG: M62.89 (ICD-10-CM) -  Pelvic floor dysfunction  THERAPY DIAG:  Aftercare following surgery for neoplasm  Muscle weakness (generalized)  Other abnormalities of gait and mobility  History of falling  Rationale for Evaluation and Treatment Rehabilitation  ONSET DATE: 09/15/2021  SUBJECTIVE:                                                                                                                                                                                           SUBJECTIVE STATEMENT: Patient states she is tired today due to sleep pattern messed up since last chemo.   PAIN:  Are you having pain? 0/10  touching lower leg feels different   PRECAUTIONS: Other: Breast cancer  WEIGHT BEARING RESTRICTIONS No  FALLS:  Has patient fallen in last 6 months? Yes on 11/13/2021 she fell after the chemotherapy treatment due to sciatica.  LIVING ENVIRONMENT: Lives with: lives with their spouse  OCCUPATION: part time  PLOF: Independent  PATIENT GOALS pelvic floor education  PERTINENT HISTORY:  Patient was diagnosed on 08/12/2021 with right grade 2 invasive ductal carcinoma breast cancer. She had a right lumpectomy and sentinel node biopsy (0/4 nodes positive) on 09/15/2021 followed by a re-excision on 10/15/2021. It is triple positive with a Ki67 of 30%. Presently having chemotherapy.   BOWEL MOVEMENT Pain with bowel movement: No  URINATION Pain with urination: No Fully empty bladder: No  INTERCOURSE Pain with intercourse:  none and uses lubricant  PREGNANCY Vaginal deliveries 3 Tearing No C-section deliveries 0 Currently pregnant No     OBJECTIVE:   DIAGNOSTIC FINDINGS:  none   COGNITION:  Overall cognitive status: Within functional limits for tasks assessed     SENSATION:  Light touch: Appears intact on left, decreased on lateral and medial right lower leg  Proprioception: Appears intact                POSTURE: No Significant postural limitations Sit to stand 5 times 15 seconds.   TUG 11 sec Stand on right leg 3 sec Stand on left leg 15 sec with no issues Tandem stance with left leg in front 30 sec Tandem stance with right leg in front  30 sec with increased body sway  PELVIC ALIGNMENT:  LUMBARAROM/PROM  A/PROM A/PROM  eval 9/21  Extension Decreased by 25% WFLs    LOWER EXTREMITY PPJ:[id number] hip ROM is full  LOWER EXTREMITY MMT:  MMT Right eval Left eval 9/21  Hip flexion 4/5 5/5   Hip extension 4/5 4/5 4 right/left   Hip abduction 3+/5 3+/5 4- right/left   Hip external rotation 4/5 4/5   Knee flexion 5/5 5/5   Knee extension 4/5 5/5   Ankle dorsiflexion 4/5 5/5 4+ right   Ankle plantarflexion 2/5 3/5   Ankle inversion 5/5 5/5   Ankle eversion 5/5 5/5     PALPATION:   General  no tenderness                      TODAY'S TREATMENT  10/5: Nu-step L4 5 min Standing heel raises 3 ways: straight, toes in, toes out 5x each Lateral band walks at back counter (green loop) x 4 laps Up and over 4 inch step laterally x 20 (1 is 1) Marching on balance pad x 20 without UE support Sit to stand 2x 10 mat table Seated LAQ with 5 # 2 x 10 Seated green band HS curls 20x right/left Seated piriformis stretch 30 sec 2x  each side Seated up and over hurdle x 20 each LE (left off 5# due to fatigue) Seated neural floss 20x right/left Seated green band plantarflexion 20x right/left  Seated ankle eversion with green band 20x right/left Seated ankle dorsiflexion green band 20x right/left  9/28: Nu-step L5 5 min Seated piriformis stretch 30 sec 2x  each side Seated neural floss 20x right/left Seated hip flexion/abduction up and over hurdle on right and left 2 x 10 with 5# weight resting on knee  Seated green band plantarflexion 20x right/left  Seated ankle eversion with green band 20x right/left Seated ankle dorsiflexion green band 20x right/left  Seated green band HS curls 20x right/left Sit to stand 2x 10 mat table Standing heel raises 3 ways: straight,  toes in, toes out 5x each Lateral band walks at back counter (green loop) x 4 laps Up and over 4 inch step laterally x 20 (1 is 1) Seated LAQ with 5 # 2 x 10  9/26: Nu-step L1 4 min Seated piriformis stretch 30 sec 2x  each side Seated neural floss 10x right/left Seated hip flexion/abduction up and over hurdle on right and left 10x with 5# weight resting on knee  Seated green band plantarflexion 20x right/left  Seated ankle eversion with green band 20x right/left Seated ankle dorsiflexion green band 20x right/left Seated green band HS curls 20x right/left Sit to stand 2x 10 mat table Standing heel raises 3 ways: straight, toes in, toes out 5x each Lateral band walks at back counter (green loop) x 4 laps   PATIENT EDUCATION:  Education details: education on vaginal moisturizers and lubricants. Handed patient samples of each.  Person educated: Patient Education method: Theatre stage manager Education comprehension: verbalized understanding   HOME EXERCISE PROGRAM: Access Code: [email or web address] URL: [email or web address] Date: 12/17/2021 Prepared by: Ruben Im  Exercises - Seated Hip External Rotation Stretch  - 1 x daily - 7 x weekly - 1 sets - 3 reps - 30 hold - Seated Slump Nerve Glide  - 1 x daily - 7 x weekly - 1 sets - 5 reps - Seated March  - 1 x daily - 7 x weekly - 1 sets - 10 reps - Sit to Stand  - 1 x daily - 7 x weekly - 1 sets - 10 reps - Seated Hip Abduction with Resistance  - 1 x daily - 7 x weekly - 1 sets - 10 reps - Forward Step Up with Counter Support  - 1 x daily - 7 x weekly - 1 sets - 10 reps Given red band for home  ASSESSMENT:  CLINICAL IMPRESSION: Rhonda Richardson was able to complete all tasks today despite fatigue.  She mentioned she felt better at end of session.    She would benefit from continued skilled care for improving overall strength and endurance and to instruct in fall prevention.    OBJECTIVE IMPAIRMENTS Abnormal gait, decreased  activity tolerance, decreased balance, decreased coordination, decreased endurance, difficulty walking, and decreased strength.   ACTIVITY LIMITATIONS lifting, bending, squatting, stairs, transfers, and locomotion level  PARTICIPATION LIMITATIONS: cleaning, laundry, shopping, community activity, and occupation  PERSONAL FACTORS Age, Fitness, Past/current experiences, and 1-2 comorbidities: breast cancer and having chemotherapy  are also affecting patient's functional outcome.   REHAB POTENTIAL: Excellent  CLINICAL DECISION MAKING: Evolving/moderate complexity  EVALUATION COMPLEXITY: Moderate   GOALS: Goals reviewed with patient? Yes  SHORT TERM GOALS: Target date: 01/13/2022  Patient is independent with initial HEP for balance and strength.  Baseline: Goal status: ongoing  2.  Patient reports her endurance and steadiness on her right leg has improved >/= 25% the 2 weeks prior to her chemotherapy treatments.  Baseline:  Goal status: ongoing  3.  Patient educated on vaginal moisturizers and lubricants to promote good vaginal health.  Baseline:  Goal status: MET 12/16/2021  LONG TERM GOALS: Target date: 03/10/2022   Patient independent with advanced HEP for balance and strength to reduce the chance of falls.  Baseline:  Goal status: INITIAL  2.  Sit to stand 5x is </= 12.6 seconds to reduce the chance of her falling.  Baseline:  Goal status: INITIAL  3.  Patient able to stand on right leg for 15 seconds so she is able to go up and down steps with step over step pattern.  Baseline:  Goal status: INITIAL  4.  Tandem stance with right leg for 30 seconds with </= 50% less body swaying due to improved strength and balance.  Baseline:  Goal status: INITIAL  5.  Patient is able to squat and stand up with >/= 50% greater ease due to increased in right lower extremity strength.  Baseline:  Goal status: INITIAL   PLAN: PT FREQUENCY: 2x/week  PT DURATION: 12 weeks  PLANNED  INTERVENTIONS: Therapeutic exercises, Therapeutic activity, Neuromuscular re-education, Balance training, Gait training, Patient/Family education, Self Care, and Stair training  PLAN FOR NEXT SESSION:  recheck TUG; 5x sit to stand;  follow up on energy levels (low hemoglobin); review and progress HEP as needed;  work on right lower extremity strength, balance exercises, program will focus on balance strength and endurance while she is getting chemotherapy  Anderson Malta B. Fields, PT 01/14/22 11:43 AM  [facility] [address], [place], [place] [address] Phone # [phone] Fax [phone]

## 2022-01-18 ENCOUNTER — Other Ambulatory Visit: Payer: Self-pay | Admitting: *Deleted

## 2022-01-19 ENCOUNTER — Ambulatory Visit: Payer: Medicare Other

## 2022-01-21 ENCOUNTER — Ambulatory Visit: Payer: Medicare Other | Admitting: Physical Therapy

## 2022-01-26 ENCOUNTER — Ambulatory Visit: Payer: Medicare Other

## 2022-02-01 ENCOUNTER — Inpatient Hospital Stay: Payer: Medicare Other | Admitting: Adult Health

## 2022-02-01 ENCOUNTER — Inpatient Hospital Stay: Payer: Medicare Other

## 2022-02-01 ENCOUNTER — Encounter: Payer: Self-pay | Admitting: *Deleted

## 2022-02-01 DIAGNOSIS — C50411 Malignant neoplasm of upper-outer quadrant of right female breast: Secondary | ICD-10-CM

## 2022-02-02 ENCOUNTER — Inpatient Hospital Stay: Payer: Medicare Other | Admitting: Hematology and Oncology

## 2022-02-02 ENCOUNTER — Inpatient Hospital Stay: Payer: Medicare Other

## 2022-02-02 ENCOUNTER — Telehealth: Payer: Self-pay | Admitting: Radiation Oncology

## 2022-02-02 ENCOUNTER — Inpatient Hospital Stay (HOSPITAL_BASED_OUTPATIENT_CLINIC_OR_DEPARTMENT_OTHER): Payer: Medicare Other | Admitting: Hematology and Oncology

## 2022-02-02 ENCOUNTER — Encounter: Payer: Self-pay | Admitting: Hematology and Oncology

## 2022-02-02 VITALS — BP 141/68 | HR 88 | Temp 97.9°F | Resp 16 | Ht 62.0 in | Wt 135.0 lb

## 2022-02-02 VITALS — BP 131/68 | HR 83 | Temp 98.2°F | Resp 16

## 2022-02-02 DIAGNOSIS — Z5111 Encounter for antineoplastic chemotherapy: Secondary | ICD-10-CM | POA: Diagnosis not present

## 2022-02-02 DIAGNOSIS — Z170 Estrogen receptor positive status [ER+]: Secondary | ICD-10-CM

## 2022-02-02 DIAGNOSIS — Z5189 Encounter for other specified aftercare: Secondary | ICD-10-CM | POA: Diagnosis not present

## 2022-02-02 DIAGNOSIS — R5383 Other fatigue: Secondary | ICD-10-CM | POA: Diagnosis not present

## 2022-02-02 DIAGNOSIS — C50411 Malignant neoplasm of upper-outer quadrant of right female breast: Secondary | ICD-10-CM

## 2022-02-02 DIAGNOSIS — Z5112 Encounter for antineoplastic immunotherapy: Secondary | ICD-10-CM | POA: Diagnosis not present

## 2022-02-02 LAB — CBC WITH DIFFERENTIAL (CANCER CENTER ONLY)
Abs Immature Granulocytes: 0.01 10*3/uL (ref 0.00–0.07)
Basophils Absolute: 0.1 10*3/uL (ref 0.0–0.1)
Basophils Relative: 2 %
Eosinophils Absolute: 0.1 10*3/uL (ref 0.0–0.5)
Eosinophils Relative: 3 %
HCT: 28.1 % — ABNORMAL LOW (ref 36.0–46.0)
Hemoglobin: 9.3 g/dL — ABNORMAL LOW (ref 12.0–15.0)
Immature Granulocytes: 0 %
Lymphocytes Relative: 36 %
Lymphs Abs: 1.5 10*3/uL (ref 0.7–4.0)
MCH: 27.9 pg (ref 26.0–34.0)
MCHC: 33.1 g/dL (ref 30.0–36.0)
MCV: 84.4 fL (ref 80.0–100.0)
Monocytes Absolute: 0.5 10*3/uL (ref 0.1–1.0)
Monocytes Relative: 11 %
Neutro Abs: 2.1 10*3/uL (ref 1.7–7.7)
Neutrophils Relative %: 48 %
Platelet Count: 293 10*3/uL (ref 150–400)
RBC: 3.33 MIL/uL — ABNORMAL LOW (ref 3.87–5.11)
RDW: 18.3 % — ABNORMAL HIGH (ref 11.5–15.5)
WBC Count: 4.3 10*3/uL (ref 4.0–10.5)
nRBC: 0 % (ref 0.0–0.2)

## 2022-02-02 LAB — CMP (CANCER CENTER ONLY)
ALT: 21 U/L (ref 0–44)
AST: 19 U/L (ref 15–41)
Albumin: 3.9 g/dL (ref 3.5–5.0)
Alkaline Phosphatase: 81 U/L (ref 38–126)
Anion gap: 6 (ref 5–15)
BUN: 34 mg/dL — ABNORMAL HIGH (ref 8–23)
CO2: 28 mmol/L (ref 22–32)
Calcium: 9.4 mg/dL (ref 8.9–10.3)
Chloride: 105 mmol/L (ref 98–111)
Creatinine: 0.98 mg/dL (ref 0.44–1.00)
GFR, Estimated: 59 mL/min — ABNORMAL LOW (ref >60)
Glucose, Bld: 89 mg/dL (ref 70–99)
Potassium: 4.2 mmol/L (ref 3.5–5.1)
Sodium: 139 mmol/L (ref 135–145)
Total Bilirubin: 0.4 mg/dL (ref 0.3–1.2)
Total Protein: 6.3 g/dL — ABNORMAL LOW (ref 6.5–8.1)

## 2022-02-02 MED ORDER — SODIUM CHLORIDE 0.9 % IV SOLN
284.0000 mg | Freq: Once | INTRAVENOUS | Status: AC
  Administered 2022-02-02: 280 mg via INTRAVENOUS
  Filled 2022-02-02: qty 28

## 2022-02-02 MED ORDER — SODIUM CHLORIDE 0.9% FLUSH
10.0000 mL | INTRAVENOUS | Status: DC | PRN
  Administered 2022-02-02: 10 mL

## 2022-02-02 MED ORDER — ACETAMINOPHEN 325 MG PO TABS
650.0000 mg | ORAL_TABLET | Freq: Once | ORAL | Status: AC
  Administered 2022-02-02: 650 mg via ORAL
  Filled 2022-02-02: qty 2

## 2022-02-02 MED ORDER — TRASTUZUMAB-DKST CHEMO 150 MG IV SOLR
6.0000 mg/kg | Freq: Once | INTRAVENOUS | Status: AC
  Administered 2022-02-02: 378 mg via INTRAVENOUS
  Filled 2022-02-02: qty 18

## 2022-02-02 MED ORDER — HEPARIN SOD (PORK) LOCK FLUSH 100 UNIT/ML IV SOLN
500.0000 [IU] | Freq: Once | INTRAVENOUS | Status: AC | PRN
  Administered 2022-02-02: 500 [IU]

## 2022-02-02 MED ORDER — SODIUM CHLORIDE 0.9 % IV SOLN: Freq: Once | INTRAVENOUS | Status: AC

## 2022-02-02 MED ORDER — SODIUM CHLORIDE 0.9 % IV SOLN
150.0000 mg | Freq: Once | INTRAVENOUS | Status: AC
  Administered 2022-02-02: 150 mg via INTRAVENOUS
  Filled 2022-02-02: qty 150

## 2022-02-02 MED ORDER — PALONOSETRON HCL INJECTION 0.25 MG/5ML
0.2500 mg | Freq: Once | INTRAVENOUS | Status: AC
  Administered 2022-02-02: 0.25 mg via INTRAVENOUS
  Filled 2022-02-02: qty 5

## 2022-02-02 MED ORDER — SODIUM CHLORIDE 0.9 % IV SOLN
10.0000 mg | Freq: Once | INTRAVENOUS | Status: AC
  Administered 2022-02-02: 10 mg via INTRAVENOUS
  Filled 2022-02-02: qty 10

## 2022-02-02 MED ORDER — DIPHENHYDRAMINE HCL 25 MG PO CAPS
50.0000 mg | ORAL_CAPSULE | Freq: Once | ORAL | Status: AC
  Administered 2022-02-02: 50 mg via ORAL
  Filled 2022-02-02: qty 2

## 2022-02-02 NOTE — Progress Notes (Signed)
[facility] PROGRESS NOTE  Patient Care Team: Collene Leyden, MD as PCP - General (Family Medicine) [name], MD as Consulting Physician (General Surgery) Benay Pike, MD as Consulting Physician (Hematology and Oncology) Kyung Rudd, MD as Consulting Physician (Radiation Oncology) Mauro Kaufmann, RN as Oncology Nurse Navigator Rockwell Germany, RN as Oncology Nurse Navigator  CHIEF COMPLAINTS/PURPOSE OF CONSULTATION:  Breast cancer  SUMMARY OF ONCOLOGIC HISTORY: Oncology History  Malignant neoplasm of upper-outer quadrant of right breast in female, estrogen receptor positive ([place])  08/12/2021 Mammogram   Mammogram showed possible mass in the right breast.  Diagnostic mammogram showed 2 highly suspicious upper outer right breast masses measuring 1.5 cm in the 9:30 position and 1.4 cm at the 10 o'clock position.  These masses or distortions mammographically spanning a distance of up to 3.5 cm.  No abnormal appearing right axillary lymph nodes.   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  Prognostic showed ER 99% positive strong staining PR 10% positive moderate staining, HER2 positive 3+ and Ki-67 of 30%.   09/08/2021 Initial Diagnosis   Malignant neoplasm of upper-outer quadrant of right breast in female, estrogen receptor positive ([place])   09/15/2021 Pathology Results   She had right breast lumpectomy which showed invasive ductal carcinoma, 3.6 cm in maximal extent involving inferior and medial margins, DCIS approaching to less than 0.1 cm of closest margin no metastatic carcinoma identified in 4 out of 4 lymph nodes.  She is scheduled for repeat surgery given positive margins.  Prior prognostic showed ER 99% positive strong staining intensity PR 10% positive moderate staining intensity, HER2 positive.   09/23/2021 Genetic Testing   Negative hereditary cancer genetic testing: no pathogenic variants  detected in [facility] CustomNext-cancer +RNAinsight Panel.  Report date is September 23, 2021.   The CustomNext-Cancer+RNAinsight panel offered by [facility] includes sequencing and rearrangement analysis for the following 47 genes:  APC, ATM, AXIN2, BARD1, BMPR1A, BRCA1, BRCA2, BRIP1, CDH1, CDK4, CDKN2A, CHEK2, DICER1, EPCAM, GREM1, HOXB13, MEN1, MLH1, MSH2, MSH3, MSH6, MUTYH, NBN, NF1, NF2, NTHL1, PALB2, PMS2, POLD1, POLE, PTEN, RAD51C, RAD51D, RECQL, RET, SDHA, SDHAF2, SDHB, SDHC, SDHD, SMAD4, SMARCA4, STK11, TP53, TSC1, TSC2, and VHL.  RNA data is routinely analyzed for use in variant interpretation for all genes.   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 DCIS 0.25 mg from true/new margin.   11/10/2021 - 12/02/2021 Chemotherapy   Patient is on Treatment Plan : BREAST Docetaxel + Carboplatin + Trastuzumab ([place]) q21d / Trastuzumab q21d     11/10/2021 - 12/03/2021 Chemotherapy   Patient is on Treatment Plan : BREAST  Docetaxel + Carboplatin + Trastuzumab + Pertuzumab  ([facility]) q21d      11/10/2021 -  Chemotherapy   Patient is on Treatment Plan : BREAST Docetaxel + Carboplatin + Trastuzumab ([place]) q21d / Trastuzumab q21d      INTERVAL HISTORY:  ANGELEA PENNY returns for a follow up visit after receiving Cycle 4 of carboplatin/docetaxel and trastuzumab on 11/30/2021.  Since last visit, she has been doing well. She didn't have any issues with constipation. Heartburn is still an issue for the first few days after chemo, more so about 5 or 6 days after chemotherapy. She has been taking pepcid. 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 No neuropathy in her fingers. She still cant taste food, but forces herself to eat. She tends to repeat everything I say to make sure she understands. Rest of the  pertinent 10 point ROS reviewed and negative  MEDICAL HISTORY:  Past Medical History:  Diagnosis Date   Breast cancer ([place])    Hypertension     SURGICAL HISTORY: Past Surgical History:  Procedure Laterality Date   BREAST LUMPECTOMY WITH RADIOACTIVE SEED AND SENTINEL LYMPH NODE BIOPSY Right 09/15/2021   Procedure: RIGHT BREAST SEED BRACKETED LUMPECTOMY AND SENTINEL NODE BIOPSY;  Surgeon: [name], MD;  Location: [facility];  Service: General;  Laterality: Right;   cataract surgery Bilateral    DILATION AND CURETTAGE OF UTERUS  1970   EXCISION OF BREAST LESION Right 10/15/2021   Procedure: ASPIRATION RIGHT AXILLARY SEROMA;  Surgeon: [name], MD;  Location: [facility];  Service: General;  Laterality: Right;   eyelid tendon repair Bilateral    PORTACATH PLACEMENT N/A 09/15/2021   Procedure: PORT PLACEMENT;  Surgeon: [name], MD;  Location: [facility];  Service: General;  Laterality: N/A;   RE-EXCISION OF BREAST LUMPECTOMY Right 10/15/2021   Procedure: RE-EXCISION LUMPECTOMY RIGHT BREAST;  Surgeon: [name], MD;  Location: [facility];  Service: General;  Laterality: Right;    SOCIAL HISTORY: Social History   Socioeconomic History   Marital status: Married    Spouse name: Not on file   Number of children: Not on file   Years of education: Not on file   Highest education level: Not on file  Occupational History   Not on file  Tobacco Use   Smoking status: Never   Smokeless tobacco: Not on file  Vaping Use   Vaping Use: Never used  Substance and Sexual Activity   Alcohol use: Yes    Comment: 3- 4 drinks   Drug use: Never   Sexual activity: Not Currently  Other Topics Concern   Not on file  Social History Narrative   Not on file   Social Determinants of Health   Financial Resource Strain: Low Risk  (09/09/2021)   Overall Financial Resource Strain (CARDIA)    Difficulty of Paying Living Expenses: Not hard at all  Food Insecurity: No Food  Insecurity (09/09/2021)   Hunger Vital Sign    Worried About Running Out of Food in the Last Year: Never true    Ran Out of Food in the Last Year: Never true  Transportation Needs: No Transportation Needs (09/09/2021)   PRAPARE - Hydrologist (Medical): No    Lack of Transportation (Non-Medical): No  Physical Activity: Not on file  Stress: Not on file  Social Connections: Not on file  Intimate Partner Violence: Not on file    FAMILY HISTORY: Family History  Problem Relation Age of Onset   Heart disease Father 73   Thyroid cancer Other 66       niece's daughter    ALLERGIES:  is allergic to macrodantin [nitrofurantoin].  MEDICATIONS:  Current Outpatient Medications  Medication Sig Dispense Refill   acetaminophen (TYLENOL) 325 MG tablet Take 325 mg by mouth every 6 (six) hours as needed for moderate pain.     benazepril (LOTENSIN) 20 MG tablet Take 20 mg by mouth daily.     cholecalciferol (VITAMIN D3) 25 MCG (1000 UNIT) tablet Take 1,000 Units by mouth daily.     famotidine (PEPCID) 20 MG  tablet Take 20 mg by mouth daily as needed for heartburn or indigestion.     hydrochlorothiazide (MICROZIDE) 12.5 MG capsule Take 12.5 mg by mouth daily.     ibuprofen (ADVIL) 200 MG tablet Take 200 mg by mouth every 6 (six) hours as needed for moderate pain.     Magnesium 250 MG TABS Take 250 mg by mouth daily.     Red Yeast Rice 600 MG CAPS Take 600 mg by mouth daily.     traMADol (ULTRAM) 50 MG tablet Take by mouth every 6 (six) hours as needed.     Vitamins-Lipotropics (MULTI-VITAMIN HP/MINERALS PO) Take 1 tablet by mouth daily.     No current facility-administered medications for this visit.    PHYSICAL EXAMINATION: ECOG PERFORMANCE STATUS: 0 - Asymptomatic  Vitals:   02/02/22 0921  BP: (!) 141/68  Pulse: 88  Resp: 16  Temp: 97.9 F (36.6 C)  SpO2: 100%    Filed Weights   02/02/22 0921  Weight: 135 lb (61.2 kg)   Physical Exam Constitutional:       Appearance: Normal appearance.  Cardiovascular:     Rate and Rhythm: Normal rate and regular rhythm.  Pulmonary:     Effort: Pulmonary effort is normal.     Breath sounds: Normal breath sounds.  Musculoskeletal:     Cervical back: Normal range of motion and neck supple. No rigidity.  Lymphadenopathy:     Cervical: No cervical adenopathy.  Skin:    General: Skin is warm and dry.  Neurological:     General: No focal deficit present.     Mental Status: She is alert.  Psychiatric:        Mood and Affect: Mood normal.     LABORATORY DATA:  I have reviewed the data as listed Lab Results  Component Value Date   WBC 4.3 02/02/2022   HGB 9.3 (L) 02/02/2022   HCT 28.1 (L) 02/02/2022   MCV 84.4 02/02/2022   PLT 293 02/02/2022   Lab Results  Component Value Date   NA 137 01/11/2022   K 3.9 01/11/2022   CL 104 01/11/2022   CO2 24 01/11/2022    RADIOGRAPHIC STUDIES: I have personally reviewed the radiological reports and agreed with the findings in the report.  ASSESSMENT AND PLAN:  Rhonda Richardson is a 81 y.o. female who presents for a follow up for right breast cancer.   #Malignant neoplasm of upper-outer quadrant of right breast in female, estrogen receptor positive  --ER/PR positive, HER2 amplified.  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 Media margin showed focal high-grade ductal carcinoma in situ, solid type with necrosis, negative for invasive carcinoma. Inferior and posterior margin negative for carcinoma.  --Given large tumor, triple positive, recommend dose modified TCH for 6 cycles followed by adjuvant herceptin vs taxol with herceptin. --Baseline echo from 09/14/2021 was satisfactory with EF 55-60%.  --Patient started Cycle 1, Day 1 of [place] on 11/10/2021. GCSF injection given on Day 3 of each cycle.   --She is now here cycle 5 of chemotherapy. Overall she tolerated chemo well. Today she reports persistent neuropathy, hence we will omit docetaxel.  #Acid Reflux:  --continue pepcid - will reduce dex to 4 mg a day before and for 2 days after chemotherapy  #Constipation: --Continue to take miralax and/or dulcolax as needed. -- No current constipation.  # Neuropathy, will omit  docetaxel.  # BLE edema,  resolved.  # We used the teach back technique to make sure she understands. She repeats everything to make sure she understands.   RTC in 3 weeks or sooner as needed.  All questions were answered. The patient knows to call the clinic with any problems, questions or concerns.   I have spent a total of 30 minutes minutes of face-to-face and non-face-to-face time, preparing to see the patient, performing a medically appropriate examination, counseling and educating the patient, documenting clinical information in the electronic health record, and care coordination.

## 2022-02-02 NOTE — Patient Instructions (Signed)
Rhonda Richardson ONCOLOGY  Discharge Instructions: Thank you for choosing [facility] to provide your oncology and hematology care.   If you have a lab appointment with the [facility], please go directly to the [facility] and check in at the registration area.   Wear comfortable clothing and clothing appropriate for easy access to any Portacath or PICC line.   We strive to give you quality time with your provider. You may need to reschedule your appointment if you arrive late (15 or more minutes).  Arriving late affects you and other patients whose appointments are after yours.  Also, if you miss three or more appointments without notifying the office, you may be dismissed from the clinic at the provider's discretion.      For prescription refill requests, have your pharmacy contact our office and allow 72 hours for refills to be completed.    Today you received the following chemotherapy and/or immunotherapy agents: Trastuzumab, Carboplatin   To help prevent nausea and vomiting after your treatment, we encourage you to take your nausea medication as directed.  BELOW ARE SYMPTOMS THAT SHOULD BE REPORTED IMMEDIATELY: *FEVER GREATER THAN 100.4 F (38 C) OR HIGHER *CHILLS OR SWEATING *NAUSEA AND VOMITING THAT IS NOT CONTROLLED WITH YOUR NAUSEA MEDICATION *UNUSUAL SHORTNESS OF BREATH *UNUSUAL BRUISING OR BLEEDING *URINARY PROBLEMS (pain or burning when urinating, or frequent urination) *BOWEL PROBLEMS (unusual diarrhea, constipation, pain near the anus) TENDERNESS IN MOUTH AND THROAT WITH OR WITHOUT PRESENCE OF ULCERS (sore throat, sores in mouth, or a toothache) UNUSUAL RASH, SWELLING OR PAIN  UNUSUAL VAGINAL DISCHARGE OR ITCHING   Items with * indicate a potential emergency and should be followed up as soon as possible or go to the Emergency Department if any problems should occur.  Please show the CHEMOTHERAPY ALERT CARD or IMMUNOTHERAPY ALERT CARD at  check-in to the Emergency Department and triage nurse.  Should you have questions after your visit or need to cancel or reschedule your appointment, please contact [facility]  Dept: [phone]  and follow the prompts.  Office hours are 8:00 a.m. to 4:30 p.m. Monday - Friday. Please note that voicemails left after 4:00 p.m. may not be returned until the following business day.  We are closed weekends and major holidays. You have access to a nurse at all times for urgent questions. Please call the main number to the clinic Dept: [phone] and follow the prompts.   For any non-urgent questions, you may also contact your provider using MyChart. We now offer e-Visits for anyone 61 and older to request care online for non-urgent symptoms. For details visit [email or web address].   Also download the MyChart app! Go to the app store, search "MyChart", open the app, select [facility], and log in with your MyChart username and password.  Masks are optional in the cancer centers. If you would like for your care team to wear a mask while they are taking care of you, please let them know. You may have one support person who is at least 79 years old accompany you for your appointments.

## 2022-02-02 NOTE — Telephone Encounter (Signed)
10/24 @ 9:34 am Left voicemail for patient to call our office to be schedule for consult.

## 2022-02-03 ENCOUNTER — Inpatient Hospital Stay: Payer: Medicare Other

## 2022-02-04 ENCOUNTER — Inpatient Hospital Stay: Payer: Medicare Other

## 2022-02-04 ENCOUNTER — Ambulatory Visit: Payer: Medicare Other | Admitting: Physical Therapy

## 2022-02-04 DIAGNOSIS — R293 Abnormal posture: Secondary | ICD-10-CM | POA: Diagnosis not present

## 2022-02-04 DIAGNOSIS — M6281 Muscle weakness (generalized): Secondary | ICD-10-CM

## 2022-02-04 DIAGNOSIS — R2689 Other abnormalities of gait and mobility: Secondary | ICD-10-CM

## 2022-02-04 DIAGNOSIS — Z483 Aftercare following surgery for neoplasm: Secondary | ICD-10-CM

## 2022-02-04 DIAGNOSIS — Z9181 History of falling: Secondary | ICD-10-CM | POA: Diagnosis not present

## 2022-02-04 DIAGNOSIS — R262 Difficulty in walking, not elsewhere classified: Secondary | ICD-10-CM | POA: Diagnosis not present

## 2022-02-04 MED ORDER — PEGFILGRASTIM-CBQV 6 MG/0.6ML ~~LOC~~ SOSY: 6.0000 mg | PREFILLED_SYRINGE | Freq: Once | SUBCUTANEOUS | Status: DC

## 2022-02-04 NOTE — Therapy (Signed)
OUTPATIENT PHYSICAL THERAPY PROGRESS NOTE   Patient Name: Rhonda Richardson MRN: [id number] DOB:11/08/1940, 81 y.o., female Today's Date: 02/04/2022  PT End of Session - 02/04/22 1102     Visit Number 9    Date for PT Re-Evaluation 03/10/22    Authorization Type Medicare    Authorization - Visit Number 9    Authorization - Number of Visits 10    PT Start Time 1103    PT Stop Time 1143    PT Time Calculation (min) 40 min    Activity Tolerance Patient tolerated treatment well                  Past Medical History:  Diagnosis Date   Breast cancer ([place])    Hypertension    Past Surgical History:  Procedure Laterality Date   BREAST LUMPECTOMY WITH RADIOACTIVE SEED AND SENTINEL LYMPH NODE BIOPSY Right 09/15/2021   Procedure: RIGHT BREAST SEED BRACKETED LUMPECTOMY AND SENTINEL NODE BIOPSY;  Surgeon: [name], MD;  Location: [facility];  Service: General;  Laterality: Right;   cataract surgery Bilateral    DILATION AND CURETTAGE OF UTERUS  1970   EXCISION OF BREAST LESION Right 10/15/2021   Procedure: ASPIRATION RIGHT AXILLARY SEROMA;  Surgeon: [name], MD;  Location: [facility];  Service: General;  Laterality: Right;   eyelid tendon repair Bilateral    PORTACATH PLACEMENT N/A 09/15/2021   Procedure: PORT PLACEMENT;  Surgeon: [name], MD;  Location: [facility];  Service: General;  Laterality: N/A;   RE-EXCISION OF BREAST LUMPECTOMY Right 10/15/2021   Procedure: RE-EXCISION LUMPECTOMY RIGHT BREAST;  Surgeon: [name], MD;  Location: [facility];  Service: General;  Laterality: Right;   Patient Active Problem List   Diagnosis Date Noted   Port-A-Cath in place 11/18/2021   Genetic testing 09/28/2021   Malignant neoplasm of upper-outer quadrant of right breast in female, estrogen receptor positive ([place]) 09/08/2021    PCP: Kelton Pillar, MD  REFERRING PROVIDER: Benay Pike, MD  REFERRING DIAG: M62.89 (ICD-10-CM) - Pelvic floor  dysfunction  THERAPY DIAG:  Aftercare following surgery for neoplasm  Muscle weakness (generalized)  Other abnormalities of gait and mobility  Rationale for Evaluation and Treatment Rehabilitation  ONSET DATE: 09/15/2021  SUBJECTIVE:                                                                                                                                                                                           SUBJECTIVE STATEMENT: The last chemo was hard on me.  I got really down.   My hemoglobin is 9.0.  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  I dropped the medicine that does this.  Radiation will start in January.  Taking care of grandbabies, hard to get back up from a squat.    PAIN:  Are you having pain? 0/10  touching lower leg feels different   PRECAUTIONS: Other: Breast cancer  WEIGHT BEARING RESTRICTIONS No  FALLS:  Has patient fallen in last 6 months? Yes on 11/13/2021 she fell after the chemotherapy treatment due to sciatica.  LIVING ENVIRONMENT: Lives with: lives with their spouse  OCCUPATION: part time  PLOF: Independent  PATIENT GOALS pelvic floor education  PERTINENT HISTORY:  Patient was diagnosed on 08/12/2021 with right grade 2 invasive ductal carcinoma breast cancer. She had a right lumpectomy and sentinel node biopsy (0/4 nodes positive) on 09/15/2021 followed by a re-excision on 10/15/2021. It is triple positive with a Ki67 of 30%. Presently having chemotherapy.   BOWEL MOVEMENT Pain with bowel movement: No  URINATION Pain with urination: No Fully empty bladder: No  INTERCOURSE Pain with intercourse:  none and uses lubricant  PREGNANCY Vaginal deliveries 3 Tearing No C-section deliveries 0 Currently pregnant No     OBJECTIVE:   DIAGNOSTIC FINDINGS:  none   COGNITION:  Overall cognitive status: Within functional limits for tasks assessed     SENSATION:  Light touch: Appears intact on left, decreased on lateral and medial right  lower leg  Proprioception: Appears intact                POSTURE: No Significant postural limitations   Sit to stand 5 times 15 seconds.  TUG 11 sec Stand on right leg 3 sec Stand on left leg 15 sec with no issues Tandem stance with left leg in front 30 sec Tandem stance with right leg in front  30 sec with increased body sway   Recheck 10/26:  5x sit to stand:  12 sec                             TUG:  7 sec    PELVIC ALIGNMENT:  LUMBARAROM/PROM  A/PROM A/PROM  eval 9/21  Extension Decreased by 25% WFLs    LOWER EXTREMITY [id number] hip ROM is full  LOWER EXTREMITY MMT:  MMT Right eval Left eval 9/21  Hip flexion 4/5 5/5   Hip extension 4/5 4/5 4 right/left   Hip abduction 3+/5 3+/5 4- right/left   Hip external rotation 4/5 4/5   Knee flexion 5/5 5/5   Knee extension 4/5 5/5   Ankle dorsiflexion 4/5 5/5 4+ right   Ankle plantarflexion 2/5 3/5   Ankle inversion 5/5 5/5   Ankle eversion 5/5 5/5     PALPATION:   General  no tenderness                      TODAY'S TREATMENT  10/26: Nu-step L4 5 min 5x sit to stand TUG Squats at the sink 5x Discussed sit to stand with weight when hemoglobin improved Discussed sitting on a stool in front of sink when hemoglobin improved Standing heel raises 3 ways: straight, toes in, toes out 5x each Lateral band walks at back counter (green loop) x 4 laps Step taps 10x alternating LAQ red band 10x right/left 3# weight on knee lift up and over low cone 10x right/left Therapeutic activity:  squatting to lift grandson, standing, walking        10/5: Nu-step L4 5 min Standing  heel raises 3 ways: straight, toes in, toes out 5x each Lateral band walks at back counter (green loop) x 4 laps Up and over 4 inch step laterally x 20 (1 is 1) Marching on balance pad x 20 without UE support Sit to stand 2x 10 mat table Seated LAQ with 5 # 2 x 10 Seated green band HS curls 20x right/left Seated piriformis stretch 30 sec  2x  each side Seated up and over hurdle x 20 each LE (left off 5# due to fatigue) Seated neural floss 20x right/left Seated green band plantarflexion 20x right/left  Seated ankle eversion with green band 20x right/left Seated ankle dorsiflexion green band 20x right/left  PATIENT EDUCATION:  Education details: education on vaginal moisturizers and lubricants. Handed patient samples of each.  Person educated: Patient Education method: Theatre stage manager Education comprehension: verbalized understanding   HOME EXERCISE PROGRAM: Access Code: [email or web address] URL: [email or web address] Date: 02/04/2022 Prepared by: Ruben Im  Exercises - Seated Hip External Rotation Stretch  - 1 x daily - 7 x weekly - 1 sets - 3 reps - 30 hold - Seated Slump Nerve Glide  - 1 x daily - 7 x weekly - 1 sets - 5 reps - Seated March  - 1 x daily - 7 x weekly - 1 sets - 10 reps - Sit to Stand  - 1 x daily - 7 x weekly - 1 sets - 10 reps - Seated Hip Abduction with Resistance  - 1 x daily - 7 x weekly - 1 sets - 10 reps - Forward Step Up with Counter Support  - 1 x daily - 7 x weekly - 1 sets - 10 reps - Seated Hip Flexion March with Ankle Weights  - 1 x daily - 7 x weekly - 10 reps - Seated Knee Extension with Resistance  - 1 x daily - 7 x weekly - 1 sets - 10 reps Given red band for home  ASSESSMENT:  CLINICAL IMPRESSION: The patient returns to PT following a chemo treatment which affected her more than previous treatments.  Her hemoglobin is still low, so intensity of ex continued to be reduced.  She reports difficulty when she babysits her grandson and lifting him as well as rising from a deep squat in general.  She has much improved TUG and 5x sit to stand times by 3-4 sec each and now within normal ranges.  All STGs met.   OBJECTIVE IMPAIRMENTS Abnormal gait, decreased activity tolerance, decreased balance, decreased coordination, decreased endurance, difficulty walking, and decreased  strength.   ACTIVITY LIMITATIONS lifting, bending, squatting, stairs, transfers, and locomotion level  PARTICIPATION LIMITATIONS: cleaning, laundry, shopping, community activity, and occupation  PERSONAL FACTORS Age, Fitness, Past/current experiences, and 1-2 comorbidities: breast cancer and having chemotherapy  are also affecting patient's functional outcome.   REHAB POTENTIAL: Excellent  CLINICAL DECISION MAKING: Evolving/moderate complexity  EVALUATION COMPLEXITY: Moderate   GOALS: Goals reviewed with patient? Yes  SHORT TERM GOALS: Target date: 01/13/2022  Patient is independent with initial HEP for balance and strength.  Baseline: Goal status: goal met 10/26  2.  Patient reports her endurance and steadiness on her right leg has improved >/= 25% the 2 weeks prior to her chemotherapy treatments.  Baseline:  Goal status: met 10/26  3.  Patient educated on vaginal moisturizers and lubricants to promote good vaginal health.  Baseline:  Goal status: MET 12/16/2021  LONG TERM GOALS: Target date: 03/10/2022   Patient independent with advanced HEP for balance  and strength to reduce the chance of falls.  Baseline:  Goal status: INITIAL  2.  Sit to stand 5x is </= 12.6 seconds to reduce the chance of her falling.  Baseline:  Goal status: goal met 10/26  3.  Patient able to stand on right leg for 15 seconds so she is able to go up and down steps with step over step pattern.  Baseline:  Goal status: INITIAL  4.  Tandem stance with right leg for 30 seconds with </= 50% less body swaying due to improved strength and balance.  Baseline:  Goal status: INITIAL  5.  Patient is able to squat and stand up with >/= 50% greater ease due to increased in right lower extremity strength.  Baseline:  Goal status: INITIAL   PLAN: PT FREQUENCY: 2x/week  PT DURATION: 12 weeks  PLANNED INTERVENTIONS: Therapeutic exercises, Therapeutic activity, Neuromuscular re-education, Balance  training, Gait training, Patient/Family education, Self Care, and Stair training  PLAN FOR NEXT SESSION:  10th visit progress note; try leg press;  follow up on energy levels (low hemoglobin); review and progress HEP as needed;  work on right lower extremity strength, balance exercises, program will focus on balance strength and endurance while she is getting chemotherapy  Ruben Im, PT 02/04/22 12:31 PM Phone: [phone] Fax: [phone]  [place], [place] [address] Phone # [phone] Fax [phone]

## 2022-02-09 ENCOUNTER — Ambulatory Visit: Payer: Medicare Other

## 2022-02-09 DIAGNOSIS — R293 Abnormal posture: Secondary | ICD-10-CM

## 2022-02-09 DIAGNOSIS — Z9181 History of falling: Secondary | ICD-10-CM

## 2022-02-09 DIAGNOSIS — Z483 Aftercare following surgery for neoplasm: Secondary | ICD-10-CM | POA: Diagnosis not present

## 2022-02-09 DIAGNOSIS — M6281 Muscle weakness (generalized): Secondary | ICD-10-CM

## 2022-02-09 DIAGNOSIS — R252 Cramp and spasm: Secondary | ICD-10-CM

## 2022-02-09 DIAGNOSIS — R262 Difficulty in walking, not elsewhere classified: Secondary | ICD-10-CM

## 2022-02-09 DIAGNOSIS — R2689 Other abnormalities of gait and mobility: Secondary | ICD-10-CM | POA: Diagnosis not present

## 2022-02-09 NOTE — Therapy (Signed)
OUTPATIENT PHYSICAL THERAPY PROGRESS NOTE   Patient Name: Rhonda Richardson MRN: [id number] DOB:December 28, 1940, 81 y.o., female Today's Date: 02/09/2022  PT End of Session - 02/09/22 1104     Visit Number 10    Date for PT Re-Evaluation 03/10/22    Authorization Type Medicare    Authorization - Visit Number 10    Progress Note Due on Visit 20    PT Start Time 1104    PT Stop Time 1145    PT Time Calculation (min) 41 min    Activity Tolerance Patient tolerated treatment well    Behavior During Therapy WFL for tasks assessed/performed                  Past Medical History:  Diagnosis Date   Breast cancer ([place])    Hypertension    Past Surgical History:  Procedure Laterality Date   BREAST LUMPECTOMY WITH RADIOACTIVE SEED AND SENTINEL LYMPH NODE BIOPSY Right 09/15/2021   Procedure: RIGHT BREAST SEED BRACKETED LUMPECTOMY AND SENTINEL NODE BIOPSY;  Surgeon: [name], MD;  Location: [facility];  Service: General;  Laterality: Right;   cataract surgery Bilateral    DILATION AND CURETTAGE OF UTERUS  1970   EXCISION OF BREAST LESION Right 10/15/2021   Procedure: ASPIRATION RIGHT AXILLARY SEROMA;  Surgeon: [name], MD;  Location: [facility];  Service: General;  Laterality: Right;   eyelid tendon repair Bilateral    PORTACATH PLACEMENT N/A 09/15/2021   Procedure: PORT PLACEMENT;  Surgeon: [name], MD;  Location: [facility];  Service: General;  Laterality: N/A;   RE-EXCISION OF BREAST LUMPECTOMY Right 10/15/2021   Procedure: RE-EXCISION LUMPECTOMY RIGHT BREAST;  Surgeon: [name], MD;  Location: [facility];  Service: General;  Laterality: Right;   Patient Active Problem List   Diagnosis Date Noted   Port-A-Cath in place 11/18/2021   Genetic testing 09/28/2021   Malignant neoplasm of upper-outer quadrant of right breast in female, estrogen receptor positive ([place]) 09/08/2021    PCP: Kelton Pillar, MD  REFERRING PROVIDER: Benay Pike,  MD  REFERRING DIAG: M62.89 (ICD-10-CM) - Pelvic floor dysfunction  THERAPY DIAG:  Aftercare following surgery for neoplasm  Muscle weakness (generalized)  History of falling  Difficulty in walking, not elsewhere classified  Abnormal posture  Cramp and spasm  Rationale for Evaluation and Treatment Rehabilitation  ONSET DATE: 09/15/2021  SUBJECTIVE:                                                                                                                                                                                           SUBJECTIVE STATEMENT: Feeling ok  today. Neuropathy still present.    PAIN:  Are you having pain? 0/10  touching lower leg feels different   PRECAUTIONS: Other: Breast cancer  WEIGHT BEARING RESTRICTIONS No  FALLS:  Has patient fallen in last 6 months? Yes on 11/13/2021 she fell after the chemotherapy treatment due to sciatica.  LIVING ENVIRONMENT: Lives with: lives with their spouse  OCCUPATION: part time  PLOF: Independent  PATIENT GOALS pelvic floor education  PERTINENT HISTORY:  Patient was diagnosed on 08/12/2021 with right grade 2 invasive ductal carcinoma breast cancer. She had a right lumpectomy and sentinel node biopsy (0/4 nodes positive) on 09/15/2021 followed by a re-excision on 10/15/2021. It is triple positive with a Ki67 of 30%. Presently having chemotherapy.   BOWEL MOVEMENT Pain with bowel movement: No  URINATION Pain with urination: No Fully empty bladder: No  INTERCOURSE Pain with intercourse:  none and uses lubricant  PREGNANCY Vaginal deliveries 3 Tearing No C-section deliveries 0 Currently pregnant No  Progress Note Reporting Period 12/16/21 to 02/09/22  See note below for Objective Data and Assessment of Progress/Goals.       OBJECTIVE:   DIAGNOSTIC FINDINGS:  none   COGNITION:  Overall cognitive status: Within functional limits for tasks assessed     SENSATION:  Light touch: Appears intact on left,  decreased on lateral and medial right lower leg  Proprioception: Appears intact                POSTURE: No Significant postural limitations   Sit to stand 5 times 15 seconds.  TUG 11 sec Stand on right leg 3 sec Stand on left leg 15 sec with no issues Tandem stance with left leg in front 30 sec Tandem stance with right leg in front  30 sec with increased body sway   Recheck 10/26:  5x sit to stand:  12 sec                             TUG:  7 sec    PELVIC ALIGNMENT:  LUMBARAROM/PROM  A/PROM A/PROM  eval 9/21  Extension Decreased by 25% WFLs    LOWER EXTREMITY [id number] hip ROM is full  LOWER EXTREMITY MMT:  MMT Right eval Left eval 9/21 10/31  Hip flexion 4/5 5/5    Hip extension 4/5 4/5 4 right/left  4+/5 left/right  Hip abduction 3+/5 3+/5 4- right/left  4+/5 left/right  Hip external rotation 4/5 4/5    Knee flexion 5/5 5/5    Knee extension 4/5 5/5    Ankle dorsiflexion 4/5 5/5 4+ right  4+/5 right  Ankle plantarflexion 2/5 3/5    Ankle inversion 5/5 5/5    Ankle eversion 5/5 5/5      PALPATION:   General  no tenderness                      TODAY'S TREATMENT  10/31: Nu-step L4 5 min Sit to stand from folded mats under front mat table with UE support Sit to stand from footstool with UE support Lateral band walks  (green loop) x 2 laps (10 steps) Seated clam with green loop x 20 Hook lying clam with green loop x 20 Bridge with clam 3 x 10 Sidelying clam with green loop 2 x 10 both Supine SLR 2 x 10 with 2 lb ankle weight Prone hip extension 2 x 10 with 2 lb ankle weight Therapeutic activity:  squatting to  lift grandson, standing, walking  10/26: Nu-step L4 5 min 5x sit to stand TUG Squats at the sink 5x Discussed sit to stand with weight when hemoglobin improved Discussed sitting on a stool in front of sink when hemoglobin improved Standing heel raises 3 ways: straight, toes in, toes out 5x each Lateral band walks at back counter (green  loop) x 4 laps Step taps 10x alternating LAQ red band 10x right/left 3# weight on knee lift up and over low cone 10x right/left Therapeutic activity:  squatting to lift grandson, standing, walking  10/5: Nu-step L4 5 min Standing heel raises 3 ways: straight, toes in, toes out 5x each Lateral band walks at back counter (green loop) x 4 laps Up and over 4 inch step laterally x 20 (1 is 1) Marching on balance pad x 20 without UE support Sit to stand 2x 10 mat table Seated LAQ with 5 # 2 x 10 Seated green band HS curls 20x right/left Seated piriformis stretch 30 sec 2x  each side Seated up and over hurdle x 20 each LE (left off 5# due to fatigue) Seated neural floss 20x right/left Seated green band plantarflexion 20x right/left  Seated ankle eversion with green band 20x right/left Seated ankle dorsiflexion green band 20x right/left  PATIENT EDUCATION:  Education details: education on vaginal moisturizers and lubricants. Handed patient samples of each.  Person educated: Patient Education method: Theatre stage manager Education comprehension: verbalized understanding   HOME EXERCISE PROGRAM: Access Code: [email or web address] URL: [email or web address] Date: 02/04/2022 Prepared by: Ruben Im  Exercises - Seated Hip External Rotation Stretch  - 1 x daily - 7 x weekly - 1 sets - 3 reps - 30 hold - Seated Slump Nerve Glide  - 1 x daily - 7 x weekly - 1 sets - 5 reps - Seated March  - 1 x daily - 7 x weekly - 1 sets - 10 reps - Sit to Stand  - 1 x daily - 7 x weekly - 1 sets - 10 reps - Seated Hip Abduction with Resistance  - 1 x daily - 7 x weekly - 1 sets - 10 reps - Forward Step Up with Counter Support  - 1 x daily - 7 x weekly - 1 sets - 10 reps - Seated Hip Flexion March with Ankle Weights  - 1 x daily - 7 x weekly - 10 reps - Seated Knee Extension with Resistance  - 1 x daily - 7 x weekly - 1 sets - 10 reps Given red band for home  ASSESSMENT:  CLINICAL  IMPRESSION: Rhonda Richardson has gained significant strength in her legs but her endurance is limited at times.  She was able to complete todays tasks with mod fatigue.  She is taking care of her grand babies and was having some difficulty with getting up from the floor.  We suggested using foot stool and she did very well getting up from the stool today.  She would benefit from continuing skilled PT for LE strength and balance training.     OBJECTIVE IMPAIRMENTS Abnormal gait, decreased activity tolerance, decreased balance, decreased coordination, decreased endurance, difficulty walking, and decreased strength.   ACTIVITY LIMITATIONS lifting, bending, squatting, stairs, transfers, and locomotion level  PARTICIPATION LIMITATIONS: cleaning, laundry, shopping, community activity, and occupation  PERSONAL FACTORS Age, Fitness, Past/current experiences, and 1-2 comorbidities: breast cancer and having chemotherapy  are also affecting patient's functional outcome.   REHAB POTENTIAL: Excellent  CLINICAL DECISION MAKING: Evolving/moderate complexity  EVALUATION COMPLEXITY: Moderate  GOALS: Goals reviewed with patient? Yes  SHORT TERM GOALS: Target date: 01/13/2022  Patient is independent with initial HEP for balance and strength.  Baseline: Goal status: goal met 10/26  2.  Patient reports her endurance and steadiness on her right leg has improved >/= 25% the 2 weeks prior to her chemotherapy treatments.  Baseline:  Goal status: met 10/26  3.  Patient educated on vaginal moisturizers and lubricants to promote good vaginal health.  Baseline:  Goal status: MET 12/16/2021  LONG TERM GOALS: Target date: 03/10/2022   Patient independent with advanced HEP for balance and strength to reduce the chance of falls.  Baseline:  Goal status: INITIAL  2.  Sit to stand 5x is </= 12.6 seconds to reduce the chance of her falling.  Baseline:  Goal status: goal met 10/26  3.  Patient able to stand on right  leg for 15 seconds so she is able to go up and down steps with step over step pattern.  Baseline:  Goal status: INITIAL  4.  Tandem stance with right leg for 30 seconds with </= 50% less body swaying due to improved strength and balance.  Baseline:  Goal status: INITIAL  5.  Patient is able to squat and stand up with >/= 50% greater ease due to increased in right lower extremity strength.  Baseline:  Goal status: INITIAL   PLAN: PT FREQUENCY: 2x/week  PT DURATION: 12 weeks  PLANNED INTERVENTIONS: Therapeutic exercises, Therapeutic activity, Neuromuscular re-education, Balance training, Gait training, Patient/Family education, Self Care, and Stair training  PLAN FOR NEXT SESSION:   leg press;  follow up on energy levels (low hemoglobin); review and progress HEP as needed;  work on right lower extremity strength, balance exercises, program will focus on balance strength and endurance while she is getting chemotherapy  Anderson Malta B. Margaux Engen, PT 02/09/22 12:01 PM  Phone: [phone] Fax: [phone]  [place], [place] [address] Phone # [phone] Fax [phone]

## 2022-02-11 ENCOUNTER — Ambulatory Visit: Payer: Medicare Other | Attending: General Surgery

## 2022-02-11 DIAGNOSIS — Z483 Aftercare following surgery for neoplasm: Secondary | ICD-10-CM | POA: Insufficient documentation

## 2022-02-11 DIAGNOSIS — Z9181 History of falling: Secondary | ICD-10-CM | POA: Insufficient documentation

## 2022-02-11 DIAGNOSIS — M6281 Muscle weakness (generalized): Secondary | ICD-10-CM | POA: Insufficient documentation

## 2022-02-11 DIAGNOSIS — R293 Abnormal posture: Secondary | ICD-10-CM | POA: Diagnosis not present

## 2022-02-11 DIAGNOSIS — R252 Cramp and spasm: Secondary | ICD-10-CM | POA: Insufficient documentation

## 2022-02-11 DIAGNOSIS — R262 Difficulty in walking, not elsewhere classified: Secondary | ICD-10-CM | POA: Insufficient documentation

## 2022-02-11 NOTE — Therapy (Signed)
OUTPATIENT PHYSICAL THERAPY PROGRESS NOTE   Patient Name: Rhonda Richardson MRN: [id number] DOB:11-May-1940, 81 y.o., female Today's Date: 02/11/2022  PT End of Session - 02/11/22 1109     Visit Number 11    Date for PT Re-Evaluation 03/10/22    Authorization Type Medicare    Progress Note Due on Visit 20    PT Start Time 1100    PT Stop Time 1143    PT Time Calculation (min) 43 min    Activity Tolerance Patient tolerated treatment well    Behavior During Therapy WFL for tasks assessed/performed                  Past Medical History:  Diagnosis Date   Breast cancer ([place])    Hypertension    Past Surgical History:  Procedure Laterality Date   BREAST LUMPECTOMY WITH RADIOACTIVE SEED AND SENTINEL LYMPH NODE BIOPSY Right 09/15/2021   Procedure: RIGHT BREAST SEED BRACKETED LUMPECTOMY AND SENTINEL NODE BIOPSY;  Surgeon: [name], MD;  Location: [place];  Service: General;  Laterality: Right;   cataract surgery Bilateral    DILATION AND CURETTAGE OF UTERUS  1970   EXCISION OF BREAST LESION Right 10/15/2021   Procedure: ASPIRATION RIGHT AXILLARY SEROMA;  Surgeon: [name], MD;  Location: [place];  Service: General;  Laterality: Right;   eyelid tendon repair Bilateral    PORTACATH PLACEMENT N/A 09/15/2021   Procedure: PORT PLACEMENT;  Surgeon: [name], MD;  Location: [place];  Service: General;  Laterality: N/A;   RE-EXCISION OF BREAST LUMPECTOMY Right 10/15/2021   Procedure: RE-EXCISION LUMPECTOMY RIGHT BREAST;  Surgeon: [name], MD;  Location: [place];  Service: General;  Laterality: Right;   Patient Active Problem List   Diagnosis Date Noted   Port-A-Cath in place 11/18/2021   Genetic testing 09/28/2021   Malignant neoplasm of upper-outer quadrant of right breast in female, estrogen receptor positive ([place]) 09/08/2021    PCP: Kelton Pillar, MD  REFERRING PROVIDER: Benay Pike, MD  REFERRING DIAG: M62.89 (ICD-10-CM) -  Pelvic floor dysfunction  THERAPY DIAG:  Aftercare following surgery for neoplasm  Muscle weakness (generalized)  History of falling  Difficulty in walking, not elsewhere classified  Abnormal posture  Cramp and spasm  Rationale for Evaluation and Treatment Rehabilitation  ONSET DATE: 09/15/2021  SUBJECTIVE:                                                                                                                                                                                           SUBJECTIVE STATEMENT: Feeling pretty good today.  Not much change in my  feet.  Maybe a little better today.  Still not sure if I will be having my last chemo.      PAIN:  Are you having pain? 0/10  touching lower leg feels different   PRECAUTIONS: Other: Breast cancer  WEIGHT BEARING RESTRICTIONS No  FALLS:  Has patient fallen in last 6 months? Yes on 11/13/2021 she fell after the chemotherapy treatment due to sciatica.  LIVING ENVIRONMENT: Lives with: lives with their spouse  OCCUPATION: part time  PLOF: Independent  PATIENT GOALS pelvic floor education  PERTINENT HISTORY:  Patient was diagnosed on 08/12/2021 with right grade 2 invasive ductal carcinoma breast cancer. She had a right lumpectomy and sentinel node biopsy (0/4 nodes positive) on 09/15/2021 followed by a re-excision on 10/15/2021. It is triple positive with a Ki67 of 30%. Presently having chemotherapy.   BOWEL MOVEMENT Pain with bowel movement: No  URINATION Pain with urination: No Fully empty bladder: No  INTERCOURSE Pain with intercourse:  none and uses lubricant  PREGNANCY Vaginal deliveries 3 Tearing No C-section deliveries 0 Currently pregnant No  Progress Note Reporting Period 12/16/21 to 02/09/22  See note below for Objective Data and Assessment of Progress/Goals.       OBJECTIVE:   DIAGNOSTIC FINDINGS:  none   COGNITION:  Overall cognitive status: Within functional limits for tasks  assessed     SENSATION:  Light touch: Appears intact on left, decreased on lateral and medial right lower leg  Proprioception: Appears intact                POSTURE: No Significant postural limitations   Sit to stand 5 times 15 seconds.  TUG 11 sec Stand on right leg 3 sec Stand on left leg 15 sec with no issues Tandem stance with left leg in front 30 sec Tandem stance with right leg in front  30 sec with increased body sway   Recheck 10/26:  5x sit to stand:  12 sec                             TUG:  7 sec    PELVIC ALIGNMENT:  LUMBARAROM/PROM  A/PROM A/PROM  eval 9/21  Extension Decreased by 25% WFLs    LOWER EXTREMITY [id number] hip ROM is full  LOWER EXTREMITY MMT:  MMT Right eval Left eval 9/21 10/31  Hip flexion 4/5 5/5    Hip extension 4/5 4/5 4 right/left  4+/5 left/right  Hip abduction 3+/5 3+/5 4- right/left  4+/5 left/right  Hip external rotation 4/5 4/5    Knee flexion 5/5 5/5    Knee extension 4/5 5/5    Ankle dorsiflexion 4/5 5/5 4+ right  4+/5 right  Ankle plantarflexion 2/5 3/5    Ankle inversion 5/5 5/5    Ankle eversion 5/5 5/5      PALPATION:   General  no tenderness                      TODAY'S TREATMENT  11/01: Nu-step L4 5 min Step up and hold on balance pad x 10 each LE fwd then lateral March on balance pad x 20 Squats on balance pad 2 x 10 Step up and over balance pad x 20 Cone touches standing on floor (cone on counter at back of gym) 3 x 10 each LE Standing tap ups on cone alternating feet x 20 Obstacle course using 5 hurdles, 2 cobble pads and one  balance pad: 2 laps of fwd step to pattern, 2 laps of side step step to pattern, then 4 laps of reciprocal pattern.   Standing on balance pad 1 D ball toss: chest pass in slight knee flexion x 20,  chop toss x 20 in slight knee flexion (green plyo ball) Trunk rotation at Matrix with 5 lb x 10 each side  10/31: Nu-step L4 5 min Sit to stand from folded mats under front mat table  with UE support Sit to stand from footstool with UE support Lateral band walks  (green loop) x 2 laps (10 steps) Seated clam with green loop x 20 Hook lying clam with green loop x 20 Bridge with clam 3 x 10 Sidelying clam with green loop 2 x 10 both Supine SLR 2 x 10 with 2 lb ankle weight Prone hip extension 2 x 10 with 2 lb ankle weight Therapeutic activity:  squatting to lift grandson, standing, walking  10/26: Nu-step L4 5 min 5x sit to stand TUG Squats at the sink 5x Discussed sit to stand with weight when hemoglobin improved Discussed sitting on a stool in front of sink when hemoglobin improved Standing heel raises 3 ways: straight, toes in, toes out 5x each Lateral band walks at back counter (green loop) x 4 laps Step taps 10x alternating LAQ red band 10x right/left 3# weight on knee lift up and over low cone 10x right/left Therapeutic activity:  squatting to lift grandson, standing, walking   PATIENT EDUCATION:  Education details: education on vaginal moisturizers and lubricants. Handed patient samples of each.  Person educated: Patient Education method: Theatre stage manager Education comprehension: verbalized understanding   HOME EXERCISE PROGRAM: Access Code: [email or web address] URL: [email or web address] Date: 02/04/2022 Prepared by: Ruben Im  Exercises - Seated Hip External Rotation Stretch  - 1 x daily - 7 x weekly - 1 sets - 3 reps - 30 hold - Seated Slump Nerve Glide  - 1 x daily - 7 x weekly - 1 sets - 5 reps - Seated March  - 1 x daily - 7 x weekly - 1 sets - 10 reps - Sit to Stand  - 1 x daily - 7 x weekly - 1 sets - 10 reps - Seated Hip Abduction with Resistance  - 1 x daily - 7 x weekly - 1 sets - 10 reps - Forward Step Up with Counter Support  - 1 x daily - 7 x weekly - 1 sets - 10 reps - Seated Hip Flexion March with Ankle Weights  - 1 x daily - 7 x weekly - 10 reps - Seated Knee Extension with Resistance  - 1 x daily - 7 x weekly - 1  sets - 10 reps Given red band for home  ASSESSMENT:  CLINICAL IMPRESSION: Haddy demonstrates good muscle memory from her prior level of workouts and balance training.  She was able to do some higher level balance activities today with only 1-2 losses of balance through all tasks.  She should continue to do well.   She would benefit from continuing skilled PT for LE strength and balance training.     OBJECTIVE IMPAIRMENTS Abnormal gait, decreased activity tolerance, decreased balance, decreased coordination, decreased endurance, difficulty walking, and decreased strength.   ACTIVITY LIMITATIONS lifting, bending, squatting, stairs, transfers, and locomotion level  PARTICIPATION LIMITATIONS: cleaning, laundry, shopping, community activity, and occupation  PERSONAL FACTORS Age, Fitness, Past/current experiences, and 1-2 comorbidities: breast cancer and having chemotherapy  are also affecting patient's functional  outcome.   REHAB POTENTIAL: Excellent  CLINICAL DECISION MAKING: Evolving/moderate complexity  EVALUATION COMPLEXITY: Moderate   GOALS: Goals reviewed with patient? Yes  SHORT TERM GOALS: Target date: 01/13/2022  Patient is independent with initial HEP for balance and strength.  Baseline: Goal status: goal met 10/26  2.  Patient reports her endurance and steadiness on her right leg has improved >/= 25% the 2 weeks prior to her chemotherapy treatments.  Baseline:  Goal status: met 10/26  3.  Patient educated on vaginal moisturizers and lubricants to promote good vaginal health.  Baseline:  Goal status: MET 12/16/2021  LONG TERM GOALS: Target date: 03/10/2022   Patient independent with advanced HEP for balance and strength to reduce the chance of falls.  Baseline:  Goal status: INITIAL  2.  Sit to stand 5x is </= 12.6 seconds to reduce the chance of her falling.  Baseline:  Goal status: goal met 10/26  3.  Patient able to stand on right leg for 15 seconds so she is  able to go up and down steps with step over step pattern.  Baseline:  Goal status: INITIAL  4.  Tandem stance with right leg for 30 seconds with </= 50% less body swaying due to improved strength and balance.  Baseline:  Goal status: INITIAL  5.  Patient is able to squat and stand up with >/= 50% greater ease due to increased in right lower extremity strength.  Baseline:  Goal status: INITIAL   PLAN: PT FREQUENCY: 2x/week  PT DURATION: 12 weeks  PLANNED INTERVENTIONS: Therapeutic exercises, Therapeutic activity, Neuromuscular re-education, Balance training, Gait training, Patient/Family education, Self Care, and Stair training  PLAN FOR NEXT SESSION:   leg press;  follow up on energy levels (low hemoglobin); review and progress HEP as needed;  work on right lower extremity strength, balance exercises, program will focus on balance strength and endurance while she is getting chemotherapy  Anderson Malta B. Ravon Mortellaro, PT 02/11/22 1:30 PM   Phone: [phone] Fax: [phone]  [place], [place] [address] Phone # [phone] Fax [phone]

## 2022-02-16 ENCOUNTER — Ambulatory Visit: Payer: Medicare Other

## 2022-02-16 DIAGNOSIS — M6281 Muscle weakness (generalized): Secondary | ICD-10-CM | POA: Diagnosis not present

## 2022-02-16 DIAGNOSIS — R252 Cramp and spasm: Secondary | ICD-10-CM

## 2022-02-16 DIAGNOSIS — R293 Abnormal posture: Secondary | ICD-10-CM

## 2022-02-16 DIAGNOSIS — Z9181 History of falling: Secondary | ICD-10-CM | POA: Diagnosis not present

## 2022-02-16 DIAGNOSIS — R262 Difficulty in walking, not elsewhere classified: Secondary | ICD-10-CM | POA: Diagnosis not present

## 2022-02-16 DIAGNOSIS — Z483 Aftercare following surgery for neoplasm: Secondary | ICD-10-CM

## 2022-02-16 NOTE — Therapy (Signed)
OUTPATIENT PHYSICAL THERAPY PROGRESS NOTE   Patient Name: Rhonda Richardson MRN: [id number] DOB:Jul 17, 1940, 81 y.o., female Today's Date: 02/16/2022  PT End of Session - 02/16/22 1111     Visit Number 12    Date for PT Re-Evaluation 03/10/22    Authorization Type Medicare    Progress Note Due on Visit 20    PT Start Time 1105    PT Stop Time 1146    PT Time Calculation (min) 41 min    Activity Tolerance Patient tolerated treatment well    Behavior During Therapy WFL for tasks assessed/performed                  Past Medical History:  Diagnosis Date   Breast cancer ([place])    Hypertension    Past Surgical History:  Procedure Laterality Date   BREAST LUMPECTOMY WITH RADIOACTIVE SEED AND SENTINEL LYMPH NODE BIOPSY Right 09/15/2021   Procedure: RIGHT BREAST SEED BRACKETED LUMPECTOMY AND SENTINEL NODE BIOPSY;  Surgeon: [name], MD;  Location: [place];  Service: General;  Laterality: Right;   cataract surgery Bilateral    DILATION AND CURETTAGE OF UTERUS  1970   EXCISION OF BREAST LESION Right 10/15/2021   Procedure: ASPIRATION RIGHT AXILLARY SEROMA;  Surgeon: [name], MD;  Location: [place];  Service: General;  Laterality: Right;   eyelid tendon repair Bilateral    PORTACATH PLACEMENT N/A 09/15/2021   Procedure: PORT PLACEMENT;  Surgeon: [name], MD;  Location: [place];  Service: General;  Laterality: N/A;   RE-EXCISION OF BREAST LUMPECTOMY Right 10/15/2021   Procedure: RE-EXCISION LUMPECTOMY RIGHT BREAST;  Surgeon: [name], MD;  Location: [facility];  Service: General;  Laterality: Right;   Patient Active Problem List   Diagnosis Date Noted   Port-A-Cath in place 11/18/2021   Genetic testing 09/28/2021   Malignant neoplasm of upper-outer quadrant of right breast in female, estrogen receptor positive ([place]) 09/08/2021    PCP: Kelton Pillar, MD  REFERRING PROVIDER: Benay Pike, MD  REFERRING DIAG: M62.89 (ICD-10-CM) -  Pelvic floor dysfunction  THERAPY DIAG:  Aftercare following surgery for neoplasm  Muscle weakness (generalized)  History of falling  Difficulty in walking, not elsewhere classified  Abnormal posture  Cramp and spasm  Rationale for Evaluation and Treatment Rehabilitation  ONSET DATE: 09/15/2021  SUBJECTIVE:                                                                                                                                                                                           SUBJECTIVE STATEMENT: Patient had no new complaints.  Last chemo next week  but may not have to do it if numbers look good.       PAIN:  Are you having pain? 0/10  touching lower leg feels different   PRECAUTIONS: Other: Breast cancer  WEIGHT BEARING RESTRICTIONS No  FALLS:  Has patient fallen in last 6 months? Yes on 11/13/2021 she fell after the chemotherapy treatment due to sciatica.  LIVING ENVIRONMENT: Lives with: lives with their spouse  OCCUPATION: part time  PLOF: Independent  PATIENT GOALS pelvic floor education  PERTINENT HISTORY:  Patient was diagnosed on 08/12/2021 with right grade 2 invasive ductal carcinoma breast cancer. She had a right lumpectomy and sentinel node biopsy (0/4 nodes positive) on 09/15/2021 followed by a re-excision on 10/15/2021. It is triple positive with a Ki67 of 30%. Presently having chemotherapy.   BOWEL MOVEMENT Pain with bowel movement: No  URINATION Pain with urination: No Fully empty bladder: No  INTERCOURSE Pain with intercourse:  none and uses lubricant  PREGNANCY Vaginal deliveries 3 Tearing No C-section deliveries 0 Currently pregnant No  Progress Note Reporting Period 12/16/21 to 02/09/22  See note below for Objective Data and Assessment of Progress/Goals.       OBJECTIVE:   DIAGNOSTIC FINDINGS:  none   COGNITION:  Overall cognitive status: Within functional limits for tasks assessed     SENSATION:  Light touch: Appears  intact on left, decreased on lateral and medial right lower leg  Proprioception: Appears intact                POSTURE: No Significant postural limitations   Sit to stand 5 times 15 seconds.  TUG 11 sec Stand on right leg 3 sec Stand on left leg 15 sec with no issues Tandem stance with left leg in front 30 sec Tandem stance with right leg in front  30 sec with increased body sway   Recheck 10/26:  5x sit to stand:  12 sec                             TUG:  7 sec    PELVIC ALIGNMENT:  LUMBARAROM/PROM  A/PROM A/PROM  eval 9/21  Extension Decreased by 25% WFLs    LOWER EXTREMITY [id number] hip ROM is full  LOWER EXTREMITY MMT:  MMT Right eval Left eval 9/21 10/31  Hip flexion 4/5 5/5    Hip extension 4/5 4/5 4 right/left  4+/5 left/right  Hip abduction 3+/5 3+/5 4- right/left  4+/5 left/right  Hip external rotation 4/5 4/5    Knee flexion 5/5 5/5    Knee extension 4/5 5/5    Ankle dorsiflexion 4/5 5/5 4+ right  4+/5 right  Ankle plantarflexion 2/5 3/5    Ankle inversion 5/5 5/5    Ankle eversion 5/5 5/5      PALPATION:   General  no tenderness                      TODAY'S TREATMENT  11/07: Nu-step L4 5 min Step up and hold on balance pad x 10 each LE fwd then lateral March on balance pad x 20 Squats on balance pad 2 x 10 Step up and over balance pad x 20 Cone touches standing on floor (cone on mat table) 3 x 10 each LE Standing on floor, alternating tap ups on mat table Lunge fwd and lateral x 10 each LE on BOSU Squat to pick up 10 lb kb worked diligently on  equilateral weight shift and proper technique 10 reps  Standing tap ups on cone alternating feet x 20 Trunk rotation at Matrix with 5 lb x 10 each side  11/01: Nu-step L4 5 min Step up and hold on balance pad x 10 each LE fwd then lateral March on balance pad x 20 Squats on balance pad 2 x 10 Step up and over balance pad x 20 Cone touches standing on floor (cone on counter at back of gym) 3 x 10  each LE Standing tap ups on cone alternating feet x 20 Obstacle course using 5 hurdles, 2 cobble pads and one balance pad: 2 laps of fwd step to pattern, 2 laps of side step step to pattern, then 4 laps of reciprocal pattern.   Standing on balance pad 1 D ball toss: chest pass in slight knee flexion x 20,  chop toss x 20 in slight knee flexion (green plyo ball) Trunk rotation at Matrix with 5 lb x 10 each side  10/31: Nu-step L4 5 min Sit to stand from folded mats under front mat table with UE support Sit to stand from footstool with UE support Lateral band walks  (green loop) x 2 laps (10 steps) Seated clam with green loop x 20 Hook lying clam with green loop x 20 Bridge with clam 3 x 10 Sidelying clam with green loop 2 x 10 both Supine SLR 2 x 10 with 2 lb ankle weight Prone hip extension 2 x 10 with 2 lb ankle weight Therapeutic activity:  squatting to lift grandson, standing, walking   PATIENT EDUCATION:  Education details: education on vaginal moisturizers and lubricants. Handed patient samples of each.  Person educated: Patient Education method: Theatre stage manager Education comprehension: verbalized understanding   HOME EXERCISE PROGRAM: Access Code: [email or web address] URL: [email or web address] Date: 02/04/2022 Prepared by: Ruben Im  Exercises - Seated Hip External Rotation Stretch  - 1 x daily - 7 x weekly - 1 sets - 3 reps - 30 hold - Seated Slump Nerve Glide  - 1 x daily - 7 x weekly - 1 sets - 5 reps - Seated March  - 1 x daily - 7 x weekly - 1 sets - 10 reps - Sit to Stand  - 1 x daily - 7 x weekly - 1 sets - 10 reps - Seated Hip Abduction with Resistance  - 1 x daily - 7 x weekly - 1 sets - 10 reps - Forward Step Up with Counter Support  - 1 x daily - 7 x weekly - 1 sets - 10 reps - Seated Hip Flexion March with Ankle Weights  - 1 x daily - 7 x weekly - 10 reps - Seated Knee Extension with Resistance  - 1 x daily - 7 x weekly - 1 sets - 10  reps Given red band for home  ASSESSMENT:  CLINICAL IMPRESSION: Vianey continues to show excellent tolerance to balance activities and strengthening.  She is very determined and focused on her rehab.  She pushed herself a lot on the squat to pick up today and seemed to have been a little sore at end of session.  Instructed her to practice at home but maybe put target that she is picking up on a foot stool to allow for her to do proper body mechanics.  She should continue to do well.    She would benefit from continuing skilled PT for LE strength and balance training.     OBJECTIVE IMPAIRMENTS Abnormal gait,  decreased activity tolerance, decreased balance, decreased coordination, decreased endurance, difficulty walking, and decreased strength.   ACTIVITY LIMITATIONS lifting, bending, squatting, stairs, transfers, and locomotion level  PARTICIPATION LIMITATIONS: cleaning, laundry, shopping, community activity, and occupation  PERSONAL FACTORS Age, Fitness, Past/current experiences, and 1-2 comorbidities: breast cancer and having chemotherapy  are also affecting patient's functional outcome.   REHAB POTENTIAL: Excellent  CLINICAL DECISION MAKING: Evolving/moderate complexity  EVALUATION COMPLEXITY: Moderate   GOALS: Goals reviewed with patient? Yes  SHORT TERM GOALS: Target date: 01/13/2022  Patient is independent with initial HEP for balance and strength.  Baseline: Goal status: goal met 10/26  2.  Patient reports her endurance and steadiness on her right leg has improved >/= 25% the 2 weeks prior to her chemotherapy treatments.  Baseline:  Goal status: met 10/26  3.  Patient educated on vaginal moisturizers and lubricants to promote good vaginal health.  Baseline:  Goal status: MET 12/16/2021  LONG TERM GOALS: Target date: 03/10/2022   Patient independent with advanced HEP for balance and strength to reduce the chance of falls.  Baseline:  Goal status: INITIAL  2.  Sit to  stand 5x is </= 12.6 seconds to reduce the chance of her falling.  Baseline:  Goal status: goal met 10/26  3.  Patient able to stand on right leg for 15 seconds so she is able to go up and down steps with step over step pattern.  Baseline:  Goal status: INITIAL  4.  Tandem stance with right leg for 30 seconds with </= 50% less body swaying due to improved strength and balance.  Baseline:  Goal status: INITIAL  5.  Patient is able to squat and stand up with >/= 50% greater ease due to increased in right lower extremity strength.  Baseline:  Goal status: INITIAL   PLAN: PT FREQUENCY: 2x/week  PT DURATION: 12 weeks  PLANNED INTERVENTIONS: Therapeutic exercises, Therapeutic activity, Neuromuscular re-education, Balance training, Gait training, Patient/Family education, Self Care, and Stair training  PLAN FOR NEXT SESSION:   leg press;  follow up on energy levels (low hemoglobin); review and progress HEP as needed;  work on right lower extremity strength, balance exercises, program will focus on balance strength and endurance while she is getting chemotherapy  Anderson Malta B. Amier Hoyt, PT 02/16/22 11:50 AM   Phone: [phone] Fax: [phone]  [place], [place] [address] Phone # [phone] Fax [phone]

## 2022-02-18 ENCOUNTER — Ambulatory Visit: Payer: Medicare Other

## 2022-02-18 DIAGNOSIS — Z9181 History of falling: Secondary | ICD-10-CM | POA: Diagnosis not present

## 2022-02-18 DIAGNOSIS — R252 Cramp and spasm: Secondary | ICD-10-CM | POA: Diagnosis not present

## 2022-02-18 DIAGNOSIS — M6281 Muscle weakness (generalized): Secondary | ICD-10-CM

## 2022-02-18 DIAGNOSIS — Z483 Aftercare following surgery for neoplasm: Secondary | ICD-10-CM | POA: Diagnosis not present

## 2022-02-18 DIAGNOSIS — R262 Difficulty in walking, not elsewhere classified: Secondary | ICD-10-CM

## 2022-02-18 DIAGNOSIS — R293 Abnormal posture: Secondary | ICD-10-CM | POA: Diagnosis not present

## 2022-02-18 NOTE — Therapy (Signed)
OUTPATIENT PHYSICAL THERAPY PROGRESS NOTE   Patient Name: Rhonda Richardson MRN: [id number] DOB:1940-10-24, 81 y.o., female Today's Date: 02/18/2022  PT End of Session - 02/18/22 1111     Visit Number 13    Date for PT Re-Evaluation 03/10/22    Authorization Type Medicare    Progress Note Due on Visit 20    PT Start Time 1100    PT Stop Time 1142    PT Time Calculation (min) 42 min    Activity Tolerance Patient tolerated treatment well    Behavior During Therapy WFL for tasks assessed/performed                  Past Medical History:  Diagnosis Date   Breast cancer ([place])    Hypertension    Past Surgical History:  Procedure Laterality Date   BREAST LUMPECTOMY WITH RADIOACTIVE SEED AND SENTINEL LYMPH NODE BIOPSY Right 09/15/2021   Procedure: RIGHT BREAST SEED BRACKETED LUMPECTOMY AND SENTINEL NODE BIOPSY;  Surgeon: [name], MD;  Location: [facility];  Service: General;  Laterality: Right;   cataract surgery Bilateral    DILATION AND CURETTAGE OF UTERUS  1970   EXCISION OF BREAST LESION Right 10/15/2021   Procedure: ASPIRATION RIGHT AXILLARY SEROMA;  Surgeon: [name], MD;  Location: [facility];  Service: General;  Laterality: Right;   eyelid tendon repair Bilateral    PORTACATH PLACEMENT N/A 09/15/2021   Procedure: PORT PLACEMENT;  Surgeon: [name], MD;  Location: [facility];  Service: General;  Laterality: N/A;   RE-EXCISION OF BREAST LUMPECTOMY Right 10/15/2021   Procedure: RE-EXCISION LUMPECTOMY RIGHT BREAST;  Surgeon: [name], MD;  Location: [facility];  Service: General;  Laterality: Right;   Patient Active Problem List   Diagnosis Date Noted   Port-A-Cath in place 11/18/2021   Genetic testing 09/28/2021   Malignant neoplasm of upper-outer quadrant of right breast in female, estrogen receptor positive ([place]) 09/08/2021    PCP: Kelton Pillar, MD  REFERRING PROVIDER: Benay Pike, MD  REFERRING DIAG: M62.89 (ICD-10-CM) -  Pelvic floor dysfunction  THERAPY DIAG:  Muscle weakness (generalized)  History of falling  Difficulty in walking, not elsewhere classified  Cramp and spasm  Rationale for Evaluation and Treatment Rehabilitation  ONSET DATE: 09/15/2021  SUBJECTIVE:                                                                                                                                                                                           SUBJECTIVE STATEMENT: Patient states she had some glut soreness from the kettle bell pick up after last session.  "It went  away after a day or so"    PAIN:  Are you having pain? 0/10  touching lower leg feels different   PRECAUTIONS: Other: Breast cancer  WEIGHT BEARING RESTRICTIONS No  FALLS:  Has patient fallen in last 6 months? Yes on 11/13/2021 she fell after the chemotherapy treatment due to sciatica.  LIVING ENVIRONMENT: Lives with: lives with their spouse  OCCUPATION: part time  PLOF: Independent  PATIENT GOALS pelvic floor education  PERTINENT HISTORY:  Patient was diagnosed on 08/12/2021 with right grade 2 invasive ductal carcinoma breast cancer. She had a right lumpectomy and sentinel node biopsy (0/4 nodes positive) on 09/15/2021 followed by a re-excision on 10/15/2021. It is triple positive with a Ki67 of 30%. Presently having chemotherapy.   BOWEL MOVEMENT Pain with bowel movement: No  URINATION Pain with urination: No Fully empty bladder: No  INTERCOURSE Pain with intercourse:  none and uses lubricant  PREGNANCY Vaginal deliveries 3 Tearing No C-section deliveries 0 Currently pregnant No  OBJECTIVE:   DIAGNOSTIC FINDINGS:  none   COGNITION:  Overall cognitive status: Within functional limits for tasks assessed     SENSATION:  Light touch: Appears intact on left, decreased on lateral and medial right lower leg  Proprioception: Appears intact                POSTURE: No Significant postural limitations   Sit to  stand 5 times 15 seconds.  TUG 11 sec Stand on right leg 3 sec Stand on left leg 15 sec with no issues Tandem stance with left leg in front 30 sec Tandem stance with right leg in front  30 sec with increased body sway   Recheck 10/26:  5x sit to stand:  12 sec                             TUG:  7 sec    PELVIC ALIGNMENT:  LUMBARAROM/PROM  A/PROM A/PROM  eval 9/21  Extension Decreased by 25% WFLs    LOWER EXTREMITY BOF:BPZWCHENI hip ROM is full  LOWER EXTREMITY MMT:  MMT Right eval Left eval 9/21 10/31  Hip flexion 4/5 5/5    Hip extension 4/5 4/5 4 right/left  4+/5 left/right  Hip abduction 3+/5 3+/5 4- right/left  4+/5 left/right  Hip external rotation 4/5 4/5    Knee flexion 5/5 5/5    Knee extension 4/5 5/5    Ankle dorsiflexion 4/5 5/5 4+ right  4+/5 right  Ankle plantarflexion 2/5 3/5    Ankle inversion 5/5 5/5    Ankle eversion 5/5 5/5      PALPATION:   General  no tenderness                      TODAY'S TREATMENT  11/07: Nu-step L4 5 min Sit to stand from mat table 2 x 10 Step up and hold on balance pad x 10 each LE fwd then lateral March on balance pad x 20 Squats on balance pad 2 x 10 Step up and over balance pad x 20 (added 2nd balance pad today) Cone touches standing on floor (cone on mat table) 3 x 10 each LE Standing on floor, alternating tap ups on mat table (added balance) x 20 Lunge fwd and lateral x 10 each LE on BOSU Squat to pick up 10 lb kb from footstool (modified from last visit)  Trunk rotation at Matrix with 5 lb  2 x  10 each side  11/01: Nu-step L4 5 min Step up and hold on balance pad x 10 each LE fwd then lateral March on balance pad x 20 Squats on balance pad 2 x 10 Step up and over balance pad x 20 Cone touches standing on floor (cone on counter at back of gym) 3 x 10 each LE Standing tap ups on cone alternating feet x 20 Obstacle course using 5 hurdles, 2 cobble pads and one balance pad: 2 laps of fwd step to pattern, 2 laps  of side step step to pattern, then 4 laps of reciprocal pattern.   Standing on balance pad 1 D ball toss: chest pass in slight knee flexion x 20,  chop toss x 20 in slight knee flexion (green plyo ball) Trunk rotation at Matrix with 5 lb x 10 each side  10/31: Nu-step L4 5 min Sit to stand from folded mats under front mat table with UE support Sit to stand from footstool with UE support Lateral band walks  (green loop) x 2 laps (10 steps) Seated clam with green loop x 20 Hook lying clam with green loop x 20 Bridge with clam 3 x 10 Sidelying clam with green loop 2 x 10 both Supine SLR 2 x 10 with 2 lb ankle weight Prone hip extension 2 x 10 with 2 lb ankle weight Therapeutic activity:  squatting to lift grandson, standing, walking   PATIENT EDUCATION:  Education details: education on vaginal moisturizers and lubricants. Handed patient samples of each.  Person educated: Patient Education method: Theatre stage manager Education comprehension: verbalized understanding   HOME EXERCISE PROGRAM: Access Code: [email or web address] URL: [email or web address] Date: 02/04/2022 Prepared by: Ruben Im  Exercises - Seated Hip External Rotation Stretch  - 1 x daily - 7 x weekly - 1 sets - 3 reps - 30 hold - Seated Slump Nerve Glide  - 1 x daily - 7 x weekly - 1 sets - 5 reps - Seated March  - 1 x daily - 7 x weekly - 1 sets - 10 reps - Sit to Stand  - 1 x daily - 7 x weekly - 1 sets - 10 reps - Seated Hip Abduction with Resistance  - 1 x daily - 7 x weekly - 1 sets - 10 reps - Forward Step Up with Counter Support  - 1 x daily - 7 x weekly - 1 sets - 10 reps - Seated Hip Flexion March with Ankle Weights  - 1 x daily - 7 x weekly - 10 reps - Seated Knee Extension with Resistance  - 1 x daily - 7 x weekly - 1 sets - 10 reps Given red band for home  ASSESSMENT:  CLINICAL IMPRESSION: Emanuela continues to progress appropriately.  She is doing some high level balance tasks with min LOB.   She has some difficulty with hip mobility in deeper squat to pick up but responds well to verbal cues.      She would benefit from continuing skilled PT for LE strength and balance training.     OBJECTIVE IMPAIRMENTS Abnormal gait, decreased activity tolerance, decreased balance, decreased coordination, decreased endurance, difficulty walking, and decreased strength.   ACTIVITY LIMITATIONS lifting, bending, squatting, stairs, transfers, and locomotion level  PARTICIPATION LIMITATIONS: cleaning, laundry, shopping, community activity, and occupation  PERSONAL FACTORS Age, Fitness, Past/current experiences, and 1-2 comorbidities: breast cancer and having chemotherapy  are also affecting patient's functional outcome.   REHAB POTENTIAL: Excellent  CLINICAL DECISION MAKING: Evolving/moderate complexity  EVALUATION COMPLEXITY: Moderate   GOALS: Goals reviewed with patient? Yes  SHORT TERM GOALS: Target date: 01/13/2022  Patient is independent with initial HEP for balance and strength.  Baseline: Goal status: goal met 10/26  2.  Patient reports her endurance and steadiness on her right leg has improved >/= 25% the 2 weeks prior to her chemotherapy treatments.  Baseline:  Goal status: met 10/26  3.  Patient educated on vaginal moisturizers and lubricants to promote good vaginal health.  Baseline:  Goal status: MET 12/16/2021  LONG TERM GOALS: Target date: 03/10/2022   Patient independent with advanced HEP for balance and strength to reduce the chance of falls.  Baseline:  Goal status: INITIAL  2.  Sit to stand 5x is </= 12.6 seconds to reduce the chance of her falling.  Baseline:  Goal status: goal met 10/26  3.  Patient able to stand on right leg for 15 seconds so she is able to go up and down steps with step over step pattern.  Baseline:  Goal status: INITIAL  4.  Tandem stance with right leg for 30 seconds with </= 50% less body swaying due to improved strength and balance.   Baseline:  Goal status: INITIAL  5.  Patient is able to squat and stand up with >/= 50% greater ease due to increased in right lower extremity strength.  Baseline:  Goal status: INITIAL   PLAN: PT FREQUENCY: 2x/week  PT DURATION: 12 weeks  PLANNED INTERVENTIONS: Therapeutic exercises, Therapeutic activity, Neuromuscular re-education, Balance training, Gait training, Patient/Family education, Self Care, and Stair training  PLAN FOR NEXT SESSION:   leg press;  follow up on energy levels (low hemoglobin); review and progress HEP as needed;  work on right lower extremity strength, balance exercises, program will focus on balance strength and endurance while she is getting chemotherapy  Anderson Malta B. Maydelin Deming, PT 02/18/22 11:42 AM   Phone: [phone] Fax: [phone]  [place], [place] [address] Phone # [phone] Fax [phone]

## 2022-02-19 MED FILL — Fosaprepitant Dimeglumine For IV Infusion 150 MG (Base Eq): INTRAVENOUS | Qty: 5 | Status: AC

## 2022-02-19 MED FILL — Dexamethasone Sodium Phosphate Inj 100 MG/10ML: INTRAMUSCULAR | Qty: 1 | Status: AC

## 2022-02-22 ENCOUNTER — Inpatient Hospital Stay: Payer: Medicare Other | Attending: Hematology and Oncology

## 2022-02-22 ENCOUNTER — Encounter: Payer: Self-pay | Admitting: *Deleted

## 2022-02-22 ENCOUNTER — Encounter: Payer: Self-pay | Admitting: Hematology and Oncology

## 2022-02-22 ENCOUNTER — Inpatient Hospital Stay: Payer: Medicare Other

## 2022-02-22 ENCOUNTER — Inpatient Hospital Stay (HOSPITAL_BASED_OUTPATIENT_CLINIC_OR_DEPARTMENT_OTHER): Payer: Medicare Other | Admitting: Hematology and Oncology

## 2022-02-22 VITALS — BP 150/79 | HR 89 | Temp 97.9°F | Resp 16 | Ht 62.0 in | Wt 139.7 lb

## 2022-02-22 DIAGNOSIS — Z5112 Encounter for antineoplastic immunotherapy: Secondary | ICD-10-CM | POA: Diagnosis not present

## 2022-02-22 DIAGNOSIS — Z95828 Presence of other vascular implants and grafts: Secondary | ICD-10-CM

## 2022-02-22 DIAGNOSIS — Z79899 Other long term (current) drug therapy: Secondary | ICD-10-CM | POA: Insufficient documentation

## 2022-02-22 DIAGNOSIS — Z170 Estrogen receptor positive status [ER+]: Secondary | ICD-10-CM

## 2022-02-22 DIAGNOSIS — Z5111 Encounter for antineoplastic chemotherapy: Secondary | ICD-10-CM | POA: Diagnosis not present

## 2022-02-22 DIAGNOSIS — Z8249 Family history of ischemic heart disease and other diseases of the circulatory system: Secondary | ICD-10-CM | POA: Diagnosis not present

## 2022-02-22 DIAGNOSIS — Z5189 Encounter for other specified aftercare: Secondary | ICD-10-CM | POA: Diagnosis not present

## 2022-02-22 DIAGNOSIS — G629 Polyneuropathy, unspecified: Secondary | ICD-10-CM | POA: Diagnosis not present

## 2022-02-22 DIAGNOSIS — Z8349 Family history of other endocrine, nutritional and metabolic diseases: Secondary | ICD-10-CM | POA: Insufficient documentation

## 2022-02-22 DIAGNOSIS — C50411 Malignant neoplasm of upper-outer quadrant of right female breast: Secondary | ICD-10-CM

## 2022-02-22 DIAGNOSIS — Z881 Allergy status to other antibiotic agents status: Secondary | ICD-10-CM | POA: Diagnosis not present

## 2022-02-22 DIAGNOSIS — Z5181 Encounter for therapeutic drug level monitoring: Secondary | ICD-10-CM | POA: Diagnosis not present

## 2022-02-22 LAB — CMP (CANCER CENTER ONLY)
ALT: 29 U/L (ref 0–44)
AST: 23 U/L (ref 15–41)
Albumin: 4 g/dL (ref 3.5–5.0)
Alkaline Phosphatase: 76 U/L (ref 38–126)
Anion gap: 4 — ABNORMAL LOW (ref 5–15)
BUN: 24 mg/dL — ABNORMAL HIGH (ref 8–23)
CO2: 29 mmol/L (ref 22–32)
Calcium: 9.1 mg/dL (ref 8.9–10.3)
Chloride: 105 mmol/L (ref 98–111)
Creatinine: 0.87 mg/dL (ref 0.44–1.00)
GFR, Estimated: >60 mL/min (ref >60)
Glucose, Bld: 87 mg/dL (ref 70–99)
Potassium: 4.1 mmol/L (ref 3.5–5.1)
Sodium: 138 mmol/L (ref 135–145)
Total Bilirubin: 0.4 mg/dL (ref 0.3–1.2)
Total Protein: 6.5 g/dL (ref 6.5–8.1)

## 2022-02-22 LAB — CBC WITH DIFFERENTIAL (CANCER CENTER ONLY)
Abs Immature Granulocytes: 0.01 10*3/uL (ref 0.00–0.07)
Basophils Absolute: 0 10*3/uL (ref 0.0–0.1)
Basophils Relative: 1 %
Eosinophils Absolute: 0.1 10*3/uL (ref 0.0–0.5)
Eosinophils Relative: 2 %
HCT: 28.3 % — ABNORMAL LOW (ref 36.0–46.0)
Hemoglobin: 9.2 g/dL — ABNORMAL LOW (ref 12.0–15.0)
Immature Granulocytes: 0 %
Lymphocytes Relative: 37 %
Lymphs Abs: 1.4 10*3/uL (ref 0.7–4.0)
MCH: 28.3 pg (ref 26.0–34.0)
MCHC: 32.5 g/dL (ref 30.0–36.0)
MCV: 87.1 fL (ref 80.0–100.0)
Monocytes Absolute: 0.3 10*3/uL (ref 0.1–1.0)
Monocytes Relative: 8 %
Neutro Abs: 1.9 10*3/uL (ref 1.7–7.7)
Neutrophils Relative %: 52 %
Platelet Count: 211 10*3/uL (ref 150–400)
RBC: 3.25 MIL/uL — ABNORMAL LOW (ref 3.87–5.11)
RDW: 16.9 % — ABNORMAL HIGH (ref 11.5–15.5)
WBC Count: 3.7 10*3/uL — ABNORMAL LOW (ref 4.0–10.5)
nRBC: 0 % (ref 0.0–0.2)

## 2022-02-22 MED ORDER — ACETAMINOPHEN 325 MG PO TABS
650.0000 mg | ORAL_TABLET | Freq: Once | ORAL | Status: AC
  Administered 2022-02-22: 650 mg via ORAL
  Filled 2022-02-22: qty 2

## 2022-02-22 MED ORDER — SODIUM CHLORIDE 0.9 % IV SOLN
284.0000 mg | Freq: Once | INTRAVENOUS | Status: AC
  Administered 2022-02-22: 280 mg via INTRAVENOUS
  Filled 2022-02-22: qty 28

## 2022-02-22 MED ORDER — SODIUM CHLORIDE 0.9% FLUSH
10.0000 mL | Freq: Once | INTRAVENOUS | Status: AC
  Administered 2022-02-22: 10 mL

## 2022-02-22 MED ORDER — SODIUM CHLORIDE 0.9 % IV SOLN
10.0000 mg | Freq: Once | INTRAVENOUS | Status: AC
  Administered 2022-02-22: 10 mg via INTRAVENOUS
  Filled 2022-02-22: qty 10

## 2022-02-22 MED ORDER — PALONOSETRON HCL INJECTION 0.25 MG/5ML
0.2500 mg | Freq: Once | INTRAVENOUS | Status: AC
  Administered 2022-02-22: 0.25 mg via INTRAVENOUS
  Filled 2022-02-22: qty 5

## 2022-02-22 MED ORDER — SODIUM CHLORIDE 0.9% FLUSH
10.0000 mL | INTRAVENOUS | Status: DC | PRN
  Administered 2022-02-22: 10 mL

## 2022-02-22 MED ORDER — SODIUM CHLORIDE 0.9 % IV SOLN: Freq: Once | INTRAVENOUS | Status: AC

## 2022-02-22 MED ORDER — TRASTUZUMAB-DKST CHEMO 150 MG IV SOLR
6.0000 mg/kg | Freq: Once | INTRAVENOUS | Status: AC
  Administered 2022-02-22: 378 mg via INTRAVENOUS
  Filled 2022-02-22: qty 18

## 2022-02-22 MED ORDER — DIPHENHYDRAMINE HCL 25 MG PO CAPS
50.0000 mg | ORAL_CAPSULE | Freq: Once | ORAL | Status: AC
  Administered 2022-02-22: 50 mg via ORAL
  Filled 2022-02-22: qty 2

## 2022-02-22 MED ORDER — HEPARIN SOD (PORK) LOCK FLUSH 100 UNIT/ML IV SOLN
500.0000 [IU] | Freq: Once | INTRAVENOUS | Status: AC | PRN
  Administered 2022-02-22: 500 [IU]

## 2022-02-22 MED ORDER — SODIUM CHLORIDE 0.9 % IV SOLN
150.0000 mg | Freq: Once | INTRAVENOUS | Status: AC
  Administered 2022-02-22: 150 mg via INTRAVENOUS
  Filled 2022-02-22: qty 150

## 2022-02-22 NOTE — Progress Notes (Signed)
[facility] PROGRESS NOTE  Patient Care Team: Collene Leyden, MD as PCP - General (Family Medicine) [name], MD as Consulting Physician (General Surgery) Benay Pike, MD as Consulting Physician (Hematology and Oncology) Kyung Rudd, MD as Consulting Physician (Radiation Oncology) Mauro Kaufmann, RN as Oncology Nurse Navigator Rockwell Germany, RN as Oncology Nurse Navigator  CHIEF COMPLAINTS/PURPOSE OF CONSULTATION:  Breast cancer  SUMMARY OF ONCOLOGIC HISTORY: Oncology History  Malignant neoplasm of upper-outer quadrant of right breast in female, estrogen receptor positive ([facility] [facility])  08/12/2021 Mammogram   Mammogram showed possible mass in the right breast.  Diagnostic mammogram showed 2 highly suspicious upper outer right breast masses measuring 1.5 cm in the 9:30 position and 1.4 cm at the 10 o'clock position.  These masses or distortions mammographically spanning a distance of up to 3.5 cm.  No abnormal appearing right axillary lymph nodes.   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  Prognostic showed ER 99% positive strong staining PR 10% positive moderate staining, HER2 positive 3+ and Ki-67 of 30%.   09/08/2021 Initial Diagnosis   Malignant neoplasm of upper-outer quadrant of right breast in female, estrogen receptor positive ([place])   09/15/2021 Pathology Results   She had right breast lumpectomy which showed invasive ductal carcinoma, 3.6 cm in maximal extent involving inferior and medial margins, DCIS approaching to less than 0.1 cm of closest margin no metastatic carcinoma identified in 4 out of 4 lymph nodes.  She is scheduled for repeat surgery given positive margins.  Prior prognostic showed ER 99% positive strong staining intensity PR 10% positive moderate staining intensity, HER2 positive.   09/23/2021 Genetic Testing   Negative hereditary cancer genetic testing: no pathogenic variants  detected in [facility] CustomNext-cancer +RNAinsight Panel.  Report date is September 23, 2021.   The CustomNext-Cancer+RNAinsight panel offered by [facility] includes sequencing and rearrangement analysis for the following 47 genes:  APC, ATM, AXIN2, BARD1, BMPR1A, BRCA1, BRCA2, BRIP1, CDH1, CDK4, CDKN2A, CHEK2, DICER1, EPCAM, GREM1, HOXB13, MEN1, MLH1, MSH2, MSH3, MSH6, MUTYH, NBN, NF1, NF2, NTHL1, PALB2, PMS2, POLD1, POLE, PTEN, RAD51C, RAD51D, RECQL, RET, SDHA, SDHAF2, SDHB, SDHC, SDHD, SMAD4, SMARCA4, STK11, TP53, TSC1, TSC2, and VHL.  RNA data is routinely analyzed for use in variant interpretation for all genes.   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 DCIS 0.25 mg from true/new margin.   11/10/2021 - 12/02/2021 Chemotherapy   Patient is on Treatment Plan : BREAST Docetaxel + Carboplatin + Trastuzumab ([facility]) q21d / Trastuzumab q21d     11/10/2021 - 12/03/2021 Chemotherapy   Patient is on Treatment Plan : BREAST  Docetaxel + Carboplatin + Trastuzumab + Pertuzumab  (TCHP) q21d      11/10/2021 -  Chemotherapy   Patient is on Treatment Plan : BREAST Docetaxel + Carboplatin + Trastuzumab (TCH) q21d / Trastuzumab q21d      INTERVAL HISTORY:  [name] returns for a follow up visit after receiving Cycle 6 of carboplatin/docetaxel and trastuzumab Patient has been tolerating chemotherapy really well.  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  She was wondering if she can take something for the neuropathy.  She otherwise denies any fevers, chills, nausea, mouth sores, vomiting.  She she had  a few questions once again about the regimen today. Rest of the pertinent 10 point ROS reviewed and negative  MEDICAL HISTORY:  Past Medical History:  Diagnosis Date    Breast cancer ([place])    Hypertension     SURGICAL HISTORY: Past Surgical History:  Procedure Laterality Date   BREAST LUMPECTOMY WITH RADIOACTIVE SEED AND SENTINEL LYMPH NODE BIOPSY Right 09/15/2021   Procedure: RIGHT BREAST SEED BRACKETED LUMPECTOMY AND SENTINEL NODE BIOPSY;  Surgeon: [name], MD;  Location: [facility];  Service: General;  Laterality: Right;   cataract surgery Bilateral    DILATION AND CURETTAGE OF UTERUS  1970   EXCISION OF BREAST LESION Right 10/15/2021   Procedure: ASPIRATION RIGHT AXILLARY SEROMA;  Surgeon: [name], MD;  Location: [facility];  Service: General;  Laterality: Right;   eyelid tendon repair Bilateral    PORTACATH PLACEMENT N/A 09/15/2021   Procedure: PORT PLACEMENT;  Surgeon: [name], MD;  Location: [facility];  Service: General;  Laterality: N/A;   RE-EXCISION OF BREAST LUMPECTOMY Right 10/15/2021   Procedure: RE-EXCISION LUMPECTOMY RIGHT BREAST;  Surgeon: [name], MD;  Location: [facility];  Service: General;  Laterality: Right;    SOCIAL HISTORY: Social History   Socioeconomic History   Marital status: Married    Spouse name: Not on file   Number of children: Not on file   Years of education: Not on file   Highest education level: Not on file  Occupational History   Not on file  Tobacco Use   Smoking status: Never   Smokeless tobacco: Not on file  Vaping Use   Vaping Use: Never used  Substance and Sexual Activity   Alcohol use: Yes    Comment: 3- 4 drinks   Drug use: Never   Sexual activity: Not Currently  Other Topics Concern   Not on file  Social History Narrative   Not on file   Social Determinants of Health   Financial Resource Strain: Low Risk  (09/09/2021)   Overall Financial Resource Strain (CARDIA)    Difficulty of Paying Living Expenses: Not hard at all  Food Insecurity: No Food Insecurity (09/09/2021)   Hunger Vital Sign    Worried About Running Out of Food in the Last Year: Never  true    Ran Out of Food in the Last Year: Never true  Transportation Needs: No Transportation Needs (09/09/2021)   PRAPARE - Hydrologist (Medical): No    Lack of Transportation (Non-Medical): No  Physical Activity: Not on file  Stress: Not on file  Social Connections: Not on file  Intimate Partner Violence: Not on file    FAMILY HISTORY: Family History  Problem Relation Age of Onset   Heart disease Father 47   Thyroid cancer Other 19       niece's daughter    ALLERGIES:  is allergic to macrodantin [nitrofurantoin].  MEDICATIONS:  Current Outpatient Medications  Medication Sig Dispense Refill   acetaminophen (TYLENOL) 325 MG tablet Take 325 mg by mouth every 6 (six) hours as needed for moderate pain.     benazepril (LOTENSIN) 20 MG tablet Take 20 mg by mouth daily.     cholecalciferol (VITAMIN D3) 25 MCG (1000 UNIT) tablet Take 1,000 Units by mouth daily.     famotidine (PEPCID) 20 MG tablet Take 20 mg by mouth daily as needed for heartburn or indigestion.     hydrochlorothiazide (MICROZIDE) 12.5 MG capsule Take 12.5 mg by  mouth daily.     ibuprofen (ADVIL) 200 MG tablet Take 200 mg by mouth every 6 (six) hours as needed for moderate pain.     Magnesium 250 MG TABS Take 250 mg by mouth daily.     Red Yeast Rice 600 MG CAPS Take 600 mg by mouth daily.     traMADol (ULTRAM) 50 MG tablet Take by mouth every 6 (six) hours as needed.     Vitamins-Lipotropics (MULTI-VITAMIN HP/MINERALS PO) Take 1 tablet by mouth daily.     No current facility-administered medications for this visit.    PHYSICAL EXAMINATION: ECOG PERFORMANCE STATUS: 0 - Asymptomatic  Vitals:   02/22/22 0958  BP: (!) 150/79  Pulse: 89  Resp: 16  Temp: 97.9 F (36.6 C)  SpO2: 100%    Filed Weights   02/22/22 0958  Weight: 139 lb 11.2 oz (63.4 kg)   Physical Exam Constitutional:      Appearance: Normal appearance.  Cardiovascular:     Rate and Rhythm: Normal rate and  regular rhythm.  Pulmonary:     Effort: Pulmonary effort is normal.     Breath sounds: Normal breath sounds.  Musculoskeletal:     Cervical back: Normal range of motion and neck supple. No rigidity.  Lymphadenopathy:     Cervical: No cervical adenopathy.  Skin:    General: Skin is warm and dry.  Neurological:     General: No focal deficit present.     Mental Status: She is alert.  Psychiatric:        Mood and Affect: Mood normal.    LABORATORY DATA:  I have reviewed the data as listed Lab Results  Component Value Date   WBC 3.7 (L) 02/22/2022   HGB 9.2 (L) 02/22/2022   HCT 28.3 (L) 02/22/2022   MCV 87.1 02/22/2022   PLT 211 02/22/2022   Lab Results  Component Value Date   NA 138 02/22/2022   K 4.1 02/22/2022   CL 105 02/22/2022   CO2 29 02/22/2022    RADIOGRAPHIC STUDIES: I have personally reviewed the radiological reports and agreed with the findings in the report.  ASSESSMENT AND PLAN:  Rhonda Richardson is a 82 y.o. female who presents for a follow up for right breast cancer.   #Malignant neoplasm of upper-outer quadrant of right breast in female, estrogen receptor positive  --ER/PR positive, HER2 amplified.  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 Media margin showed focal high-grade ductal carcinoma in situ, solid type with necrosis, negative for invasive carcinoma. Inferior and posterior margin negative for carcinoma.  --Given large tumor, triple positive, recommend dose modified TCH for 6 cycles followed by adjuvant herceptin vs taxol with herceptin. -- Last echocardiogram September 28 showed ejection fraction of 60 to 65% -- She will be due for repeat echo in December, this has been ordered today. We will omit docetaxel for cycle 6 and continue with carboplatin and Herceptin, she should also consider  receiving the G-CSF to reduce the risk of neutropenia and neutropenic fever. She will return to clinic in 3 weeks for further recommendations.  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  We have today talked about PEA supplements for neuropathy treatment.  She wants to try the natural route first before she agrees to take more medications.  # BLE edema,  resolved.  RTC in 3 weeks or sooner as needed.  All questions  were answered. The patient knows to call the clinic with any problems, questions or concerns.   I have spent a total of 30 minutes minutes of face-to-face and non-face-to-face time, preparing to see the patient, performing a medically appropriate examination, counseling and educating the patient, documenting clinical information in the electronic health record, and care coordination.

## 2022-02-22 NOTE — Patient Instructions (Addendum)
[facility]COL[facility]  Discharge Instructions: Thank you for choosing [facility] to provide your oncology and hematology care.   If you have a lab appointment with the [facility], please go directly to the [facility] and check in at the registration area.   Wear comfortable clothing and clothing appropriate for easy access to any Portacath or PICC line.   We strive to give you quality time with your provider. You may need to reschedule your appointment if you arrive late (15 or more minutes).  Arriving late affects you and other patients whose appointments are after yours.  Also, if you miss three or more appointments without notifying the office, you may be dismissed from the clinic at the provider's discretion.      For prescription refill requests, have your pharmacy contact our office and allow 72 hours for refills to be completed.    Today you received the following chemotherapy and/or immunotherapy agents: Trastuzumab, and Carboplatin   To help prevent nausea and vomiting after your treatment, we encourage you to take your nausea medication as directed.  BELOW ARE SYMPTOMS THAT SHOULD BE REPORTED IMMEDIATELY: *FEVER GREATER THAN 100.4 F (38 C) OR HIGHER *CHILLS OR SWEATING *NAUSEA AND VOMITING THAT IS NOT CONTROLLED WITH YOUR NAUSEA MEDICATION *UNUSUAL SHORTNESS OF BREATH *UNUSUAL BRUISING OR BLEEDING *URINARY PROBLEMS (pain or burning when urinating, or frequent urination) *BOWEL PROBLEMS (unusual diarrhea, constipation, pain near the anus) TENDERNESS IN MOUTH AND THROAT WITH OR WITHOUT PRESENCE OF ULCERS (sore throat, sores in mouth, or a toothache) UNUSUAL RASH, SWELLING OR PAIN  UNUSUAL VAGINAL DISCHARGE OR ITCHING   Items with * indicate a potential emergency and should be followed up as soon as possible or go to the Emergency Department if any problems should occur.  Please show the CHEMOTHERAPY ALERT CARD or IMMUNOTHERAPY ALERT CARD  at check-in to the Emergency Department and triage nurse.  Should you have questions after your visit or need to cancel or reschedule your appointment, please contact [facility]  Dept: [phone]  and follow the prompts.  Office hours are 8:00 a.m. to 4:30 p.m. Monday - Friday. Please note that voicemails left after 4:00 p.m. may not be returned until the following business day.  We are closed weekends and major holidays. You have access to a nurse at all times for urgent questions. Please call the main number to the clinic Dept: [phone] and follow the prompts.   For any non-urgent questions, you may also contact your provider using MyChart. We now offer e-Visits for anyone 43 and older to request care online for non-urgent symptoms. For details visit [email or web address].   Also download the MyChart app! Go to the app store, search "MyChart", open the app, select [facility], and log in with your MyChart username and password.  Masks are optional in the cancer centers. If you would like for your care team to wear a mask while they are taking care of you, please let them know. You may have one support person who is at least 79 years old accompany you for your appointments.

## 2022-02-23 ENCOUNTER — Other Ambulatory Visit: Payer: Self-pay

## 2022-02-23 ENCOUNTER — Ambulatory Visit: Payer: Medicare Other | Attending: Hematology and Oncology

## 2022-02-23 DIAGNOSIS — R262 Difficulty in walking, not elsewhere classified: Secondary | ICD-10-CM | POA: Diagnosis not present

## 2022-02-23 DIAGNOSIS — Z9181 History of falling: Secondary | ICD-10-CM | POA: Diagnosis not present

## 2022-02-23 DIAGNOSIS — M6281 Muscle weakness (generalized): Secondary | ICD-10-CM | POA: Diagnosis not present

## 2022-02-23 DIAGNOSIS — R252 Cramp and spasm: Secondary | ICD-10-CM | POA: Diagnosis not present

## 2022-02-23 NOTE — Therapy (Signed)
OUTPATIENT PHYSICAL THERAPY PROGRESS NOTE   Patient Name: MAICY FILIP MRN: [id number] DOB:1941-02-23, 81 y.o., female Today's Date: 02/23/2022  PT End of Session - 02/23/22 1112     Visit Number 14    Date for PT Re-Evaluation 03/10/22    Authorization Type Medicare    Progress Note Due on Visit 20    PT Start Time 1104    PT Stop Time 1145    PT Time Calculation (min) 41 min    Activity Tolerance Patient tolerated treatment well    Behavior During Therapy WFL for tasks assessed/performed                  Past Medical History:  Diagnosis Date   Breast cancer ([place])    Hypertension    Past Surgical History:  Procedure Laterality Date   BREAST LUMPECTOMY WITH RADIOACTIVE SEED AND SENTINEL LYMPH NODE BIOPSY Right 09/15/2021   Procedure: RIGHT BREAST SEED BRACKETED LUMPECTOMY AND SENTINEL NODE BIOPSY;  Surgeon: [name], MD;  Location: [facility];  Service: General;  Laterality: Right;   cataract surgery Bilateral    DILATION AND CURETTAGE OF UTERUS  1970   EXCISION OF BREAST LESION Right 10/15/2021   Procedure: ASPIRATION RIGHT AXILLARY SEROMA;  Surgeon: [name], MD;  Location: [facility];  Service: General;  Laterality: Right;   eyelid tendon repair Bilateral    PORTACATH PLACEMENT N/A 09/15/2021   Procedure: PORT PLACEMENT;  Surgeon: [name], MD;  Location: [facility];  Service: General;  Laterality: N/A;   RE-EXCISION OF BREAST LUMPECTOMY Right 10/15/2021   Procedure: RE-EXCISION LUMPECTOMY RIGHT BREAST;  Surgeon: [name], MD;  Location: [facility];  Service: General;  Laterality: Right;   Patient Active Problem List   Diagnosis Date Noted   Port-A-Cath in place 11/18/2021   Genetic testing 09/28/2021   Malignant neoplasm of upper-outer quadrant of right breast in female, estrogen receptor positive ([place]) 09/08/2021    PCP: Kelton Pillar, MD  REFERRING PROVIDER: Benay Pike, MD  REFERRING DIAG: M62.89 (ICD-10-CM) -  Pelvic floor dysfunction  THERAPY DIAG:  Muscle weakness (generalized)  History of falling  Difficulty in walking, not elsewhere classified  Cramp and spasm  Rationale for Evaluation and Treatment Rehabilitation  ONSET DATE: 09/15/2021  SUBJECTIVE:                                                                                                                                                                                           SUBJECTIVE STATEMENT: Patient states she is upset with not getting her immune booster after last chemo.  She was told she  didn't need it but MD did not order that.  Had her last round of chemo and did get the booster this time.  She states she is feeling good today.  "I was able to squat and clean a low shelf"  I've not been able to do that in a long time"  PAIN:  Are you having pain? 0/10  touching lower leg feels different   PRECAUTIONS: Other: Breast cancer  WEIGHT BEARING RESTRICTIONS No  FALLS:  Has patient fallen in last 6 months? Yes on 11/13/2021 she fell after the chemotherapy treatment due to sciatica.  LIVING ENVIRONMENT: Lives with: lives with their spouse  OCCUPATION: part time  PLOF: Independent  PATIENT GOALS pelvic floor education  PERTINENT HISTORY:  Patient was diagnosed on 08/12/2021 with right grade 2 invasive ductal carcinoma breast cancer. She had a right lumpectomy and sentinel node biopsy (0/4 nodes positive) on 09/15/2021 followed by a re-excision on 10/15/2021. It is triple positive with a Ki67 of 30%. Presently having chemotherapy.   BOWEL MOVEMENT Pain with bowel movement: No  URINATION Pain with urination: No Fully empty bladder: No  INTERCOURSE Pain with intercourse:  none and uses lubricant  PREGNANCY Vaginal deliveries 3 Tearing No C-section deliveries 0 Currently pregnant No  OBJECTIVE:   DIAGNOSTIC FINDINGS:  none   COGNITION:  Overall cognitive status: Within functional limits for tasks  assessed     SENSATION:  Light touch: Appears intact on left, decreased on lateral and medial right lower leg  Proprioception: Appears intact                POSTURE: No Significant postural limitations   Sit to stand 5 times 15 seconds.  TUG 11 sec Stand on right leg 3 sec Stand on left leg 15 sec with no issues Tandem stance with left leg in front 30 sec Tandem stance with right leg in front  30 sec with increased body sway   Recheck 10/26:  5x sit to stand:  12 sec                             TUG:  7 sec    PELVIC ALIGNMENT:  LUMBARAROM/PROM  A/PROM A/PROM  eval 9/21  Extension Decreased by 25% WFLs    LOWER EXTREMITY EBR:AXENMMHWK hip ROM is full  LOWER EXTREMITY MMT:  MMT Right eval Left eval 9/21 10/31  Hip flexion 4/5 5/5    Hip extension 4/5 4/5 4 right/left  4+/5 left/right  Hip abduction 3+/5 3+/5 4- right/left  4+/5 left/right  Hip external rotation 4/5 4/5    Knee flexion 5/5 5/5    Knee extension 4/5 5/5    Ankle dorsiflexion 4/5 5/5 4+ right  4+/5 right  Ankle plantarflexion 2/5 3/5    Ankle inversion 5/5 5/5    Ankle eversion 5/5 5/5      PALPATION:   General  no tenderness                      TODAY'S TREATMENT  11/14: Discussion about not getting her immune booster. She was upset that her doctor was not aware that they did not give this to her after last chemo.   Sit to stand from mat table 1 x 10 Squat to table 1 x 10 Step ups 8" 2 x 10 each LE Lateral band walks with blue loop 5 laps along barre Deeper squat to balance pad on  footstool UE use at barre. Step up and hold on balance pad x 10 each LE fwd then lateral March on balance pad x 20 Squats on balance pad 2 x 10 Cone touches standing on floor (cone on mat table) 3 x 10 each LE Trunk rotation at Matrix with 5 lb  2 x 10 each side  11/07: Nu-step L4 5 min Sit to stand from mat table 2 x 10 Step up and hold on balance pad x 10 each LE fwd then lateral March on balance pad x  20 Squats on balance pad 2 x 10 Step up and over balance pad x 20 (added 2nd balance pad today) Cone touches standing on floor (cone on mat table) 3 x 10 each LE Standing on floor, alternating tap ups on mat table (added balance) x 20 Lunge fwd and lateral x 10 each LE on BOSU Squat to pick up 10 lb kb from footstool (modified from last visit)  Trunk rotation at Matrix with 5 lb  2 x 10 each side  11/01: Nu-step L4 5 min Step up and hold on balance pad x 10 each LE fwd then lateral March on balance pad x 20 Squats on balance pad 2 x 10 Step up and over balance pad x 20 Cone touches standing on floor (cone on counter at back of gym) 3 x 10 each LE Standing tap ups on cone alternating feet x 20 Obstacle course using 5 hurdles, 2 cobble pads and one balance pad: 2 laps of fwd step to pattern, 2 laps of side step step to pattern, then 4 laps of reciprocal pattern.   Standing on balance pad 1 D ball toss: chest pass in slight knee flexion x 20,  chop toss x 20 in slight knee flexion (green plyo ball) Trunk rotation at Matrix with 5 lb x 10 each side    PATIENT EDUCATION:  Education details: education on vaginal moisturizers and lubricants. Handed patient samples of each.  Person educated: Patient Education method: Theatre stage manager Education comprehension: verbalized understanding   HOME EXERCISE PROGRAM: Access Code: [email or web address] URL: [email or web address] Date: 02/04/2022 Prepared by: Ruben Im  Exercises - Seated Hip External Rotation Stretch  - 1 x daily - 7 x weekly - 1 sets - 3 reps - 30 hold - Seated Slump Nerve Glide  - 1 x daily - 7 x weekly - 1 sets - 5 reps - Seated March  - 1 x daily - 7 x weekly - 1 sets - 10 reps - Sit to Stand  - 1 x daily - 7 x weekly - 1 sets - 10 reps - Seated Hip Abduction with Resistance  - 1 x daily - 7 x weekly - 1 sets - 10 reps - Forward Step Up with Counter Support  - 1 x daily - 7 x weekly - 1 sets - 10 reps -  Seated Hip Flexion March with Ankle Weights  - 1 x daily - 7 x weekly - 10 reps - Seated Knee Extension with Resistance  - 1 x daily - 7 x weekly - 1 sets - 10 reps Given red band for home  ASSESSMENT:  CLINICAL IMPRESSION: Idania continues to progress appropriately.  She was able to do some deeper squats to work on getting lower to help with routine daily tasks. She is very well motivated and compliant.   She would benefit from continuing skilled PT for LE strength and balance training.     OBJECTIVE IMPAIRMENTS Abnormal  gait, decreased activity tolerance, decreased balance, decreased coordination, decreased endurance, difficulty walking, and decreased strength.   ACTIVITY LIMITATIONS lifting, bending, squatting, stairs, transfers, and locomotion level  PARTICIPATION LIMITATIONS: cleaning, laundry, shopping, community activity, and occupation  PERSONAL FACTORS Age, Fitness, Past/current experiences, and 1-2 comorbidities: breast cancer and having chemotherapy  are also affecting patient's functional outcome.   REHAB POTENTIAL: Excellent  CLINICAL DECISION MAKING: Evolving/moderate complexity  EVALUATION COMPLEXITY: Moderate   GOALS: Goals reviewed with patient? Yes  SHORT TERM GOALS: Target date: 01/13/2022  Patient is independent with initial HEP for balance and strength.  Baseline: Goal status: goal met 10/26  2.  Patient reports her endurance and steadiness on her right leg has improved >/= 25% the 2 weeks prior to her chemotherapy treatments.  Baseline:  Goal status: met 10/26  3.  Patient educated on vaginal moisturizers and lubricants to promote good vaginal health.  Baseline:  Goal status: MET 12/16/2021  LONG TERM GOALS: Target date: 03/10/2022   Patient independent with advanced HEP for balance and strength to reduce the chance of falls.  Baseline:  Goal status: INITIAL  2.  Sit to stand 5x is </= 12.6 seconds to reduce the chance of her falling.  Baseline:   Goal status: goal met 10/26  3.  Patient able to stand on right leg for 15 seconds so she is able to go up and down steps with step over step pattern.  Baseline:  Goal status: INITIAL  4.  Tandem stance with right leg for 30 seconds with </= 50% less body swaying due to improved strength and balance.  Baseline:  Goal status: INITIAL  5.  Patient is able to squat and stand up with >/= 50% greater ease due to increased in right lower extremity strength.  Baseline:  Goal status: INITIAL   PLAN: PT FREQUENCY: 2x/week  PT DURATION: 12 weeks  PLANNED INTERVENTIONS: Therapeutic exercises, Therapeutic activity, Neuromuscular re-education, Balance training, Gait training, Patient/Family education, Self Care, and Stair training  PLAN FOR NEXT SESSION:   leg press;  follow up on energy levels (low hemoglobin); review and progress HEP as needed;  work on right lower extremity strength, balance exercises, program will focus on balance strength and endurance while she is getting chemotherapy  Anderson Malta B. Lamerle Jabs, PT 02/23/22 11:47 AM   Phone: [phone] Fax: [phone]  [place], [place] [address] Phone # [phone] Fax [phone]

## 2022-02-24 ENCOUNTER — Inpatient Hospital Stay: Payer: Medicare Other

## 2022-02-24 DIAGNOSIS — C50411 Malignant neoplasm of upper-outer quadrant of right female breast: Secondary | ICD-10-CM | POA: Diagnosis not present

## 2022-02-24 DIAGNOSIS — Z5189 Encounter for other specified aftercare: Secondary | ICD-10-CM | POA: Diagnosis not present

## 2022-02-24 DIAGNOSIS — Z0000 Encounter for general adult medical examination without abnormal findings: Secondary | ICD-10-CM | POA: Diagnosis not present

## 2022-02-24 DIAGNOSIS — Z170 Estrogen receptor positive status [ER+]: Secondary | ICD-10-CM

## 2022-02-24 DIAGNOSIS — E7800 Pure hypercholesterolemia, unspecified: Secondary | ICD-10-CM | POA: Diagnosis not present

## 2022-02-24 DIAGNOSIS — I10 Essential (primary) hypertension: Secondary | ICD-10-CM | POA: Diagnosis not present

## 2022-02-24 DIAGNOSIS — C50911 Malignant neoplasm of unspecified site of right female breast: Secondary | ICD-10-CM | POA: Diagnosis not present

## 2022-02-24 DIAGNOSIS — K580 Irritable bowel syndrome with diarrhea: Secondary | ICD-10-CM | POA: Diagnosis not present

## 2022-02-24 DIAGNOSIS — H9190 Unspecified hearing loss, unspecified ear: Secondary | ICD-10-CM | POA: Diagnosis not present

## 2022-02-24 DIAGNOSIS — M810 Age-related osteoporosis without current pathological fracture: Secondary | ICD-10-CM | POA: Diagnosis not present

## 2022-02-24 DIAGNOSIS — E559 Vitamin D deficiency, unspecified: Secondary | ICD-10-CM | POA: Diagnosis not present

## 2022-02-24 DIAGNOSIS — Z5112 Encounter for antineoplastic immunotherapy: Secondary | ICD-10-CM | POA: Diagnosis not present

## 2022-02-24 DIAGNOSIS — K5901 Slow transit constipation: Secondary | ICD-10-CM | POA: Diagnosis not present

## 2022-02-24 DIAGNOSIS — K219 Gastro-esophageal reflux disease without esophagitis: Secondary | ICD-10-CM | POA: Diagnosis not present

## 2022-02-24 DIAGNOSIS — Z5111 Encounter for antineoplastic chemotherapy: Secondary | ICD-10-CM | POA: Diagnosis not present

## 2022-02-24 DIAGNOSIS — G629 Polyneuropathy, unspecified: Secondary | ICD-10-CM | POA: Diagnosis not present

## 2022-02-24 MED ORDER — PEGFILGRASTIM-CBQV 6 MG/0.6ML ~~LOC~~ SOSY
6.0000 mg | PREFILLED_SYRINGE | Freq: Once | SUBCUTANEOUS | Status: AC
  Administered 2022-02-24: 6 mg via SUBCUTANEOUS
  Filled 2022-02-24: qty 0.6

## 2022-02-25 ENCOUNTER — Ambulatory Visit: Payer: Medicare Other

## 2022-02-25 DIAGNOSIS — Z483 Aftercare following surgery for neoplasm: Secondary | ICD-10-CM | POA: Diagnosis not present

## 2022-02-25 DIAGNOSIS — R252 Cramp and spasm: Secondary | ICD-10-CM | POA: Diagnosis not present

## 2022-02-25 DIAGNOSIS — R293 Abnormal posture: Secondary | ICD-10-CM | POA: Diagnosis not present

## 2022-02-25 DIAGNOSIS — Z9181 History of falling: Secondary | ICD-10-CM | POA: Diagnosis not present

## 2022-02-25 DIAGNOSIS — R262 Difficulty in walking, not elsewhere classified: Secondary | ICD-10-CM

## 2022-02-25 DIAGNOSIS — M6281 Muscle weakness (generalized): Secondary | ICD-10-CM | POA: Diagnosis not present

## 2022-02-25 NOTE — Therapy (Signed)
OUTPATIENT PHYSICAL THERAPY PROGRESS NOTE   Patient Name: Rhonda Richardson MRN: [id number] DOB:Jun 13, 1940, 81 y.o., female Today's Date: 02/25/2022  PT End of Session - 02/25/22 1111     Visit Number 15    Date for PT Re-Evaluation 03/10/22    Authorization Type Medicare    Progress Note Due on Visit 20    PT Start Time 1100    PT Stop Time 1145    PT Time Calculation (min) 45 min    Activity Tolerance Patient tolerated treatment well    Behavior During Therapy WFL for tasks assessed/performed                  Past Medical History:  Diagnosis Date   Breast cancer ([employer])    Hypertension    Past Surgical History:  Procedure Laterality Date   BREAST LUMPECTOMY WITH RADIOACTIVE SEED AND SENTINEL LYMPH NODE BIOPSY Right 09/15/2021   Procedure: RIGHT BREAST SEED BRACKETED LUMPECTOMY AND SENTINEL NODE BIOPSY;  Surgeon: [name], MD;  Location: [facility];  Service: General;  Laterality: Right;   cataract surgery Bilateral    DILATION AND CURETTAGE OF UTERUS  1970   EXCISION OF BREAST LESION Right 10/15/2021   Procedure: ASPIRATION RIGHT AXILLARY SEROMA;  Surgeon: [name], MD;  Location: [facility];  Service: General;  Laterality: Right;   eyelid tendon repair Bilateral    PORTACATH PLACEMENT N/A 09/15/2021   Procedure: PORT PLACEMENT;  Surgeon: [name], MD;  Location: [facility];  Service: General;  Laterality: N/A;   RE-EXCISION OF BREAST LUMPECTOMY Right 10/15/2021   Procedure: RE-EXCISION LUMPECTOMY RIGHT BREAST;  Surgeon: [name], MD;  Location: [facility];  Service: General;  Laterality: Right;   Patient Active Problem List   Diagnosis Date Noted   Port-A-Cath in place 11/18/2021   Genetic testing 09/28/2021   Malignant neoplasm of upper-outer quadrant of right breast in female, estrogen receptor positive ([place]) 09/08/2021    PCP: Kelton Pillar, MD  REFERRING PROVIDER: Benay Pike, MD  REFERRING DIAG: M62.89 (ICD-10-CM) -  Pelvic floor dysfunction  THERAPY DIAG:  Muscle weakness (generalized)  History of falling  Difficulty in walking, not elsewhere classified  Cramp and spasm  Rationale for Evaluation and Treatment Rehabilitation  ONSET DATE: 09/15/2021  SUBJECTIVE:                                                                                                                                                                                           SUBJECTIVE STATEMENT: Patient states she is feeling a little less energetic today.    PAIN:  Are you having pain?  0/10  touching lower leg feels different   PRECAUTIONS: Other: Breast cancer  WEIGHT BEARING RESTRICTIONS No  FALLS:  Has patient fallen in last 6 months? Yes on 11/13/2021 she fell after the chemotherapy treatment due to sciatica.  LIVING ENVIRONMENT: Lives with: lives with their spouse  OCCUPATION: part time  PLOF: Independent  PATIENT GOALS pelvic floor education  PERTINENT HISTORY:  Patient was diagnosed on 08/12/2021 with right grade 2 invasive ductal carcinoma breast cancer. She had a right lumpectomy and sentinel node biopsy (0/4 nodes positive) on 09/15/2021 followed by a re-excision on 10/15/2021. It is triple positive with a Ki67 of 30%. Presently having chemotherapy.   BOWEL MOVEMENT Pain with bowel movement: No  URINATION Pain with urination: No Fully empty bladder: No  INTERCOURSE Pain with intercourse:  none and uses lubricant  PREGNANCY Vaginal deliveries 3 Tearing No C-section deliveries 0 Currently pregnant No  OBJECTIVE:   DIAGNOSTIC FINDINGS:  none   COGNITION:  Overall cognitive status: Within functional limits for tasks assessed     SENSATION:  Light touch: Appears intact on left, decreased on lateral and medial right lower leg  Proprioception: Appears intact                POSTURE: No Significant postural limitations   Sit to stand 5 times 15 seconds.  TUG 11 sec Stand on right leg 3  sec Stand on left leg 15 sec with no issues Tandem stance with left leg in front 30 sec Tandem stance with right leg in front  30 sec with increased body sway   Recheck 10/26:  5x sit to stand:  12 sec                             TUG:  7 sec    PELVIC ALIGNMENT:  LUMBARAROM/PROM  A/PROM A/PROM  eval 9/21  Extension Decreased by 25% WFLs    LOWER EXTREMITY ZOX:[id number] hip ROM is full  LOWER EXTREMITY MMT:  MMT Right eval Left eval 9/21 10/31  Hip flexion 4/5 5/5    Hip extension 4/5 4/5 4 right/left  4+/5 left/right  Hip abduction 3+/5 3+/5 4- right/left  4+/5 left/right  Hip external rotation 4/5 4/5    Knee flexion 5/5 5/5    Knee extension 4/5 5/5    Ankle dorsiflexion 4/5 5/5 4+ right  4+/5 right  Ankle plantarflexion 2/5 3/5    Ankle inversion 5/5 5/5    Ankle eversion 5/5 5/5      PALPATION:   General  no tenderness                      TODAY'S TREATMENT  11/16: Recumbent bike x 5 min   Sit to stand from mat table 1 x 10 Squat to table 1 x 10 Lateral band walks with blue loop 5 laps along barre Foot on balance pad cross chop with red physio ball to opposite knee x 10 each side Step up on balance pad with red physio ball overhead to knee flexion x 10 each side Step ups 8" 2 x 10 each LE March on balance pad x 20 Squats on balance pad 2 x 10 Cone touches standing on floor (cone on 8" step for increased difficulty) 3 x 10 each LE Deeper squat to 2 balance pads on 8" step UE use to push up but also attempted without UE use.   Trunk rotation at  Matrix with 5 lb  2 x 10 each side  11/14: Discussion about not getting her immune booster. She was upset that her doctor was not aware that they did not give this to her after last chemo.   Sit to stand from mat table 1 x 10 Squat to table 1 x 10 Step ups 8" 2 x 10 each LE Lateral band walks with blue loop 5 laps along barre Deeper squat to balance pad on footstool UE use at barre. Step up and hold on balance  pad x 10 each LE fwd then lateral March on balance pad x 20 Squats on balance pad 2 x 10 Cone touches standing on floor (cone on mat table) 3 x 10 each LE Trunk rotation at Matrix with 5 lb  2 x 10 each side  11/07: Nu-step L4 5 min Sit to stand from mat table 2 x 10 Step up and hold on balance pad x 10 each LE fwd then lateral March on balance pad x 20 Squats on balance pad 2 x 10 Step up and over balance pad x 20 (added 2nd balance pad today) Cone touches standing on floor (cone on mat table) 3 x 10 each LE Standing on floor, alternating tap ups on mat table (added balance) x 20 Lunge fwd and lateral x 10 each LE on BOSU Squat to pick up 10 lb kb from footstool (modified from last visit)  Trunk rotation at Matrix with 5 lb  2 x 10 each side    PATIENT EDUCATION:  Education details: education on vaginal moisturizers and lubricants. Handed patient samples of each.  Person educated: Patient Education method: Theatre stage manager Education comprehension: verbalized understanding   HOME EXERCISE PROGRAM: Access Code: [email or web address] URL: [email or web address] Date: 02/04/2022 Prepared by: Ruben Im  Exercises - Seated Hip External Rotation Stretch  - 1 x daily - 7 x weekly - 1 sets - 3 reps - 30 hold - Seated Slump Nerve Glide  - 1 x daily - 7 x weekly - 1 sets - 5 reps - Seated March  - 1 x daily - 7 x weekly - 1 sets - 10 reps - Sit to Stand  - 1 x daily - 7 x weekly - 1 sets - 10 reps - Seated Hip Abduction with Resistance  - 1 x daily - 7 x weekly - 1 sets - 10 reps - Forward Step Up with Counter Support  - 1 x daily - 7 x weekly - 1 sets - 10 reps - Seated Hip Flexion March with Ankle Weights  - 1 x daily - 7 x weekly - 10 reps - Seated Knee Extension with Resistance  - 1 x daily - 7 x weekly - 1 sets - 10 reps Given red band for home  ASSESSMENT:  CLINICAL IMPRESSION: Lurlean continues to show good progress and is doing her HEP intermittently depending  on the effects of her chemo.  She was unable to rise from low position without UE assist but has no knee pain with this activity.  She would benefit from continuing skilled PT for LE strength and balance training.     OBJECTIVE IMPAIRMENTS Abnormal gait, decreased activity tolerance, decreased balance, decreased coordination, decreased endurance, difficulty walking, and decreased strength.   ACTIVITY LIMITATIONS lifting, bending, squatting, stairs, transfers, and locomotion level  PARTICIPATION LIMITATIONS: cleaning, laundry, shopping, community activity, and occupation  PERSONAL FACTORS Age, Fitness, Past/current experiences, and 1-2 comorbidities: breast cancer and having chemotherapy  are also  affecting patient's functional outcome.   REHAB POTENTIAL: Excellent  CLINICAL DECISION MAKING: Evolving/moderate complexity  EVALUATION COMPLEXITY: Moderate   GOALS: Goals reviewed with patient? Yes  SHORT TERM GOALS: Target date: 01/13/2022  Patient is independent with initial HEP for balance and strength.  Baseline: Goal status: goal met 10/26  2.  Patient reports her endurance and steadiness on her right leg has improved >/= 25% the 2 weeks prior to her chemotherapy treatments.  Baseline:  Goal status: met 10/26  3.  Patient educated on vaginal moisturizers and lubricants to promote good vaginal health.  Baseline:  Goal status: MET 12/16/2021  LONG TERM GOALS: Target date: 03/10/2022   Patient independent with advanced HEP for balance and strength to reduce the chance of falls.  Baseline:  Goal status: INITIAL  2.  Sit to stand 5x is </= 12.6 seconds to reduce the chance of her falling.  Baseline:  Goal status: goal met 10/26  3.  Patient able to stand on right leg for 15 seconds so she is able to go up and down steps with step over step pattern.  Baseline:  Goal status: INITIAL  4.  Tandem stance with right leg for 30 seconds with </= 50% less body swaying due to improved  strength and balance.  Baseline:  Goal status: INITIAL  5.  Patient is able to squat and stand up with >/= 50% greater ease due to increased in right lower extremity strength.  Baseline:  Goal status: INITIAL   PLAN: PT FREQUENCY: 2x/week  PT DURATION: 12 weeks  PLANNED INTERVENTIONS: Therapeutic exercises, Therapeutic activity, Neuromuscular re-education, Balance training, Gait training, Patient/Family education, Self Care, and Stair training  PLAN FOR NEXT SESSION:   continue leg strength, functional training and balance training;    Anderson Malta B. Layth Cerezo, PT 02/25/22 12:09 PM   Phone: [phone] Fax: [phone]  [place], [place] [address] Phone # [phone] Fax [phone]

## 2022-03-05 ENCOUNTER — Other Ambulatory Visit: Payer: Self-pay

## 2022-03-11 ENCOUNTER — Other Ambulatory Visit: Payer: Self-pay

## 2022-03-12 ENCOUNTER — Other Ambulatory Visit: Payer: Self-pay | Admitting: *Deleted

## 2022-03-12 DIAGNOSIS — Z170 Estrogen receptor positive status [ER+]: Secondary | ICD-10-CM

## 2022-03-15 ENCOUNTER — Encounter: Payer: Self-pay | Admitting: Hematology and Oncology

## 2022-03-15 ENCOUNTER — Inpatient Hospital Stay: Payer: Medicare Other | Attending: Hematology and Oncology | Admitting: Hematology and Oncology

## 2022-03-15 ENCOUNTER — Inpatient Hospital Stay: Payer: Medicare Other

## 2022-03-15 VITALS — BP 157/89 | HR 96 | Temp 97.5°F | Resp 16 | Ht 62.0 in | Wt 136.7 lb

## 2022-03-15 DIAGNOSIS — Z808 Family history of malignant neoplasm of other organs or systems: Secondary | ICD-10-CM | POA: Insufficient documentation

## 2022-03-15 DIAGNOSIS — C50411 Malignant neoplasm of upper-outer quadrant of right female breast: Secondary | ICD-10-CM | POA: Diagnosis not present

## 2022-03-15 DIAGNOSIS — Z8249 Family history of ischemic heart disease and other diseases of the circulatory system: Secondary | ICD-10-CM | POA: Insufficient documentation

## 2022-03-15 DIAGNOSIS — Z881 Allergy status to other antibiotic agents status: Secondary | ICD-10-CM | POA: Diagnosis not present

## 2022-03-15 DIAGNOSIS — Z5112 Encounter for antineoplastic immunotherapy: Secondary | ICD-10-CM | POA: Diagnosis not present

## 2022-03-15 DIAGNOSIS — Z79899 Other long term (current) drug therapy: Secondary | ICD-10-CM | POA: Diagnosis not present

## 2022-03-15 DIAGNOSIS — Z95828 Presence of other vascular implants and grafts: Secondary | ICD-10-CM

## 2022-03-15 DIAGNOSIS — Z170 Estrogen receptor positive status [ER+]: Secondary | ICD-10-CM

## 2022-03-15 DIAGNOSIS — G629 Polyneuropathy, unspecified: Secondary | ICD-10-CM | POA: Insufficient documentation

## 2022-03-15 DIAGNOSIS — Z5111 Encounter for antineoplastic chemotherapy: Secondary | ICD-10-CM | POA: Diagnosis present

## 2022-03-15 LAB — CMP (CANCER CENTER ONLY)
ALT: 17 U/L (ref 0–44)
AST: 20 U/L (ref 15–41)
Albumin: 4.1 g/dL (ref 3.5–5.0)
Alkaline Phosphatase: 96 U/L (ref 38–126)
Anion gap: 5 (ref 5–15)
BUN: 22 mg/dL (ref 8–23)
CO2: 27 mmol/L (ref 22–32)
Calcium: 9.2 mg/dL (ref 8.9–10.3)
Chloride: 106 mmol/L (ref 98–111)
Creatinine: 0.8 mg/dL (ref 0.44–1.00)
GFR, Estimated: >60 mL/min (ref >60)
Glucose, Bld: 91 mg/dL (ref 70–99)
Potassium: 3.9 mmol/L (ref 3.5–5.1)
Sodium: 138 mmol/L (ref 135–145)
Total Bilirubin: 0.4 mg/dL (ref 0.3–1.2)
Total Protein: 6.6 g/dL (ref 6.5–8.1)

## 2022-03-15 LAB — CBC WITH DIFFERENTIAL (CANCER CENTER ONLY)
Abs Immature Granulocytes: 0.02 10*3/uL (ref 0.00–0.07)
Basophils Absolute: 0.1 10*3/uL (ref 0.0–0.1)
Basophils Relative: 1 %
Eosinophils Absolute: 0.1 10*3/uL (ref 0.0–0.5)
Eosinophils Relative: 1 %
HCT: 28 % — ABNORMAL LOW (ref 36.0–46.0)
Hemoglobin: 9.2 g/dL — ABNORMAL LOW (ref 12.0–15.0)
Immature Granulocytes: 0 %
Lymphocytes Relative: 25 %
Lymphs Abs: 1.6 10*3/uL (ref 0.7–4.0)
MCH: 29.3 pg (ref 26.0–34.0)
MCHC: 32.9 g/dL (ref 30.0–36.0)
MCV: 89.2 fL (ref 80.0–100.0)
Monocytes Absolute: 0.6 10*3/uL (ref 0.1–1.0)
Monocytes Relative: 9 %
Neutro Abs: 4.1 10*3/uL (ref 1.7–7.7)
Neutrophils Relative %: 64 %
Platelet Count: 254 10*3/uL (ref 150–400)
RBC: 3.14 MIL/uL — ABNORMAL LOW (ref 3.87–5.11)
RDW: 15 % (ref 11.5–15.5)
WBC Count: 6.5 10*3/uL (ref 4.0–10.5)
nRBC: 0 % (ref 0.0–0.2)

## 2022-03-15 MED ORDER — ACETAMINOPHEN 325 MG PO TABS
650.0000 mg | ORAL_TABLET | Freq: Once | ORAL | Status: AC
  Administered 2022-03-15: 650 mg via ORAL
  Filled 2022-03-15: qty 2

## 2022-03-15 MED ORDER — SODIUM CHLORIDE 0.9 % IV SOLN: Freq: Once | INTRAVENOUS | Status: DC

## 2022-03-15 MED ORDER — TRASTUZUMAB-HYALURONIDASE-OYSK 600-10000 MG-UNT/5ML ~~LOC~~ SOLN
600.0000 mg | Freq: Once | SUBCUTANEOUS | Status: AC
  Administered 2022-03-15: 600 mg via SUBCUTANEOUS
  Filled 2022-03-15: qty 5

## 2022-03-15 MED ORDER — SODIUM CHLORIDE 0.9% FLUSH
10.0000 mL | Freq: Once | INTRAVENOUS | Status: AC
  Administered 2022-03-15: 10 mL

## 2022-03-15 NOTE — Progress Notes (Signed)
[facility] PROGRESS NOTE  Patient Care Team: Collene Leyden, MD as PCP - General (Family Medicine) [name], MD as Consulting Physician (General Surgery) Benay Pike, MD as Consulting Physician (Hematology and Oncology) Kyung Rudd, MD as Consulting Physician (Radiation Oncology) Mauro Kaufmann, RN as Oncology Nurse Navigator Rockwell Germany, RN as Oncology Nurse Navigator  CHIEF COMPLAINTS/PURPOSE OF CONSULTATION:  Breast cancer  SUMMARY OF ONCOLOGIC HISTORY: Oncology History  Malignant neoplasm of upper-outer quadrant of right breast in female, estrogen receptor positive ([place])  08/12/2021 Mammogram   Mammogram showed possible mass in the right breast.  Diagnostic mammogram showed 2 highly suspicious upper outer right breast masses measuring 1.5 cm in the 9:30 position and 1.4 cm at the 10 o'clock position.  These masses or distortions mammographically spanning a distance of up to 3.5 cm.  No abnormal appearing right axillary lymph nodes.   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  Prognostic showed ER 99% positive strong staining PR 10% positive moderate staining, HER2 positive 3+ and Ki-67 of 30%.   09/08/2021 Initial Diagnosis   Malignant neoplasm of upper-outer quadrant of right breast in female, estrogen receptor positive ([place])   09/15/2021 Pathology Results   She had right breast lumpectomy which showed invasive ductal carcinoma, 3.6 cm in maximal extent involving inferior and medial margins, DCIS approaching to less than 0.1 cm of closest margin no metastatic carcinoma identified in 4 out of 4 lymph nodes.  She is scheduled for repeat surgery given positive margins.  Prior prognostic showed ER 99% positive strong staining intensity PR 10% positive moderate staining intensity, HER2 positive.   09/23/2021 Genetic Testing   Negative hereditary cancer genetic testing: no pathogenic variants  detected in [facility] CustomNext-cancer +RNAinsight Panel.  Report date is September 23, 2021.   The CustomNext-Cancer+RNAinsight panel offered by [facility] includes sequencing and rearrangement analysis for the following 47 genes:  APC, ATM, AXIN2, BARD1, BMPR1A, BRCA1, BRCA2, BRIP1, CDH1, CDK4, CDKN2A, CHEK2, DICER1, EPCAM, GREM1, HOXB13, MEN1, MLH1, MSH2, MSH3, MSH6, MUTYH, NBN, NF1, NF2, NTHL1, PALB2, PMS2, POLD1, POLE, PTEN, RAD51C, RAD51D, RECQL, RET, SDHA, SDHAF2, SDHB, SDHC, SDHD, SMAD4, SMARCA4, STK11, TP53, TSC1, TSC2, and VHL.  RNA data is routinely analyzed for use in variant interpretation for all genes.   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 DCIS 0.25 mg from true/new margin.   11/10/2021 - 12/02/2021 Chemotherapy   Patient is on Treatment Plan : BREAST Docetaxel + Carboplatin + Trastuzumab ([place]) q21d / Trastuzumab q21d     11/10/2021 - 12/03/2021 Chemotherapy   Patient is on Treatment Plan : BREAST  Docetaxel + Carboplatin + Trastuzumab + Pertuzumab  (TCHP) q21d      11/10/2021 -  Chemotherapy   Patient is on Treatment Plan : BREAST Docetaxel + Carboplatin + Trastuzumab (TCH) q21d / Trastuzumab q21d      INTERVAL HISTORY:  Rhonda Richardson returns for a follow up visit after receiving Cycle 6 of carboplatin/docetaxel and trastuzumab She has some tingling and numbness in the bottom of the feet, it never goes completely numb, feels abnormal, more on the left. It is not bothersome at all.  She otherwise tolerated chemotherapy really well.  She is now on adjuvant Herceptin and she will also continue follow-up with Dr. Genia Harold from radiation oncology for consideration of  adjuvant radiation.  She was hoping to try the subcutaneous injections to save time.  Otherwise she denies any complaints today for me.  Rest of the pertinent 10 point ROS reviewed and  negative  MEDICAL HISTORY:  Past Medical History:  Diagnosis Date   Breast cancer ([place])    Hypertension     SURGICAL HISTORY: Past Surgical History:  Procedure Laterality Date   BREAST LUMPECTOMY WITH RADIOACTIVE SEED AND SENTINEL LYMPH NODE BIOPSY Right 09/15/2021   Procedure: RIGHT BREAST SEED BRACKETED LUMPECTOMY AND SENTINEL NODE BIOPSY;  Surgeon: [name], MD;  Location: [facility];  Service: General;  Laterality: Right;   cataract surgery Bilateral    DILATION AND CURETTAGE OF UTERUS  1970   EXCISION OF BREAST LESION Right 10/15/2021   Procedure: ASPIRATION RIGHT AXILLARY SEROMA;  Surgeon: [name], MD;  Location: [facility];  Service: General;  Laterality: Right;   eyelid tendon repair Bilateral    PORTACATH PLACEMENT N/A 09/15/2021   Procedure: PORT PLACEMENT;  Surgeon: [name], MD;  Location: [facility];  Service: General;  Laterality: N/A;   RE-EXCISION OF BREAST LUMPECTOMY Right 10/15/2021   Procedure: RE-EXCISION LUMPECTOMY RIGHT BREAST;  Surgeon: [name], MD;  Location: [facility];  Service: General;  Laterality: Right;    SOCIAL HISTORY: Social History   Socioeconomic History   Marital status: Married    Spouse name: Not on file   Number of children: Not on file   Years of education: Not on file   Highest education level: Not on file  Occupational History   Not on file  Tobacco Use   Smoking status: Never   Smokeless tobacco: Not on file  Vaping Use   Vaping Use: Never used  Substance and Sexual Activity   Alcohol use: Yes    Comment: 3- 4 drinks   Drug use: Never   Sexual activity: Not Currently  Other Topics Concern   Not on file  Social History Narrative   Not on file   Social Determinants of Health   Financial Resource Strain: Low Risk  (09/09/2021)   Overall Financial Resource Strain (CARDIA)    Difficulty of Paying Living Expenses: Not hard at all  Food Insecurity: No Food Insecurity (09/09/2021)   Hunger  Vital Sign    Worried About Running Out of Food in the Last Year: Never true    Ran Out of Food in the Last Year: Never true  Transportation Needs: No Transportation Needs (09/09/2021)   PRAPARE - Hydrologist (Medical): No    Lack of Transportation (Non-Medical): No  Physical Activity: Not on file  Stress: Not on file  Social Connections: Not on file  Intimate Partner Violence: Not on file    FAMILY HISTORY: Family History  Problem Relation Age of Onset   Heart disease Father 93   Thyroid cancer Other 81       niece's daughter    ALLERGIES:  is allergic to macrodantin [nitrofurantoin].  MEDICATIONS:  Current Outpatient Medications  Medication Sig Dispense Refill   acetaminophen (TYLENOL) 325 MG tablet Take 325 mg by mouth every 6 (six) hours as needed for moderate pain.     benazepril (LOTENSIN) 20 MG tablet Take 20 mg by mouth daily.     cholecalciferol (VITAMIN D3) 25 MCG (1000 UNIT) tablet Take 1,000 Units by mouth daily.     famotidine (PEPCID) 20 MG tablet Take 20 mg by mouth daily as needed for heartburn  or indigestion.     hydrochlorothiazide (MICROZIDE) 12.5 MG capsule Take 12.5 mg by mouth daily.     ibuprofen (ADVIL) 200 MG tablet Take 200 mg by mouth every 6 (six) hours as needed for moderate pain.     Magnesium 250 MG TABS Take 250 mg by mouth daily.     Red Yeast Rice 600 MG CAPS Take 600 mg by mouth daily.     traMADol (ULTRAM) 50 MG tablet Take by mouth every 6 (six) hours as needed.     Vitamins-Lipotropics (MULTI-VITAMIN HP/MINERALS PO) Take 1 tablet by mouth daily.     No current facility-administered medications for this visit.   Facility-Administered Medications Ordered in Other Visits  Medication Dose Route Frequency Provider Last Rate Last Admin   0.9 %  sodium chloride infusion   Intravenous Once Deijah Spikes, MD       trastuzumab-hyaluronidase-oysk (HERCEPTIN HYLECTA) 600-10000 MG-UNT/5ML chemo SQ injection 600 mg  600  mg Subcutaneous Once Westin Knotts, Arletha Pili, MD        PHYSICAL EXAMINATION: ECOG PERFORMANCE STATUS: 0 - Asymptomatic  Vitals:   03/15/22 0939  BP: (!) 157/89  Pulse: 96  Resp: 16  Temp: (!) 97.5 F (36.4 C)  SpO2: 100%    Filed Weights   03/15/22 0939  Weight: 136 lb 11.2 oz (62 kg)   Physical Exam Constitutional:      Appearance: Normal appearance.  Cardiovascular:     Rate and Rhythm: Normal rate and regular rhythm.  Pulmonary:     Effort: Pulmonary effort is normal.     Breath sounds: Normal breath sounds.  Chest:     Comments: Bilateral breasts inspected. No palpable masses or regional adenopathy. Musculoskeletal:     Cervical back: Normal range of motion and neck supple. No rigidity.  Lymphadenopathy:     Cervical: No cervical adenopathy.  Skin:    General: Skin is warm and dry.  Neurological:     General: No focal deficit present.     Mental Status: She is alert.  Psychiatric:        Mood and Affect: Mood normal.     LABORATORY DATA:  I have reviewed the data as listed Lab Results  Component Value Date   WBC 6.5 03/15/2022   HGB 9.2 (L) 03/15/2022   HCT 28.0 (L) 03/15/2022   MCV 89.2 03/15/2022   PLT 254 03/15/2022   Lab Results  Component Value Date   NA 138 02/22/2022   K 4.1 02/22/2022   CL 105 02/22/2022   CO2 29 02/22/2022    RADIOGRAPHIC STUDIES: I have personally reviewed the radiological reports and agreed with the findings in the report.  ASSESSMENT AND PLAN:  SHUNDA RABADI is a 81 y.o. female who presents for a follow up for right breast cancer.   #Malignant neoplasm of upper-outer quadrant of right breast in female, estrogen receptor positive  --ER/PR positive, HER2 amplified.  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 Media margin showed focal high-grade ductal carcinoma  in situ, solid type with necrosis, negative for invasive carcinoma. Inferior and posterior margin negative for carcinoma.  --Given large tumor, triple positive, recommend dose modified TCH for 6 cycles followed by adjuvant herceptin vs taxol with herceptin. -- Last echocardiogram September 28 showed ejection fraction of 60 to 65%.  Next echo scheduled for December 18. -- No concerns on review of systems  or physical examination today.  Okay to change to subcutaneous Herceptin.  She will continue Herceptin every 3 weeks with labs and follow-up with [place] every 6 weeks.  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  Since last visit, she has been dealing with very mild neuropathy.  This is not bothersome.  # BLE edema,  resolved.  RTC in 6 weeks.  Proceed with radiation oncology appointment as scheduled.  After completing adjuvant radiation we will start her on antiestrogen therapy. All questions were answered. The patient knows to call the clinic with any problems, questions or concerns.   I have spent a total of 30 minutes minutes of face-to-face and non-face-to-face time, preparing to see the patient, performing a medically appropriate examination, counseling and educating the patient, documenting clinical information in the electronic health record, and care coordination.

## 2022-03-15 NOTE — Progress Notes (Signed)
Patient waited 30 minutes post observation for her herceptin hylecta with no issues

## 2022-03-17 ENCOUNTER — Other Ambulatory Visit: Payer: Self-pay

## 2022-03-19 ENCOUNTER — Other Ambulatory Visit: Payer: Self-pay

## 2022-03-22 ENCOUNTER — Telehealth: Payer: Self-pay

## 2022-03-22 NOTE — Telephone Encounter (Signed)
Patient called to ask Dr. Chryl Heck if she can continue with the Herceptin Hylecta.  She states that she tolerated the treatment well.   Patient heard that if she switches to hylecta that she will only have to have labs every six weeks.   Routed to provider.

## 2022-03-22 NOTE — Progress Notes (Signed)
Radiation Oncology         [phone] ________________________________  Name: Rhonda Richardson        MRN: [id number]  Date of Service: 03/23/2022 DOB: [birth year]  QQ:[id number], Geryl Rankins, MD  Benay Pike, MD     REFERRING PHYSICIAN: Benay Pike, MD   DIAGNOSIS: The encounter diagnosis was Malignant neoplasm of upper-outer quadrant of right breast in female, estrogen receptor positive ([place]).   HISTORY OF PRESENT ILLNESS: Rhonda Richardson is a 81 y.o. female originally seen in the multidisciplinary breast clinic for a new diagnosis of right breast cancer. The patient was noted to have a screening detected mass in the right breast. Further diagnostic imaging showed a 1.5 cm mass in the 9:30 position and also a 1.4 cm mass in the 10:00 position, her right axilla was negative for adenopathy. 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 Her cancer was triple positive with a Ki 67 of 30%.   Since her last visit, she  underwent a right lumpectomy on 09/15/21  that showed a grade 1 invasive ductal carcinoma measuring 3.6 cm,  involving the inferior and medial margins. There was also associated DCIS that was less than 1 mm to the posterior margin. Four sentinel nodes were all negative for malignancy. 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 The inferior and posterior margins were benign.  She began systemic chemotherapy on 11/10/21 and completed this on 02/22/22. She continues with antiHER2 therapy and is seen today to discuss adjuvant radiotherapy to the right breast.    PREVIOUS RADIATION THERAPY: No   PAST MEDICAL HISTORY:  Past Medical History:  Diagnosis Date   Breast cancer ([place])    Hypertension        PAST SURGICAL HISTORY: Past Surgical History:  Procedure Laterality Date   BREAST LUMPECTOMY  WITH RADIOACTIVE SEED AND SENTINEL LYMPH NODE BIOPSY Right 09/15/2021   Procedure: RIGHT BREAST SEED BRACKETED LUMPECTOMY AND SENTINEL NODE BIOPSY;  Surgeon: [name], MD;  Location: [facility];  Service: General;  Laterality: Right;   cataract surgery Bilateral    DILATION AND CURETTAGE OF UTERUS  1970   EXCISION OF BREAST LESION Right 10/15/2021   Procedure: ASPIRATION RIGHT AXILLARY SEROMA;  Surgeon: [name], MD;  Location: [facility];  Service: General;  Laterality: Right;   eyelid tendon repair Bilateral    PORTACATH PLACEMENT N/A 09/15/2021   Procedure: PORT PLACEMENT;  Surgeon: [name], MD;  Location: [facility];  Service: General;  Laterality: N/A;   RE-EXCISION OF BREAST LUMPECTOMY Right 10/15/2021   Procedure: RE-EXCISION LUMPECTOMY RIGHT BREAST;  Surgeon: [name], MD;  [facility];  Service: General;  Laterality: Right;     FAMILY HISTORY:  Family History  Problem Relation Age of Onset   Heart disease Father 25   Thyroid cancer Other 37       niece's daughter     SOCIAL HISTORY:  reports that she has never smoked. She does not have any smokeless tobacco history on file. She reports current alcohol use. She reports that she does not use drugs. The patient is married and lives in [place]. She works part time at [employer].     ALLERGIES: Macrodantin [nitrofurantoin]   MEDICATIONS:  Current Outpatient Medications  Medication Sig Dispense Refill   acetaminophen (  TYLENOL) 325 MG tablet Take 325 mg by mouth every 6 (six) hours as needed for moderate pain.     benazepril (LOTENSIN) 20 MG tablet Take 20 mg by mouth daily.     cholecalciferol (VITAMIN D3) 25 MCG (1000 UNIT) tablet Take 1,000 Units by mouth daily.     famotidine (PEPCID) 20 MG tablet Take 20 mg by mouth daily as needed for heartburn or indigestion.     hydrochlorothiazide (MICROZIDE) 12.5 MG capsule Take 12.5 mg by mouth daily.     ibuprofen (ADVIL) 200 MG  tablet Take 200 mg by mouth every 6 (six) hours as needed for moderate pain.     Magnesium 250 MG TABS Take 250 mg by mouth daily.     Red Yeast Rice 600 MG CAPS Take 600 mg by mouth daily.     traMADol (ULTRAM) 50 MG tablet Take by mouth every 6 (six) hours as needed.     Vitamins-Lipotropics (MULTI-VITAMIN HP/MINERALS PO) Take 1 tablet by mouth daily.     No current facility-administered medications for this visit.     REVIEW OF SYSTEMS: On review of systems, the patient reports that she is doing well overall. She reports her taste was changed significantly during chemotherapy but is slowly starting to improve. She reports as well that she still has darkening along her nipple area of her breast, and was curious how long bruising from surgery in June should last. No other complaints are verbalized.      PHYSICAL EXAM:  Wt Readings from Last 3 Encounters:  03/15/22 136 lb 11.2 oz (62 kg)  02/22/22 139 lb 11.2 oz (63.4 kg)  02/02/22 135 lb (61.2 kg)   Temp Readings from Last 3 Encounters:  03/15/22 (!) 97.5 F (36.4 C) (Temporal)  02/22/22 97.9 F (36.6 C) (Temporal)  02/02/22 98.2 F (36.8 C) (Oral)   BP Readings from Last 3 Encounters:  03/15/22 (!) 157/89  02/22/22 (!) 150/79  02/02/22 131/68   Pulse Readings from Last 3 Encounters:  03/15/22 96  02/22/22 89  02/02/22 83    In general this is a well appearing caucasian female in no acute distress. She's alert and oriented x4 and appropriate throughout the examination. Cardiopulmonary assessment is negative for acute distress and she exhibits normal effort. Her right breast and axillary incision sites are both well healed without erythema, separation, or drainage. She has visible magtrace staining along her medial right breast consistent with injection for her surgery in June, confirmed by the Op Note.     ECOG = 1  0 - Asymptomatic (Fully active, able to carry on all predisease activities without restriction)  1 -  Symptomatic but completely ambulatory (Restricted in physically strenuous activity but ambulatory and able to carry out work of a light or sedentary nature. For example, light housework, office work)  2 - Symptomatic, <50% in bed during the day (Ambulatory and capable of all self care but unable to carry out any work activities. Up and about more than 50% of waking hours)  3 - Symptomatic, >50% in bed, but not bedbound (Capable of only limited self-care, confined to bed or chair 50% or more of waking hours)  4 - Bedbound (Completely disabled. Cannot carry on any self-care. Totally confined to bed or chair)  5 - Death   Eustace Pen MM, Creech RH, Tormey DC, et al. [phone]). "Toxicity and response criteria of the [facility]". [place] Oncol. 5 (6): 649-55    LABORATORY DATA:  Lab Results  Component Value Date   WBC 6.5 03/15/2022   HGB 9.2 (L) 03/15/2022   HCT 28.0 (L) 03/15/2022   MCV 89.2 03/15/2022   PLT 254 03/15/2022   Lab Results  Component Value Date   NA 138 03/15/2022   K 3.9 03/15/2022   CL 106 03/15/2022   CO2 27 03/15/2022   Lab Results  Component Value Date   ALT 17 03/15/2022   AST 20 03/15/2022   ALKPHOS 96 03/15/2022   BILITOT 0.4 03/15/2022      RADIOGRAPHY: No results found.     IMPRESSION/PLAN: 1. 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 Dr. Lisbeth Renshaw has reviewed her final pathology findings as well has her course to date. She is now finished with chemotherapy and would benefit from adjuvant external radiotherapy to the breast  to reduce risks of local recurrence. We discussed the risks, benefits, short, and long term effects of radiotherapy, as well as the curative intent, and the patient is interested in proceeding. We reviewed the delivery and logistics of radiotherapy and Dr. Lisbeth Renshaw recommends a course of 4  weeks of radiotherapy to the right breast. Written consent is obtained and placed in  the chart, a copy was provided to the patient. She will simulate today. I encouraged her to discuss her questions as well about persistent hyperpigmentation from mag trace with Dr. Barry Dienes. She's in agreement with this plan.    In a visit lasting 50 minutes, greater than 50% of the time was spent face to face reviewing her case, as well as in preparation of, discussing, and coordinating the patient's care.      Carola Rhine, [facility]    **Disclaimer: This note was dictated with voice recognition software. Similar sounding words can inadvertently be transcribed and this note may contain transcription errors which may not have been corrected upon publication of note.**

## 2022-03-23 ENCOUNTER — Ambulatory Visit
Admission: RE | Admit: 2022-03-23 | Discharge: 2022-03-23 | Disposition: A | Discharge status: Home / Self Care | Payer: Medicare Other | Source: Ambulatory Visit | Attending: Radiation Oncology | Admitting: Radiation Oncology

## 2022-03-23 ENCOUNTER — Other Ambulatory Visit: Payer: Self-pay

## 2022-03-23 ENCOUNTER — Encounter: Payer: Self-pay | Admitting: Radiation Oncology

## 2022-03-23 VITALS — BP 146/68 | HR 93 | Temp 97.4°F | Resp 20 | Ht 62.0 in | Wt 136.0 lb

## 2022-03-23 DIAGNOSIS — Z170 Estrogen receptor positive status [ER+]: Secondary | ICD-10-CM

## 2022-03-23 DIAGNOSIS — C50411 Malignant neoplasm of upper-outer quadrant of right female breast: Secondary | ICD-10-CM | POA: Insufficient documentation

## 2022-03-23 DIAGNOSIS — Z79899 Other long term (current) drug therapy: Secondary | ICD-10-CM | POA: Insufficient documentation

## 2022-03-23 DIAGNOSIS — Z79811 Long term (current) use of aromatase inhibitors: Secondary | ICD-10-CM | POA: Insufficient documentation

## 2022-03-23 DIAGNOSIS — Z510 Encounter for antineoplastic radiation therapy: Secondary | ICD-10-CM | POA: Diagnosis not present

## 2022-03-23 NOTE — Progress Notes (Signed)
Nursing consult for Malignant neoplasm of upper-outer quadrant of right breast in female, estrogen receptor positive ([place]).  I verified patient's identity and began nursing interview. Patient is doing well. No issues reported at this time.  Meaningful use complete. Postmenopausal- NO chances of pregnancy.  BP (!) 146/68 (BP Location: Left Arm, Patient Position: Sitting, Cuff Size: Normal)   Pulse 93   Temp (!) 97.4 F (36.3 C) (Temporal)   Resp 20   Ht '5\' 2"'$  (1.575 m)   Wt 136 lb (61.7 kg)   SpO2 100%   BMI 24.87 kg/m   This concludes the interview.   Leandra Kern, LPN

## 2022-03-24 DIAGNOSIS — Z23 Encounter for immunization: Secondary | ICD-10-CM | POA: Diagnosis not present

## 2022-03-29 ENCOUNTER — Ambulatory Visit: Payer: Medicare Other | Attending: General Surgery

## 2022-03-29 ENCOUNTER — Ambulatory Visit (HOSPITAL_COMMUNITY)
Admission: RE | Admit: 2022-03-29 | Discharge: 2022-03-29 | Disposition: A | Discharge status: Home / Self Care | Payer: Medicare Other | Source: Ambulatory Visit | Attending: Hematology and Oncology | Admitting: Hematology and Oncology

## 2022-03-29 ENCOUNTER — Encounter: Payer: Self-pay | Admitting: *Deleted

## 2022-03-29 VITALS — Wt 140.2 lb

## 2022-03-29 DIAGNOSIS — Z5181 Encounter for therapeutic drug level monitoring: Secondary | ICD-10-CM | POA: Insufficient documentation

## 2022-03-29 DIAGNOSIS — Z483 Aftercare following surgery for neoplasm: Secondary | ICD-10-CM | POA: Insufficient documentation

## 2022-03-29 DIAGNOSIS — Z853 Personal history of malignant neoplasm of breast: Secondary | ICD-10-CM | POA: Insufficient documentation

## 2022-03-29 DIAGNOSIS — I10 Essential (primary) hypertension: Secondary | ICD-10-CM | POA: Insufficient documentation

## 2022-03-29 DIAGNOSIS — Z510 Encounter for antineoplastic radiation therapy: Secondary | ICD-10-CM | POA: Diagnosis not present

## 2022-03-29 DIAGNOSIS — C50411 Malignant neoplasm of upper-outer quadrant of right female breast: Secondary | ICD-10-CM | POA: Diagnosis not present

## 2022-03-29 DIAGNOSIS — Z79899 Other long term (current) drug therapy: Secondary | ICD-10-CM | POA: Diagnosis not present

## 2022-03-29 DIAGNOSIS — Z170 Estrogen receptor positive status [ER+]: Secondary | ICD-10-CM | POA: Diagnosis not present

## 2022-03-29 DIAGNOSIS — Z0189 Encounter for other specified special examinations: Secondary | ICD-10-CM

## 2022-03-29 LAB — ECHOCARDIOGRAM COMPLETE
Area-P 1/2: 3.27 cm2
Calc EF: 53.6 %
S' Lateral: 2.7 cm
Single Plane A2C EF: 53.5 %
Single Plane A4C EF: 52.2 %

## 2022-03-29 NOTE — Progress Notes (Signed)
  Echocardiogram 2D Echocardiogram has been performed.  Rhonda Richardson 03/29/2022, 12:29 PM

## 2022-03-29 NOTE — Therapy (Signed)
  OUTPATIENT PHYSICAL THERAPY [facility] SCREENING NOTE   Patient Name: Rhonda Richardson MRN: [id number] DOB:1940-12-11, 81 y.o., female Today's Date: 03/29/2022  PCP: Collene Leyden, MD REFERRING PROVIDER: [name], MD   PT End of Session - 03/29/22 1551     Visit Number 15   # unchanged due to screen only   PT Start Time 1548    PT Stop Time 1552    PT Time Calculation (min) 4 min    Activity Tolerance Patient tolerated treatment well    Behavior During Therapy WFL for tasks assessed/performed             Past Medical History:  Diagnosis Date   Breast cancer ([place])    Hypertension    Past Surgical History:  Procedure Laterality Date   BREAST LUMPECTOMY WITH RADIOACTIVE SEED AND SENTINEL LYMPH NODE BIOPSY Right 09/15/2021   Procedure: RIGHT BREAST SEED BRACKETED LUMPECTOMY AND SENTINEL NODE BIOPSY;  Surgeon: [name], MD;  Location: [facility];  Service: General;  Laterality: Right;   cataract surgery Bilateral    DILATION AND CURETTAGE OF UTERUS  1970   EXCISION OF BREAST LESION Right 10/15/2021   Procedure: ASPIRATION RIGHT AXILLARY SEROMA;  Surgeon: [name], MD;  Location: [facility];  Service: General;  Laterality: Right;   eyelid tendon repair Bilateral    PORTACATH PLACEMENT N/A 09/15/2021   Procedure: PORT PLACEMENT;  Surgeon: [name], MD;  Location: [facility];  Service: General;  Laterality: N/A;   RE-EXCISION OF BREAST LUMPECTOMY Right 10/15/2021   Procedure: RE-EXCISION LUMPECTOMY RIGHT BREAST;  Surgeon: [name], MD;  Location: [facility];  Service: General;  Laterality: Right;   Patient Active Problem List   Diagnosis Date Noted   Port-A-Cath in place 11/18/2021   Genetic testing 09/28/2021   Malignant neoplasm of upper-outer quadrant of right breast in female, estrogen receptor positive ([place]) 09/08/2021    REFERRING DIAG: right breast cancer at risk for lymphedema  THERAPY DIAG:  Aftercare following surgery for  neoplasm  PERTINENT HISTORY: right UE Lymphedema risk, None   PRECAUTIONS: right UE Lymphedema risk, None  SUBJECTIVE: Pt returns for her 3 month L-dex screen.   PAIN:  Are you having pain? No  SOZO SCREENING: Patient was assessed today using the SOZO machine to determine the lymphedema index score. This was compared to her baseline score. It was determined that she is within the recommended range when compared to her baseline and no further action is needed at this time. She will continue SOZO screenings. These are done every 3 months for 2 years post operatively followed by every 6 months for 2 years, and then annually.   L-DEX FLOWSHEETS - 03/29/22 1500       L-DEX LYMPHEDEMA SCREENING   Measurement Type Unilateral    L-DEX MEASUREMENT EXTREMITY Upper Extremity    POSITION  Standing    DOMINANT SIDE Right    At Risk Side Right    BASELINE SCORE (UNILATERAL) -1.9    L-DEX SCORE (UNILATERAL) -5.7    VALUE CHANGE (UNILAT) -3.8               Otelia Limes, PTA 03/29/2022, 3:52 PM

## 2022-03-30 ENCOUNTER — Ambulatory Visit
Admission: RE | Admit: 2022-03-30 | Discharge: 2022-03-30 | Disposition: A | Discharge status: Home / Self Care | Payer: Medicare Other | Source: Ambulatory Visit | Attending: Radiation Oncology | Admitting: Radiation Oncology

## 2022-03-30 ENCOUNTER — Other Ambulatory Visit: Payer: Self-pay

## 2022-03-30 DIAGNOSIS — Z510 Encounter for antineoplastic radiation therapy: Secondary | ICD-10-CM | POA: Diagnosis not present

## 2022-03-30 DIAGNOSIS — Z170 Estrogen receptor positive status [ER+]: Secondary | ICD-10-CM | POA: Diagnosis not present

## 2022-03-30 DIAGNOSIS — C50411 Malignant neoplasm of upper-outer quadrant of right female breast: Secondary | ICD-10-CM | POA: Diagnosis not present

## 2022-03-30 LAB — RAD ONC ARIA SESSION SUMMARY
Course Elapsed Days: 0
Plan Fractions Treated to Date: 1
Plan Prescribed Dose Per Fraction: 2.66 Gy
Plan Total Fractions Prescribed: 16
Plan Total Prescribed Dose: 42.56 Gy
Reference Point Dosage Given to Date: 2.66 Gy
Reference Point Session Dosage Given: 2.66 Gy
Session Number: 1

## 2022-03-31 ENCOUNTER — Ambulatory Visit
Admission: RE | Admit: 2022-03-31 | Discharge: 2022-03-31 | Disposition: A | Discharge status: Home / Self Care | Payer: Medicare Other | Source: Ambulatory Visit | Attending: Radiation Oncology | Admitting: Radiation Oncology

## 2022-03-31 ENCOUNTER — Other Ambulatory Visit: Payer: Self-pay

## 2022-03-31 DIAGNOSIS — Z170 Estrogen receptor positive status [ER+]: Secondary | ICD-10-CM | POA: Diagnosis not present

## 2022-03-31 DIAGNOSIS — C50411 Malignant neoplasm of upper-outer quadrant of right female breast: Secondary | ICD-10-CM | POA: Diagnosis not present

## 2022-03-31 DIAGNOSIS — Z510 Encounter for antineoplastic radiation therapy: Secondary | ICD-10-CM | POA: Diagnosis not present

## 2022-03-31 LAB — RAD ONC ARIA SESSION SUMMARY
Course Elapsed Days: 1
Plan Fractions Treated to Date: 2
Plan Prescribed Dose Per Fraction: 2.66 Gy
Plan Total Fractions Prescribed: 16
Plan Total Prescribed Dose: 42.56 Gy
Reference Point Dosage Given to Date: 5.32 Gy
Reference Point Session Dosage Given: 2.66 Gy
Session Number: 2

## 2022-04-01 ENCOUNTER — Other Ambulatory Visit: Payer: Self-pay

## 2022-04-01 ENCOUNTER — Ambulatory Visit
Admission: RE | Admit: 2022-04-01 | Discharge: 2022-04-01 | Disposition: A | Discharge status: Home / Self Care | Payer: Medicare Other | Source: Ambulatory Visit | Attending: Radiation Oncology | Admitting: Radiation Oncology

## 2022-04-01 DIAGNOSIS — Z170 Estrogen receptor positive status [ER+]: Secondary | ICD-10-CM | POA: Diagnosis not present

## 2022-04-01 DIAGNOSIS — C50411 Malignant neoplasm of upper-outer quadrant of right female breast: Secondary | ICD-10-CM | POA: Diagnosis not present

## 2022-04-01 DIAGNOSIS — Z510 Encounter for antineoplastic radiation therapy: Secondary | ICD-10-CM | POA: Diagnosis not present

## 2022-04-01 LAB — RAD ONC ARIA SESSION SUMMARY
Course Elapsed Days: 2
Plan Fractions Treated to Date: 3
Plan Prescribed Dose Per Fraction: 2.66 Gy
Plan Total Fractions Prescribed: 16
Plan Total Prescribed Dose: 42.56 Gy
Reference Point Dosage Given to Date: 7.98 Gy
Reference Point Session Dosage Given: 2.66 Gy
Session Number: 3

## 2022-04-02 ENCOUNTER — Ambulatory Visit
Admission: RE | Admit: 2022-04-02 | Discharge: 2022-04-02 | Disposition: A | Discharge status: Home / Self Care | Payer: Medicare Other | Source: Ambulatory Visit | Attending: Radiation Oncology | Admitting: Radiation Oncology

## 2022-04-02 ENCOUNTER — Other Ambulatory Visit: Payer: Self-pay

## 2022-04-02 DIAGNOSIS — Z170 Estrogen receptor positive status [ER+]: Secondary | ICD-10-CM | POA: Diagnosis not present

## 2022-04-02 DIAGNOSIS — Z510 Encounter for antineoplastic radiation therapy: Secondary | ICD-10-CM | POA: Diagnosis not present

## 2022-04-02 DIAGNOSIS — C50411 Malignant neoplasm of upper-outer quadrant of right female breast: Secondary | ICD-10-CM | POA: Diagnosis not present

## 2022-04-02 LAB — RAD ONC ARIA SESSION SUMMARY
Course Elapsed Days: 3
Plan Fractions Treated to Date: 4
Plan Prescribed Dose Per Fraction: 2.66 Gy
Plan Total Fractions Prescribed: 16
Plan Total Prescribed Dose: 42.56 Gy
Reference Point Dosage Given to Date: 10.64 Gy
Reference Point Session Dosage Given: 2.66 Gy
Session Number: 4

## 2022-04-02 MED ORDER — RADIAPLEXRX EX GEL: Freq: Once | CUTANEOUS | Status: AC

## 2022-04-02 MED ORDER — ALRA NON-METALLIC DEODORANT (RAD-ONC)
1.0000 | Freq: Once | TOPICAL | Status: AC
  Administered 2022-04-02: 1 via TOPICAL

## 2022-04-06 ENCOUNTER — Inpatient Hospital Stay: Payer: Medicare Other

## 2022-04-06 ENCOUNTER — Ambulatory Visit
Admission: RE | Admit: 2022-04-06 | Discharge: 2022-04-06 | Disposition: A | Discharge status: Home / Self Care | Payer: Medicare Other | Source: Ambulatory Visit | Attending: Radiation Oncology | Admitting: Radiation Oncology

## 2022-04-06 ENCOUNTER — Other Ambulatory Visit: Payer: Self-pay

## 2022-04-06 VITALS — BP 140/65 | HR 94 | Temp 98.0°F | Resp 18 | Wt 136.8 lb

## 2022-04-06 DIAGNOSIS — C50411 Malignant neoplasm of upper-outer quadrant of right female breast: Secondary | ICD-10-CM | POA: Diagnosis not present

## 2022-04-06 DIAGNOSIS — Z170 Estrogen receptor positive status [ER+]: Secondary | ICD-10-CM | POA: Diagnosis not present

## 2022-04-06 DIAGNOSIS — Z510 Encounter for antineoplastic radiation therapy: Secondary | ICD-10-CM | POA: Diagnosis not present

## 2022-04-06 DIAGNOSIS — Z95828 Presence of other vascular implants and grafts: Secondary | ICD-10-CM

## 2022-04-06 LAB — RAD ONC ARIA SESSION SUMMARY
Course Elapsed Days: 7
Plan Fractions Treated to Date: 5
Plan Prescribed Dose Per Fraction: 2.66 Gy
Plan Total Fractions Prescribed: 16
Plan Total Prescribed Dose: 42.56 Gy
Reference Point Dosage Given to Date: 13.3 Gy
Reference Point Session Dosage Given: 2.66 Gy
Session Number: 5

## 2022-04-06 MED ORDER — TRASTUZUMAB-HYALURONIDASE-OYSK 600-10000 MG-UNT/5ML ~~LOC~~ SOLN
600.0000 mg | Freq: Once | SUBCUTANEOUS | Status: AC
  Administered 2022-04-06: 600 mg via SUBCUTANEOUS
  Filled 2022-04-06: qty 5

## 2022-04-06 MED ORDER — ACETAMINOPHEN 325 MG PO TABS
650.0000 mg | ORAL_TABLET | Freq: Once | ORAL | Status: AC
  Administered 2022-04-06: 650 mg via ORAL
  Filled 2022-04-06: qty 2

## 2022-04-06 MED ORDER — HEPARIN SOD (PORK) LOCK FLUSH 100 UNIT/ML IV SOLN: 500.0000 [IU] | Freq: Once | INTRAVENOUS | Status: DC

## 2022-04-06 MED ORDER — SODIUM CHLORIDE 0.9% FLUSH: 10.0000 mL | Freq: Once | INTRAVENOUS | Status: DC

## 2022-04-06 NOTE — Patient Instructions (Signed)
[facility]COL[facility]  Discharge Instructions: Thank you for choosing [facility] to provide your oncology and hematology care.   If you have a lab appointment with the [facility], please go directly to the [facility] and check in at the registration area.   Wear comfortable clothing and clothing appropriate for easy access to any Portacath or PICC line.   We strive to give you quality time with your provider. You may need to reschedule your appointment if you arrive late (15 or more minutes).  Arriving late affects you and other patients whose appointments are after yours.  Also, if you miss three or more appointments without notifying the office, you may be dismissed from the clinic at the provider's discretion.      For prescription refill requests, have your pharmacy contact our office and allow 72 hours for refills to be completed.    Today you received the following chemotherapy and/or immunotherapy agents; Herceptin Hylecta       To help prevent nausea and vomiting after your treatment, we encourage you to take your nausea medication as directed.  BELOW ARE SYMPTOMS THAT SHOULD BE REPORTED IMMEDIATELY: *FEVER GREATER THAN 100.4 F (38 C) OR HIGHER *CHILLS OR SWEATING *NAUSEA AND VOMITING THAT IS NOT CONTROLLED WITH YOUR NAUSEA MEDICATION *UNUSUAL SHORTNESS OF BREATH *UNUSUAL BRUISING OR BLEEDING *URINARY PROBLEMS (pain or burning when urinating, or frequent urination) *BOWEL PROBLEMS (unusual diarrhea, constipation, pain near the anus) TENDERNESS IN MOUTH AND THROAT WITH OR WITHOUT PRESENCE OF ULCERS (sore throat, sores in mouth, or a toothache) UNUSUAL RASH, SWELLING OR PAIN  UNUSUAL VAGINAL DISCHARGE OR ITCHING   Items with * indicate a potential emergency and should be followed up as soon as possible or go to the Emergency Department if any problems should occur.  Please show the CHEMOTHERAPY ALERT CARD or IMMUNOTHERAPY ALERT CARD at  check-in to the Emergency Department and triage nurse.  Should you have questions after your visit or need to cancel or reschedule your appointment, please contact [facility]  Dept: [phone]  and follow the prompts.  Office hours are 8:00 a.m. to 4:30 p.m. Monday - Friday. Please note that voicemails left after 4:00 p.m. may not be returned until the following business day.  We are closed weekends and major holidays. You have access to a nurse at all times for urgent questions. Please call the main number to the clinic Dept: [phone] and follow the prompts.   For any non-urgent questions, you may also contact your provider using MyChart. We now offer e-Visits for anyone 31 and older to request care online for non-urgent symptoms. For details visit [email or web address].   Also download the MyChart app! Go to the app store, search "MyChart", open the app, select [facility], and log in with your MyChart username and password.  Masks are optional in the cancer centers. If you would like for your care team to wear a mask while they are taking care of you, please let them know. You may have one support Rhonda Richardson who is at least 79 years old accompany you for your appointments.

## 2022-04-07 ENCOUNTER — Ambulatory Visit
Admission: RE | Admit: 2022-04-07 | Discharge: 2022-04-07 | Disposition: A | Discharge status: Home / Self Care | Payer: Medicare Other | Source: Ambulatory Visit | Attending: Radiation Oncology | Admitting: Radiation Oncology

## 2022-04-07 ENCOUNTER — Other Ambulatory Visit: Payer: Self-pay

## 2022-04-07 DIAGNOSIS — Z510 Encounter for antineoplastic radiation therapy: Secondary | ICD-10-CM | POA: Diagnosis not present

## 2022-04-07 DIAGNOSIS — C50411 Malignant neoplasm of upper-outer quadrant of right female breast: Secondary | ICD-10-CM | POA: Diagnosis not present

## 2022-04-07 DIAGNOSIS — Z170 Estrogen receptor positive status [ER+]: Secondary | ICD-10-CM | POA: Diagnosis not present

## 2022-04-07 LAB — RAD ONC ARIA SESSION SUMMARY
Course Elapsed Days: 8
Plan Fractions Treated to Date: 6
Plan Prescribed Dose Per Fraction: 2.66 Gy
Plan Total Fractions Prescribed: 16
Plan Total Prescribed Dose: 42.56 Gy
Reference Point Dosage Given to Date: 15.96 Gy
Reference Point Session Dosage Given: 2.66 Gy
Session Number: 6

## 2022-04-08 ENCOUNTER — Other Ambulatory Visit: Payer: Self-pay

## 2022-04-08 ENCOUNTER — Ambulatory Visit
Admission: RE | Admit: 2022-04-08 | Discharge: 2022-04-08 | Disposition: A | Discharge status: Home / Self Care | Payer: Medicare Other | Source: Ambulatory Visit | Attending: Radiation Oncology | Admitting: Radiation Oncology

## 2022-04-08 DIAGNOSIS — Z170 Estrogen receptor positive status [ER+]: Secondary | ICD-10-CM | POA: Diagnosis not present

## 2022-04-08 DIAGNOSIS — Z510 Encounter for antineoplastic radiation therapy: Secondary | ICD-10-CM | POA: Diagnosis not present

## 2022-04-08 DIAGNOSIS — C50411 Malignant neoplasm of upper-outer quadrant of right female breast: Secondary | ICD-10-CM | POA: Diagnosis not present

## 2022-04-08 LAB — RAD ONC ARIA SESSION SUMMARY
Course Elapsed Days: 9
Plan Fractions Treated to Date: 7
Plan Prescribed Dose Per Fraction: 2.66 Gy
Plan Total Fractions Prescribed: 16
Plan Total Prescribed Dose: 42.56 Gy
Reference Point Dosage Given to Date: 18.62 Gy
Reference Point Session Dosage Given: 2.66 Gy
Session Number: 7

## 2022-04-09 ENCOUNTER — Ambulatory Visit
Admission: RE | Admit: 2022-04-09 | Discharge: 2022-04-09 | Disposition: A | Discharge status: Home / Self Care | Payer: Medicare Other | Source: Ambulatory Visit | Attending: Radiation Oncology | Admitting: Radiation Oncology

## 2022-04-09 ENCOUNTER — Other Ambulatory Visit: Payer: Self-pay

## 2022-04-09 DIAGNOSIS — Z510 Encounter for antineoplastic radiation therapy: Secondary | ICD-10-CM | POA: Diagnosis not present

## 2022-04-09 DIAGNOSIS — C50411 Malignant neoplasm of upper-outer quadrant of right female breast: Secondary | ICD-10-CM | POA: Diagnosis not present

## 2022-04-09 DIAGNOSIS — Z170 Estrogen receptor positive status [ER+]: Secondary | ICD-10-CM | POA: Diagnosis not present

## 2022-04-09 LAB — RAD ONC ARIA SESSION SUMMARY
Course Elapsed Days: 10
Plan Fractions Treated to Date: 8
Plan Prescribed Dose Per Fraction: 2.66 Gy
Plan Total Fractions Prescribed: 16
Plan Total Prescribed Dose: 42.56 Gy
Reference Point Dosage Given to Date: 21.28 Gy
Reference Point Session Dosage Given: 2.66 Gy
Session Number: 8

## 2022-04-13 ENCOUNTER — Ambulatory Visit
Admission: RE | Admit: 2022-04-13 | Discharge: 2022-04-13 | Disposition: A | Discharge status: Home / Self Care | Payer: Medicare Other | Source: Ambulatory Visit | Attending: Radiation Oncology | Admitting: Radiation Oncology

## 2022-04-13 ENCOUNTER — Encounter: Payer: Self-pay | Admitting: Hematology and Oncology

## 2022-04-13 ENCOUNTER — Other Ambulatory Visit: Payer: Self-pay

## 2022-04-13 ENCOUNTER — Encounter: Payer: Self-pay | Admitting: Physician Assistant

## 2022-04-13 DIAGNOSIS — C50411 Malignant neoplasm of upper-outer quadrant of right female breast: Secondary | ICD-10-CM | POA: Diagnosis not present

## 2022-04-13 DIAGNOSIS — Z510 Encounter for antineoplastic radiation therapy: Secondary | ICD-10-CM | POA: Insufficient documentation

## 2022-04-13 DIAGNOSIS — Z170 Estrogen receptor positive status [ER+]: Secondary | ICD-10-CM | POA: Diagnosis not present

## 2022-04-13 LAB — RAD ONC ARIA SESSION SUMMARY
Course Elapsed Days: 14
Plan Fractions Treated to Date: 9
Plan Prescribed Dose Per Fraction: 2.66 Gy
Plan Total Fractions Prescribed: 16
Plan Total Prescribed Dose: 42.56 Gy
Reference Point Dosage Given to Date: 23.94 Gy
Reference Point Session Dosage Given: 2.66 Gy
Session Number: 9

## 2022-04-14 ENCOUNTER — Ambulatory Visit
Admission: RE | Admit: 2022-04-14 | Discharge: 2022-04-14 | Disposition: A | Discharge status: Home / Self Care | Payer: Medicare Other | Source: Ambulatory Visit | Attending: Radiation Oncology | Admitting: Radiation Oncology

## 2022-04-14 ENCOUNTER — Other Ambulatory Visit: Payer: Self-pay

## 2022-04-14 DIAGNOSIS — Z170 Estrogen receptor positive status [ER+]: Secondary | ICD-10-CM | POA: Diagnosis not present

## 2022-04-14 DIAGNOSIS — Z510 Encounter for antineoplastic radiation therapy: Secondary | ICD-10-CM | POA: Diagnosis not present

## 2022-04-14 DIAGNOSIS — C50411 Malignant neoplasm of upper-outer quadrant of right female breast: Secondary | ICD-10-CM | POA: Diagnosis not present

## 2022-04-14 LAB — RAD ONC ARIA SESSION SUMMARY
Course Elapsed Days: 15
Plan Fractions Treated to Date: 10
Plan Prescribed Dose Per Fraction: 2.66 Gy
Plan Total Fractions Prescribed: 16
Plan Total Prescribed Dose: 42.56 Gy
Reference Point Dosage Given to Date: 26.6 Gy
Reference Point Session Dosage Given: 2.66 Gy
Session Number: 10

## 2022-04-15 ENCOUNTER — Ambulatory Visit
Admission: RE | Admit: 2022-04-15 | Discharge: 2022-04-15 | Disposition: A | Discharge status: Home / Self Care | Payer: Medicare Other | Source: Ambulatory Visit | Attending: Radiation Oncology | Admitting: Radiation Oncology

## 2022-04-15 ENCOUNTER — Other Ambulatory Visit: Payer: Self-pay

## 2022-04-15 DIAGNOSIS — Z510 Encounter for antineoplastic radiation therapy: Secondary | ICD-10-CM | POA: Diagnosis not present

## 2022-04-15 DIAGNOSIS — C50411 Malignant neoplasm of upper-outer quadrant of right female breast: Secondary | ICD-10-CM | POA: Diagnosis not present

## 2022-04-15 DIAGNOSIS — Z170 Estrogen receptor positive status [ER+]: Secondary | ICD-10-CM | POA: Diagnosis not present

## 2022-04-15 LAB — RAD ONC ARIA SESSION SUMMARY
Course Elapsed Days: 16
Plan Fractions Treated to Date: 11
Plan Prescribed Dose Per Fraction: 2.66 Gy
Plan Total Fractions Prescribed: 16
Plan Total Prescribed Dose: 42.56 Gy
Reference Point Dosage Given to Date: 29.26 Gy
Reference Point Session Dosage Given: 2.66 Gy
Session Number: 11

## 2022-04-16 ENCOUNTER — Ambulatory Visit
Admission: RE | Admit: 2022-04-16 | Discharge: 2022-04-16 | Disposition: A | Discharge status: Home / Self Care | Payer: Medicare Other | Source: Ambulatory Visit | Attending: Radiation Oncology | Admitting: Radiation Oncology

## 2022-04-16 ENCOUNTER — Other Ambulatory Visit: Payer: Self-pay

## 2022-04-16 DIAGNOSIS — C50411 Malignant neoplasm of upper-outer quadrant of right female breast: Secondary | ICD-10-CM | POA: Diagnosis not present

## 2022-04-16 DIAGNOSIS — Z170 Estrogen receptor positive status [ER+]: Secondary | ICD-10-CM | POA: Diagnosis not present

## 2022-04-16 DIAGNOSIS — Z510 Encounter for antineoplastic radiation therapy: Secondary | ICD-10-CM | POA: Diagnosis not present

## 2022-04-16 LAB — RAD ONC ARIA SESSION SUMMARY
Course Elapsed Days: 17
Plan Fractions Treated to Date: 12
Plan Prescribed Dose Per Fraction: 2.66 Gy
Plan Total Fractions Prescribed: 16
Plan Total Prescribed Dose: 42.56 Gy
Reference Point Dosage Given to Date: 31.92 Gy
Reference Point Session Dosage Given: 2.66 Gy
Session Number: 12

## 2022-04-19 ENCOUNTER — Other Ambulatory Visit: Payer: Self-pay

## 2022-04-19 ENCOUNTER — Ambulatory Visit
Admission: RE | Admit: 2022-04-19 | Discharge: 2022-04-19 | Disposition: A | Discharge status: Home / Self Care | Payer: Medicare Other | Source: Ambulatory Visit | Attending: Radiation Oncology | Admitting: Radiation Oncology

## 2022-04-19 DIAGNOSIS — Z170 Estrogen receptor positive status [ER+]: Secondary | ICD-10-CM | POA: Diagnosis not present

## 2022-04-19 DIAGNOSIS — Z510 Encounter for antineoplastic radiation therapy: Secondary | ICD-10-CM | POA: Diagnosis not present

## 2022-04-19 DIAGNOSIS — C50411 Malignant neoplasm of upper-outer quadrant of right female breast: Secondary | ICD-10-CM | POA: Diagnosis not present

## 2022-04-19 LAB — RAD ONC ARIA SESSION SUMMARY
Course Elapsed Days: 20
Plan Fractions Treated to Date: 13
Plan Prescribed Dose Per Fraction: 2.66 Gy
Plan Total Fractions Prescribed: 16
Plan Total Prescribed Dose: 42.56 Gy
Reference Point Dosage Given to Date: 34.58 Gy
Reference Point Session Dosage Given: 2.66 Gy
Session Number: 13

## 2022-04-20 ENCOUNTER — Ambulatory Visit
Admission: RE | Admit: 2022-04-20 | Discharge: 2022-04-20 | Disposition: A | Discharge status: Home / Self Care | Payer: Medicare Other | Source: Ambulatory Visit | Attending: Radiation Oncology | Admitting: Radiation Oncology

## 2022-04-20 ENCOUNTER — Other Ambulatory Visit: Payer: Self-pay

## 2022-04-20 DIAGNOSIS — Z170 Estrogen receptor positive status [ER+]: Secondary | ICD-10-CM | POA: Diagnosis not present

## 2022-04-20 DIAGNOSIS — Z510 Encounter for antineoplastic radiation therapy: Secondary | ICD-10-CM | POA: Diagnosis not present

## 2022-04-20 DIAGNOSIS — C50411 Malignant neoplasm of upper-outer quadrant of right female breast: Secondary | ICD-10-CM | POA: Diagnosis not present

## 2022-04-20 LAB — RAD ONC ARIA SESSION SUMMARY
Course Elapsed Days: 21
Plan Fractions Treated to Date: 14
Plan Prescribed Dose Per Fraction: 2.66 Gy
Plan Total Fractions Prescribed: 16
Plan Total Prescribed Dose: 42.56 Gy
Reference Point Dosage Given to Date: 37.24 Gy
Reference Point Session Dosage Given: 2.66 Gy
Session Number: 14

## 2022-04-21 ENCOUNTER — Ambulatory Visit: Payer: Medicare Other | Attending: Hematology and Oncology

## 2022-04-21 ENCOUNTER — Other Ambulatory Visit: Payer: Self-pay

## 2022-04-21 ENCOUNTER — Ambulatory Visit
Admission: RE | Admit: 2022-04-21 | Discharge: 2022-04-21 | Disposition: A | Discharge status: Home / Self Care | Payer: Medicare Other | Source: Ambulatory Visit | Attending: Radiation Oncology | Admitting: Radiation Oncology

## 2022-04-21 DIAGNOSIS — Z9181 History of falling: Secondary | ICD-10-CM | POA: Diagnosis not present

## 2022-04-21 DIAGNOSIS — R252 Cramp and spasm: Secondary | ICD-10-CM | POA: Diagnosis not present

## 2022-04-21 DIAGNOSIS — M6281 Muscle weakness (generalized): Secondary | ICD-10-CM

## 2022-04-21 DIAGNOSIS — Z170 Estrogen receptor positive status [ER+]: Secondary | ICD-10-CM | POA: Diagnosis not present

## 2022-04-21 DIAGNOSIS — R262 Difficulty in walking, not elsewhere classified: Secondary | ICD-10-CM | POA: Diagnosis not present

## 2022-04-21 DIAGNOSIS — C50411 Malignant neoplasm of upper-outer quadrant of right female breast: Secondary | ICD-10-CM | POA: Diagnosis not present

## 2022-04-21 DIAGNOSIS — Z510 Encounter for antineoplastic radiation therapy: Secondary | ICD-10-CM | POA: Diagnosis not present

## 2022-04-21 LAB — RAD ONC ARIA SESSION SUMMARY
Course Elapsed Days: 22
Plan Fractions Treated to Date: 15
Plan Prescribed Dose Per Fraction: 2.66 Gy
Plan Total Fractions Prescribed: 16
Plan Total Prescribed Dose: 42.56 Gy
Reference Point Dosage Given to Date: 39.9 Gy
Reference Point Session Dosage Given: 2.66 Gy
Session Number: 15

## 2022-04-21 NOTE — Therapy (Signed)
OUTPATIENT PHYSICAL THERAPY RE-ASSESSMENT NOTE   Patient Name: Rhonda Richardson MRN: [id number] DOB:1940-10-22, 82 y.o., female Today's Date: 04/21/2022  PCP: Collene Leyden, MD REFERRING PROVIDER: Benay Pike, MD   PT End of Session - 04/21/22 1023     Visit Number 16    Date for PT Re-Evaluation 05/26/22    Authorization Type Medicare    Progress Note Due on Visit 20    PT Start Time 1016    PT Stop Time 1100    PT Time Calculation (min) 44 min    Activity Tolerance Patient tolerated treatment well    Behavior During Therapy WFL for tasks assessed/performed             Past Medical History:  Diagnosis Date   Breast cancer ([place])    Hypertension    Past Surgical History:  Procedure Laterality Date   BREAST LUMPECTOMY WITH RADIOACTIVE SEED AND SENTINEL LYMPH NODE BIOPSY Right 09/15/2021   Procedure: RIGHT BREAST SEED BRACKETED LUMPECTOMY AND SENTINEL NODE BIOPSY;  Surgeon: [name], MD;  Location: [facility];  Service: General;  Laterality: Right;   cataract surgery Bilateral    DILATION AND CURETTAGE OF UTERUS  1970   EXCISION OF BREAST LESION Right 10/15/2021   Procedure: ASPIRATION RIGHT AXILLARY SEROMA;  Surgeon: [name], MD;  Location: [facility];  Service: General;  Laterality: Right;   eyelid tendon repair Bilateral    PORTACATH PLACEMENT N/A 09/15/2021   Procedure: PORT PLACEMENT;  Surgeon: [name], MD;  Location: [facility];  Service: General;  Laterality: N/A;   RE-EXCISION OF BREAST LUMPECTOMY Right 10/15/2021   Procedure: RE-EXCISION LUMPECTOMY RIGHT BREAST;  Surgeon: [name], MD;  Location: [facility];  Service: General;  Laterality: Right;   Patient Active Problem List   Diagnosis Date Noted   Port-A-Cath in place 11/18/2021   Genetic testing 09/28/2021   Malignant neoplasm of upper-outer quadrant of right breast in female, estrogen receptor positive ([facility]) 09/08/2021    REFERRING DIAG: right breast cancer at risk  for lymphedema  THERAPY DIAG:  Muscle weakness (generalized)  History of falling  Difficulty in walking, not elsewhere classified  PERTINENT HISTORY: right UE Lymphedema risk, None   PRECAUTIONS: right UE Lymphedema risk, None  SUBJECTIVE: Pt returns for therapy following holidays and radiation.  She has 5 more sessions of radiation. She explains that she is surprised that she is feeling pretty well despite all of the radiation but wants to be sure she hasn't lost any ground and wants to do a little more PT for her legs and balance.  I am having a little discomfort in my right hip.  "Kind of a tweak every now and then in the buttock area".   PAIN:  Are you having pain? No  SOZO SCREENING: Patient was assessed today using the SOZO machine to determine the lymphedema index score. This was compared to her baseline score. It was determined that she is within the recommended range when compared to her baseline and no further action is needed at this time. She will continue SOZO screenings. These are done every 3 months for 2 years post operatively followed by every 6 months for 2 years, and then annually.       Isabel Caprice, PT 04/21/2022, 2:17 PM   Ortho Re-assessment visit:   PRECAUTIONS: Other: Breast cancer   WEIGHT BEARING RESTRICTIONS No   FALLS:  Has patient fallen in last 6 months? Yes on 11/13/2021 she fell  after the chemotherapy treatment due to sciatica.  LIVING ENVIRONMENT: Lives with: lives with their spouse   OCCUPATION: part time   PLOF: Independent   PATIENT GOALS pelvic floor education   PERTINENT HISTORY:  Patient was diagnosed on 08/12/2021 with right grade 2 invasive ductal carcinoma breast cancer. She had a right lumpectomy and sentinel node biopsy (0/4 nodes positive) on 09/15/2021 followed by a re-excision on 10/15/2021. It is triple positive with a Ki67 of 30%. Presently having chemotherapy.    BOWEL MOVEMENT Pain with bowel movement: No    URINATION Pain with urination: No Fully empty bladder: No   INTERCOURSE Pain with intercourse:  none and uses lubricant   PREGNANCY Vaginal deliveries 3 Tearing No C-section deliveries 0 Currently pregnant No   OBJECTIVE:    DIAGNOSTIC FINDINGS:  none     COGNITION:            Overall cognitive status: Within functional limits for tasks assessed                          SENSATION:            Light touch: Appears intact on left, decreased on lateral and medial right lower leg            Proprioception: Appears intact                  POSTURE: No Significant postural limitations     Sit to stand 5 times 15 seconds.  TUG 11 sec Stand on right leg 3 sec Stand on left leg 15 sec with no issues Tandem stance with left leg in front 30 sec Tandem stance with right leg in front  30 sec with increased body sway     Recheck 10/26:  5x sit to stand:  12 sec                             TUG:  7 sec  04/21/22: 5 x sit to stand:  8 sec               TUG: 6.07 sec                 PELVIC ALIGNMENT:   LUMBARAROM/PROM   A/PROM A/PROM  eval 9/21  Extension Decreased by 25% WFLs    LOWER EXTREMITY WJX:[id number] hip ROM is full   LOWER EXTREMITY MMT:   MMT Right eval Left eval 9/21 10/31 04/21/22  Hip flexion 4/5 5/5     4-  Hip extension 4/5 4/5 4 right/left  4+/5 left/right   Hip abduction 3+/5 3+/5 4- right/left  4+/5 left/right 4+  Hip external rotation 4/5 4/5     4  Knee flexion 5/5 5/5     5  Knee extension 4/5 5/5     5  Ankle dorsiflexion 4/5 5/5 4+ right  4+/5 right 4+  Ankle plantarflexion 2/5 3/5     4  Ankle inversion 5/5 5/5     5  Ankle eversion 5/5 5/5     5     PALPATION:   General  no tenderness                       TODAY'S TREATMENT  04/21/22: Re-evaluation visit Nu-step x 5 min level Sit to stand x 5 Marching on balance pad x 20 Lateral band walks x  5 laps at back counter Squats on balance pad x 20 Instructed in various piriformis  stretches for her right hip discomfort  11/16: Recumbent bike x 5 min   Sit to stand from mat table 1 x 10 Squat to table 1 x 10 Lateral band walks with blue loop 5 laps along barre Foot on balance pad cross chop with red physio ball to opposite knee x 10 each side Step up on balance pad with red physio ball overhead to knee flexion x 10 each side Step ups 8" 2 x 10 each LE March on balance pad x 20 Squats on balance pad 2 x 10 Cone touches standing on floor (cone on 8" step for increased difficulty) 3 x 10 each LE Deeper squat to 2 balance pads on 8" step UE use to push up but also attempted without UE use.   Trunk rotation at Matrix with 5 lb  2 x 10 each side   11/14: Discussion about not getting her immune booster. She was upset that her doctor was not aware that they did not give this to her after last chemo.   Sit to stand from mat table 1 x 10 Squat to table 1 x 10 Step ups 8" 2 x 10 each LE Lateral band walks with blue loop 5 laps along barre Deeper squat to balance pad on footstool UE use at barre. Step up and hold on balance pad x 10 each LE fwd then lateral March on balance pad x 20 Squats on balance pad 2 x 10 Cone touches standing on floor (cone on mat table) 3 x 10 each LE Trunk rotation at Matrix with 5 lb  2 x 10 each side     PATIENT EDUCATION:  Education details: education on vaginal moisturizers and lubricants. Handed patient samples of each.  Person educated: Patient Education method: Theatre stage manager Education comprehension: verbalized understanding     HOME EXERCISE PROGRAM: Access Code: [email or web address] URL: [email or web address] Date: 02/04/2022 Prepared by: Ruben Im   Exercises - Seated Hip External Rotation Stretch  - 1 x daily - 7 x weekly - 1 sets - 3 reps - 30 hold - Seated Slump Nerve Glide  - 1 x daily - 7 x weekly - 1 sets - 5 reps - Seated March  - 1 x daily - 7 x weekly - 1 sets - 10 reps - Sit to Stand  - 1 x daily -  7 x weekly - 1 sets - 10 reps - Seated Hip Abduction with Resistance  - 1 x daily - 7 x weekly - 1 sets - 10 reps - Forward Step Up with Counter Support  - 1 x daily - 7 x weekly - 1 sets - 10 reps - Seated Hip Flexion March with Ankle Weights  - 1 x daily - 7 x weekly - 10 reps - Seated Knee Extension with Resistance  - 1 x daily - 7 x weekly - 1 sets - 10 reps Given red band for home   ASSESSMENT:   CLINICAL IMPRESSION: Zerah continues to show good progress and is doing her HEP when she is feeling well.  She has lost some proximal strength in and around the hips and lower abdominals but overall objective findings are improved.    She would benefit from continuing skilled PT for proximal strength and balance training.       OBJECTIVE IMPAIRMENTS Abnormal gait, decreased activity tolerance, decreased balance, decreased coordination, decreased endurance, difficulty walking, and  decreased strength.    ACTIVITY LIMITATIONS lifting, bending, squatting, stairs, transfers, and locomotion level   PARTICIPATION LIMITATIONS: cleaning, laundry, shopping, community activity, and occupation   PERSONAL FACTORS Age, Fitness, Past/current experiences, and 1-2 comorbidities: breast cancer and having chemotherapy  are also affecting patient's functional outcome.    REHAB POTENTIAL: Excellent   CLINICAL DECISION MAKING: Evolving/moderate complexity   EVALUATION COMPLEXITY: Moderate     GOALS: Goals reviewed with patient? Yes   SHORT TERM GOALS: Target date: 01/13/2022   Patient is independent with initial HEP for balance and strength.  Baseline: Goal status: goal met 10/26   2.  Patient reports her endurance and steadiness on her right leg has improved >/= 25% the 2 weeks prior to her chemotherapy treatments.  Baseline:  Goal status: met 10/26   3.  Patient educated on vaginal moisturizers and lubricants to promote good vaginal health.  Baseline:  Goal status: MET 12/16/2021   LONG TERM  GOALS: Target date: 03/10/2022    Patient independent with advanced HEP for balance and strength to reduce the chance of falls.  Baseline:  Goal status: INITIAL   2.  Sit to stand 5x is </= 12.6 seconds to reduce the chance of her falling.  Baseline:  Goal status: goal met 10/26   3.  Patient able to stand on right leg for 15 seconds so she is able to go up and down steps with step over step pattern.  Baseline: 04/21/22 8 sec left, 15 sec right Goal status: INITIAL   4.  Tandem stance with right leg for 30 seconds with </= 50% less body swaying due to improved strength and balance.  Baseline:  Goal status:MET   5.  Patient is able to squat and stand up with >/= 50% greater ease due to increased in right lower extremity strength.  Baseline:  Goal status: INITIAL     PLAN: PT FREQUENCY: 2x/week   PT DURATION: 12 weeks   PLANNED INTERVENTIONS: Therapeutic exercises, Therapeutic activity, Neuromuscular re-education, Balance training, Gait training, Patient/Family education, Self Care, and Stair training   PLAN FOR NEXT SESSION:   continue 1-2  times per week x 5 weeks.  Patient will be out of town one of these weeks.  Therefore treatment weeks will be a total of 4 weeks.  Focus on leg strength, functional training and balance training;

## 2022-04-22 ENCOUNTER — Other Ambulatory Visit: Payer: Self-pay

## 2022-04-22 ENCOUNTER — Ambulatory Visit
Admission: RE | Admit: 2022-04-22 | Discharge: 2022-04-22 | Disposition: A | Discharge status: Home / Self Care | Payer: Medicare Other | Source: Ambulatory Visit | Attending: Radiation Oncology | Admitting: Radiation Oncology

## 2022-04-22 DIAGNOSIS — Z510 Encounter for antineoplastic radiation therapy: Secondary | ICD-10-CM | POA: Diagnosis not present

## 2022-04-22 DIAGNOSIS — Z170 Estrogen receptor positive status [ER+]: Secondary | ICD-10-CM | POA: Diagnosis not present

## 2022-04-22 DIAGNOSIS — C50411 Malignant neoplasm of upper-outer quadrant of right female breast: Secondary | ICD-10-CM | POA: Diagnosis not present

## 2022-04-22 LAB — RAD ONC ARIA SESSION SUMMARY
Course Elapsed Days: 23
Plan Fractions Treated to Date: 16
Plan Prescribed Dose Per Fraction: 2.66 Gy
Plan Total Fractions Prescribed: 16
Plan Total Prescribed Dose: 42.56 Gy
Reference Point Dosage Given to Date: 42.56 Gy
Reference Point Session Dosage Given: 2.66 Gy
Session Number: 16

## 2022-04-23 ENCOUNTER — Ambulatory Visit
Admission: RE | Admit: 2022-04-23 | Discharge: 2022-04-23 | Disposition: A | Discharge status: Home / Self Care | Payer: Medicare Other | Source: Ambulatory Visit | Attending: Radiation Oncology | Admitting: Radiation Oncology

## 2022-04-23 ENCOUNTER — Other Ambulatory Visit: Payer: Self-pay

## 2022-04-23 DIAGNOSIS — Z170 Estrogen receptor positive status [ER+]: Secondary | ICD-10-CM

## 2022-04-23 DIAGNOSIS — Z510 Encounter for antineoplastic radiation therapy: Secondary | ICD-10-CM | POA: Diagnosis not present

## 2022-04-23 DIAGNOSIS — C50411 Malignant neoplasm of upper-outer quadrant of right female breast: Secondary | ICD-10-CM | POA: Diagnosis not present

## 2022-04-23 LAB — RAD ONC ARIA SESSION SUMMARY
Course Elapsed Days: 24
Plan Fractions Treated to Date: 1
Plan Prescribed Dose Per Fraction: 2 Gy
Plan Total Fractions Prescribed: 4
Plan Total Prescribed Dose: 8 Gy
Reference Point Dosage Given to Date: 2 Gy
Reference Point Session Dosage Given: 2 Gy
Session Number: 17

## 2022-04-26 ENCOUNTER — Inpatient Hospital Stay: Payer: Medicare Other

## 2022-04-26 ENCOUNTER — Inpatient Hospital Stay (HOSPITAL_BASED_OUTPATIENT_CLINIC_OR_DEPARTMENT_OTHER): Payer: Medicare Other | Admitting: Hematology and Oncology

## 2022-04-26 ENCOUNTER — Encounter: Payer: Self-pay | Admitting: Hematology and Oncology

## 2022-04-26 ENCOUNTER — Ambulatory Visit
Admission: RE | Admit: 2022-04-26 | Discharge: 2022-04-26 | Disposition: A | Discharge status: Home / Self Care | Payer: Medicare Other | Source: Ambulatory Visit | Attending: Radiation Oncology | Admitting: Radiation Oncology

## 2022-04-26 ENCOUNTER — Other Ambulatory Visit: Payer: Self-pay

## 2022-04-26 VITALS — BP 140/69 | HR 81 | Temp 97.8°F | Resp 18

## 2022-04-26 DIAGNOSIS — Z8249 Family history of ischemic heart disease and other diseases of the circulatory system: Secondary | ICD-10-CM | POA: Insufficient documentation

## 2022-04-26 DIAGNOSIS — Z808 Family history of malignant neoplasm of other organs or systems: Secondary | ICD-10-CM | POA: Insufficient documentation

## 2022-04-26 DIAGNOSIS — C50411 Malignant neoplasm of upper-outer quadrant of right female breast: Secondary | ICD-10-CM

## 2022-04-26 DIAGNOSIS — Z79899 Other long term (current) drug therapy: Secondary | ICD-10-CM | POA: Insufficient documentation

## 2022-04-26 DIAGNOSIS — Z510 Encounter for antineoplastic radiation therapy: Secondary | ICD-10-CM | POA: Diagnosis not present

## 2022-04-26 DIAGNOSIS — T451X5A Adverse effect of antineoplastic and immunosuppressive drugs, initial encounter: Secondary | ICD-10-CM | POA: Insufficient documentation

## 2022-04-26 DIAGNOSIS — Z170 Estrogen receptor positive status [ER+]: Secondary | ICD-10-CM | POA: Insufficient documentation

## 2022-04-26 DIAGNOSIS — Z5112 Encounter for antineoplastic immunotherapy: Secondary | ICD-10-CM | POA: Insufficient documentation

## 2022-04-26 DIAGNOSIS — G620 Drug-induced polyneuropathy: Secondary | ICD-10-CM | POA: Insufficient documentation

## 2022-04-26 DIAGNOSIS — Z881 Allergy status to other antibiotic agents status: Secondary | ICD-10-CM | POA: Insufficient documentation

## 2022-04-26 DIAGNOSIS — Z08 Encounter for follow-up examination after completed treatment for malignant neoplasm: Secondary | ICD-10-CM | POA: Diagnosis not present

## 2022-04-26 DIAGNOSIS — Z95828 Presence of other vascular implants and grafts: Secondary | ICD-10-CM

## 2022-04-26 LAB — CMP (CANCER CENTER ONLY)
ALT: 27 U/L (ref 0–44)
AST: 22 U/L (ref 15–41)
Albumin: 3.8 g/dL (ref 3.5–5.0)
Alkaline Phosphatase: 73 U/L (ref 38–126)
Anion gap: 6 (ref 5–15)
BUN: 33 mg/dL — ABNORMAL HIGH (ref 8–23)
CO2: 29 mmol/L (ref 22–32)
Calcium: 9.3 mg/dL (ref 8.9–10.3)
Chloride: 105 mmol/L (ref 98–111)
Creatinine: 0.76 mg/dL (ref 0.44–1.00)
GFR, Estimated: >60 mL/min (ref >60)
Glucose, Bld: 88 mg/dL (ref 70–99)
Potassium: 3.8 mmol/L (ref 3.5–5.1)
Sodium: 140 mmol/L (ref 135–145)
Total Bilirubin: 0.3 mg/dL (ref 0.3–1.2)
Total Protein: 6.5 g/dL (ref 6.5–8.1)

## 2022-04-26 LAB — RAD ONC ARIA SESSION SUMMARY
Course Elapsed Days: 27
Plan Fractions Treated to Date: 2
Plan Prescribed Dose Per Fraction: 2 Gy
Plan Total Fractions Prescribed: 4
Plan Total Prescribed Dose: 8 Gy
Reference Point Dosage Given to Date: 4 Gy
Reference Point Session Dosage Given: 2 Gy
Session Number: 18

## 2022-04-26 LAB — CBC WITH DIFFERENTIAL (CANCER CENTER ONLY)
Abs Immature Granulocytes: 0.01 10*3/uL (ref 0.00–0.07)
Basophils Absolute: 0.1 10*3/uL (ref 0.0–0.1)
Basophils Relative: 1 %
Eosinophils Absolute: 0.2 10*3/uL (ref 0.0–0.5)
Eosinophils Relative: 6 %
HCT: 31.9 % — ABNORMAL LOW (ref 36.0–46.0)
Hemoglobin: 10.5 g/dL — ABNORMAL LOW (ref 12.0–15.0)
Immature Granulocytes: 0 %
Lymphocytes Relative: 25 %
Lymphs Abs: 1 10*3/uL (ref 0.7–4.0)
MCH: 27.9 pg (ref 26.0–34.0)
MCHC: 32.9 g/dL (ref 30.0–36.0)
MCV: 84.8 fL (ref 80.0–100.0)
Monocytes Absolute: 0.3 10*3/uL (ref 0.1–1.0)
Monocytes Relative: 8 %
Neutro Abs: 2.5 10*3/uL (ref 1.7–7.7)
Neutrophils Relative %: 60 %
Platelet Count: 209 10*3/uL (ref 150–400)
RBC: 3.76 MIL/uL — ABNORMAL LOW (ref 3.87–5.11)
RDW: 13.6 % (ref 11.5–15.5)
WBC Count: 4.1 10*3/uL (ref 4.0–10.5)
nRBC: 0 % (ref 0.0–0.2)

## 2022-04-26 MED ORDER — TAMOXIFEN CITRATE 20 MG PO TABS: 20.0000 mg | ORAL_TABLET | Freq: Every day | ORAL | 3 refills | Status: DC

## 2022-04-26 MED ORDER — SODIUM CHLORIDE 0.9% FLUSH
10.0000 mL | Freq: Once | INTRAVENOUS | Status: AC
  Administered 2022-04-26: 10 mL

## 2022-04-26 MED ORDER — ACETAMINOPHEN 325 MG PO TABS
650.0000 mg | ORAL_TABLET | Freq: Once | ORAL | Status: AC
  Administered 2022-04-26: 650 mg via ORAL
  Filled 2022-04-26: qty 2

## 2022-04-26 MED ORDER — TRASTUZUMAB-HYALURONIDASE-OYSK 600-10000 MG-UNT/5ML ~~LOC~~ SOLN
600.0000 mg | Freq: Once | SUBCUTANEOUS | Status: AC
  Administered 2022-04-26: 600 mg via SUBCUTANEOUS
  Filled 2022-04-26: qty 5

## 2022-04-26 MED ORDER — HEPARIN SOD (PORK) LOCK FLUSH 100 UNIT/ML IV SOLN
500.0000 [IU] | Freq: Once | INTRAVENOUS | Status: AC
  Administered 2022-04-26: 500 [IU]

## 2022-04-26 MED ORDER — ANASTROZOLE 1 MG PO TABS: 1.0000 mg | ORAL_TABLET | Freq: Every day | ORAL | 3 refills | Status: DC

## 2022-04-26 NOTE — Progress Notes (Signed)
Rhonda Richardson  Patient Care Team: Collene Leyden, MD as PCP - General (Family Medicine) [name], MD as Consulting Physician (General Surgery) Benay Pike, MD as Consulting Physician (Hematology and Oncology) Kyung Rudd, MD as Consulting Physician (Radiation Oncology) Mauro Kaufmann, RN as Oncology Nurse Navigator Rockwell Germany, RN as Oncology Nurse Navigator  CHIEF COMPLAINTS/PURPOSE OF CONSULTATION:  Breast cancer  SUMMARY OF ONCOLOGIC HISTORY: Oncology History  Malignant neoplasm of upper-outer quadrant of right breast in female, estrogen receptor positive ([facility])  08/12/2021 Mammogram   Mammogram showed possible mass in the right breast.  Diagnostic mammogram showed 2 highly suspicious upper outer right breast masses measuring 1.5 cm in the 9:30 position and 1.4 cm at the 10 o'clock position.  These masses or distortions mammographically spanning a distance of up to 3.5 cm.  No abnormal appearing right axillary lymph nodes.   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  Prognostic showed ER 99% positive strong staining PR 10% positive moderate staining, HER2 positive 3+ and Ki-67 of 30%.   09/08/2021 Initial Diagnosis   Malignant neoplasm of upper-outer quadrant of right breast in female, estrogen receptor positive ([facility])   09/15/2021 Pathology Results   She had right breast lumpectomy which showed invasive ductal carcinoma, 3.6 cm in maximal extent involving inferior and medial margins, DCIS approaching to less than 0.1 cm of closest margin no metastatic carcinoma identified in 4 out of 4 lymph nodes.  She is scheduled for repeat surgery given positive margins.  Prior prognostic showed ER 99% positive strong staining intensity PR 10% positive moderate staining intensity, HER2 positive.   09/23/2021 Genetic Testing   Negative hereditary cancer genetic testing: no pathogenic variants  detected in [facility] CustomNext-cancer +RNAinsight Panel.  Report date is September 23, 2021.   The CustomNext-Cancer+RNAinsight panel offered by [facility] includes sequencing and rearrangement analysis for the following 47 genes:  APC, ATM, AXIN2, BARD1, BMPR1A, BRCA1, BRCA2, BRIP1, CDH1, CDK4, CDKN2A, CHEK2, DICER1, EPCAM, GREM1, HOXB13, MEN1, MLH1, MSH2, MSH3, MSH6, MUTYH, NBN, NF1, NF2, NTHL1, PALB2, PMS2, POLD1, POLE, PTEN, RAD51C, RAD51D, RECQL, RET, SDHA, SDHAF2, SDHB, SDHC, SDHD, SMAD4, SMARCA4, STK11, TP53, TSC1, TSC2, and VHL.  RNA data is routinely analyzed for use in variant interpretation for all genes.   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 DCIS 0.25 mg from true/new margin.   11/10/2021 - 12/02/2021 Chemotherapy   Patient is on Treatment Plan : BREAST Docetaxel + Carboplatin + Trastuzumab ([facility]) q21d / Trastuzumab q21d     11/10/2021 - 12/03/2021 Chemotherapy   Patient is on Treatment Plan : BREAST  Docetaxel + Carboplatin + Trastuzumab + Pertuzumab  ([facility]) q21d      11/10/2021 -  Chemotherapy   Patient is on Treatment Plan : BREAST Docetaxel + Carboplatin + Trastuzumab ([facility]) q21d / Trastuzumab q21d      INTERVAL HISTORY:  Rhonda Richardson returns for a follow up visit after receiving Cycle 6 of carboplatin/docetaxel and trastuzumab. She is now on adjuvant Herceptin. She is doing well. She denies any complaints. She will be done with radiation on 1/17. Rest of the pertinent 10 point ROS reviewed and negative  MEDICAL HISTORY:  Past Medical History:  Diagnosis Date   Breast cancer ([facility])    Hypertension     SURGICAL HISTORY: Past  Surgical History:  Procedure Laterality Date   BREAST LUMPECTOMY WITH RADIOACTIVE SEED AND SENTINEL LYMPH NODE BIOPSY Right 09/15/2021   Procedure: RIGHT BREAST SEED BRACKETED LUMPECTOMY AND SENTINEL NODE  BIOPSY;  Surgeon: [name], MD;  Location: [facility];  Service: General;  Laterality: Right;   cataract surgery Bilateral    DILATION AND CURETTAGE OF UTERUS  1970   EXCISION OF BREAST LESION Right 10/15/2021   Procedure: ASPIRATION RIGHT AXILLARY SEROMA;  Surgeon: [name], MD;  Location: [facility];  Service: General;  Laterality: Right;   eyelid tendon repair Bilateral    PORTACATH PLACEMENT N/A 09/15/2021   Procedure: PORT PLACEMENT;  Surgeon: [name], MD;  Location: [facility];  Service: General;  Laterality: N/A;   RE-EXCISION OF BREAST LUMPECTOMY Right 10/15/2021   Procedure: RE-EXCISION LUMPECTOMY RIGHT BREAST;  Surgeon: [name], MD;  Location: [facility];  Service: General;  Laterality: Right;    SOCIAL HISTORY: Social History   Socioeconomic History   Marital status: Married    Spouse name: Not on file   Number of children: Not on file   Years of education: Not on file   Highest education level: Not on file  Occupational History   Not on file  Tobacco Use   Smoking status: Never   Smokeless tobacco: Not on file  Vaping Use   Vaping Use: Never used  Substance and Sexual Activity   Alcohol use: Yes    Comment: 3- 4 drinks   Drug use: Never   Sexual activity: Not Currently  Other Topics Concern   Not on file  Social History Narrative   Not on file   Social Determinants of Health   Financial Resource Strain: Low Risk  (09/09/2021)   Overall Financial Resource Strain (CARDIA)    Difficulty of Paying Living Expenses: Not hard at all  Food Insecurity: No Food Insecurity (09/09/2021)   Hunger Vital Sign    Worried About Running Out of Food in the Last Year: Never true    Ran Out of Food in the Last Year: Never true  Transportation Needs: No Transportation Needs (09/09/2021)   PRAPARE - Hydrologist (Medical): No    Lack of Transportation (Non-Medical): No  Physical Activity: Not on file  Stress: Not on  file  Social Connections: Not on file  Intimate Partner Violence: Not on file    FAMILY HISTORY: Family History  Problem Relation Age of Onset   Heart disease Father 17   Thyroid cancer Other 36       niece's daughter    ALLERGIES:  is allergic to macrodantin [nitrofurantoin].  MEDICATIONS:  Current Outpatient Medications  Medication Sig Dispense Refill   acetaminophen (TYLENOL) 325 MG tablet Take 325 mg by mouth every 6 (six) hours as needed for moderate pain.     benazepril (LOTENSIN) 20 MG tablet Take 20 mg by mouth daily.     cholecalciferol (VITAMIN D3) 25 MCG (1000 UNIT) tablet Take 1,000 Units by mouth daily.     famotidine (PEPCID) 20 MG tablet Take 20 mg by mouth daily as needed for heartburn or indigestion.     hydrochlorothiazide (MICROZIDE) 12.5 MG capsule Take 12.5 mg by mouth daily.     ibuprofen (ADVIL) 200 MG tablet Take 200 mg by mouth every 6 (six) hours as needed for moderate pain.     Magnesium 250 MG TABS Take 250 mg by mouth daily.     traMADol Veatrice Bourbon)  50 MG tablet Take by mouth every 6 (six) hours as needed.     Vitamins-Lipotropics (MULTI-VITAMIN HP/MINERALS PO) Take 1 tablet by mouth daily.     No current facility-administered medications for this visit.    PHYSICAL EXAMINATION: ECOG PERFORMANCE STATUS: 0 - Asymptomatic  There were no vitals filed for this visit.   There were no vitals filed for this visit.  Physical Exam Constitutional:      Appearance: Normal appearance.  Cardiovascular:     Rate and Rhythm: Normal rate and regular rhythm.  Pulmonary:     Effort: Pulmonary effort is normal.     Breath sounds: Normal breath sounds.  Chest:     Comments: Bilateral breasts inspected. No palpable masses or regional adenopathy. Musculoskeletal:     Cervical back: Normal range of motion and neck supple. No rigidity.  Lymphadenopathy:     Cervical: No cervical adenopathy.  Skin:    General: Skin is warm and dry.  Neurological:     General:  No focal deficit present.     Mental Status: She is alert.  Psychiatric:        Mood and Affect: Mood normal.     LABORATORY DATA:  I have reviewed the data as listed Lab Results  Component Value Date   WBC 6.5 03/15/2022   HGB 9.2 (L) 03/15/2022   HCT 28.0 (L) 03/15/2022   MCV 89.2 03/15/2022   PLT 254 03/15/2022   Lab Results  Component Value Date   NA 138 03/15/2022   K 3.9 03/15/2022   CL 106 03/15/2022   CO2 27 03/15/2022    RADIOGRAPHIC STUDIES: I have personally reviewed the radiological reports and agreed with the findings in the report.  ASSESSMENT AND PLAN:  EMBREE BRAWLEY is a 82 y.o. female who presents for a follow up for right breast cancer.   #Malignant neoplasm of upper-outer quadrant of right breast in female, estrogen receptor positive  --ER/PR positive, HER2 amplified.  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 Media margin showed focal high-grade ductal carcinoma in situ, solid type with necrosis, negative for invasive carcinoma. Inferior and posterior margin negative for carcinoma.  --Given large tumor, triple positive, recommend dose modified TCH for 6 cycles followed by adjuvant herceptin vs taxol with herceptin. -- Most recent ECHO with EF of 55-60%, will continue to monitor. She is clinically asymptomatic. --We have discussed about anti estrogen therapy for 5 yrs once she completes radiation.  We have discussed about both options for anti estrogen therapy. She initially was very concerned about DVT/PE and endometrial issues with Tamoxifen but given her osteoporosis, she wonders if Tamoxifen is a better idea.  If she is able to start treatment for osteoporosis and she is still worried about tamoxifen related DVT complications or endometrial complications, I can always switch her to aromatase  inhibitors.  But I reassured her that tamoxifen is better for the bone density.  I have sent her the prescription to the pharmacy of her choice.  # Neuropathy, Mild. No medication needed.  # BLE edema,  resolved.  RTC in 6 weeks.    I have spent a total of 40 minutes minutes of face-to-face and non-face-to-face time, preparing to see the patient, performing a medically appropriate examination, counseling and educating the patient, documenting clinical information in the electronic health record, and care coordination.

## 2022-04-26 NOTE — Patient Instructions (Signed)
[facility]  Discharge Instructions: Thank you for choosing [facility] to provide your oncology and hematology care.   If you have a lab appointment with the [facility], please go directly to the [facility] and check in at the registration area.   Wear comfortable clothing and clothing appropriate for easy access to any Portacath or PICC line.   We strive to give you quality time with your provider. You may need to reschedule your appointment if you arrive late (15 or more minutes).  Arriving late affects you and other patients whose appointments are after yours.  Also, if you miss three or more appointments without notifying the office, you may be dismissed from the clinic at the provider's discretion.      For prescription refill requests, have your pharmacy contact our office and allow 72 hours for refills to be completed.    Today you received the following chemotherapy and/or immunotherapy agents: Herceptin Hylecta      To help prevent nausea and vomiting after your treatment, we encourage you to take your nausea medication as directed.  BELOW ARE SYMPTOMS THAT SHOULD BE REPORTED IMMEDIATELY: *FEVER GREATER THAN 100.4 F (38 C) OR HIGHER *CHILLS OR SWEATING *NAUSEA AND VOMITING THAT IS NOT CONTROLLED WITH YOUR NAUSEA MEDICATION *UNUSUAL SHORTNESS OF BREATH *UNUSUAL BRUISING OR BLEEDING *URINARY PROBLEMS (pain or burning when urinating, or frequent urination) *BOWEL PROBLEMS (unusual diarrhea, constipation, pain near the anus) TENDERNESS IN MOUTH AND THROAT WITH OR WITHOUT PRESENCE OF ULCERS (sore throat, sores in mouth, or a toothache) UNUSUAL RASH, SWELLING OR PAIN  UNUSUAL VAGINAL DISCHARGE OR ITCHING   Items with * indicate a potential emergency and should be followed up as soon as possible or go to the Emergency Department if any problems should occur.  Please show the CHEMOTHERAPY ALERT CARD or IMMUNOTHERAPY ALERT CARD at  check-in to the Emergency Department and triage nurse.  Should you have questions after your visit or need to cancel or reschedule your appointment, please contact [facility]  Dept: [phone]  and follow the prompts.  Office hours are 8:00 a.m. to 4:30 p.m. Monday - Friday. Please note that voicemails left after 4:00 p.m. may not be returned until the following business day.  We are closed weekends and major holidays. You have access to a nurse at all times for urgent questions. Please call the main number to the clinic Dept: [phone] and follow the prompts.   For any non-urgent questions, you may also contact your provider using MyChart. We now offer e-Visits for anyone 74 and older to request care online for non-urgent symptoms. For details visit [email or web address].   Also download the MyChart app! Go to the app store, search "MyChart", open the app, select [facility], and log in with your MyChart username and password.

## 2022-04-27 ENCOUNTER — Ambulatory Visit: Payer: Medicare Other

## 2022-04-27 ENCOUNTER — Ambulatory Visit
Admission: RE | Admit: 2022-04-27 | Discharge: 2022-04-27 | Disposition: A | Discharge status: Home / Self Care | Payer: Medicare Other | Source: Ambulatory Visit | Attending: Radiation Oncology | Admitting: Radiation Oncology

## 2022-04-27 ENCOUNTER — Other Ambulatory Visit: Payer: Self-pay

## 2022-04-27 DIAGNOSIS — R252 Cramp and spasm: Secondary | ICD-10-CM | POA: Diagnosis not present

## 2022-04-27 DIAGNOSIS — Z510 Encounter for antineoplastic radiation therapy: Secondary | ICD-10-CM | POA: Diagnosis not present

## 2022-04-27 DIAGNOSIS — M6281 Muscle weakness (generalized): Secondary | ICD-10-CM | POA: Diagnosis not present

## 2022-04-27 DIAGNOSIS — Z9181 History of falling: Secondary | ICD-10-CM

## 2022-04-27 DIAGNOSIS — R262 Difficulty in walking, not elsewhere classified: Secondary | ICD-10-CM | POA: Diagnosis not present

## 2022-04-27 DIAGNOSIS — C50411 Malignant neoplasm of upper-outer quadrant of right female breast: Secondary | ICD-10-CM | POA: Diagnosis not present

## 2022-04-27 LAB — RAD ONC ARIA SESSION SUMMARY
Course Elapsed Days: 28
Plan Fractions Treated to Date: 3
Plan Prescribed Dose Per Fraction: 2 Gy
Plan Total Fractions Prescribed: 4
Plan Total Prescribed Dose: 8 Gy
Reference Point Dosage Given to Date: 6 Gy
Reference Point Session Dosage Given: 2 Gy
Session Number: 19

## 2022-04-27 NOTE — Therapy (Signed)
OUTPATIENT PHYSICAL THERAPY RE-ASSESSMENT NOTE   Patient Name: Rhonda Richardson MRN: [id number] DOB:1940/09/01, 82 y.o., female Today's Date: 04/27/2022  PCP: Collene Leyden, MD REFERRING PROVIDER: Collene Leyden, MD   PT End of Session - 04/27/22 7106     Visit Number 17    Date for PT Re-Evaluation 05/26/22    Authorization Type Medicare    Authorization - Number of Visits 10    Progress Note Due on Visit 20    PT Start Time 0805    PT Stop Time 0840    PT Time Calculation (min) 35 min    Activity Tolerance Patient tolerated treatment well    Behavior During Therapy WFL for tasks assessed/performed             Past Medical History:  Diagnosis Date   Breast cancer ([facility])    Hypertension    Past Surgical History:  Procedure Laterality Date   BREAST LUMPECTOMY WITH RADIOACTIVE SEED AND SENTINEL LYMPH NODE BIOPSY Right 09/15/2021   Procedure: RIGHT BREAST SEED BRACKETED LUMPECTOMY AND SENTINEL NODE BIOPSY;  Surgeon: [name], MD;  Location: [facility];  Service: General;  Laterality: Right;   cataract surgery Bilateral    DILATION AND CURETTAGE OF UTERUS  1970   EXCISION OF BREAST LESION Right 10/15/2021   Procedure: ASPIRATION RIGHT AXILLARY SEROMA;  Surgeon: [name], MD;  Location: [facility];  Service: General;  Laterality: Right;   eyelid tendon repair Bilateral    PORTACATH PLACEMENT N/A 09/15/2021   Procedure: PORT PLACEMENT;  Surgeon: [name], MD;  Location: [facility];  Service: General;  Laterality: N/A;   RE-EXCISION OF BREAST LUMPECTOMY Right 10/15/2021   Procedure: RE-EXCISION LUMPECTOMY RIGHT BREAST;  Surgeon: [name], MD;  Location: [facility];  Service: General;  Laterality: Right;   Patient Active Problem List   Diagnosis Date Noted   Port-A-Cath in place 11/18/2021   Genetic testing 09/28/2021   Malignant neoplasm of upper-outer quadrant of right breast in female, estrogen receptor positive ([facility]) 09/08/2021     REFERRING DIAG: right breast cancer at risk for lymphedema  THERAPY DIAG:  Muscle weakness (generalized)  History of falling  Difficulty in walking, not elsewhere classified  Cramp and spasm  PERTINENT HISTORY: right UE Lymphedema risk, None   PRECAUTIONS: right UE Lymphedema risk, None  SUBJECTIVE: PT was stuck in traffic and first half of session was completed by Ruben Im, PT.  Pt states she has radiation and work today.  "I started to cancel but I felt like I would be fine to come here first".  Patient states she needs to leave a little early to get to her radiation appt.   PAIN:  Are you having pain? No  SOZO SCREENING: Patient was assessed today using the SOZO machine to determine the lymphedema index score. This was compared to her baseline score. It was determined that she is within the recommended range when compared to her baseline and no further action is needed at this time. She will continue SOZO screenings. These are done every 3 months for 2 years post operatively followed by every 6 months for 2 years, and then annually.   Ortho Re-assessment visit:   PRECAUTIONS: Other: Breast cancer   WEIGHT BEARING RESTRICTIONS No   FALLS:  Has patient fallen in last 6 months? Yes on 11/13/2021 she fell after the chemotherapy treatment due to sciatica.  LIVING ENVIRONMENT: Lives with: lives with their spouse   OCCUPATION: part time  PLOF: Independent   PATIENT GOALS pelvic floor education   PERTINENT HISTORY:  Patient was diagnosed on 08/12/2021 with right grade 2 invasive ductal carcinoma breast cancer. She had a right lumpectomy and sentinel node biopsy (0/4 nodes positive) on 09/15/2021 followed by a re-excision on 10/15/2021. It is triple positive with a Ki67 of 30%. Presently having chemotherapy.    BOWEL MOVEMENT Pain with bowel movement: No   URINATION Pain with urination: No Fully empty bladder: No   INTERCOURSE Pain with intercourse:  none and uses  lubricant   PREGNANCY Vaginal deliveries 3 Tearing No C-section deliveries 0 Currently pregnant No   OBJECTIVE:    DIAGNOSTIC FINDINGS:  none     COGNITION:            Overall cognitive status: Within functional limits for tasks assessed                          SENSATION:            Light touch: Appears intact on left, decreased on lateral and medial right lower leg            Proprioception: Appears intact                  POSTURE: No Significant postural limitations     Sit to stand 5 times 15 seconds.  TUG 11 sec Stand on right leg 3 sec Stand on left leg 15 sec with no issues Tandem stance with left leg in front 30 sec Tandem stance with right leg in front  30 sec with increased body sway     Recheck 10/26:  5x sit to stand:  12 sec                             TUG:  7 sec  04/21/22: 5 x sit to stand:  8 sec               TUG: 6.07 sec                 PELVIC ALIGNMENT:   LUMBARAROM/PROM   A/PROM A/PROM  eval 9/21  Extension Decreased by 25% WFLs    LOWER EXTREMITY XBD:ZHGDJMEQA hip ROM is full   LOWER EXTREMITY MMT:   MMT Right eval Left eval 9/21 10/31 04/21/22  Hip flexion 4/5 5/5     4-  Hip extension 4/5 4/5 4 right/left  4+/5 left/right   Hip abduction 3+/5 3+/5 4- right/left  4+/5 left/right 4+  Hip external rotation 4/5 4/5     4  Knee flexion 5/5 5/5     5  Knee extension 4/5 5/5     5  Ankle dorsiflexion 4/5 5/5 4+ right  4+/5 right 4+  Ankle plantarflexion 2/5 3/5     4  Ankle inversion 5/5 5/5     5  Ankle eversion 5/5 5/5     5     PALPATION:   General  no tenderness                       TODAY'S TREATMENT  04/27/22: Nu-step x 5 min level Sit to stand x 10 with 5lb kb Step taps on bottom shelf of cabinet x 2 min Lateral band walks x 5 laps at back counter Seated LAQ x 20 with 5 lb Seated March x 20 with 5  lb Seated hip ER x 20 with 5 lb Seated up and over hurdle with 5 lb 2 x 10 Marching on balance pad x 20 Squats on  balance pad x 20 Lunge to BOSU x 10 each LE fwd then lateral Rocker board x 2 min Cone touches in SLS 3 x 10 each LE (cone on mat table)  04/21/22: Re-evaluation visit Nu-step x 5 min level Sit to stand x 5 Marching on balance pad x 20 Lateral band walks x 5 laps at back counter Squats on balance pad x 20 Instructed in various piriformis stretches for her right hip discomfort  11/16: Recumbent bike x 5 min   Sit to stand from mat table 1 x 10 Squat to table 1 x 10 Lateral band walks with blue loop 5 laps along barre Foot on balance pad cross chop with red physio ball to opposite knee x 10 each side Step up on balance pad with red physio ball overhead to knee flexion x 10 each side Step ups 8" 2 x 10 each LE March on balance pad x 20 Squats on balance pad 2 x 10 Cone touches standing on floor (cone on 8" step for increased difficulty) 3 x 10 each LE Deeper squat to 2 balance pads on 8" step UE use to push up but also attempted without UE use.   Trunk rotation at Matrix with 5 lb  2 x 10 each side     PATIENT EDUCATION:  Education details: education on vaginal moisturizers and lubricants. Handed patient samples of each.  Person educated: Patient Education method: Theatre stage manager Education comprehension: verbalized understanding     HOME EXERCISE PROGRAM: Access Code: [email or web address] URL: [email or web address] Date: 02/04/2022 Prepared by: Ruben Im   Exercises - Seated Hip External Rotation Stretch  - 1 x daily - 7 x weekly - 1 sets - 3 reps - 30 hold - Seated Slump Nerve Glide  - 1 x daily - 7 x weekly - 1 sets - 5 reps - Seated March  - 1 x daily - 7 x weekly - 1 sets - 10 reps - Sit to Stand  - 1 x daily - 7 x weekly - 1 sets - 10 reps - Seated Hip Abduction with Resistance  - 1 x daily - 7 x weekly - 1 sets - 10 reps - Forward Step Up with Counter Support  - 1 x daily - 7 x weekly - 1 sets - 10 reps - Seated Hip Flexion March with Ankle Weights   - 1 x daily - 7 x weekly - 10 reps - Seated Knee Extension with Resistance  - 1 x daily - 7 x weekly - 1 sets - 10 reps Given red band for home   ASSESSMENT:   CLINICAL IMPRESSION: Jami seems to have maintained her strength and endurance fairly well while on hold for PT.  She is well motivated and compliant.  She should continue to do well. She completed all tasks today with minimal fatigue.  She completed 3 sets of 10 SLS dynamic balance activities without LOB.   She would benefit from continuing skilled PT for proximal strength and balance training.       OBJECTIVE IMPAIRMENTS Abnormal gait, decreased activity tolerance, decreased balance, decreased coordination, decreased endurance, difficulty walking, and decreased strength.    ACTIVITY LIMITATIONS lifting, bending, squatting, stairs, transfers, and locomotion level   PARTICIPATION LIMITATIONS: cleaning, laundry, shopping, community activity, and occupation   PERSONAL FACTORS Age, Fitness, Past/current  experiences, and 1-2 comorbidities: breast cancer and having chemotherapy  are also affecting patient's functional outcome.    REHAB POTENTIAL: Excellent   CLINICAL DECISION MAKING: Evolving/moderate complexity   EVALUATION COMPLEXITY: Moderate     GOALS: Goals reviewed with patient? Yes   SHORT TERM GOALS: Target date: 01/13/2022   Patient is independent with initial HEP for balance and strength.  Baseline: Goal status: goal met 10/26   2.  Patient reports her endurance and steadiness on her right leg has improved >/= 25% the 2 weeks prior to her chemotherapy treatments.  Baseline:  Goal status: met 10/26   3.  Patient educated on vaginal moisturizers and lubricants to promote good vaginal health.  Baseline:  Goal status: MET 12/16/2021   LONG TERM GOALS: Target date: 03/10/2022    Patient independent with advanced HEP for balance and strength to reduce the chance of falls.  Baseline:  Goal status: INITIAL   2.  Sit  to stand 5x is </= 12.6 seconds to reduce the chance of her falling.  Baseline:  Goal status: goal met 10/26   3.  Patient able to stand on right leg for 15 seconds so she is able to go up and down steps with step over step pattern.  Baseline: 04/21/22 8 sec left, 15 sec right Goal status: INITIAL   4.  Tandem stance with right leg for 30 seconds with </= 50% less body swaying due to improved strength and balance.  Baseline:  Goal status:MET   5.  Patient is able to squat and stand up with >/= 50% greater ease due to increased in right lower extremity strength.  Baseline:  Goal status: INITIAL     PLAN: PT FREQUENCY: 2x/week   PT DURATION: 12 weeks   PLANNED INTERVENTIONS: Therapeutic exercises, Therapeutic activity, Neuromuscular re-education, Balance training, Gait training, Patient/Family education, Self Care, and Stair training   PLAN FOR NEXT SESSION:   continue 1-2  times per week x 5 weeks.  Patient will be out of town one of these weeks.  Therefore treatment weeks will be a total of 4 weeks.  Focus on leg strength, functional training and balance training;     Anderson Malta B. Pamelyn Bancroft, PT 04/27/22 8:47 AM  [facility] [address] [place], [place] [address] Phone # [phone] Fax [phone]

## 2022-04-28 ENCOUNTER — Other Ambulatory Visit: Payer: Self-pay

## 2022-04-28 ENCOUNTER — Ambulatory Visit
Admission: RE | Admit: 2022-04-28 | Discharge: 2022-04-28 | Disposition: A | Discharge status: Home / Self Care | Payer: Medicare Other | Source: Ambulatory Visit | Attending: Radiation Oncology | Admitting: Radiation Oncology

## 2022-04-28 ENCOUNTER — Other Ambulatory Visit: Payer: Self-pay | Admitting: Hematology and Oncology

## 2022-04-28 ENCOUNTER — Encounter: Payer: Self-pay | Admitting: Radiation Oncology

## 2022-04-28 DIAGNOSIS — C50411 Malignant neoplasm of upper-outer quadrant of right female breast: Secondary | ICD-10-CM

## 2022-04-28 DIAGNOSIS — Z170 Estrogen receptor positive status [ER+]: Secondary | ICD-10-CM | POA: Diagnosis not present

## 2022-04-28 DIAGNOSIS — Z510 Encounter for antineoplastic radiation therapy: Secondary | ICD-10-CM | POA: Diagnosis not present

## 2022-04-28 LAB — RAD ONC ARIA SESSION SUMMARY
Course Elapsed Days: 29
Plan Fractions Treated to Date: 4
Plan Prescribed Dose Per Fraction: 2 Gy
Plan Total Fractions Prescribed: 4
Plan Total Prescribed Dose: 8 Gy
Reference Point Dosage Given to Date: 8 Gy
Reference Point Session Dosage Given: 2 Gy
Session Number: 20

## 2022-04-28 NOTE — Progress Notes (Unsigned)
[facility] PROGRESS NOTE  Patient Care Team: Collene Leyden, MD as PCP - General (Family Medicine) [name], MD as Consulting Physician (General Surgery) Benay Pike, MD as Consulting Physician (Hematology and Oncology) Kyung Rudd, MD as Consulting Physician (Radiation Oncology) Mauro Kaufmann, RN as Oncology Nurse Navigator Rockwell Germany, RN as Oncology Nurse Navigator  CHIEF COMPLAINTS/PURPOSE OF CONSULTATION:  Breast cancer  SUMMARY OF ONCOLOGIC HISTORY: Oncology History  Malignant neoplasm of upper-outer quadrant of right breast in female, estrogen receptor positive ([facility])  08/12/2021 Mammogram   Mammogram showed possible mass in the right breast.  Diagnostic mammogram showed 2 highly suspicious upper outer right breast masses measuring 1.5 cm in the 9:30 position and 1.4 cm at the 10 o'clock position.  These masses or distortions mammographically spanning a distance of up to 3.5 cm.  No abnormal appearing right axillary lymph nodes.   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  Prognostic showed ER 99% positive strong staining PR 10% positive moderate staining, HER2 positive 3+ and Ki-67 of 30%.   09/08/2021 Initial Diagnosis   Malignant neoplasm of upper-outer quadrant of right breast in female, estrogen receptor positive ([facility])   09/15/2021 Pathology Results   She had right breast lumpectomy which showed invasive ductal carcinoma, 3.6 cm in maximal extent involving inferior and medial margins, DCIS approaching to less than 0.1 cm of closest margin no metastatic carcinoma identified in 4 out of 4 lymph nodes.  She is scheduled for repeat surgery given positive margins.  Prior prognostic showed ER 99% positive strong staining intensity PR 10% positive moderate staining intensity, HER2 positive.   09/23/2021 Genetic Testing   Negative hereditary cancer genetic testing: no pathogenic variants  detected in [facility] CustomNext-cancer +RNAinsight Panel.  Report date is September 23, 2021.   The CustomNext-Cancer+RNAinsight panel offered by [facility] includes sequencing and rearrangement analysis for the following 47 genes:  APC, ATM, AXIN2, BARD1, BMPR1A, BRCA1, BRCA2, BRIP1, CDH1, CDK4, CDKN2A, CHEK2, DICER1, EPCAM, GREM1, HOXB13, MEN1, MLH1, MSH2, MSH3, MSH6, MUTYH, NBN, NF1, NF2, NTHL1, PALB2, PMS2, POLD1, POLE, PTEN, RAD51C, RAD51D, RECQL, RET, SDHA, SDHAF2, SDHB, SDHC, SDHD, SMAD4, SMARCA4, STK11, TP53, TSC1, TSC2, and VHL.  RNA data is routinely analyzed for use in variant interpretation for all genes.   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 DCIS 0.25 mg from true/new margin.   11/10/2021 - 12/02/2021 Chemotherapy   Patient is on Treatment Plan : BREAST Docetaxel + Carboplatin + Trastuzumab ([facility]) q21d / Trastuzumab q21d     11/10/2021 - 12/03/2021 Chemotherapy   Patient is on Treatment Plan : BREAST  Docetaxel + Carboplatin + Trastuzumab + Pertuzumab  ([facility]P) q21d      11/10/2021 -  Chemotherapy   Patient is on Treatment Plan : BREAST Docetaxel + Carboplatin + Trastuzumab ([facility]) q21d / Trastuzumab q21d      INTERVAL HISTORY:  Rhonda Richardson returns for a follow up visit after receiving Cycle 6 of carboplatin/docetaxel and trastuzumab. She is now on adjuvant Herceptin. She is doing well. She denies any complaints. She will be done with radiation on 1/17. Rest of the pertinent 10 point ROS reviewed and negative  MEDICAL HISTORY:  Past Medical History:  Diagnosis Date   Breast cancer ([facility])    Hypertension     SURGICAL HISTORY: Past  Surgical History:  Procedure Laterality Date   BREAST LUMPECTOMY WITH RADIOACTIVE SEED AND SENTINEL LYMPH NODE BIOPSY Right 09/15/2021   Procedure: RIGHT BREAST SEED BRACKETED LUMPECTOMY AND SENTINEL NODE  BIOPSY;  Surgeon: [name], MD;  Location: [facility];  Service: General;  Laterality: Right;   cataract surgery Bilateral    DILATION AND CURETTAGE OF UTERUS  1970   EXCISION OF BREAST LESION Right 10/15/2021   Procedure: ASPIRATION RIGHT AXILLARY SEROMA;  Surgeon: [name], MD;  Location: [facility];  Service: General;  Laterality: Right;   eyelid tendon repair Bilateral    PORTACATH PLACEMENT N/A 09/15/2021   Procedure: PORT PLACEMENT;  Surgeon: [name], MD;  Location: [facility];  Service: General;  Laterality: N/A;   RE-EXCISION OF BREAST LUMPECTOMY Right 10/15/2021   Procedure: RE-EXCISION LUMPECTOMY RIGHT BREAST;  Surgeon: [name], MD;  Location: [facility];  Service: General;  Laterality: Right;    SOCIAL HISTORY: Social History   Socioeconomic History   Marital status: Married    Spouse name: Not on file   Number of children: Not on file   Years of education: Not on file   Highest education level: Not on file  Occupational History   Not on file  Tobacco Use   Smoking status: Never   Smokeless tobacco: Not on file  Vaping Use   Vaping Use: Never used  Substance and Sexual Activity   Alcohol use: Yes    Comment: 3- 4 drinks   Drug use: Never   Sexual activity: Not Currently  Other Topics Concern   Not on file  Social History Narrative   Not on file   Social Determinants of Health   Financial Resource Strain: Low Risk  (09/09/2021)   Overall Financial Resource Strain (CARDIA)    Difficulty of Paying Living Expenses: Not hard at all  Food Insecurity: No Food Insecurity (09/09/2021)   Hunger Vital Sign    Worried About Running Out of Food in the Last Year: Never true    Ran Out of Food in the Last Year: Never true  Transportation Needs: No Transportation Needs (09/09/2021)   PRAPARE - Hydrologist (Medical): No    Lack of Transportation (Non-Medical): No  Physical Activity: Not on file  Stress: Not on  file  Social Connections: Not on file  Intimate Partner Violence: Not on file    FAMILY HISTORY: Family History  Problem Relation Age of Onset   Heart disease Father 19   Thyroid cancer Other 33       niece's daughter    ALLERGIES:  is allergic to macrodantin [nitrofurantoin].  MEDICATIONS:  Current Outpatient Medications  Medication Sig Dispense Refill   acetaminophen (TYLENOL) 325 MG tablet Take 325 mg by mouth every 6 (six) hours as needed for moderate pain.     benazepril (LOTENSIN) 20 MG tablet Take 20 mg by mouth daily.     cholecalciferol (VITAMIN D3) 25 MCG (1000 UNIT) tablet Take 1,000 Units by mouth daily.     famotidine (PEPCID) 20 MG tablet Take 20 mg by mouth daily as needed for heartburn or indigestion.     hydrochlorothiazide (MICROZIDE) 12.5 MG capsule Take 12.5 mg by mouth daily.     ibuprofen (ADVIL) 200 MG tablet Take 200 mg by mouth every 6 (six) hours as needed for moderate pain.     Magnesium 250 MG TABS Take 250 mg by mouth daily.     tamoxifen (NOLVADEX)  20 MG tablet Take 1 tablet (20 mg total) by mouth daily. 90 tablet 3   traMADol (ULTRAM) 50 MG tablet Take by mouth every 6 (six) hours as needed.     Vitamins-Lipotropics (MULTI-VITAMIN HP/MINERALS PO) Take 1 tablet by mouth daily.     No current facility-administered medications for this visit.    PHYSICAL EXAMINATION: ECOG PERFORMANCE STATUS: 0 - Asymptomatic  There were no vitals filed for this visit.   There were no vitals filed for this visit.  Physical Exam Constitutional:      Appearance: Normal appearance.  Cardiovascular:     Rate and Rhythm: Normal rate and regular rhythm.  Pulmonary:     Effort: Pulmonary effort is normal.     Breath sounds: Normal breath sounds.  Chest:     Comments: Bilateral breasts inspected. No palpable masses or regional adenopathy. Musculoskeletal:     Cervical back: Normal range of motion and neck supple. No rigidity.  Lymphadenopathy:     Cervical: No  cervical adenopathy.  Skin:    General: Skin is warm and dry.  Neurological:     General: No focal deficit present.     Mental Status: She is alert.  Psychiatric:        Mood and Affect: Mood normal.     LABORATORY DATA:  I have reviewed the data as listed Lab Results  Component Value Date   WBC 4.1 04/26/2022   HGB 10.5 (L) 04/26/2022   HCT 31.9 (L) 04/26/2022   MCV 84.8 04/26/2022   PLT 209 04/26/2022   Lab Results  Component Value Date   NA 140 04/26/2022   K 3.8 04/26/2022   CL 105 04/26/2022   CO2 29 04/26/2022    RADIOGRAPHIC STUDIES: I have personally reviewed the radiological reports and agreed with the findings in the report.  ASSESSMENT AND PLAN:   Rhonda Richardson is a 82 y.o. female who presents for a follow up for right breast cancer.   #Malignant neoplasm of upper-outer quadrant of right breast in female, estrogen receptor positive  --ER/PR positive, HER2 amplified.  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 [facility] margin showed focal high-grade ductal carcinoma in situ, solid type with necrosis, negative for invasive carcinoma. Inferior and posterior margin negative for carcinoma.  --Given large tumor, triple positive, recommend dose modified TCH for 6 cycles followed by adjuvant herceptin vs taxol with herceptin. -- Most recent ECHO with EF of 55-60%, will continue to monitor. She is clinically asymptomatic. --We have discussed about anti estrogen therapy for 5 yrs once she completes radiation.  We have discussed about both options for anti estrogen therapy. She initially was very concerned about DVT/PE and endometrial issues with Tamoxifen but given her osteoporosis, she wonders if Tamoxifen is a better idea.  If she is able to start treatment for osteoporosis and she is still worried about tamoxifen  related DVT complications or endometrial complications, I can always switch her to aromatase inhibitors.  But I reassured her that tamoxifen is better for the bone density.  I have sent her the prescription to the pharmacy of her choice.  # Neuropathy, Mild. No medication needed.  # BLE edema,  resolved.  RTC in 6 weeks.    I have spent a total of 40 minutes minutes of face-to-face and non-face-to-face time, preparing to see the patient, performing a medically appropriate examination, counseling  and educating the patient, documenting clinical information in the electronic health record, and care coordination.

## 2022-04-29 ENCOUNTER — Ambulatory Visit: Payer: Medicare Other

## 2022-04-29 ENCOUNTER — Encounter: Payer: Self-pay | Admitting: *Deleted

## 2022-04-29 ENCOUNTER — Other Ambulatory Visit: Payer: Self-pay | Admitting: Hematology and Oncology

## 2022-04-29 DIAGNOSIS — Z170 Estrogen receptor positive status [ER+]: Secondary | ICD-10-CM

## 2022-04-29 DIAGNOSIS — C50411 Malignant neoplasm of upper-outer quadrant of right female breast: Secondary | ICD-10-CM

## 2022-04-29 DIAGNOSIS — Z9181 History of falling: Secondary | ICD-10-CM

## 2022-04-29 DIAGNOSIS — M6281 Muscle weakness (generalized): Secondary | ICD-10-CM | POA: Diagnosis not present

## 2022-04-29 DIAGNOSIS — R252 Cramp and spasm: Secondary | ICD-10-CM | POA: Diagnosis not present

## 2022-04-29 DIAGNOSIS — R262 Difficulty in walking, not elsewhere classified: Secondary | ICD-10-CM | POA: Diagnosis not present

## 2022-04-29 NOTE — Therapy (Signed)
OUTPATIENT PHYSICAL THERAPY TREATMENT NOTE   Patient Name: Rhonda Richardson MRN: [id number] DOB:1941/02/10, 82 y.o., female Today's Date: 04/29/2022  PCP: Collene Leyden, MD REFERRING PROVIDER: Benay Pike, MD   PT End of Session - 04/29/22 1119     Visit Number 18    Date for PT Re-Evaluation 05/26/22    Authorization Type Medicare    Progress Note Due on Visit 20    PT Start Time 1100    PT Stop Time 1145    PT Time Calculation (min) 45 min    Activity Tolerance Patient tolerated treatment well    Behavior During Therapy WFL for tasks assessed/performed             Past Medical History:  Diagnosis Date   Breast cancer ([place])    Hypertension    Past Surgical History:  Procedure Laterality Date   BREAST LUMPECTOMY WITH RADIOACTIVE SEED AND SENTINEL LYMPH NODE BIOPSY Right 09/15/2021   Procedure: RIGHT BREAST SEED BRACKETED LUMPECTOMY AND SENTINEL NODE BIOPSY;  Surgeon: [name], MD;  Location: [facility];  Service: General;  Laterality: Right;   cataract surgery Bilateral    DILATION AND CURETTAGE OF UTERUS  1970   EXCISION OF BREAST LESION Right 10/15/2021   Procedure: ASPIRATION RIGHT AXILLARY SEROMA;  Surgeon: [name], MD;  Location: [place] [facility];  Service: General;  Laterality: Right;   eyelid tendon repair Bilateral    PORTACATH PLACEMENT N/A 09/15/2021   Procedure: PORT PLACEMENT;  Surgeon: [name], MD;  Location: [facility];  Service: General;  Laterality: N/A;   RE-EXCISION OF BREAST LUMPECTOMY Right 10/15/2021   Procedure: RE-EXCISION LUMPECTOMY RIGHT BREAST;  Surgeon: [name], MD;  Location: [place] [facility];  Service: General;  Laterality: Right;   Patient Active Problem List   Diagnosis Date Noted   Port-A-Cath in place 11/18/2021   Genetic testing 09/28/2021   Malignant neoplasm of upper-outer quadrant of right breast in female, estrogen receptor positive ([place]) 09/08/2021    REFERRING DIAG: right breast cancer at risk for  lymphedema  THERAPY DIAG:  Muscle weakness (generalized)  History of falling  Difficulty in walking, not elsewhere classified  PERTINENT HISTORY: right UE Lymphedema risk, None   PRECAUTIONS: right UE Lymphedema risk, None  SUBJECTIVE: PT was stuck in traffic and first half of session was completed by Ruben Im, PT.  Pt states she has radiation and work today.  "I started to cancel but I felt like I would be fine to come here first".  Patient states she needs to leave a little early to get to her radiation appt.   PAIN:  Are you having pain? No  SOZO SCREENING: Patient was assessed today using the SOZO machine to determine the lymphedema index score. This was compared to her baseline score. It was determined that she is within the recommended range when compared to her baseline and no further action is needed at this time. She will continue SOZO screenings. These are done every 3 months for 2 years post operatively followed by every 6 months for 2 years, and then annually.   Ortho Re-assessment visit:   PRECAUTIONS: Other: Breast cancer   WEIGHT BEARING RESTRICTIONS No   FALLS:  Has patient fallen in last 6 months? Yes on 11/13/2021 she fell after the chemotherapy treatment due to sciatica.  LIVING ENVIRONMENT: Lives with: lives with their spouse   OCCUPATION: part time   PLOF: Independent   PATIENT GOALS pelvic floor education   PERTINENT  HISTORY:  Patient was diagnosed on 08/12/2021 with right grade 2 invasive ductal carcinoma breast cancer. She had a right lumpectomy and sentinel node biopsy (0/4 nodes positive) on 09/15/2021 followed by a re-excision on 10/15/2021. It is triple positive with a Ki67 of 30%. Presently having chemotherapy.    BOWEL MOVEMENT Pain with bowel movement: No   URINATION Pain with urination: No Fully empty bladder: No   INTERCOURSE Pain with intercourse:  none and uses lubricant   PREGNANCY Vaginal deliveries 3 Tearing No C-section  deliveries 0 Currently pregnant No   OBJECTIVE:    DIAGNOSTIC FINDINGS:  none     COGNITION:            Overall cognitive status: Within functional limits for tasks assessed                          SENSATION:            Light touch: Appears intact on left, decreased on lateral and medial right lower leg            Proprioception: Appears intact                  POSTURE: No Significant postural limitations     Sit to stand 5 times 15 seconds.  TUG 11 sec Stand on right leg 3 sec Stand on left leg 15 sec with no issues Tandem stance with left leg in front 30 sec Tandem stance with right leg in front  30 sec with increased body sway     Recheck 10/26:  5x sit to stand:  12 sec                             TUG:  7 sec  04/21/22: 5 x sit to stand:  8 sec               TUG: 6.07 sec                 PELVIC ALIGNMENT:   LUMBARAROM/PROM   A/PROM A/PROM  eval 9/21  Extension Decreased by 25% WFLs    LOWER EXTREMITY ZOX:WRUEAVWUJ hip ROM is full   LOWER EXTREMITY MMT:   MMT Right eval Left eval 9/21 10/31 04/21/22  Hip flexion 4/5 5/5     4-  Hip extension 4/5 4/5 4 right/left  4+/5 left/right   Hip abduction 3+/5 3+/5 4- right/left  4+/5 left/right 4+  Hip external rotation 4/5 4/5     4  Knee flexion 5/5 5/5     5  Knee extension 4/5 5/5     5  Ankle dorsiflexion 4/5 5/5 4+ right  4+/5 right 4+  Ankle plantarflexion 2/5 3/5     4  Ankle inversion 5/5 5/5     5  Ankle eversion 5/5 5/5     5     PALPATION:   General  no tenderness                       TODAY'S TREATMENT  04/29/22: Recumbent bike x 5 min level 2 Sit to stand x 10  Squat to table x 10 Cone touches in SLS 3 x 10 each LE (cone on mat table) Step up and hold with opposite knee lift on 6" step x 20 each LE Step taps on 8" step x 20 Step up on 8"  step and squat, alternating LE's each rep x 10 Lateral band walks x 3 laps at barre (approx 10 ft) Rocker board x 2 min Leg Press x 20 with  70lbs Seated LAQ x 20 with 5 lb Seated March x 20 with 5 lb Seated hip ER x 20 with 5 lb  04/27/22: Nu-step x 5 min level Sit to stand x 10 with 5lb kb Step taps on bottom shelf of cabinet x 2 min Lateral band walks x 5 laps at back counter Seated LAQ x 20 with 5 lb Seated March x 20 with 5 lb Seated hip ER x 20 with 5 lb Seated up and over hurdle with 5 lb 2 x 10 Marching on balance pad x 20 Squats on balance pad x 20 Lunge to BOSU x 10 each LE fwd then lateral Rocker board x 2 min Cone touches in SLS 3 x 10 each LE (cone on mat table)  04/21/22: Re-evaluation visit Nu-step x 5 min level Sit to stand x 5 Marching on balance pad x 20 Lateral band walks x 5 laps at back counter Squats on balance pad x 20 Instructed in various piriformis stretches for her right hip discomfort     PATIENT EDUCATION:  Education details: education on vaginal moisturizers and lubricants. Handed patient samples of each.  Person educated: Patient Education method: Theatre stage manager Education comprehension: verbalized understanding     HOME EXERCISE PROGRAM: Access Code: [email or web address] URL: [email or web address] Date: 02/04/2022 Prepared by: Ruben Im   Exercises - Seated Hip External Rotation Stretch  - 1 x daily - 7 x weekly - 1 sets - 3 reps - 30 hold - Seated Slump Nerve Glide  - 1 x daily - 7 x weekly - 1 sets - 5 reps - Seated March  - 1 x daily - 7 x weekly - 1 sets - 10 reps - Sit to Stand  - 1 x daily - 7 x weekly - 1 sets - 10 reps - Seated Hip Abduction with Resistance  - 1 x daily - 7 x weekly - 1 sets - 10 reps - Forward Step Up with Counter Support  - 1 x daily - 7 x weekly - 1 sets - 10 reps - Seated Hip Flexion March with Ankle Weights  - 1 x daily - 7 x weekly - 10 reps - Seated Knee Extension with Resistance  - 1 x daily - 7 x weekly - 1 sets - 10 reps Given red band for home   ASSESSMENT:   CLINICAL IMPRESSION: Blimi is progressing appropriately.   She is tolerating higher level exercises and balance training with ease.  She is well motivated and compliant and having minimal side effects from radiation.    She would benefit from continuing skilled PT for proximal strength and balance training.       OBJECTIVE IMPAIRMENTS Abnormal gait, decreased activity tolerance, decreased balance, decreased coordination, decreased endurance, difficulty walking, and decreased strength.    ACTIVITY LIMITATIONS lifting, bending, squatting, stairs, transfers, and locomotion level   PARTICIPATION LIMITATIONS: cleaning, laundry, shopping, community activity, and occupation   PERSONAL FACTORS Age, Fitness, Past/current experiences, and 1-2 comorbidities: breast cancer and having chemotherapy  are also affecting patient's functional outcome.    REHAB POTENTIAL: Excellent   CLINICAL DECISION MAKING: Evolving/moderate complexity   EVALUATION COMPLEXITY: Moderate     GOALS: Goals reviewed with patient? Yes   SHORT TERM GOALS: Target date: 01/13/2022   Patient is independent with initial HEP for  balance and strength.  Baseline: Goal status: goal met 10/26   2.  Patient reports her endurance and steadiness on her right leg has improved >/= 25% the 2 weeks prior to her chemotherapy treatments.  Baseline:  Goal status: met 10/26   3.  Patient educated on vaginal moisturizers and lubricants to promote good vaginal health.  Baseline:  Goal status: MET 12/16/2021   LONG TERM GOALS: Target date: 03/10/2022    Patient independent with advanced HEP for balance and strength to reduce the chance of falls.  Baseline:  Goal status: INITIAL   2.  Sit to stand 5x is </= 12.6 seconds to reduce the chance of her falling.  Baseline:  Goal status: goal met 10/26   3.  Patient able to stand on right leg for 15 seconds so she is able to go up and down steps with step over step pattern.  Baseline: 04/21/22 8 sec left, 15 sec right Goal status: INITIAL   4.   Tandem stance with right leg for 30 seconds with </= 50% less body swaying due to improved strength and balance.  Baseline:  Goal status:MET   5.  Patient is able to squat and stand up with >/= 50% greater ease due to increased in right lower extremity strength.  Baseline:  Goal status: INITIAL     PLAN: PT FREQUENCY: 2x/week   PT DURATION: 12 weeks   PLANNED INTERVENTIONS: Therapeutic exercises, Therapeutic activity, Neuromuscular re-education, Balance training, Gait training, Patient/Family education, Self Care, and Stair training   PLAN FOR NEXT SESSION:   continue 1-2  times per week x 5 weeks.  Patient will be out of town one of these weeks.  Therefore treatment weeks will be a total of 4 weeks.  Focus on leg strength, functional training and balance training;     Anderson Malta B. Jannetta Massey, PT 04/29/22 12:30 PM  [facility] [address], [place], [place] [address] Phone # [phone] Fax [phone]

## 2022-04-29 NOTE — Progress Notes (Signed)
Treatment plan signed.

## 2022-05-03 ENCOUNTER — Encounter: Payer: Self-pay | Admitting: Physician Assistant

## 2022-05-03 ENCOUNTER — Encounter: Payer: Self-pay | Admitting: Hematology and Oncology

## 2022-05-03 NOTE — Progress Notes (Signed)
                                                                                                                                                             Patient Name: ANNALEA ALGUIRE MRN: [id number] DOB: 03/12/1941 Referring Physician: Collene Leyden Date of Service: 04/28/2022 [facility]-,                                                         End Of Treatment Note  Diagnoses: C50.411-Malignant neoplasm of upper-outer quadrant of right female breast  Cancer Staging: Stage IA, pT2N0M0 grade 1 triple positive invasive ductal carcinoma of the right breast   Intent: Curative  Radiation Treatment Dates: 03/30/2022 through 04/28/2022 Site Technique Total Dose (Gy) Dose per Fx (Gy) Completed Fx Beam Energies  Breast, Right: Breast_R 3D 42.56/42.56 2.66 16/16 6X, 10X  Breast, Right: Breast_R_Bst 3D 8/8 2 4/4 6X, 10X   Narrative: The patient tolerated radiation therapy relatively well. She developed fatigue and anticipated skin changes in the treatment field.   Plan: The patient will receive a call in about one month from the radiation oncology department. She will continue follow up with Dr. Chryl Heck as well.   ________________________________________________    Carola Rhine, [facility]

## 2022-05-10 DIAGNOSIS — H6121 Impacted cerumen, right ear: Secondary | ICD-10-CM | POA: Diagnosis not present

## 2022-05-10 DIAGNOSIS — M810 Age-related osteoporosis without current pathological fracture: Secondary | ICD-10-CM | POA: Diagnosis not present

## 2022-05-10 DIAGNOSIS — C50911 Malignant neoplasm of unspecified site of right female breast: Secondary | ICD-10-CM | POA: Diagnosis not present

## 2022-05-11 ENCOUNTER — Ambulatory Visit: Payer: Medicare Other | Admitting: Physical Therapy

## 2022-05-11 ENCOUNTER — Telehealth: Payer: Self-pay

## 2022-05-11 DIAGNOSIS — M6281 Muscle weakness (generalized): Secondary | ICD-10-CM

## 2022-05-11 DIAGNOSIS — Z9181 History of falling: Secondary | ICD-10-CM

## 2022-05-11 DIAGNOSIS — R262 Difficulty in walking, not elsewhere classified: Secondary | ICD-10-CM

## 2022-05-11 DIAGNOSIS — R252 Cramp and spasm: Secondary | ICD-10-CM | POA: Diagnosis not present

## 2022-05-11 NOTE — Telephone Encounter (Signed)
Pt called and LVM stating she spoke with her PCP and anastrozole seems to be the best fit for her in relation to prevention of breast cancer. She states she will be starting this Wednesday. Attempted to call pt and left voicemail for call back. Dr Chryl Heck has ordered for pt to have Tamoxifen per pt's hx of bone density scans showing osteoporosis. We need to discuss this further with the pt's in regards to treatment. Asked pt to call us back.

## 2022-05-11 NOTE — Therapy (Signed)
OUTPATIENT PHYSICAL THERAPY TREATMENT NOTE   Patient Name: Rhonda Richardson MRN: [id number] DOB:03-09-1941, 82 y.o., female Today's Date: 05/11/2022  PCP: Collene Leyden, MD REFERRING PROVIDER: Benay Pike, MD   PT End of Session - 05/11/22 0928     Visit Number 19    Date for PT Re-Evaluation 05/26/22    Authorization Type Medicare    Progress Note Due on Visit 20    PT Start Time 0930    PT Stop Time 1010    PT Time Calculation (min) 40 min    Activity Tolerance Patient tolerated treatment well             Past Medical History:  Diagnosis Date   Breast cancer ([place])    Hypertension    Past Surgical History:  Procedure Laterality Date   BREAST LUMPECTOMY WITH RADIOACTIVE SEED AND SENTINEL LYMPH NODE BIOPSY Right 09/15/2021   Procedure: RIGHT BREAST SEED BRACKETED LUMPECTOMY AND SENTINEL NODE BIOPSY;  Surgeon: [name], MD;  Location: [place];  Service: General;  Laterality: Right;   cataract surgery Bilateral    DILATION AND CURETTAGE OF UTERUS  1970   EXCISION OF BREAST LESION Right 10/15/2021   Procedure: ASPIRATION RIGHT AXILLARY SEROMA;  Surgeon: [name], MD;  Location: [place];  Service: General;  Laterality: Right;   eyelid tendon repair Bilateral    PORTACATH PLACEMENT N/A 09/15/2021   Procedure: PORT PLACEMENT;  Surgeon: [name], MD;  Location: [place];  Service: General;  Laterality: N/A;   RE-EXCISION OF BREAST LUMPECTOMY Right 10/15/2021   Procedure: RE-EXCISION LUMPECTOMY RIGHT BREAST;  Surgeon: [name], MD;  Location: [place];  Service: General;  Laterality: Right;   Patient Active Problem List   Diagnosis Date Noted   Port-A-Cath in place 11/18/2021   Genetic testing 09/28/2021   Malignant neoplasm of upper-outer quadrant of right breast in female, estrogen receptor positive ([place]) 09/08/2021    REFERRING DIAG: right breast cancer at risk for lymphedema  THERAPY DIAG:  Muscle weakness  (generalized)  History of falling  Difficulty in walking, not elsewhere classified  PERTINENT HISTORY: right UE Lymphedema risk, None   PRECAUTIONS: right UE Lymphedema risk, None  SUBJECTIVE:   Been taking care of my grandbabies in [place].  Finished radiation.  Hemoglobin is 10.3 so taking iron.  May be going on a cancer drug that decreased bone mineral density.     PAIN:  Are you having pain? No  SOZO SCREENING: Patient was assessed today using the SOZO machine to determine the lymphedema index score. This was compared to her baseline score. It was determined that she is within the recommended range when compared to her baseline and no further action is needed at this time. She will continue SOZO screenings. These are done every 3 months for 2 years post operatively followed by every 6 months for 2 years, and then annually.   Ortho Re-assessment visit:   PRECAUTIONS: Other: Breast cancer   WEIGHT BEARING RESTRICTIONS No   FALLS:  Has patient fallen in last 6 months? Yes on 11/13/2021 she fell after the chemotherapy treatment due to sciatica.  LIVING ENVIRONMENT: Lives with: lives with their spouse   OCCUPATION: part time   PLOF: Independent   PATIENT GOALS pelvic floor education   PERTINENT HISTORY:  Patient was diagnosed on 08/12/2021 with right grade 2 invasive ductal carcinoma breast cancer. She had a right lumpectomy and sentinel node biopsy (0/4 nodes positive) on 09/15/2021 followed by a  re-excision on 10/15/2021. It is triple positive with a Ki67 of 30%. Presently having chemotherapy.    BOWEL MOVEMENT Pain with bowel movement: No   URINATION Pain with urination: No Fully empty bladder: No   INTERCOURSE Pain with intercourse:  none and uses lubricant   PREGNANCY Vaginal deliveries 3 Tearing No C-section deliveries 0 Currently pregnant No   OBJECTIVE:    DIAGNOSTIC FINDINGS:  none     COGNITION:            Overall cognitive status: Within functional  limits for tasks assessed                          SENSATION:            Light touch: Appears intact on left, decreased on lateral and medial right lower leg            Proprioception: Appears intact                  POSTURE: No Significant postural limitations     Sit to stand 5 times 15 seconds.  TUG 11 sec Stand on right leg 3 sec Stand on left leg 15 sec with no issues Tandem stance with left leg in front 30 sec Tandem stance with right leg in front  30 sec with increased body sway     Recheck 10/26:  5x sit to stand:  12 sec                             TUG:  7 sec  04/21/22: 5 x sit to stand:  8 sec               TUG: 6.07 sec                 PELVIC ALIGNMENT:   LUMBARAROM/PROM   A/PROM A/PROM  eval 9/21  Extension Decreased by 25% WFLs    LOWER EXTREMITY ZOX:WRUEAVWUJ hip ROM is full   LOWER EXTREMITY MMT:   MMT Right eval Left eval 9/21 10/31 04/21/22  Hip flexion 4/5 5/5     4-  Hip extension 4/5 4/5 4 right/left  4+/5 left/right   Hip abduction 3+/5 3+/5 4- right/left  4+/5 left/right 4+  Hip external rotation 4/5 4/5     4  Knee flexion 5/5 5/5     5  Knee extension 4/5 5/5     5  Ankle dorsiflexion 4/5 5/5 4+ right  4+/5 right 4+  Ankle plantarflexion 2/5 3/5     4  Ankle inversion 5/5 5/5     5  Ankle eversion 5/5 5/5     5     PALPATION:   General  no tenderness                       TODAY'S TREATMENT  1/30 Nu-Step new model L5 x 7 min Squat to table holding 5# kettlebell under chin x 10 8# dumbbell to tall cone 10x;   2nd set 8# dumbbell to floor 10x 8# dumbell carry up and down steps 4x Alternating toe taps to BOSU holding 5# kettlebell for balance challenge Foot prop on BOSU with head turns and weight pass for balance challenge 8# step up up on 6 inch step 10x right/left Circles on floor quick step agility Leg Press x 25 with 75lbs Therapeutic activities:  sit  to stand, standing, walking, negotiating curbs, steps, lifting, pushing,  pulling         04/29/22: Recumbent bike x 5 min level 2 Sit to stand x 10  Squat to table x 10 Cone touches in SLS 3 x 10 each LE (cone on mat table) Step up and hold with opposite knee lift on 6" step x 20 each LE Step taps on 8" step x 20 Step up on 8" step and squat, alternating LE's each rep x 10 Lateral band walks x 3 laps at barre (approx 10 ft) Rocker board x 2 min Leg Press x 20 with 70lbs Seated LAQ x 20 with 5 lb Seated March x 20 with 5 lb Seated hip ER x 20 with 5 lb  04/27/22: Nu-step x 5 min level Sit to stand x 10 with 5lb kb Step taps on bottom shelf of cabinet x 2 min Lateral band walks x 5 laps at back counter Seated LAQ x 20 with 5 lb Seated March x 20 with 5 lb Seated hip ER x 20 with 5 lb Seated up and over hurdle with 5 lb 2 x 10 Marching on balance pad x 20 Squats on balance pad x 20 Lunge to BOSU x 10 each LE fwd then lateral Rocker board x 2 min Cone touches in SLS 3 x 10 each LE (cone on mat table)     PATIENT EDUCATION:  Education details: education on vaginal moisturizers and lubricants. Handed patient samples of each.  Person educated: Patient Education method: Theatre stage manager Education comprehension: verbalized understanding     HOME EXERCISE PROGRAM: Access Code: [email or web address] URL: [email or web address] Date: 02/04/2022 Prepared by: Ruben Im   Exercises - Seated Hip External Rotation Stretch  - 1 x daily - 7 x weekly - 1 sets - 3 reps - 30 hold - Seated Slump Nerve Glide  - 1 x daily - 7 x weekly - 1 sets - 5 reps - Seated March  - 1 x daily - 7 x weekly - 1 sets - 10 reps - Sit to Stand  - 1 x daily - 7 x weekly - 1 sets - 10 reps - Seated Hip Abduction with Resistance  - 1 x daily - 7 x weekly - 1 sets - 10 reps - Forward Step Up with Counter Support  - 1 x daily - 7 x weekly - 1 sets - 10 reps - Seated Hip Flexion March with Ankle Weights  - 1 x daily - 7 x weekly - 10 reps - Seated Knee Extension  with Resistance  - 1 x daily - 7 x weekly - 1 sets - 10 reps Given red band for home   ASSESSMENT:   CLINICAL IMPRESSION: Steady progression of ex intensity.  Therapist closely monitoring for excessive fatigue since her hemoglobin levels remain low.  Added in a few rest breaks between more challenging ex's.  Challenged with narrow base of support and head movements no physical assistance required.       OBJECTIVE IMPAIRMENTS Abnormal gait, decreased activity tolerance, decreased balance, decreased coordination, decreased endurance, difficulty walking, and decreased strength.    ACTIVITY LIMITATIONS lifting, bending, squatting, stairs, transfers, and locomotion level   PARTICIPATION LIMITATIONS: cleaning, laundry, shopping, community activity, and occupation   PERSONAL FACTORS Age, Fitness, Past/current experiences, and 1-2 comorbidities: breast cancer and having chemotherapy  are also affecting patient's functional outcome.    REHAB POTENTIAL: Excellent   CLINICAL DECISION MAKING: Evolving/moderate complexity   EVALUATION COMPLEXITY:  Moderate     GOALS: Goals reviewed with patient? Yes   SHORT TERM GOALS: Target date: 01/13/2022   Patient is independent with initial HEP for balance and strength.  Baseline: Goal status: goal met 10/26   2.  Patient reports her endurance and steadiness on her right leg has improved >/= 25% the 2 weeks prior to her chemotherapy treatments.  Baseline:  Goal status: met 10/26   3.  Patient educated on vaginal moisturizers and lubricants to promote good vaginal health.  Baseline:  Goal status: MET 12/16/2021   LONG TERM GOALS: Target date: 03/10/2022    Patient independent with advanced HEP for balance and strength to reduce the chance of falls.  Baseline:  Goal status: INITIAL   2.  Sit to stand 5x is </= 12.6 seconds to reduce the chance of her falling.  Baseline:  Goal status: goal met 10/26   3.  Patient able to stand on right leg for 15  seconds so she is able to go up and down steps with step over step pattern.  Baseline: 04/21/22 8 sec left, 15 sec right Goal status: INITIAL   4.  Tandem stance with right leg for 30 seconds with </= 50% less body swaying due to improved strength and balance.  Baseline:  Goal status:MET   5.  Patient is able to squat and stand up with >/= 50% greater ease due to increased in right lower extremity strength.  Baseline:  Goal status: INITIAL     PLAN: PT FREQUENCY: 2x/week   PT DURATION: 12 weeks   PLANNED INTERVENTIONS: Therapeutic exercises, Therapeutic activity, Neuromuscular re-education, Balance training, Gait training, Patient/Family education, Self Care, and Stair training   PLAN FOR NEXT SESSION:   20th visit progress note;  resistance training;  continue 1-2  times per week x 5 weeks.  Patient will be out of town one of these weeks.  Therefore treatment weeks will be a total of 4 weeks.  Focus on leg strength, functional training and balance training;    Ruben Im, PT 05/11/22 10:13 AM Phone: [phone] Fax: [place] [facility] [address] [place], [place] [address] Phone # [phone] Fax [phone]

## 2022-05-12 ENCOUNTER — Other Ambulatory Visit: Payer: Self-pay | Admitting: Family Medicine

## 2022-05-12 ENCOUNTER — Other Ambulatory Visit: Payer: Self-pay

## 2022-05-12 ENCOUNTER — Telehealth: Payer: Self-pay

## 2022-05-12 DIAGNOSIS — Z170 Estrogen receptor positive status [ER+]: Secondary | ICD-10-CM

## 2022-05-12 DIAGNOSIS — M810 Age-related osteoporosis without current pathological fracture: Secondary | ICD-10-CM

## 2022-05-12 NOTE — Telephone Encounter (Signed)
Pt returned call from yesterday, when she called and states she wants to move forward with Anastrozole as opposed to Tamoxifen which is ordered. Pt states she is not currently on bi phosphonate therapy; however, her most recent DEXA in 2022 shows T-score of -3.0. Pt states Dr Chryl Heck originally ordered Anastrozole then D/C the order and entered Tamoxifen. Her pharmacist had already filled the Anastrozole, and after speaking with them, their recommendation was for her to take Anastrozole rather than Tamoxifen as it is more effective. Advised pt given her t-score she should take Tamoxifen as prescribed. She states, "I have had 2 falls and neither of them I've broken anything. 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 Pt states she prefers to have a longer appt as her last one was brief and she did not feel she got all the necessary information out of her appt. She will see MD 05/18/22 at 0945. Routing telephone message routed to MD to make her aware.

## 2022-05-13 ENCOUNTER — Ambulatory Visit: Payer: Medicare Other | Attending: Hematology and Oncology | Admitting: Physical Therapy

## 2022-05-13 DIAGNOSIS — R252 Cramp and spasm: Secondary | ICD-10-CM | POA: Insufficient documentation

## 2022-05-13 DIAGNOSIS — Z9181 History of falling: Secondary | ICD-10-CM

## 2022-05-13 DIAGNOSIS — R262 Difficulty in walking, not elsewhere classified: Secondary | ICD-10-CM

## 2022-05-13 DIAGNOSIS — M6281 Muscle weakness (generalized): Secondary | ICD-10-CM | POA: Diagnosis not present

## 2022-05-13 NOTE — Therapy (Signed)
OUTPATIENT PHYSICAL THERAPY TREATMENT NOTE   Patient Name: Rhonda Richardson MRN: [id number] DOB:04-Oct-1940, 82 y.o., female Today's Date: 05/13/2022  PCP: Collene Leyden, MD REFERRING PROVIDER: Benay Pike, MD   Progress Note Reporting Period 02/09/22 to 05/13/22  See note below for Objective Data and Assessment of Progress/Goals.       PT End of Session - 05/13/22 1102     Visit Number 20    Date for PT Re-Evaluation 05/26/22    Authorization Type Medicare    Progress Note Due on Visit 30    PT Start Time 1101    PT Stop Time 1140    PT Time Calculation (min) 39 min    Activity Tolerance Patient tolerated treatment well             Past Medical History:  Diagnosis Date   Breast cancer ([place])    Hypertension    Past Surgical History:  Procedure Laterality Date   BREAST LUMPECTOMY WITH RADIOACTIVE SEED AND SENTINEL LYMPH NODE BIOPSY Right 09/15/2021   Procedure: RIGHT BREAST SEED BRACKETED LUMPECTOMY AND SENTINEL NODE BIOPSY;  Surgeon: [name], MD;  Location: [facility];  Service: General;  Laterality: Right;   cataract surgery Bilateral    DILATION AND CURETTAGE OF UTERUS  1970   EXCISION OF BREAST LESION Right 10/15/2021   Procedure: ASPIRATION RIGHT AXILLARY SEROMA;  Surgeon: [name], MD;  Location: [facility];  Service: General;  Laterality: Right;   eyelid tendon repair Bilateral    PORTACATH PLACEMENT N/A 09/15/2021   Procedure: PORT PLACEMENT;  Surgeon: [name], MD;  Location: [facility];  Service: General;  Laterality: N/A;   RE-EXCISION OF BREAST LUMPECTOMY Right 10/15/2021   Procedure: RE-EXCISION LUMPECTOMY RIGHT BREAST;  Surgeon: [name], MD;  Location: [facility];  Service: General;  Laterality: Right;   Patient Active Problem List   Diagnosis Date Noted   Port-A-Cath in place 11/18/2021   Genetic testing 09/28/2021   Malignant neoplasm of upper-outer quadrant of right breast in female, estrogen receptor positive  ([place]) 09/08/2021    REFERRING DIAG: right breast cancer at risk for lymphedema  THERAPY DIAG:  Muscle weakness (generalized)  History of falling  Difficulty in walking, not elsewhere classified  PERTINENT HISTORY: right UE Lymphedema risk, None   PRECAUTIONS: right UE Lymphedema risk, None  SUBJECTIVE:   some left shoulder soreness after reaching into the back seat of the car from the front seat but it seems OK today  PAIN:  Are you having pain? No  SOZO SCREENING: Patient was assessed today using the SOZO machine to determine the lymphedema index score. This was compared to her baseline score. It was determined that she is within the recommended range when compared to her baseline and no further action is needed at this time. She will continue SOZO screenings. These are done every 3 months for 2 years post operatively followed by every 6 months for 2 years, and then annually.   Ortho Re-assessment visit:   PRECAUTIONS: Other: Breast cancer   WEIGHT BEARING RESTRICTIONS No   FALLS:  Has patient fallen in last 6 months? Yes on 11/13/2021 she fell after the chemotherapy treatment due to sciatica.  LIVING ENVIRONMENT: Lives with: lives with their spouse   OCCUPATION: part time   PLOF: Independent   PATIENT GOALS pelvic floor education   PERTINENT HISTORY:  Patient was diagnosed on 08/12/2021 with right grade 2 invasive ductal carcinoma breast cancer. She had a right lumpectomy  and sentinel node biopsy (0/4 nodes positive) on 09/15/2021 followed by a re-excision on 10/15/2021. It is triple positive with a Ki67 of 30%. Presently having chemotherapy.    BOWEL MOVEMENT Pain with bowel movement: No   URINATION Pain with urination: No Fully empty bladder: No   INTERCOURSE Pain with intercourse:  none and uses lubricant   PREGNANCY Vaginal deliveries 3 Tearing No C-section deliveries 0 Currently pregnant No   OBJECTIVE:    DIAGNOSTIC FINDINGS:  none     COGNITION:             Overall cognitive status: Within functional limits for tasks assessed                          SENSATION:            Light touch: Appears intact on left, decreased on lateral and medial right lower leg            Proprioception: Appears intact                  POSTURE: No Significant postural limitations     Sit to stand 5 times 15 seconds.  TUG 11 sec Stand on right leg 3 sec Stand on left leg 15 sec with no issues Tandem stance with left leg in front 30 sec Tandem stance with right leg in front  30 sec with increased body sway     Recheck 10/26:  5x sit to stand:  12 sec                             TUG:  7 sec  04/21/22: 5 x sit to stand:  8 sec               TUG: 6.07 sec  2/1:  5x 8.91 sec         TUG  6.61 SLS right > 30 sec                 PELVIC ALIGNMENT:   LUMBARAROM/PROM   A/PROM A/PROM  eval 9/21 2/1  Extension Decreased by 25% WFLs Full in all planes    LOWER EXTREMITY AST:MHDQQIWLN hip ROM is full   LOWER EXTREMITY MMT:   MMT Right eval Left eval 9/21 10/31 04/21/22 2/1  Hip flexion 4/5 5/5     4- 4  Hip extension 4/5 4/5 4 right/left  4+/5 left/right    Hip abduction 3+/5 3+/5 4- right/left  4+/5 left/right 4+ 4+  Hip external rotation 4/5 4/'5     4 4  '$ Knee flexion 5/5 5/'5     5 5  '$ Knee extension 4/5 5/'5     5 5  '$ Ankle dorsiflexion 4/5 5/5 4+ right  4+/5 right 4+ 4+  Ankle plantarflexion 2/5 3/5     4 4+  Ankle inversion 5/5 5/'5     5 5  '$ Ankle eversion 5/5 5/'5     5 5     '$ PALPATION:   General  no tenderness                       TODAY'S TREATMENT  2/1: Nu-Step new model L5 x 5 min (no left arm use today) 5x sit to stand SLS TUG 30# barbell deadlift from corner of the mat table (knee level) 10x 10# kettlebell below chin squats 10x (to simulate lifting 2  yr old grandchild) Holding 10# kettlebell step ups 10x right/left (to simulate holding infant grandchild) Holding pink light loop around wrists/palm with march 30 sec; reach toward  floor 10x Purple loop X-heavy hip press forward 10x Standing lat bar 25# 10x Leg Press seat 6 (may try seat 5 next time) x 15 with 85lbs Therapeutic activities:  sit to stand, standing, walking, negotiating curbs, steps, lifting grandchildren, pushing, pulling      1/30 Nu-Step new model L5 x 7 min Squat to table holding 5# kettlebell under chin x 10 8# dumbbell to tall cone 10x;   2nd set 8# dumbbell to floor 10x 8# dumbell carry up and down steps 4x Alternating toe taps to BOSU holding 5# kettlebell for balance challenge Foot prop on BOSU with head turns and weight pass for balance challenge 8# step up up on 6 inch step 10x right/left Circles on floor quick step agility Leg Press x 25 with 75lbs Therapeutic activities:  sit to stand, standing, walking, negotiating curbs, steps, lifting, pushing, pulling         04/29/22: Recumbent bike x 5 min level 2 Sit to stand x 10  Squat to table x 10 Cone touches in SLS 3 x 10 each LE (cone on mat table) Step up and hold with opposite knee lift on 6" step x 20 each LE Step taps on 8" step x 20 Step up on 8" step and squat, alternating LE's each rep x 10 Lateral band walks x 3 laps at barre (approx 10 ft) Rocker board x 2 min Leg Press x 20 with 70lbs Seated LAQ x 20 with 5 lb Seated March x 20 with 5 lb Seated hip ER x 20 with 5 lb     PATIENT EDUCATION:  Education details: education on vaginal moisturizers and lubricants. Handed patient samples of each.  Person educated: Patient Education method: Theatre stage manager Education comprehension: verbalized understanding     HOME EXERCISE PROGRAM: Access Code: [email or web address] URL: [email or web address] Date: 02/04/2022 Prepared by: Ruben Im   Exercises - Seated Hip External Rotation Stretch  - 1 x daily - 7 x weekly - 1 sets - 3 reps - 30 hold - Seated Slump Nerve Glide  - 1 x daily - 7 x weekly - 1 sets - 5 reps - Seated March  - 1 x daily - 7 x weekly  - 1 sets - 10 reps - Sit to Stand  - 1 x daily - 7 x weekly - 1 sets - 10 reps - Seated Hip Abduction with Resistance  - 1 x daily - 7 x weekly - 1 sets - 10 reps - Forward Step Up with Counter Support  - 1 x daily - 7 x weekly - 1 sets - 10 reps - Seated Hip Flexion March with Ankle Weights  - 1 x daily - 7 x weekly - 10 reps - Seated Knee Extension with Resistance  - 1 x daily - 7 x weekly - 1 sets - 10 reps Given red band for home   ASSESSMENT:   CLINICAL IMPRESSION: The patient is progressing very well with functional tests, single leg balance and LE strength.  Treatment focus on lifting and squatting as she needs when babysitting her infant and 25 year old grandchildren and going up and down the stairs numerous times.  "I get winded with steps."  Progressing well with rehab goals.  Modified some ex's to limit excessive UE ex's secondary to some discomfort this week in left  shoulder.         OBJECTIVE IMPAIRMENTS Abnormal gait, decreased activity tolerance, decreased balance, decreased coordination, decreased endurance, difficulty walking, and decreased strength.    ACTIVITY LIMITATIONS lifting, bending, squatting, stairs, transfers, and locomotion level   PARTICIPATION LIMITATIONS: cleaning, laundry, shopping, community activity, and occupation   PERSONAL FACTORS Age, Fitness, Past/current experiences, and 1-2 comorbidities: breast cancer and having chemotherapy  are also affecting patient's functional outcome.    REHAB POTENTIAL: Excellent   CLINICAL DECISION MAKING: Evolving/moderate complexity   EVALUATION COMPLEXITY: Moderate     GOALS: Goals reviewed with patient? Yes   SHORT TERM GOALS: Target date: 01/13/2022   Patient is independent with initial HEP for balance and strength.  Baseline: Goal status: goal met 10/26   2.  Patient reports her endurance and steadiness on her right leg has improved >/= 25% the 2 weeks prior to her chemotherapy treatments.  Baseline:  Goal  status: met 10/26   3.  Patient educated on vaginal moisturizers and lubricants to promote good vaginal health.  Baseline:  Goal status: MET 12/16/2021   LONG TERM GOALS: Target date: 05/26/2022  Patient independent with advanced HEP for balance and strength to reduce the chance of falls.  Baseline:  Goal status: INITIAL   2.  Sit to stand 5x is </= 12.6 seconds to reduce the chance of her falling.  Baseline:  Goal status: goal met 10/26   3.  Patient able to stand on right leg for 15 seconds so she is able to go up and down steps with step over step pattern.  Baseline: 04/21/22 8 sec left, 15 sec right Goal status: met 2/1   4.  Tandem stance with right leg for 30 seconds with </= 50% less body swaying due to improved strength and balance.  Baseline:  Goal status:MET   5.  Patient is able to squat and stand up with >/= 50% greater ease due to increased in right lower extremity strength.  Baseline:  Goal status: ongoing      PLAN: PT FREQUENCY: 2x/week   PT DURATION: 12 weeks   PLANNED INTERVENTIONS: Therapeutic exercises, Therapeutic activity, Neuromuscular re-education, Balance training, Gait training, Patient/Family education, Self Care, and Stair training   PLAN FOR NEXT SESSION:    resistance training;  continue 1-2  times per week x 5 weeks.  Patient will be out of town one of these weeks.  Therefore treatment weeks will be a total of 4 weeks.  Focus on leg strength, functional training and balance training;    Ruben Im, PT 05/13/22 11:43 AM Phone: [phone] Fax: [phone]  [facility] [address] [place], [place] [address] Phone # [phone] Fax [phone]

## 2022-05-14 ENCOUNTER — Other Ambulatory Visit: Payer: Self-pay

## 2022-05-17 ENCOUNTER — Inpatient Hospital Stay: Payer: Medicare Other | Attending: Hematology and Oncology

## 2022-05-17 ENCOUNTER — Inpatient Hospital Stay: Payer: Medicare Other

## 2022-05-17 VITALS — BP 150/79 | HR 87 | Temp 97.9°F | Resp 18 | Wt 137.0 lb

## 2022-05-17 DIAGNOSIS — Z79811 Long term (current) use of aromatase inhibitors: Secondary | ICD-10-CM | POA: Diagnosis not present

## 2022-05-17 DIAGNOSIS — Z170 Estrogen receptor positive status [ER+]: Secondary | ICD-10-CM | POA: Insufficient documentation

## 2022-05-17 DIAGNOSIS — C50411 Malignant neoplasm of upper-outer quadrant of right female breast: Secondary | ICD-10-CM | POA: Insufficient documentation

## 2022-05-17 DIAGNOSIS — Z8249 Family history of ischemic heart disease and other diseases of the circulatory system: Secondary | ICD-10-CM | POA: Diagnosis not present

## 2022-05-17 DIAGNOSIS — D649 Anemia, unspecified: Secondary | ICD-10-CM | POA: Insufficient documentation

## 2022-05-17 DIAGNOSIS — Z5112 Encounter for antineoplastic immunotherapy: Secondary | ICD-10-CM | POA: Diagnosis not present

## 2022-05-17 DIAGNOSIS — Z79899 Other long term (current) drug therapy: Secondary | ICD-10-CM | POA: Insufficient documentation

## 2022-05-17 DIAGNOSIS — Z881 Allergy status to other antibiotic agents status: Secondary | ICD-10-CM | POA: Diagnosis not present

## 2022-05-17 DIAGNOSIS — Z808 Family history of malignant neoplasm of other organs or systems: Secondary | ICD-10-CM | POA: Diagnosis not present

## 2022-05-17 DIAGNOSIS — K5900 Constipation, unspecified: Secondary | ICD-10-CM | POA: Diagnosis not present

## 2022-05-17 DIAGNOSIS — Z95828 Presence of other vascular implants and grafts: Secondary | ICD-10-CM

## 2022-05-17 DIAGNOSIS — Z5111 Encounter for antineoplastic chemotherapy: Secondary | ICD-10-CM | POA: Diagnosis present

## 2022-05-17 MED ORDER — HEPARIN SOD (PORK) LOCK FLUSH 100 UNIT/ML IV SOLN: 500.0000 [IU] | Freq: Once | INTRAVENOUS | Status: DC

## 2022-05-17 MED ORDER — SODIUM CHLORIDE 0.9% FLUSH: 10.0000 mL | Freq: Once | INTRAVENOUS | Status: DC

## 2022-05-17 MED ORDER — TRASTUZUMAB-HYALURONIDASE-OYSK 600-10000 MG-UNT/5ML ~~LOC~~ SOLN
600.0000 mg | Freq: Once | SUBCUTANEOUS | Status: AC
  Administered 2022-05-17: 600 mg via SUBCUTANEOUS
  Filled 2022-05-17: qty 5

## 2022-05-17 MED ORDER — ACETAMINOPHEN 325 MG PO TABS
650.0000 mg | ORAL_TABLET | Freq: Once | ORAL | Status: AC
  Administered 2022-05-17: 650 mg via ORAL
  Filled 2022-05-17: qty 2

## 2022-05-17 NOTE — Patient Instructions (Signed)
[facility]  Discharge Instructions: Thank you for choosing [facility] to provide your oncology and hematology care.   If you have a lab appointment with the Cancer Center, please go directly to the Cancer Center and check in at the registration area.   Wear comfortable clothing and clothing appropriate for easy access to any Portacath or PICC line.   We strive to give you quality time with your provider. You may need to reschedule your appointment if you arrive late (15 or more minutes).  Arriving late affects you and other patients whose appointments are after yours.  Also, if you miss three or more appointments without notifying the office, you may be dismissed from the clinic at the provider's discretion.      For prescription refill requests, have your pharmacy contact our office and allow 72 hours for refills to be completed.    Today you received the following chemotherapy and/or immunotherapy agents : Herceptin Hylecta      To help prevent nausea and vomiting after your treatment, we encourage you to take your nausea medication as directed.  BELOW ARE SYMPTOMS THAT SHOULD BE REPORTED IMMEDIATELY: *FEVER GREATER THAN 100.4 F (38 C) OR HIGHER *CHILLS OR SWEATING *NAUSEA AND VOMITING THAT IS NOT CONTROLLED WITH YOUR NAUSEA MEDICATION *UNUSUAL SHORTNESS OF BREATH *UNUSUAL BRUISING OR BLEEDING *URINARY PROBLEMS (pain or burning when urinating, or frequent urination) *BOWEL PROBLEMS (unusual diarrhea, constipation, pain near the anus) TENDERNESS IN MOUTH AND THROAT WITH OR WITHOUT PRESENCE OF ULCERS (sore throat, sores in mouth, or a toothache) UNUSUAL RASH, SWELLING OR PAIN  UNUSUAL VAGINAL DISCHARGE OR ITCHING   Items with * indicate a potential emergency and should be followed up as soon as possible or go to the Emergency Department if any problems should occur.  Please show the CHEMOTHERAPY ALERT CARD or IMMUNOTHERAPY ALERT CARD  at check-in to the Emergency Department and triage nurse.  Should you have questions after your visit or need to cancel or reschedule your appointment, please contact [facility]  Dept: [phone]  and follow the prompts.  Office hours are 8:00 a.m. to 4:30 p.m. Monday - Friday. Please note that voicemails left after 4:00 p.m. may not be returned until the following business day.  We are closed weekends and major holidays. You have access to a nurse at all times for urgent questions. Please call the main number to the clinic Dept: [phone] and follow the prompts.   For any non-urgent questions, you may also contact your provider using MyChart. We now offer e-Visits for anyone 18 and older to request care online for non-urgent symptoms. For details visit [email or web address].   Also download the MyChart app! Go to the app store, search "MyChart", open the app, select , and log in with your MyChart username and password.   

## 2022-05-18 ENCOUNTER — Inpatient Hospital Stay (HOSPITAL_BASED_OUTPATIENT_CLINIC_OR_DEPARTMENT_OTHER): Payer: Medicare Other | Admitting: Hematology and Oncology

## 2022-05-18 ENCOUNTER — Encounter: Payer: Self-pay | Admitting: Hematology and Oncology

## 2022-05-18 ENCOUNTER — Ambulatory Visit: Payer: Medicare Other

## 2022-05-18 VITALS — BP 140/61 | HR 87 | Temp 97.8°F | Resp 16 | Ht 62.0 in | Wt 137.6 lb

## 2022-05-18 DIAGNOSIS — Z9181 History of falling: Secondary | ICD-10-CM | POA: Diagnosis not present

## 2022-05-18 DIAGNOSIS — C50411 Malignant neoplasm of upper-outer quadrant of right female breast: Secondary | ICD-10-CM | POA: Diagnosis not present

## 2022-05-18 DIAGNOSIS — Z79811 Long term (current) use of aromatase inhibitors: Secondary | ICD-10-CM | POA: Diagnosis not present

## 2022-05-18 DIAGNOSIS — Z5112 Encounter for antineoplastic immunotherapy: Secondary | ICD-10-CM | POA: Diagnosis not present

## 2022-05-18 DIAGNOSIS — M6281 Muscle weakness (generalized): Secondary | ICD-10-CM | POA: Diagnosis not present

## 2022-05-18 DIAGNOSIS — R252 Cramp and spasm: Secondary | ICD-10-CM | POA: Diagnosis not present

## 2022-05-18 DIAGNOSIS — D649 Anemia, unspecified: Secondary | ICD-10-CM | POA: Diagnosis not present

## 2022-05-18 DIAGNOSIS — K5900 Constipation, unspecified: Secondary | ICD-10-CM | POA: Diagnosis not present

## 2022-05-18 DIAGNOSIS — Z170 Estrogen receptor positive status [ER+]: Secondary | ICD-10-CM | POA: Diagnosis not present

## 2022-05-18 DIAGNOSIS — R262 Difficulty in walking, not elsewhere classified: Secondary | ICD-10-CM | POA: Diagnosis not present

## 2022-05-18 NOTE — Progress Notes (Signed)
[facility] PROGRESS NOTE  Patient Care Team: Collene Leyden, MD as PCP - General (Family Medicine) [name], MD as Consulting Physician (General Surgery) Benay Pike, MD as Consulting Physician (Hematology and Oncology) Kyung Rudd, MD as Consulting Physician (Radiation Oncology) Mauro Kaufmann, RN as Oncology Nurse Navigator Rockwell Germany, RN as Oncology Nurse Navigator  CHIEF COMPLAINTS/PURPOSE OF CONSULTATION:  Breast cancer  SUMMARY OF ONCOLOGIC HISTORY: Oncology History  Malignant neoplasm of upper-outer quadrant of right breast in female, estrogen receptor positive ([place])  08/12/2021 Mammogram   Mammogram showed possible mass in the right breast.  Diagnostic mammogram showed 2 highly suspicious upper outer right breast masses measuring 1.5 cm in the 9:30 position and 1.4 cm at the 10 o'clock position.  These masses or distortions mammographically spanning a distance of up to 3.5 cm.  No abnormal appearing right axillary lymph nodes.   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  Prognostic showed ER 99% positive strong staining PR 10% positive moderate staining, HER2 positive 3+ and Ki-67 of 30%.   09/08/2021 Initial Diagnosis   Malignant neoplasm of upper-outer quadrant of right breast in female, estrogen receptor positive ([place])   09/15/2021 Pathology Results   She had right breast lumpectomy which showed invasive ductal carcinoma, 3.6 cm in maximal extent involving inferior and medial margins, DCIS approaching to less than 0.1 cm of closest margin no metastatic carcinoma identified in 4 out of 4 lymph nodes.  She is scheduled for repeat surgery given positive margins.  Prior prognostic showed ER 99% positive strong staining intensity PR 10% positive moderate staining intensity, HER2 positive.   09/23/2021 Genetic Testing   Negative hereditary cancer genetic testing: no pathogenic variants  detected in [facility] CustomNext-cancer +RNAinsight Panel.  Report date is September 23, 2021.   The CustomNext-Cancer+RNAinsight panel offered by [facility] includes sequencing and rearrangement analysis for the following 47 genes:  APC, ATM, AXIN2, BARD1, BMPR1A, BRCA1, BRCA2, BRIP1, CDH1, CDK4, CDKN2A, CHEK2, DICER1, EPCAM, GREM1, HOXB13, MEN1, MLH1, MSH2, MSH3, MSH6, MUTYH, NBN, NF1, NF2, NTHL1, PALB2, PMS2, POLD1, POLE, PTEN, RAD51C, RAD51D, RECQL, RET, SDHA, SDHAF2, SDHB, SDHC, SDHD, SMAD4, SMARCA4, STK11, TP53, TSC1, TSC2, and VHL.  RNA data is routinely analyzed for use in variant interpretation for all genes.   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 DCIS 0.25 mg from true/new margin.   11/10/2021 - 12/02/2021 Chemotherapy   Patient is on Treatment Plan : BREAST Docetaxel + Carboplatin + Trastuzumab ([place]) q21d / Trastuzumab q21d     11/10/2021 - 12/03/2021 Chemotherapy   Patient is on Treatment Plan : BREAST  Docetaxel + Carboplatin + Trastuzumab + Pertuzumab  ([facility]) q21d      11/10/2021 -  Chemotherapy   Patient is on Treatment Plan : BREAST Docetaxel + Carboplatin + Trastuzumab ([place]) q21d / Trastuzumab q21d      INTERVAL HISTORY:  DANNAE KATO returns for a follow up visit after receiving Cycle 6 of carboplatin/docetaxel and trastuzumab.  She is once again here for follow-up to review role of antiestrogen therapy and to determine her options.  Initially when we suggested anastrozole, she was a bit concerned about the bone density loss hence we prescribed tamoxifen.  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  She now wants to try anastrozole.  She understands that she has underlying osteoporosis but she wants to the best and wants to monitor the bone density. She  had other questions about ischemic events, cardiac risks from these medications etc.    She of course mentioned that she did not have much time to make the decision with regards to anastrozole versus tamoxifen.  She is however not very sure that she wants to proceed with anastrozole. Rest of the pertinent 10 point ROS reviewed and negative  MEDICAL HISTORY:  Past Medical History:  Diagnosis Date   Breast cancer ([place])    Hypertension     SURGICAL HISTORY: Past Surgical History:  Procedure Laterality Date   BREAST LUMPECTOMY WITH RADIOACTIVE SEED AND SENTINEL LYMPH NODE BIOPSY Right 09/15/2021   Procedure: RIGHT BREAST SEED BRACKETED LUMPECTOMY AND SENTINEL NODE BIOPSY;  Surgeon: [name], MD;  Location: [facility];  Service: General;  Laterality: Right;   cataract surgery Bilateral    DILATION AND CURETTAGE OF UTERUS  1970   EXCISION OF BREAST LESION Right 10/15/2021   Procedure: ASPIRATION RIGHT AXILLARY SEROMA;  Surgeon: [name], MD;  Location: [facility];  Service: General;  Laterality: Right;   eyelid tendon repair Bilateral    PORTACATH PLACEMENT N/A 09/15/2021   Procedure: PORT PLACEMENT;  Surgeon: [name], MD;  Location: [facility];  Service: General;  Laterality: N/A;   RE-EXCISION OF BREAST LUMPECTOMY Right 10/15/2021   Procedure: RE-EXCISION LUMPECTOMY RIGHT BREAST;  Surgeon: [name], MD;  Location: [facility];  Service: General;  Laterality: Right;    SOCIAL HISTORY: Social History   Socioeconomic History   Marital status: Married    Spouse name: Not on file   Number of children: Not on file   Years of education: Not on file   Highest education level: Not on file  Occupational History   Not on file  Tobacco Use   Smoking status: Never   Smokeless tobacco: Not on file  Vaping Use   Vaping Use: Never used  Substance and Sexual Activity   Alcohol use: Yes    Comment: 3- 4 drinks   Drug use: Never   Sexual activity: Not Currently   Other Topics Concern   Not on file  Social History Narrative   Not on file   Social Determinants of Health   Financial Resource Strain: Low Risk  (09/09/2021)   Overall Financial Resource Strain (CARDIA)    Difficulty of Paying Living Expenses: Not hard at all  Food Insecurity: No Food Insecurity (09/09/2021)   Hunger Vital Sign    Worried About Running Out of Food in the Last Year: Never true    Ran Out of Food in the Last Year: Never true  Transportation Needs: No Transportation Needs (09/09/2021)   PRAPARE - Hydrologist (Medical): No    Lack of Transportation (Non-Medical): No  Physical Activity: Not on file  Stress: Not on file  Social Connections: Not on file  Intimate Partner Violence: Not on file    FAMILY HISTORY: Family History  Problem Relation Age of Onset   Heart disease Father 79   Thyroid cancer Other 32       niece's daughter    ALLERGIES:  is allergic to macrodantin [nitrofurantoin].  MEDICATIONS:  Current Outpatient Medications  Medication Sig Dispense Refill   acetaminophen (TYLENOL) 325 MG tablet Take 325 mg by mouth every 6 (six)  hours as needed for moderate pain.     benazepril (LOTENSIN) 20 MG tablet Take 20 mg by mouth daily.     cholecalciferol (VITAMIN D3) 25 MCG (1000 UNIT) tablet Take 1,000 Units by mouth daily.     famotidine (PEPCID) 20 MG tablet Take 20 mg by mouth daily as needed for heartburn or indigestion.     hydrochlorothiazide (MICROZIDE) 12.5 MG capsule Take 12.5 mg by mouth daily.     ibuprofen (ADVIL) 200 MG tablet Take 200 mg by mouth every 6 (six) hours as needed for moderate pain.     iron polysaccharides (FERREX 150) 150 MG capsule Take 150 mg by mouth daily.     Magnesium 250 MG TABS Take 250 mg by mouth daily.     tamoxifen (NOLVADEX) 20 MG tablet Take 1 tablet (20 mg total) by mouth daily. 90 tablet 3   traMADol (ULTRAM) 50 MG tablet Take by mouth every 6 (six) hours as needed.      Vitamins-Lipotropics (MULTI-VITAMIN HP/MINERALS PO) Take 1 tablet by mouth daily.     No current facility-administered medications for this visit.    PHYSICAL EXAMINATION: ECOG PERFORMANCE STATUS: 0 - Asymptomatic  Vitals:   05/18/22 0945  BP: (!) 140/61  Pulse: 87  Resp: 16  Temp: 97.8 F (36.6 C)  SpO2: 100%     Filed Weights   05/18/22 0945  Weight: 137 lb 9.6 oz (62.4 kg)    Physical exam deferred today in lieu of counseling  LABORATORY DATA:  I have reviewed the data as listed Lab Results  Component Value Date   WBC 4.1 04/26/2022   HGB 10.5 (L) 04/26/2022   HCT 31.9 (L) 04/26/2022   MCV 84.8 04/26/2022   PLT 209 04/26/2022   Lab Results  Component Value Date   NA 140 04/26/2022   K 3.8 04/26/2022   CL 105 04/26/2022   CO2 29 04/26/2022    RADIOGRAPHIC STUDIES: I have personally reviewed the radiological reports and agreed with the findings in the report.  ASSESSMENT AND PLAN:  AJAHNAE RATHGEBER is a 82 y.o. female who presents for a follow up for right breast cancer.   #Malignant neoplasm of upper-outer quadrant of right breast in female, estrogen receptor positive  --ER/PR positive, HER2 amplified.  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 Media margin showed focal high-grade ductal carcinoma in situ, solid type with necrosis, negative for invasive carcinoma. Inferior and posterior margin negative for carcinoma.  --Given large tumor, triple positive, recommend dose modified TCH for 6 cycles followed by adjuvant herceptin vs taxol with herceptin. -- Most recent ECHO with EF of 55-60%, will continue to monitor. She is clinically asymptomatic. We once again had a long discussion about tamoxifen versus anastrozole.  She has underlying bone density consistent with osteoporosis.  Hence she was worried  about taking anastrozole however now after thinking about it and collecting more information, she feels like she is more confident about starting anastrozole.  I think this is perfectly reasonable as long as we monitor the bone density.  I recommend that she repeat the bone density which is already scheduled.  She will also consider following up with endocrinology since she took Fosamax in the past.  She clearly understands increased risk of cardiovascular events with anastrozole as well as increased risk of bone density loss and fractures with bone density loss.  She is okay with continuing anastrozole at this point.  In the big picture since this is a HER2 amplified breast cancer, I do not believe there is a significant difference in benefit with tamoxifen versus anastrozole however I am perfectly fine with refilling anastrozole.  She once again had a few questions about cardiovascular events risk, definition of ischemic heart disease how she can prevent this.  RTC in 6 weeks.    I have spent a total of 40 minutes minutes of face-to-face and non-face-to-face time, preparing to see the patient, performing a medically appropriate examination, counseling and educating the patient, documenting clinical information in the electronic health record, and care coordination.

## 2022-05-18 NOTE — Therapy (Signed)
OUTPATIENT PHYSICAL THERAPY TREATMENT NOTE   Patient Name: Rhonda Richardson MRN: [id number] DOB:[birth year], 82 y.o., female Today's Date: 05/18/2022  PCP: Collene Leyden, MD REFERRING PROVIDER: Benay Pike, MD   Progress Note Reporting Period 02/09/22 to 05/13/22  See note below for Objective Data and Assessment of Progress/Goals.       PT End of Session - 05/18/22 1410     Visit Number 21    Date for PT Re-Evaluation 05/26/22    Authorization Type Medicare    Progress Note Due on Visit 30    PT Start Time 1400    PT Stop Time 1445    PT Time Calculation (min) 45 min    Activity Tolerance Patient tolerated treatment well    Behavior During Therapy WFL for tasks assessed/performed             Past Medical History:  Diagnosis Date   Breast cancer ([place])    Hypertension    Past Surgical History:  Procedure Laterality Date   BREAST LUMPECTOMY WITH RADIOACTIVE SEED AND SENTINEL LYMPH NODE BIOPSY Right 09/15/2021   Procedure: RIGHT BREAST SEED BRACKETED LUMPECTOMY AND SENTINEL NODE BIOPSY;  Surgeon: [name], MD;  Location: [facility];  Service: General;  Laterality: Right;   cataract surgery Bilateral    DILATION AND CURETTAGE OF UTERUS  1970   EXCISION OF BREAST LESION Right 10/15/2021   Procedure: ASPIRATION RIGHT AXILLARY SEROMA;  Surgeon: [name], MD;  Location: [facility];  Service: General;  Laterality: Right;   eyelid tendon repair Bilateral    PORTACATH PLACEMENT N/A 09/15/2021   Procedure: PORT PLACEMENT;  Surgeon: [name], MD;  Location: [facility];  Service: General;  Laterality: N/A;   RE-EXCISION OF BREAST LUMPECTOMY Right 10/15/2021   Procedure: RE-EXCISION LUMPECTOMY RIGHT BREAST;  Surgeon: [name], MD;  Location: [facility];  Service: General;  Laterality: Right;   Patient Active Problem List   Diagnosis Date Noted   Port-A-Cath in place 11/18/2021   Genetic testing 09/28/2021   Malignant neoplasm of upper-outer  quadrant of right breast in female, estrogen receptor positive ([place]) 09/08/2021    REFERRING DIAG: right breast cancer at risk for lymphedema  THERAPY DIAG:  Muscle weakness (generalized)  History of falling  Difficulty in walking, not elsewhere classified  Cramp and spasm  PERTINENT HISTORY: right UE Lymphedema risk, None   PRECAUTIONS: right UE Lymphedema risk, None  SUBJECTIVE:   No new issues.  "Just feeling a little sluggish today"  PAIN:  Are you having pain? No  SOZO SCREENING: Patient was assessed today using the SOZO machine to determine the lymphedema index score. This was compared to her baseline score. It was determined that she is within the recommended range when compared to her baseline and no further action is needed at this time. She will continue SOZO screenings. These are done every 3 months for 2 years post operatively followed by every 6 months for 2 years, and then annually.   Ortho Re-assessment visit:   PRECAUTIONS: Other: Breast cancer   WEIGHT BEARING RESTRICTIONS No   FALLS:  Has patient fallen in last 6 months? Yes on 11/13/2021 she fell after the chemotherapy treatment due to sciatica.  LIVING ENVIRONMENT: Lives with: lives with their spouse   OCCUPATION: part time   PLOF: Independent   PATIENT GOALS pelvic floor education   PERTINENT HISTORY:  Patient was diagnosed on 08/12/2021 with right grade 2 invasive ductal carcinoma breast cancer. She had a  right lumpectomy and sentinel node biopsy (0/4 nodes positive) on 09/15/2021 followed by a re-excision on 10/15/2021. It is triple positive with a Ki67 of 30%. Presently having chemotherapy.    BOWEL MOVEMENT Pain with bowel movement: No   URINATION Pain with urination: No Fully empty bladder: No   INTERCOURSE Pain with intercourse:  none and uses lubricant   PREGNANCY Vaginal deliveries 3 Tearing No C-section deliveries 0 Currently pregnant No   OBJECTIVE:    DIAGNOSTIC FINDINGS:   none     COGNITION:            Overall cognitive status: Within functional limits for tasks assessed                          SENSATION:            Light touch: Appears intact on left, decreased on lateral and medial right lower leg            Proprioception: Appears intact                  POSTURE: No Significant postural limitations     Sit to stand 5 times 15 seconds.  TUG 11 sec Stand on right leg 3 sec Stand on left leg 15 sec with no issues Tandem stance with left leg in front 30 sec Tandem stance with right leg in front  30 sec with increased body sway     Recheck 10/26:  5x sit to stand:  12 sec                             TUG:  7 sec  04/21/22: 5 x sit to stand:  8 sec               TUG: 6.07 sec  2/1:  5x 8.91 sec         TUG  6.61 SLS right > 30 sec                 PELVIC ALIGNMENT:   LUMBARAROM/PROM   A/PROM A/PROM  eval 9/21 2/1  Extension Decreased by 25% WFLs Full in all planes    LOWER EXTREMITY LNL:GXQJJHERD hip ROM is full   LOWER EXTREMITY MMT:   MMT Right eval Left eval 9/21 10/31 04/21/22 2/1  Hip flexion 4/5 5/5     4- 4  Hip extension 4/5 4/5 4 right/left  4+/5 left/right    Hip abduction 3+/5 3+/5 4- right/left  4+/5 left/right 4+ 4+  Hip external rotation 4/5 4/'5     4 4  '$ Knee flexion 5/5 5/'5     5 5  '$ Knee extension 4/5 5/'5     5 5  '$ Ankle dorsiflexion 4/5 5/5 4+ right  4+/5 right 4+ 4+  Ankle plantarflexion 2/5 3/5     4 4+  Ankle inversion 5/5 5/'5     5 5  '$ Ankle eversion 5/5 5/'5     5 5     '$ PALPATION:   General  no tenderness                       TODAY'S TREATMENT  2/6: Nu-Step new model L5 x 5 min  Sit to stand x 10  Squat to table x 10 with 5 lb kb Lunges fwd x 10 each LE with 5 lb kb Lunges lateral x 10 each  LE with 5 lb Leg Press seat 5 x 15 with 85lbs Multi-hip extension and abduction 2 x 10 each with 40 lbs 20# kb deadlift from corner of the mat table (knee level) 2 x 10 Lateral band walks blue loop x 3 laps at  back counter 10# kettlebell below chin squats 10x (to simulate lifting 2 yr old grandchild) Standing lat bar # 2 x 10  2/1: Nu-Step new model L5 x 5 min (no left arm use today) 5x sit to stand SLS TUG 30# barbell deadlift from corner of the mat table (knee level) 10x 10# kettlebell below chin squats 10x (to simulate lifting 2 yr old grandchild) Holding 10# kettlebell step ups 10x right/left (to simulate holding infant grandchild) Holding pink light loop around wrists/palm with march 30 sec; reach toward floor 10x Purple loop X-heavy hip press forward 10x Standing lat bar 25# 10x Leg Press seat 6 (may try seat 5 next time) x 15 with 85lbs Therapeutic activities:  sit to stand, standing, walking, negotiating curbs, steps, lifting grandchildren, pushing, pulling  1/30 Nu-Step new model L5 x 7 min Squat to table holding 5# kettlebell under chin x 10 8# dumbbell to tall cone 10x;   2nd set 8# dumbbell to floor 10x 8# dumbell carry up and down steps 4x Alternating toe taps to BOSU holding 5# kettlebell for balance challenge Foot prop on BOSU with head turns and weight pass for balance challenge 8# step up up on 6 inch step 10x right/left Circles on floor quick step agility Leg Press x 25 with 75lbs Therapeutic activities:  sit to stand, standing, walking, negotiating curbs, steps, lifting, pushing, pulling       PATIENT EDUCATION:  Education details: education on vaginal moisturizers and lubricants. Handed patient samples of each.  Person educated: Patient Education method: Theatre stage manager Education comprehension: verbalized understanding     HOME EXERCISE PROGRAM: Access Code: [email or web address] URL: [email or web address] Date: 02/04/2022 Prepared by: Ruben Im   Exercises - Seated Hip External Rotation Stretch  - 1 x daily - 7 x weekly - 1 sets - 3 reps - 30 hold - Seated Slump Nerve Glide  - 1 x daily - 7 x weekly - 1 sets - 5 reps - Seated March  - 1 x  daily - 7 x weekly - 1 sets - 10 reps - Sit to Stand  - 1 x daily - 7 x weekly - 1 sets - 10 reps - Seated Hip Abduction with Resistance  - 1 x daily - 7 x weekly - 1 sets - 10 reps - Forward Step Up with Counter Support  - 1 x daily - 7 x weekly - 1 sets - 10 reps - Seated Hip Flexion March with Ankle Weights  - 1 x daily - 7 x weekly - 10 reps - Seated Knee Extension with Resistance  - 1 x daily - 7 x weekly - 1 sets - 10 reps Given red band for home   ASSESSMENT:   CLINICAL IMPRESSION: Cleone is progressing remarkably well despite going through cancer treatments.  She is able to do high level lifting, squatting and balance activities with ease. No shoulder pain mentioned today. She would benefit from continued skilled PT for strengthening, balance training and functional training.     OBJECTIVE IMPAIRMENTS Abnormal gait, decreased activity tolerance, decreased balance, decreased coordination, decreased endurance, difficulty walking, and decreased strength.    ACTIVITY LIMITATIONS lifting, bending, squatting, stairs, transfers, and locomotion level   PARTICIPATION LIMITATIONS:  cleaning, laundry, shopping, community activity, and occupation   PERSONAL FACTORS Age, Fitness, Past/current experiences, and 1-2 comorbidities: breast cancer and having chemotherapy  are also affecting patient's functional outcome.    REHAB POTENTIAL: Excellent   CLINICAL DECISION MAKING: Evolving/moderate complexity   EVALUATION COMPLEXITY: Moderate     GOALS: Goals reviewed with patient? Yes   SHORT TERM GOALS: Target date: 01/13/2022   Patient is independent with initial HEP for balance and strength.  Baseline: Goal status: goal met 10/26   2.  Patient reports her endurance and steadiness on her right leg has improved >/= 25% the 2 weeks prior to her chemotherapy treatments.  Baseline:  Goal status: met 10/26   3.  Patient educated on vaginal moisturizers and lubricants to promote good vaginal  health.  Baseline:  Goal status: MET 12/16/2021   LONG TERM GOALS: Target date: 05/26/2022  Patient independent with advanced HEP for balance and strength to reduce the chance of falls.  Baseline:  Goal status: INITIAL   2.  Sit to stand 5x is </= 12.6 seconds to reduce the chance of her falling.  Baseline:  Goal status: goal met 10/26   3.  Patient able to stand on right leg for 15 seconds so she is able to go up and down steps with step over step pattern.  Baseline: 04/21/22 8 sec left, 15 sec right Goal status: met 2/1   4.  Tandem stance with right leg for 30 seconds with </= 50% less body swaying due to improved strength and balance.  Baseline:  Goal status:MET   5.  Patient is able to squat and stand up with >/= 50% greater ease due to increased in right lower extremity strength.  Baseline:  Goal status: ongoing      PLAN: PT FREQUENCY: 2x/week   PT DURATION: 12 weeks   PLANNED INTERVENTIONS: Therapeutic exercises, Therapeutic activity, Neuromuscular re-education, Balance training, Gait training, Patient/Family education, Self Care, and Stair training   PLAN FOR NEXT SESSION:    resistance training;  continue 1-2  times per week x 5 weeks.  Patient will be out of town one of these weeks.  Therefore treatment weeks will be a total of 4 weeks.  Focus on leg strength, functional training and balance training;    Anderson Malta B. Tyrique Sporn, PT 05/18/22 4:09 PM  [facility] [address], [place], [place] [address] Phone # [phone] Fax [phone]

## 2022-05-19 ENCOUNTER — Ambulatory Visit: Payer: Medicare Other

## 2022-05-19 DIAGNOSIS — M6281 Muscle weakness (generalized): Secondary | ICD-10-CM | POA: Diagnosis not present

## 2022-05-19 DIAGNOSIS — R262 Difficulty in walking, not elsewhere classified: Secondary | ICD-10-CM | POA: Diagnosis not present

## 2022-05-19 DIAGNOSIS — R252 Cramp and spasm: Secondary | ICD-10-CM | POA: Diagnosis not present

## 2022-05-19 DIAGNOSIS — Z9181 History of falling: Secondary | ICD-10-CM

## 2022-05-19 NOTE — Therapy (Signed)
OUTPATIENT PHYSICAL THERAPY TREATMENT NOTE   Patient Name: Rhonda Richardson MRN: [id number] DOB:1940/07/13, 82 y.o., female Today's Date: 05/19/2022  PCP: Collene Leyden, MD REFERRING PROVIDER: Collene Leyden, MD   Progress Note Reporting Period 02/09/22 to 05/13/22  See note below for Objective Data and Assessment of Progress/Goals.       PT End of Session - 05/19/22 1620     Visit Number 22    Date for PT Re-Evaluation 05/26/22    Authorization Type Medicare    Progress Note Due on Visit 74    PT Start Time 1615    PT Stop Time 1655    PT Time Calculation (min) 40 min    Activity Tolerance Patient tolerated treatment well    Behavior During Therapy WFL for tasks assessed/performed             Past Medical History:  Diagnosis Date   Breast cancer ([place])    Hypertension    Past Surgical History:  Procedure Laterality Date   BREAST LUMPECTOMY WITH RADIOACTIVE SEED AND SENTINEL LYMPH NODE BIOPSY Right 09/15/2021   Procedure: RIGHT BREAST SEED BRACKETED LUMPECTOMY AND SENTINEL NODE BIOPSY;  Surgeon: [name], MD;  Location: [place];  Service: General;  Laterality: Right;   cataract surgery Bilateral    DILATION AND CURETTAGE OF UTERUS  1970   EXCISION OF BREAST LESION Right 10/15/2021   Procedure: ASPIRATION RIGHT AXILLARY SEROMA;  Surgeon: [name], MD;  Location: [place];  Service: General;  Laterality: Right;   eyelid tendon repair Bilateral    PORTACATH PLACEMENT N/A 09/15/2021   Procedure: PORT PLACEMENT;  Surgeon: [name], MD;  Location: [place];  Service: General;  Laterality: N/A;   RE-EXCISION OF BREAST LUMPECTOMY Right 10/15/2021   Procedure: RE-EXCISION LUMPECTOMY RIGHT BREAST;  Surgeon: [name], MD;  Location: [place];  Service: General;  Laterality: Right;   Patient Active Problem List   Diagnosis Date Noted   Port-A-Cath in place 11/18/2021   Genetic testing 09/28/2021   Malignant neoplasm of upper-outer  quadrant of right breast in female, estrogen receptor positive ([place]) 09/08/2021    REFERRING DIAG: right breast cancer at risk for lymphedema  THERAPY DIAG:  Muscle weakness (generalized)  History of falling  Difficulty in walking, not elsewhere classified  Cramp and spasm  PERTINENT HISTORY: right UE Lymphedema risk, None   PRECAUTIONS: right UE Lymphedema risk, None  SUBJECTIVE:   No new issues.  "Just feeling a little sluggish today"  PAIN:  Are you having pain? No  SOZO SCREENING: Patient was assessed today using the SOZO machine to determine the lymphedema index score. This was compared to her baseline score. It was determined that she is within the recommended range when compared to her baseline and no further action is needed at this time. She will continue SOZO screenings. These are done every 3 months for 2 years post operatively followed by every 6 months for 2 years, and then annually.   Ortho Re-assessment visit:   PRECAUTIONS: Other: Breast cancer   WEIGHT BEARING RESTRICTIONS No   FALLS:  Has patient fallen in last 6 months? Yes on 11/13/2021 she fell after the chemotherapy treatment due to sciatica.  LIVING ENVIRONMENT: Lives with: lives with their spouse   OCCUPATION: part time   PLOF: Independent   PATIENT GOALS pelvic floor education   PERTINENT HISTORY:  Patient was diagnosed on 08/12/2021 with right grade 2 invasive ductal carcinoma breast cancer. She had a  right lumpectomy and sentinel node biopsy (0/4 nodes positive) on 09/15/2021 followed by a re-excision on 10/15/2021. It is triple positive with a Ki67 of 30%. Presently having chemotherapy.    BOWEL MOVEMENT Pain with bowel movement: No   URINATION Pain with urination: No Fully empty bladder: No   INTERCOURSE Pain with intercourse:  none and uses lubricant   PREGNANCY Vaginal deliveries 3 Tearing No C-section deliveries 0 Currently pregnant No   OBJECTIVE:    DIAGNOSTIC FINDINGS:   none     COGNITION:            Overall cognitive status: Within functional limits for tasks assessed                          SENSATION:            Light touch: Appears intact on left, decreased on lateral and medial right lower leg            Proprioception: Appears intact                  POSTURE: No Significant postural limitations     Sit to stand 5 times 15 seconds.  TUG 11 sec Stand on right leg 3 sec Stand on left leg 15 sec with no issues Tandem stance with left leg in front 30 sec Tandem stance with right leg in front  30 sec with increased body sway     Recheck 10/26:  5x sit to stand:  12 sec                             TUG:  7 sec  04/21/22: 5 x sit to stand:  8 sec               TUG: 6.07 sec  2/1:  5x 8.91 sec         TUG  6.61 SLS right > 30 sec                 PELVIC ALIGNMENT:   LUMBARAROM/PROM   A/PROM A/PROM  eval 9/21 2/1  Extension Decreased by 25% WFLs Full in all planes    LOWER EXTREMITY [id number] hip ROM is full   LOWER EXTREMITY MMT:   MMT Right eval Left eval 9/21 10/31 04/21/22 2/1  Hip flexion 4/5 5/5     4- 4  Hip extension 4/5 4/5 4 right/left  4+/5 left/right    Hip abduction 3+/5 3+/5 4- right/left  4+/5 left/right 4+ 4+  Hip external rotation 4/5 4/'5     4 4  '$ Knee flexion 5/5 5/'5     5 5  '$ Knee extension 4/5 5/'5     5 5  '$ Ankle dorsiflexion 4/5 5/5 4+ right  4+/5 right 4+ 4+  Ankle plantarflexion 2/5 3/5     4 4+  Ankle inversion 5/5 5/'5     5 5  '$ Ankle eversion 5/5 5/'5     5 5     '$ PALPATION:   General  no tenderness                       TODAY'S TREATMENT  2/7: Nu-Step new model L5 x 5 min  Sit to stand x 10 with 10 lb kb Squat to table x 10 with 10 lb kb Lateral band walks blue loop x 3 laps at back counter Leg  Press seat 5, 2 x 10 with 85lbs Lunges fwd x 10 each LE with 10 lb kb Lunges lateral x 10 each LE with 10 lb 20# kb deadlift from foot stool (mid shin level)  2 x 10 Multi-hip extension and abduction  2 x 10 each with 40 lbs Standing lat bar 25# 2 x 10 Resisted walking fwd and reverse x 5 each with 15 lbs Resisted side stepping x 5 each side 15 lbs  2/6: Nu-Step new model L5 x 5 min  Sit to stand x 10  Squat to table x 10 with 5 lb kb Lunges fwd x 10 each LE with 5 lb kb Lunges lateral x 10 each LE with 5 lb Leg Press seat 5 x 15 with 85lbs Multi-hip extension and abduction 2 x 10 each with 40 lbs 20# kb deadlift from corner of the mat table (knee level) 2 x 10 Lateral band walks blue loop x 3 laps at back counter 10# kettlebell below chin squats 10x (to simulate lifting 2 yr old grandchild) Standing lat bar # 2 x 10  2/1: Nu-Step new model L5 x 5 min (no left arm use today) 5x sit to stand SLS TUG 30# barbell deadlift from corner of the mat table (knee level) 10x 10# kettlebell below chin squats 10x (to simulate lifting 2 yr old grandchild) Holding 10# kettlebell step ups 10x right/left (to simulate holding infant grandchild) Holding pink light loop around wrists/palm with march 30 sec; reach toward floor 10x Purple loop X-heavy hip press forward 10x Standing lat bar 25# 10x Leg Press seat 6 (may try seat 5 next time) x 15 with 85lbs Therapeutic activities:  sit to stand, standing, walking, negotiating curbs, steps, lifting grandchildren, pushing, pulling  PATIENT EDUCATION:  Education details: education on vaginal moisturizers and lubricants. Handed patient samples of each.  Person educated: Patient Education method: Theatre stage manager Education comprehension: verbalized understanding     HOME EXERCISE PROGRAM: Access Code: [email or web address] URL: [email or web address] Date: 02/04/2022 Prepared by: Ruben Im   Exercises - Seated Hip External Rotation Stretch  - 1 x daily - 7 x weekly - 1 sets - 3 reps - 30 hold - Seated Slump Nerve Glide  - 1 x daily - 7 x weekly - 1 sets - 5 reps - Seated March  - 1 x daily - 7 x weekly - 1 sets - 10 reps - Sit to  Stand  - 1 x daily - 7 x weekly - 1 sets - 10 reps - Seated Hip Abduction with Resistance  - 1 x daily - 7 x weekly - 1 sets - 10 reps - Forward Step Up with Counter Support  - 1 x daily - 7 x weekly - 1 sets - 10 reps - Seated Hip Flexion March with Ankle Weights  - 1 x daily - 7 x weekly - 10 reps - Seated Knee Extension with Resistance  - 1 x daily - 7 x weekly - 1 sets - 10 reps Given red band for home   ASSESSMENT:   CLINICAL IMPRESSION: Nesreen is progressing appropriately.  She has one more week of PT and should be ready for DC.   She would benefit from her last 2 visits of skilled PT for strengthening, balance training and functional training.     OBJECTIVE IMPAIRMENTS Abnormal gait, decreased activity tolerance, decreased balance, decreased coordination, decreased endurance, difficulty walking, and decreased strength.    ACTIVITY LIMITATIONS lifting, bending, squatting, stairs, transfers, and locomotion  level   PARTICIPATION LIMITATIONS: cleaning, laundry, shopping, community activity, and occupation   PERSONAL FACTORS Age, Fitness, Past/current experiences, and 1-2 comorbidities: breast cancer and having chemotherapy  are also affecting patient's functional outcome.    REHAB POTENTIAL: Excellent   CLINICAL DECISION MAKING: Evolving/moderate complexity   EVALUATION COMPLEXITY: Moderate     GOALS: Goals reviewed with patient? Yes   SHORT TERM GOALS: Target date: 01/13/2022   Patient is independent with initial HEP for balance and strength.  Baseline: Goal status: goal met 10/26   2.  Patient reports her endurance and steadiness on her right leg has improved >/= 25% the 2 weeks prior to her chemotherapy treatments.  Baseline:  Goal status: met 10/26   3.  Patient educated on vaginal moisturizers and lubricants to promote good vaginal health.  Baseline:  Goal status: MET 12/16/2021   LONG TERM GOALS: Target date: 05/26/2022  Patient independent with advanced HEP for  balance and strength to reduce the chance of falls.  Baseline:  Goal status: INITIAL   2.  Sit to stand 5x is </= 12.6 seconds to reduce the chance of her falling.  Baseline:  Goal status: goal met 10/26   3.  Patient able to stand on right leg for 15 seconds so she is able to go up and down steps with step over step pattern.  Baseline: 04/21/22 8 sec left, 15 sec right Goal status: met 2/1   4.  Tandem stance with right leg for 30 seconds with </= 50% less body swaying due to improved strength and balance.  Baseline:  Goal status:MET   5.  Patient is able to squat and stand up with >/= 50% greater ease due to increased in right lower extremity strength.  Baseline:  Goal status: ongoing      PLAN: PT FREQUENCY: 2x/week   PT DURATION: 12 weeks   PLANNED INTERVENTIONS: Therapeutic exercises, Therapeutic activity, Neuromuscular re-education, Balance training, Gait training, Patient/Family education, Self Care, and Stair training   PLAN FOR NEXT SESSION:    resistance training;  continue 1-2  times per week x 5 weeks.  Patient will be out of town one of these weeks.  Therefore treatment weeks will be a total of 4 weeks.  Focus on leg strength, functional training and balance training;    Anderson Malta B. Syria Kestner, PT 05/19/22 5:07 PM  [facility] [address] [place], [place] [address] Phone # [phone] Fax [phone]

## 2022-05-25 ENCOUNTER — Ambulatory Visit: Payer: Medicare Other | Attending: General Surgery

## 2022-05-25 DIAGNOSIS — R252 Cramp and spasm: Secondary | ICD-10-CM | POA: Diagnosis not present

## 2022-05-25 DIAGNOSIS — M6281 Muscle weakness (generalized): Secondary | ICD-10-CM | POA: Diagnosis not present

## 2022-05-25 DIAGNOSIS — Z9181 History of falling: Secondary | ICD-10-CM | POA: Diagnosis not present

## 2022-05-25 DIAGNOSIS — R262 Difficulty in walking, not elsewhere classified: Secondary | ICD-10-CM | POA: Insufficient documentation

## 2022-05-25 NOTE — Therapy (Signed)
OUTPATIENT PHYSICAL THERAPY TREATMENT NOTE   Patient Name: Rhonda Richardson MRN: [id number] DOB:12-Feb-1941, 82 y.o., female Today's Date: 05/25/2022  PCP: Collene Leyden, MD REFERRING PROVIDER: Benay Pike, MD   Progress Note Reporting Period 02/09/22 to 05/13/22  See note below for Objective Data and Assessment of Progress/Goals.       PT End of Session - 05/25/22 1116     Visit Number 23    Date for PT Re-Evaluation 05/26/22    Authorization Type Medicare    PT Start Time 1100    PT Stop Time 1145    PT Time Calculation (min) 45 min    Activity Tolerance Patient tolerated treatment well    Behavior During Therapy WFL for tasks assessed/performed             Past Medical History:  Diagnosis Date   Breast cancer ([place])    Hypertension    Past Surgical History:  Procedure Laterality Date   BREAST LUMPECTOMY WITH RADIOACTIVE SEED AND SENTINEL LYMPH NODE BIOPSY Right 09/15/2021   Procedure: RIGHT BREAST SEED BRACKETED LUMPECTOMY AND SENTINEL NODE BIOPSY;  Surgeon: [name], MD;  Location: [place];  Service: General;  Laterality: Right;   cataract surgery Bilateral    DILATION AND CURETTAGE OF UTERUS  1970   EXCISION OF BREAST LESION Right 10/15/2021   Procedure: ASPIRATION RIGHT AXILLARY SEROMA;  Surgeon: [name], MD;  Location: [place];  Service: General;  Laterality: Right;   eyelid tendon repair Bilateral    PORTACATH PLACEMENT N/A 09/15/2021   Procedure: PORT PLACEMENT;  Surgeon: [name], MD;  Location: [place];  Service: General;  Laterality: N/A;   RE-EXCISION OF BREAST LUMPECTOMY Right 10/15/2021   Procedure: RE-EXCISION LUMPECTOMY RIGHT BREAST;  Surgeon: [name], MD;  Location: [place];  Service: General;  Laterality: Right;   Patient Active Problem List   Diagnosis Date Noted   Port-A-Cath in place 11/18/2021   Genetic testing 09/28/2021   Malignant neoplasm of upper-outer quadrant of right breast in female,  estrogen receptor positive ([place]) 09/08/2021    REFERRING DIAG: right breast cancer at risk for lymphedema  THERAPY DIAG:  Muscle weakness (generalized)  History of falling  Difficulty in walking, not elsewhere classified  Cramp and spasm  PERTINENT HISTORY: right UE Lymphedema risk, None   PRECAUTIONS: right UE Lymphedema risk, None  SUBJECTIVE:   No new issues.    PAIN:  Are you having pain? No  SOZO SCREENING: Patient was assessed today using the SOZO machine to determine the lymphedema index score. This was compared to her baseline score. It was determined that she is within the recommended range when compared to her baseline and no further action is needed at this time. She will continue SOZO screenings. These are done every 3 months for 2 years post operatively followed by every 6 months for 2 years, and then annually.   Ortho Re-assessment visit:   PRECAUTIONS: Other: Breast cancer   WEIGHT BEARING RESTRICTIONS No   FALLS:  Has patient fallen in last 6 months? Yes on 11/13/2021 she fell after the chemotherapy treatment due to sciatica.  LIVING ENVIRONMENT: Lives with: lives with their spouse   OCCUPATION: part time   PLOF: Independent   PATIENT GOALS pelvic floor education   PERTINENT HISTORY:  Patient was diagnosed on 08/12/2021 with right grade 2 invasive ductal carcinoma breast cancer. She had a right lumpectomy and sentinel node biopsy (0/4 nodes positive) on 09/15/2021 followed by a  re-excision on 10/15/2021. It is triple positive with a Ki67 of 30%. Presently having chemotherapy.    BOWEL MOVEMENT Pain with bowel movement: No   URINATION Pain with urination: No Fully empty bladder: No   INTERCOURSE Pain with intercourse:  none and uses lubricant   PREGNANCY Vaginal deliveries 3 Tearing No C-section deliveries 0 Currently pregnant No   OBJECTIVE:    DIAGNOSTIC FINDINGS:  none     COGNITION:            Overall cognitive status: Within functional  limits for tasks assessed                          SENSATION:            Light touch: Appears intact on left, decreased on lateral and medial right lower leg            Proprioception: Appears intact                  POSTURE: No Significant postural limitations     Sit to stand 5 times 15 seconds.  TUG 11 sec Stand on right leg 3 sec Stand on left leg 15 sec with no issues Tandem stance with left leg in front 30 sec Tandem stance with right leg in front  30 sec with increased body sway     Recheck 10/26:  5x sit to stand:  12 sec                             TUG:  7 sec  04/21/22: 5 x sit to stand:  8 sec               TUG: 6.07 sec  2/1:  5x 8.91 sec         TUG  6.61 SLS right > 30 sec                 PELVIC ALIGNMENT:   LUMBARAROM/PROM   A/PROM A/PROM  eval 9/21 2/1  Extension Decreased by 25% WFLs Full in all planes    LOWER EXTREMITY [id number] hip ROM is full   LOWER EXTREMITY MMT:   MMT Right eval Left eval 9/21 10/31 04/21/22 2/1  Hip flexion 4/5 5/5     4- 4  Hip extension 4/5 4/5 4 right/left  4+/5 left/right    Hip abduction 3+/5 3+/5 4- right/left  4+/5 left/right 4+ 4+  Hip external rotation 4/5 4/5     4 4  $ Knee flexion 5/5 5/5     5 5  $ Knee extension 4/5 5/5     5 5  $ Ankle dorsiflexion 4/5 5/5 4+ right  4+/5 right 4+ 4+  Ankle plantarflexion 2/5 3/5     4 4+  Ankle inversion 5/5 5/5     5 5  $ Ankle eversion 5/5 5/5     5 5     $ PALPATION:   General  no tenderness                       TODAY'S TREATMENT  2/13: Nu-Step new model L5 x 5 min  Sit to stand x 10 with 10 lb kb Squat to table x 10 with 10 lb kb Seated push ups with yellow handles 2 x 10 1/4 planks using yellow handles 3 x 30 sec Lateral band walks blue loop x 3 laps  at back counter Alternating toe taps x 20 using yellow handles/hurdles Stepping over hurdles fwd and lateral (yellow handles/hurdles) Lunge to BOSU fwd and lateral x 10 each leg in each direction. Leg Press seat  5, 3 x 10 with 90 lbs 20# kb deadlift from foot stool (mid shin level)  2 x 10 Multi-hip extension and abduction 2 x 10 each with 45 lbs Resisted walking fwd x 5 with 15 lbs Resisted side stepping x 5 each side 15 lbs  2/7: Nu-Step new model L5 x 5 min  Sit to stand x 10 with 10 lb kb Squat to table x 10 with 10 lb kb Lateral band walks blue loop x 3 laps at back counter Leg Press seat 5, 2 x 10 with 85lbs Lunges fwd x 10 each LE with 10 lb kb Lunges lateral x 10 each LE with 10 lb 20# kb deadlift from foot stool (mid shin level)  2 x 10 Multi-hip extension and abduction 2 x 10 each with 40 lbs Standing lat bar 25# 2 x 10 Resisted walking fwd and reverse x 5 each with 15 lbs Resisted side stepping x 5 each side 15 lbs  2/6: Nu-Step new model L5 x 5 min  Sit to stand x 10  Squat to table x 10 with 5 lb kb Lunges fwd x 10 each LE with 5 lb kb Lunges lateral x 10 each LE with 5 lb Leg Press seat 5 x 15 with 85lbs Multi-hip extension and abduction 2 x 10 each with 40 lbs 20# kb deadlift from corner of the mat table (knee level) 2 x 10 Lateral band walks blue loop x 3 laps at back counter 10# kettlebell below chin squats 10x (to simulate lifting 2 yr old grandchild) Standing lat bar # 2 x 10   PATIENT EDUCATION:  Education details: education on vaginal moisturizers and lubricants. Handed patient samples of each.  Person educated: Patient Education method: Theatre stage manager Education comprehension: verbalized understanding     HOME EXERCISE PROGRAM: Access Code: [email or web address] URL: [email or web address] Date: 02/04/2022 Prepared by: Ruben Im   Exercises - Seated Hip External Rotation Stretch  - 1 x daily - 7 x weekly - 1 sets - 3 reps - 30 hold - Seated Slump Nerve Glide  - 1 x daily - 7 x weekly - 1 sets - 5 reps - Seated March  - 1 x daily - 7 x weekly - 1 sets - 10 reps - Sit to Stand  - 1 x daily - 7 x weekly - 1 sets - 10 reps - Seated Hip  Abduction with Resistance  - 1 x daily - 7 x weekly - 1 sets - 10 reps - Forward Step Up with Counter Support  - 1 x daily - 7 x weekly - 1 sets - 10 reps - Seated Hip Flexion March with Ankle Weights  - 1 x daily - 7 x weekly - 10 reps - Seated Knee Extension with Resistance  - 1 x daily - 7 x weekly - 1 sets - 10 reps Given red band for home   ASSESSMENT:   CLINICAL IMPRESSION: [facility] is meeting all goals and will be ready for DC tomorrow.     OBJECTIVE IMPAIRMENTS Abnormal gait, decreased activity tolerance, decreased balance, decreased coordination, decreased endurance, difficulty walking, and decreased strength.    ACTIVITY LIMITATIONS lifting, bending, squatting, stairs, transfers, and locomotion level   PARTICIPATION LIMITATIONS: cleaning, laundry, shopping, community activity, and occupation  PERSONAL FACTORS Age, Fitness, Past/current experiences, and 1-2 comorbidities: breast cancer and having chemotherapy  are also affecting patient's functional outcome.    REHAB POTENTIAL: Excellent   CLINICAL DECISION MAKING: Evolving/moderate complexity   EVALUATION COMPLEXITY: Moderate     GOALS: Goals reviewed with patient? Yes   SHORT TERM GOALS: Target date: 01/13/2022   Patient is independent with initial HEP for balance and strength.  Baseline: Goal status: goal met 10/26   2.  Patient reports her endurance and steadiness on her right leg has improved >/= 25% the 2 weeks prior to her chemotherapy treatments.  Baseline:  Goal status: met 10/26   3.  Patient educated on vaginal moisturizers and lubricants to promote good vaginal health.  Baseline:  Goal status: MET 12/16/2021   LONG TERM GOALS: Target date: 05/26/2022  Patient independent with advanced HEP for balance and strength to reduce the chance of falls.  Baseline:  Goal status: INITIAL   2.  Sit to stand 5x is </= 12.6 seconds to reduce the chance of her falling.  Baseline:  Goal status: goal met 10/26   3.   Patient able to stand on right leg for 15 seconds so she is able to go up and down steps with step over step pattern.  Baseline: 04/21/22 8 sec left, 15 sec right Goal status: met 2/1   4.  Tandem stance with right leg for 30 seconds with </= 50% less body swaying due to improved strength and balance.  Baseline:  Goal status:MET   5.  Patient is able to squat and stand up with >/= 50% greater ease due to increased in right lower extremity strength.  Baseline:  Goal status: ongoing      PLAN: PT FREQUENCY: 2x/week   PT DURATION: 12 weeks   PLANNED INTERVENTIONS: Therapeutic exercises, Therapeutic activity, Neuromuscular re-education, Balance training, Gait training, Patient/Family education, Self Care, and Stair training   PLAN FOR NEXT SESSION:    DC next visit.    Anderson Malta B. Ermina Oberman, PT 05/25/22 11:49 AM  [facility] [address] [place], [place] [address] Phone # [phone] Fax [phone]

## 2022-05-26 ENCOUNTER — Ambulatory Visit: Payer: Medicare Other

## 2022-05-26 DIAGNOSIS — R262 Difficulty in walking, not elsewhere classified: Secondary | ICD-10-CM

## 2022-05-26 DIAGNOSIS — Z9181 History of falling: Secondary | ICD-10-CM | POA: Diagnosis not present

## 2022-05-26 DIAGNOSIS — M6281 Muscle weakness (generalized): Secondary | ICD-10-CM | POA: Diagnosis not present

## 2022-05-26 DIAGNOSIS — R252 Cramp and spasm: Secondary | ICD-10-CM | POA: Diagnosis not present

## 2022-05-26 NOTE — Therapy (Signed)
OUTPATIENT PHYSICAL THERAPY TREATMENT NOTE PHYSICAL THERAPY DISCHARGE SUMMARY  Visits from Start of Care: 24  Current functional level related to goals / functional outcomes: See below   Remaining deficits: See below   Education / Equipment: See below   Patient agrees to discharge. Patient goals were met. Patient is being discharged due to meeting the stated rehab goals.    Patient Name: Rhonda Richardson MRN: [id number] DOB:Oct 01, 1940, 82 y.o., female Today's Date: 05/26/2022  PCP: Collene Leyden, MD REFERRING PROVIDER: Collene Leyden, MD   Progress Note Reporting Period 02/09/22 to 05/13/22  See note below for Objective Data and Assessment of Progress/Goals.       PT End of Session - 05/26/22 1048     Visit Number 24    Date for PT Re-Evaluation 05/26/22    Authorization Type Medicare    Progress Note Due on Visit 44    PT Start Time 1022    PT Stop Time 1100    PT Time Calculation (min) 38 min    Activity Tolerance Patient tolerated treatment well    Behavior During Therapy WFL for tasks assessed/performed             Past Medical History:  Diagnosis Date   Breast cancer ([facility])    Hypertension    Past Surgical History:  Procedure Laterality Date   BREAST LUMPECTOMY WITH RADIOACTIVE SEED AND SENTINEL LYMPH NODE BIOPSY Right 09/15/2021   Procedure: RIGHT BREAST SEED BRACKETED LUMPECTOMY AND SENTINEL NODE BIOPSY;  Surgeon: [name], MD;  Location: [facility];  Service: General;  Laterality: Right;   cataract surgery Bilateral    DILATION AND CURETTAGE OF UTERUS  1970   EXCISION OF BREAST LESION Right 10/15/2021   Procedure: ASPIRATION RIGHT AXILLARY SEROMA;  Surgeon: [name], MD;  Location: [facility];  Service: General;  Laterality: Right;   eyelid tendon repair Bilateral    PORTACATH PLACEMENT N/A 09/15/2021   Procedure: PORT PLACEMENT;  Surgeon: [name], MD;  Location: [facility];  Service: General;  Laterality: N/A;    RE-EXCISION OF BREAST LUMPECTOMY Right 10/15/2021   Procedure: RE-EXCISION LUMPECTOMY RIGHT BREAST;  Surgeon: [name], MD;  Location: [facility];  Service: General;  Laterality: Right;   Patient Active Problem List   Diagnosis Date Noted   Port-A-Cath in place 11/18/2021   Genetic testing 09/28/2021   Malignant neoplasm of upper-outer quadrant of right breast in female, estrogen receptor positive ([place]) 09/08/2021    REFERRING DIAG: right breast cancer at risk for lymphedema  THERAPY DIAG:  Muscle weakness (generalized)  History of falling  Difficulty in walking, not elsewhere classified  Cramp and spasm  PERTINENT HISTORY: right UE Lymphedema risk, None   PRECAUTIONS: right UE Lymphedema risk, None  SUBJECTIVE:   No new issues.    PAIN:  Are you having pain? No  SOZO SCREENING: Patient was assessed today using the SOZO machine to determine the lymphedema index score. This was compared to her baseline score. It was determined that she is within the recommended range when compared to her baseline and no further action is needed at this time. She will continue SOZO screenings. These are done every 3 months for 2 years post operatively followed by every 6 months for 2 years, and then annually.   Ortho Re-assessment visit:   PRECAUTIONS: Other: Breast cancer   WEIGHT BEARING RESTRICTIONS No   FALLS:  Has patient fallen in last 6 months? Yes on 11/13/2021 she fell after  the chemotherapy treatment due to sciatica.  LIVING ENVIRONMENT: Lives with: lives with their spouse   OCCUPATION: part time   PLOF: Independent   PATIENT GOALS pelvic floor education   PERTINENT HISTORY:  Patient was diagnosed on 08/12/2021 with right grade 2 invasive ductal carcinoma breast cancer. She had a right lumpectomy and sentinel node biopsy (0/4 nodes positive) on 09/15/2021 followed by a re-excision on 10/15/2021. It is triple positive with a Ki67 of 30%. Presently having chemotherapy.    BOWEL  MOVEMENT Pain with bowel movement: No   URINATION Pain with urination: No Fully empty bladder: No   INTERCOURSE Pain with intercourse:  none and uses lubricant   PREGNANCY Vaginal deliveries 3 Tearing No C-section deliveries 0 Currently pregnant No   OBJECTIVE:    DIAGNOSTIC FINDINGS:  none     COGNITION:            Overall cognitive status: Within functional limits for tasks assessed                          SENSATION:            Light touch: Appears intact on left, decreased on lateral and medial right lower leg            Proprioception: Appears intact                  POSTURE: No Significant postural limitations     Sit to stand 5 times 15 seconds.  TUG 11 sec Stand on right leg 3 sec Stand on left leg 15 sec with no issues Tandem stance with left leg in front 30 sec Tandem stance with right leg in front  30 sec with increased body sway     Recheck 10/26:  5x sit to stand:  12 sec                             TUG:  7 sec  04/21/22: 5 x sit to stand:  8 sec               TUG: 6.07 sec  2/1:  5x 8.91 sec         TUG  6.61 SLS right > 30 sec  2/14:  5x  sit to stand:  7.10 sec         TUG  5.83 SLS bilaterally > 30 sec                 PELVIC ALIGNMENT:   LUMBARAROM/PROM   A/PROM A/PROM  eval 9/21 2/1  Extension Decreased by 25% WFLs Full in all planes    LOWER EXTREMITY [id number] hip ROM is full   LOWER EXTREMITY MMT:   MMT Right eval Left eval 9/21 10/31 04/21/22 2/1  Hip flexion 4/5 5/5     4- 4  Hip extension 4/5 4/5 4 right/left  4+/5 left/right    Hip abduction 3+/5 3+/5 4- right/left  4+/5 left/right 4+ 4+  Hip external rotation 4/5 4/5     4 4  $ Knee flexion 5/5 5/5     5 5  $ Knee extension 4/5 5/5     5 5  $ Ankle dorsiflexion 4/5 5/5 4+ right  4+/5 right 4+ 4+  Ankle plantarflexion 2/5 3/5     4 4+  Ankle inversion 5/5 5/5     5 5  $ Ankle eversion 5/5 5/5  5 5     PALPATION:   General  no tenderness                        TODAY'S TREATMENT  2/14: DC assessment completed Reviewed and updated HEP Discussed DC plan and educated patient on how to progress   2/13: Nu-Step new model L5 x 5 min  Sit to stand x 10 with 10 lb kb Squat to table x 10 with 10 lb kb Seated push ups with yellow handles 2 x 10 1/4 planks using yellow handles 3 x 30 sec Lateral band walks blue loop x 3 laps at back counter Alternating toe taps x 20 using yellow handles/hurdles Stepping over hurdles fwd and lateral (yellow handles/hurdles) Lunge to BOSU fwd and lateral x 10 each leg in each direction. Leg Press seat 5, 3 x 10 with 90 lbs 20# kb deadlift from foot stool (mid shin level)  2 x 10 Multi-hip extension and abduction 2 x 10 each with 45 lbs Resisted walking fwd x 5 with 15 lbs Resisted side stepping x 5 each side 15 lbs  2/7: Nu-Step new model L5 x 5 min  Sit to stand x 10 with 10 lb kb Squat to table x 10 with 10 lb kb Lateral band walks blue loop x 3 laps at back counter Leg Press seat 5, 2 x 10 with 85lbs Lunges fwd x 10 each LE with 10 lb kb Lunges lateral x 10 each LE with 10 lb 20# kb deadlift from foot stool (mid shin level)  2 x 10 Multi-hip extension and abduction 2 x 10 each with 40 lbs Standing lat bar 25# 2 x 10 Resisted walking fwd and reverse x 5 each with 15 lbs Resisted side stepping x 5 each side 15 lbs   PATIENT EDUCATION:  Education details: education on vaginal moisturizers and lubricants. Handed patient samples of each.  Person educated: Patient Education method: Theatre stage manager Education comprehension: verbalized understanding     HOME EXERCISE PROGRAM: Access Code: [email or web address] URL: [email or web address] Date: 02/04/2022 Prepared by: Ruben Im   Exercises - Seated Hip External Rotation Stretch  - 1 x daily - 7 x weekly - 1 sets - 3 reps - 30 hold - Seated Slump Nerve Glide  - 1 x daily - 7 x weekly - 1 sets - 5 reps - Seated March  - 1 x daily - 7 x weekly -  1 sets - 10 reps - Sit to Stand  - 1 x daily - 7 x weekly - 1 sets - 10 reps - Seated Hip Abduction with Resistance  - 1 x daily - 7 x weekly - 1 sets - 10 reps - Forward Step Up with Counter Support  - 1 x daily - 7 x weekly - 1 sets - 10 reps - Seated Hip Flexion March with Ankle Weights  - 1 x daily - 7 x weekly - 10 reps - Seated Knee Extension with Resistance  - 1 x daily - 7 x weekly - 1 sets - 10 reps Given red band for home   ASSESSMENT:   CLINICAL IMPRESSION: Romell met all goals.  She is well motivated and compliant and should continue to do very well.      OBJECTIVE IMPAIRMENTS Abnormal gait, decreased activity tolerance, decreased balance, decreased coordination, decreased endurance, difficulty walking, and decreased strength.    ACTIVITY LIMITATIONS lifting, bending, squatting, stairs, transfers, and locomotion level   PARTICIPATION  LIMITATIONS: cleaning, laundry, shopping, community activity, and occupation   PERSONAL FACTORS Age, Fitness, Past/current experiences, and 1-2 comorbidities: breast cancer and having chemotherapy  are also affecting patient's functional outcome.    REHAB POTENTIAL: Excellent   CLINICAL DECISION MAKING: Evolving/moderate complexity   EVALUATION COMPLEXITY: Moderate     GOALS: Goals reviewed with patient? Yes   SHORT TERM GOALS: Target date: 01/13/2022   Patient is independent with initial HEP for balance and strength.  Baseline: Goal status: goal met 10/26   2.  Patient reports her endurance and steadiness on her right leg has improved >/= 25% the 2 weeks prior to her chemotherapy treatments.  Baseline:  Goal status: met 10/26   3.  Patient educated on vaginal moisturizers and lubricants to promote good vaginal health.  Baseline:  Goal status: MET 12/16/2021   LONG TERM GOALS: Target date: 05/26/2022  Patient independent with advanced HEP for balance and strength to reduce the chance of falls.  Baseline:  Goal status: MET   2.   Sit to stand 5x is </= 12.6 seconds to reduce the chance of her falling.  Baseline:  Goal status: goal met 10/26   3.  Patient able to stand on right leg for 15 seconds so she is able to go up and down steps with step over step pattern.  Baseline: 04/21/22 8 sec left, 15 sec right Goal status: met 2/1   4.  Tandem stance with right leg for 30 seconds with </= 50% less body swaying due to improved strength and balance.  Baseline:  Goal status:MET   5.  Patient is able to squat and stand up with >/= 50% greater ease due to increased in right lower extremity strength.  Baseline:  Goal status: MET     PLAN: PT FREQUENCY: 2x/week   PT DURATION: 12 weeks   PLANNED INTERVENTIONS: Therapeutic exercises, Therapeutic activity, Neuromuscular re-education, Balance training, Gait training, Patient/Family education, Self Care, and Stair training   PLAN FOR NEXT SESSION:    We will DC at this time.     Anderson Malta B. Chayla Shands, PT 05/26/22 11:39 PM  [facility] [address], [place], [place] [address] Phone # [phone] Fax [phone]

## 2022-05-27 DIAGNOSIS — R300 Dysuria: Secondary | ICD-10-CM | POA: Diagnosis not present

## 2022-06-01 DIAGNOSIS — R102 Pelvic and perineal pain: Secondary | ICD-10-CM | POA: Diagnosis not present

## 2022-06-01 DIAGNOSIS — N819 Female genital prolapse, unspecified: Secondary | ICD-10-CM | POA: Diagnosis not present

## 2022-06-07 ENCOUNTER — Inpatient Hospital Stay: Payer: Medicare Other

## 2022-06-07 ENCOUNTER — Inpatient Hospital Stay (HOSPITAL_BASED_OUTPATIENT_CLINIC_OR_DEPARTMENT_OTHER): Payer: Medicare Other | Admitting: Hematology and Oncology

## 2022-06-07 ENCOUNTER — Encounter: Payer: Self-pay | Admitting: Hematology and Oncology

## 2022-06-07 DIAGNOSIS — Z95828 Presence of other vascular implants and grafts: Secondary | ICD-10-CM

## 2022-06-07 DIAGNOSIS — Z170 Estrogen receptor positive status [ER+]: Secondary | ICD-10-CM | POA: Diagnosis not present

## 2022-06-07 DIAGNOSIS — C50411 Malignant neoplasm of upper-outer quadrant of right female breast: Secondary | ICD-10-CM | POA: Diagnosis not present

## 2022-06-07 DIAGNOSIS — K5900 Constipation, unspecified: Secondary | ICD-10-CM | POA: Diagnosis not present

## 2022-06-07 DIAGNOSIS — Z5112 Encounter for antineoplastic immunotherapy: Secondary | ICD-10-CM | POA: Diagnosis not present

## 2022-06-07 DIAGNOSIS — D649 Anemia, unspecified: Secondary | ICD-10-CM | POA: Diagnosis not present

## 2022-06-07 DIAGNOSIS — Z79811 Long term (current) use of aromatase inhibitors: Secondary | ICD-10-CM | POA: Diagnosis not present

## 2022-06-07 LAB — CBC WITH DIFFERENTIAL (CANCER CENTER ONLY)
Abs Immature Granulocytes: 0.02 10*3/uL (ref 0.00–0.07)
Basophils Absolute: 0.1 10*3/uL (ref 0.0–0.1)
Basophils Relative: 1 %
Eosinophils Absolute: 0.2 10*3/uL (ref 0.0–0.5)
Eosinophils Relative: 3 %
HCT: 32.6 % — ABNORMAL LOW (ref 36.0–46.0)
Hemoglobin: 10.6 g/dL — ABNORMAL LOW (ref 12.0–15.0)
Immature Granulocytes: 0 %
Lymphocytes Relative: 22 %
Lymphs Abs: 1.2 10*3/uL (ref 0.7–4.0)
MCH: 27.2 pg (ref 26.0–34.0)
MCHC: 32.5 g/dL (ref 30.0–36.0)
MCV: 83.6 fL (ref 80.0–100.0)
Monocytes Absolute: 0.4 10*3/uL (ref 0.1–1.0)
Monocytes Relative: 7 %
Neutro Abs: 3.4 10*3/uL (ref 1.7–7.7)
Neutrophils Relative %: 67 %
Platelet Count: 198 10*3/uL (ref 150–400)
RBC: 3.9 MIL/uL (ref 3.87–5.11)
RDW: 13.6 % (ref 11.5–15.5)
WBC Count: 5.3 10*3/uL (ref 4.0–10.5)
nRBC: 0 % (ref 0.0–0.2)

## 2022-06-07 LAB — IRON AND IRON BINDING CAPACITY (CC-WL,HP ONLY)
Iron: 53 ug/dL (ref 28–170)
Saturation Ratios: 14 % (ref 10.4–31.8)
TIBC: 370 ug/dL (ref 250–450)
UIBC: 317 ug/dL (ref 148–442)

## 2022-06-07 LAB — RETICULOCYTES
Immature Retic Fract: 4.5 % (ref 2.3–15.9)
RBC.: 4.03 MIL/uL (ref 3.87–5.11)
Retic Count, Absolute: 39.9 10*3/uL (ref 19.0–186.0)
Retic Ct Pct: 1 % (ref 0.4–3.1)

## 2022-06-07 LAB — CMP (CANCER CENTER ONLY)
ALT: 25 U/L (ref 0–44)
AST: 21 U/L (ref 15–41)
Albumin: 3.8 g/dL (ref 3.5–5.0)
Alkaline Phosphatase: 65 U/L (ref 38–126)
Anion gap: 5 (ref 5–15)
BUN: 26 mg/dL — ABNORMAL HIGH (ref 8–23)
CO2: 29 mmol/L (ref 22–32)
Calcium: 8.8 mg/dL — ABNORMAL LOW (ref 8.9–10.3)
Chloride: 105 mmol/L (ref 98–111)
Creatinine: 0.78 mg/dL (ref 0.44–1.00)
GFR, Estimated: >60 mL/min (ref >60)
Glucose, Bld: 79 mg/dL (ref 70–99)
Potassium: 3.9 mmol/L (ref 3.5–5.1)
Sodium: 139 mmol/L (ref 135–145)
Total Bilirubin: 0.3 mg/dL (ref 0.3–1.2)
Total Protein: 6.5 g/dL (ref 6.5–8.1)

## 2022-06-07 LAB — FERRITIN: Ferritin: 109 ng/mL (ref 11–307)

## 2022-06-07 LAB — LACTATE DEHYDROGENASE: LDH: 155 U/L (ref 98–192)

## 2022-06-07 LAB — VITAMIN B12: Vitamin B-12: 680 pg/mL (ref 180–914)

## 2022-06-07 MED ORDER — SODIUM CHLORIDE 0.9% FLUSH
10.0000 mL | Freq: Once | INTRAVENOUS | Status: AC
  Administered 2022-06-07: 10 mL

## 2022-06-07 MED ORDER — ACETAMINOPHEN 325 MG PO TABS
650.0000 mg | ORAL_TABLET | Freq: Once | ORAL | Status: AC
  Administered 2022-06-07: 650 mg via ORAL
  Filled 2022-06-07: qty 2

## 2022-06-07 MED ORDER — TRASTUZUMAB-HYALURONIDASE-OYSK 600-10000 MG-UNT/5ML ~~LOC~~ SOLN
600.0000 mg | Freq: Once | SUBCUTANEOUS | Status: AC
  Administered 2022-06-07: 600 mg via SUBCUTANEOUS
  Filled 2022-06-07: qty 5

## 2022-06-07 MED ORDER — HEPARIN SOD (PORK) LOCK FLUSH 100 UNIT/ML IV SOLN
500.0000 [IU] | Freq: Once | INTRAVENOUS | Status: AC
  Administered 2022-06-07: 500 [IU]

## 2022-06-07 NOTE — Patient Instructions (Signed)
[facility]  Discharge Instructions: Thank you for choosing [facility] to provide your oncology and hematology care.   If you have a lab appointment with the [facility], please go directly to the [facility] and check in at the registration area.   Wear comfortable clothing and clothing appropriate for easy access to any Portacath or PICC line.   We strive to give you quality time with your provider. You may need to reschedule your appointment if you arrive late (15 or more minutes).  Arriving late affects you and other patients whose appointments are after yours.  Also, if you miss three or more appointments without notifying the office, you may be dismissed from the clinic at the provider's discretion.      For prescription refill requests, have your pharmacy contact our office and allow 72 hours for refills to be completed.    Today you received the following chemotherapy and/or immunotherapy agents: Herceptin Hylecta.       To help prevent nausea and vomiting after your treatment, we encourage you to take your nausea medication as directed.  BELOW ARE SYMPTOMS THAT SHOULD BE REPORTED IMMEDIATELY: *FEVER GREATER THAN 100.4 F (38 C) OR HIGHER *CHILLS OR SWEATING *NAUSEA AND VOMITING THAT IS NOT CONTROLLED WITH YOUR NAUSEA MEDICATION *UNUSUAL SHORTNESS OF BREATH *UNUSUAL BRUISING OR BLEEDING *URINARY PROBLEMS (pain or burning when urinating, or frequent urination) *BOWEL PROBLEMS (unusual diarrhea, constipation, pain near the anus) TENDERNESS IN MOUTH AND THROAT WITH OR WITHOUT PRESENCE OF ULCERS (sore throat, sores in mouth, or a toothache) UNUSUAL RASH, SWELLING OR PAIN  UNUSUAL VAGINAL DISCHARGE OR ITCHING   Items with * indicate a potential emergency and should be followed up as soon as possible or go to the Emergency Department if any problems should occur.  Please show the CHEMOTHERAPY ALERT CARD or IMMUNOTHERAPY ALERT  CARD at check-in to the Emergency Department and triage nurse.  Should you have questions after your visit or need to cancel or reschedule your appointment, please contact [facility]  Dept: [phone]  and follow the prompts.  Office hours are 8:00 a.m. to 4:30 p.m. Monday - Friday. Please note that voicemails left after 4:00 p.m. may not be returned until the following business day.  We are closed weekends and major holidays. You have access to a nurse at all times for urgent questions. Please call the main number to the clinic Dept: [phone] and follow the prompts.   For any non-urgent questions, you may also contact your provider using MyChart. We now offer e-Visits for anyone 17 and older to request care online for non-urgent symptoms. For details visit [email or web address].   Also download the MyChart app! Go to the app store, search "MyChart", open the app, select [facility], and log in with your MyChart username and password.

## 2022-06-07 NOTE — Progress Notes (Signed)
[facility] PROGRESS NOTE  Patient Care Team: Collene Leyden, MD as PCP - General (Family Medicine) [name], MD as Consulting Physician (General Surgery) Benay Pike, MD as Consulting Physician (Hematology and Oncology) Kyung Rudd, MD as Consulting Physician (Radiation Oncology) Mauro Kaufmann, RN as Oncology Nurse Navigator Rockwell Germany, RN as Oncology Nurse Navigator  CHIEF COMPLAINTS/PURPOSE OF CONSULTATION:  Breast cancer  SUMMARY OF ONCOLOGIC HISTORY: Oncology History  Malignant neoplasm of upper-outer quadrant of right breast in female, estrogen receptor positive ([facility])  08/12/2021 Mammogram   Mammogram showed possible mass in the right breast.  Diagnostic mammogram showed 2 highly suspicious upper outer right breast masses measuring 1.5 cm in the 9:30 position and 1.4 cm at the 10 o'clock position.  These masses or distortions mammographically spanning a distance of up to 3.5 cm.  No abnormal appearing right axillary lymph nodes.   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  Prognostic showed ER 99% positive strong staining PR 10% positive moderate staining, HER2 positive 3+ and Ki-67 of 30%.   09/08/2021 Initial Diagnosis   Malignant neoplasm of upper-outer quadrant of right breast in female, estrogen receptor positive ([place])   09/15/2021 Pathology Results   She had right breast lumpectomy which showed invasive ductal carcinoma, 3.6 cm in maximal extent involving inferior and medial margins, DCIS approaching to less than 0.1 cm of closest margin no metastatic carcinoma identified in 4 out of 4 lymph nodes.  She is scheduled for repeat surgery given positive margins.  Prior prognostic showed ER 99% positive strong staining intensity PR 10% positive moderate staining intensity, HER2 positive.   09/23/2021 Genetic Testing   Negative hereditary cancer genetic testing: no pathogenic variants  detected in [facility] CustomNext-cancer +RNAinsight Panel.  Report date is September 23, 2021.   The CustomNext-Cancer+RNAinsight panel offered by [facility] includes sequencing and rearrangement analysis for the following 47 genes:  APC, ATM, AXIN2, BARD1, BMPR1A, BRCA1, BRCA2, BRIP1, CDH1, CDK4, CDKN2A, CHEK2, DICER1, EPCAM, GREM1, HOXB13, MEN1, MLH1, MSH2, MSH3, MSH6, MUTYH, NBN, NF1, NF2, NTHL1, PALB2, PMS2, POLD1, POLE, PTEN, RAD51C, RAD51D, RECQL, RET, SDHA, SDHAF2, SDHB, SDHC, SDHD, SMAD4, SMARCA4, STK11, TP53, TSC1, TSC2, and VHL.  RNA data is routinely analyzed for use in variant interpretation for all genes.   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 DCIS 0.25 mg from true/new margin.   11/10/2021 - 12/02/2021 Chemotherapy   Patient is on Treatment Plan : BREAST Docetaxel + Carboplatin + Trastuzumab ([facility]) q21d / Trastuzumab q21d     11/10/2021 - 12/03/2021 Chemotherapy   Patient is on Treatment Plan : BREAST  Docetaxel + Carboplatin + Trastuzumab + Pertuzumab  ([facility]P) q21d      11/10/2021 -  Chemotherapy   Patient is on Treatment Plan : BREAST Docetaxel + Carboplatin + Trastuzumab (TCH) q21d / Trastuzumab q21d      INTERVAL HISTORY:  Rhonda Richardson returns for a follow up visit while on herceptin and anastrozole.  She had a PCP visit last week, had burning urination and feels like she should go all the time. She also felt constipated. She felt like squishy balloon protruding from the vagina.  She said she didn't have a UTI. According to gynecology, no evidence of prolapse that needs intervention. She also stopped her iron because of constipation.  She  said since she stopped iron, she didn't have any of these issues. She once again wonders about why she feels fatigued, why her Hb is still low. She says she has to take care of the grandkids from time to  time and she has no energy. She is tolerating anastrozole ok otherwise. She asks good questions but doesn't allow me an opportunity to explain,  jumps from question to question. Rest of the pertinent 10 point ROS reviewed and negative  MEDICAL HISTORY:  Past Medical History:  Diagnosis Date   Breast cancer ([place])    Hypertension     SURGICAL HISTORY: Past Surgical History:  Procedure Laterality Date   BREAST LUMPECTOMY WITH RADIOACTIVE SEED AND SENTINEL LYMPH NODE BIOPSY Right 09/15/2021   Procedure: RIGHT BREAST SEED BRACKETED LUMPECTOMY AND SENTINEL NODE BIOPSY;  Surgeon: [name], MD;  Location: [facility];  Service: General;  Laterality: Right;   cataract surgery Bilateral    DILATION AND CURETTAGE OF UTERUS  1970   EXCISION OF BREAST LESION Right 10/15/2021   Procedure: ASPIRATION RIGHT AXILLARY SEROMA;  Surgeon: [name], MD;  Location: [facility];  Service: General;  Laterality: Right;   eyelid tendon repair Bilateral    PORTACATH PLACEMENT N/A 09/15/2021   Procedure: PORT PLACEMENT;  Surgeon: [name], MD;  Location: [facility];  Service: General;  Laterality: N/A;   RE-EXCISION OF BREAST LUMPECTOMY Right 10/15/2021   Procedure: RE-EXCISION LUMPECTOMY RIGHT BREAST;  Surgeon: [name], MD;  Location: [facility];  Service: General;  Laterality: Right;    SOCIAL HISTORY: Social History   Socioeconomic History   Marital status: Married    Spouse name: Not on file   Number of children: Not on file   Years of education: Not on file   Highest education level: Not on file  Occupational History   Not on file  Tobacco Use   Smoking status: Never   Smokeless tobacco: Not on file  Vaping Use   Vaping Use: Never used  Substance and Sexual Activity   Alcohol use: Yes    Comment: 3- 4 drinks   Drug use: Never   Sexual activity: Not Currently  Other Topics Concern   Not on file  Social History Narrative   Not on file   Social Determinants of  Health   Financial Resource Strain: Low Risk  (09/09/2021)   Overall Financial Resource Strain (CARDIA)    Difficulty of Paying Living Expenses: Not hard at all  Food Insecurity: No Food Insecurity (09/09/2021)   Hunger Vital Sign    Worried About Running Out of Food in the Last Year: Never true    Ran Out of Food in the Last Year: Never true  Transportation Needs: No Transportation Needs (09/09/2021)   PRAPARE - Hydrologist (Medical): No    Lack of Transportation (Non-Medical): No  Physical Activity: Not on file  Stress: Not on file  Social Connections: Not on file  Intimate Partner Violence: Not on file    FAMILY HISTORY: Family History  Problem Relation Age of Onset   Heart disease Father 42   Thyroid cancer Other 33       niece's daughter    ALLERGIES:  is allergic to macrodantin [nitrofurantoin].  MEDICATIONS:  Current Outpatient Medications  Medication Sig Dispense Refill   acetaminophen (TYLENOL) 325 MG tablet Take 325 mg by mouth every 6 (six) hours as needed for moderate pain.     benazepril (LOTENSIN)  20 MG tablet Take 20 mg by mouth daily.     cholecalciferol (VITAMIN D3) 25 MCG (1000 UNIT) tablet Take 1,000 Units by mouth daily.     famotidine (PEPCID) 20 MG tablet Take 20 mg by mouth daily as needed for heartburn or indigestion.     hydrochlorothiazide (MICROZIDE) 12.5 MG capsule Take 12.5 mg by mouth daily.     ibuprofen (ADVIL) 200 MG tablet Take 200 mg by mouth every 6 (six) hours as needed for moderate pain.     iron polysaccharides (FERREX 150) 150 MG capsule Take 150 mg by mouth daily.     Magnesium 250 MG TABS Take 250 mg by mouth daily.     traMADol (ULTRAM) 50 MG tablet Take by mouth every 6 (six) hours as needed.     Vitamins-Lipotropics (MULTI-VITAMIN HP/MINERALS PO) Take 1 tablet by mouth daily.     No current facility-administered medications for this visit.    PHYSICAL EXAMINATION: ECOG PERFORMANCE STATUS: 0 -  Asymptomatic  Vitals:   06/07/22 0844  BP: (!) 148/64  Pulse: 79  Resp: 16  Temp: 97.8 F (36.6 C)  SpO2: 100%     Filed Weights   06/07/22 0844  Weight: 140 lb (63.5 kg)    Physical Exam Constitutional:      Appearance: Normal appearance.  Cardiovascular:     Rate and Rhythm: Normal rate and regular rhythm.  Pulmonary:     Effort: Pulmonary effort is normal.     Breath sounds: Normal breath sounds.  Musculoskeletal:        General: No swelling.     Cervical back: Normal range of motion and neck supple. No rigidity.  Lymphadenopathy:     Cervical: No cervical adenopathy.  Skin:    Coloration: Skin is not jaundiced.  Neurological:     General: No focal deficit present.     Mental Status: She is alert.      LABORATORY DATA:  I have reviewed the data as listed Lab Results  Component Value Date   WBC 5.3 06/07/2022   HGB 10.6 (L) 06/07/2022   HCT 32.6 (L) 06/07/2022   MCV 83.6 06/07/2022   PLT 198 06/07/2022   Lab Results  Component Value Date   NA 140 04/26/2022   K 3.8 04/26/2022   CL 105 04/26/2022   CO2 29 04/26/2022    RADIOGRAPHIC STUDIES: I have personally reviewed the radiological reports and agreed with the findings in the report.  ASSESSMENT AND PLAN:  Rhonda Richardson is a 82 y.o. female who presents for a follow up for right breast cancer.   #Malignant neoplasm of upper-outer quadrant of right breast in female, estrogen receptor positive   --ER/PR positive, HER2 amplified.  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 Media margin showed focal high-grade ductal carcinoma in situ, solid type with necrosis, negative for invasive carcinoma. Inferior and posterior margin negative for carcinoma.  --Given large tumor, triple positive, recommend dose modified TCH for 6 cycles followed by  adjuvant herceptin vs taxol with herceptin. -- Most recent ECHO with EF of 55-60%, will continue to monitor. She is clinically asymptomatic. Next ECHO scheduled for March. --She is on anastrozole, tolerating it very well except for GUS of menopause --She has the urge to urinate with no evidence of UTI --She will continue anastrozole as recommended. Bone density scheduled.  # Normocytic normochromic anemia.  No Iron. B12 deficiency No hemolysis RBC folate pending At this time, I still believe she is recovering from recent chemotherapy Hb now low enough to warrant any transfusion  # Fatigue Likely multifactorial given recent chemo, radiation and mild anemia.  RTC in 6 weeks.    I have spent a total of 40 minutes minutes of face-to-face and non-face-to-face time, preparing to see the patient, performing a medically appropriate examination, counseling and educating the patient, documenting clinical information in the electronic health record, and care coordination.

## 2022-06-08 LAB — FOLATE RBC
Folate, Hemolysate: 463 ng/mL
Folate, RBC: 1362 ng/mL (ref >498)
Hematocrit: 34 % (ref 34.0–46.6)

## 2022-06-14 ENCOUNTER — Ambulatory Visit
Admission: RE | Admit: 2022-06-14 | Discharge: 2022-06-14 | Disposition: A | Discharge status: Home / Self Care | Payer: Medicare Other | Source: Ambulatory Visit | Attending: Radiation Oncology | Admitting: Radiation Oncology

## 2022-06-14 NOTE — Progress Notes (Signed)
  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   Symptoms of fatigue have improved since completing therapy.  Symptoms of skin changes have improved since completing therapy.  The patient was encouraged to avoid sun exposure in the area of prior treatment for up to one year following radiation with either sunscreen or by the style of clothing worn in the sun.  The patient has scheduled follow up with her medical oncologist Dr. Chryl Heck for ongoing surveillance, and was encouraged to call if she develops concerns or questions regarding radiation.    This concludes the interview.   Leandra Kern, LPN

## 2022-06-15 ENCOUNTER — Encounter: Payer: Self-pay | Admitting: *Deleted

## 2022-06-20 NOTE — Therapy (Signed)
  OUTPATIENT PHYSICAL THERAPY SOZO SCREENING NOTE   Patient Name: Rhonda Richardson MRN: [id number] DOB:1940/12/31, 82 y.o., female Today's Date: 06/21/2022  PCP: Collene Leyden, MD REFERRING PROVIDER: [name], MD     Past Medical History:  Diagnosis Date   Breast cancer [facility])    Hypertension    Past Surgical History:  Procedure Laterality Date   BREAST LUMPECTOMY WITH RADIOACTIVE SEED AND SENTINEL LYMPH NODE BIOPSY Right 09/15/2021   Procedure: RIGHT BREAST SEED BRACKETED LUMPECTOMY AND SENTINEL NODE BIOPSY;  Surgeon: [name], MD;  Location: [place];  Service: General;  Laterality: Right;   cataract surgery Bilateral    DILATION AND CURETTAGE OF UTERUS  1970   EXCISION OF BREAST LESION Right 10/15/2021   Procedure: ASPIRATION RIGHT AXILLARY SEROMA;  Surgeon: [name], MD;  Location: [place];  Service: General;  Laterality: Right;   eyelid tendon repair Bilateral    PORTACATH PLACEMENT N/A 09/15/2021   Procedure: PORT PLACEMENT;  Surgeon: [name], MD;  Location: [place];  Service: General;  Laterality: N/A;   RE-EXCISION OF BREAST LUMPECTOMY Right 10/15/2021   Procedure: RE-EXCISION LUMPECTOMY RIGHT BREAST;  Surgeon: [name], MD;  Location: [place];  Service: General;  Laterality: Right;   Patient Active Problem List   Diagnosis Date Noted   Port-A-Cath in place 11/18/2021   Genetic testing 09/28/2021   Malignant neoplasm of upper-outer quadrant of right breast in female, estrogen receptor positive ([place]) 09/08/2021    REFERRING DIAG: right breast cancer at risk for lymphedema  THERAPY DIAG:  No diagnosis found.  PERTINENT HISTORY: right UE Lymphedema risk, None   PRECAUTIONS: right UE Lymphedema risk, None  SUBJECTIVE: Pt returns for her 3 month L-dex screen.   PAIN:  Are you having pain? No  SOZO SCREENING: Patient was assessed today using the SOZO machine to determine the lymphedema index score. This was compared  to her baseline score. It was determined that she is within the recommended range when compared to her baseline and no further action is needed at this time. She will continue SOZO screenings. These are done every 3 months for 2 years post operatively followed by every 6 months for 2 years, and then annually.   L-DEX FLOWSHEETS - 06/21/22 1500       L-DEX LYMPHEDEMA SCREENING   Measurement Type Unilateral    L-DEX MEASUREMENT EXTREMITY Upper Extremity    POSITION  Standing    DOMINANT SIDE Right    At Risk Side Right    BASELINE SCORE (UNILATERAL) -1.9    L-DEX SCORE (UNILATERAL) -5.5    VALUE CHANGE (UNILAT) -3.6                Julienne Vogler R, PT 06/21/2022, 3:11 PM

## 2022-06-21 ENCOUNTER — Ambulatory Visit (HOSPITAL_COMMUNITY)
Admission: RE | Admit: 2022-06-21 | Discharge: 2022-06-21 | Disposition: A | Discharge status: Home / Self Care | Payer: Medicare Other | Source: Ambulatory Visit | Attending: Hematology and Oncology | Admitting: Hematology and Oncology

## 2022-06-21 ENCOUNTER — Other Ambulatory Visit: Payer: Self-pay | Admitting: Hematology and Oncology

## 2022-06-21 ENCOUNTER — Ambulatory Visit: Payer: Medicare Other | Attending: General Surgery | Admitting: Rehabilitation

## 2022-06-21 DIAGNOSIS — Z170 Estrogen receptor positive status [ER+]: Secondary | ICD-10-CM | POA: Insufficient documentation

## 2022-06-21 DIAGNOSIS — C50411 Malignant neoplasm of upper-outer quadrant of right female breast: Secondary | ICD-10-CM | POA: Diagnosis not present

## 2022-06-21 DIAGNOSIS — E7800 Pure hypercholesterolemia, unspecified: Secondary | ICD-10-CM | POA: Diagnosis not present

## 2022-06-21 DIAGNOSIS — I10 Essential (primary) hypertension: Secondary | ICD-10-CM | POA: Diagnosis not present

## 2022-06-21 DIAGNOSIS — Z0189 Encounter for other specified special examinations: Secondary | ICD-10-CM | POA: Diagnosis not present

## 2022-06-21 DIAGNOSIS — K219 Gastro-esophageal reflux disease without esophagitis: Secondary | ICD-10-CM | POA: Diagnosis not present

## 2022-06-21 DIAGNOSIS — M810 Age-related osteoporosis without current pathological fracture: Secondary | ICD-10-CM | POA: Diagnosis not present

## 2022-06-21 DIAGNOSIS — Z08 Encounter for follow-up examination after completed treatment for malignant neoplasm: Secondary | ICD-10-CM | POA: Diagnosis not present

## 2022-06-21 DIAGNOSIS — Z483 Aftercare following surgery for neoplasm: Secondary | ICD-10-CM | POA: Insufficient documentation

## 2022-06-21 LAB — ECHOCARDIOGRAM COMPLETE
Area-P 1/2: 2.31 cm2
Calc EF: 55 %
S' Lateral: 2.5 cm
Single Plane A2C EF: 56.2 %
Single Plane A4C EF: 54.9 %

## 2022-06-24 ENCOUNTER — Telehealth (HOSPITAL_COMMUNITY): Payer: Self-pay

## 2022-06-24 NOTE — Telephone Encounter (Signed)
Spoke with Patient available to come for appointment on 06/30/22. Appointment scheduled

## 2022-06-28 ENCOUNTER — Other Ambulatory Visit: Payer: Medicare Other

## 2022-06-28 ENCOUNTER — Ambulatory Visit: Payer: Medicare Other

## 2022-06-28 ENCOUNTER — Inpatient Hospital Stay: Payer: Medicare Other

## 2022-06-28 ENCOUNTER — Ambulatory Visit: Payer: Medicare Other | Admitting: Hematology and Oncology

## 2022-06-30 ENCOUNTER — Ambulatory Visit (HOSPITAL_COMMUNITY)
Admission: RE | Admit: 2022-06-30 | Discharge: 2022-06-30 | Disposition: A | Discharge status: Home / Self Care | Payer: Medicare Other | Source: Ambulatory Visit | Attending: Internal Medicine | Admitting: Internal Medicine

## 2022-06-30 VITALS — BP 180/90 | HR 88 | Wt 134.0 lb

## 2022-06-30 DIAGNOSIS — C50411 Malignant neoplasm of upper-outer quadrant of right female breast: Secondary | ICD-10-CM | POA: Diagnosis not present

## 2022-06-30 DIAGNOSIS — Z79899 Other long term (current) drug therapy: Secondary | ICD-10-CM

## 2022-06-30 DIAGNOSIS — Z853 Personal history of malignant neoplasm of breast: Secondary | ICD-10-CM | POA: Insufficient documentation

## 2022-06-30 DIAGNOSIS — E669 Obesity, unspecified: Secondary | ICD-10-CM | POA: Insufficient documentation

## 2022-06-30 DIAGNOSIS — C50911 Malignant neoplasm of unspecified site of right female breast: Secondary | ICD-10-CM | POA: Diagnosis not present

## 2022-06-30 DIAGNOSIS — R931 Abnormal findings on diagnostic imaging of heart and coronary circulation: Secondary | ICD-10-CM | POA: Diagnosis not present

## 2022-06-30 DIAGNOSIS — I252 Old myocardial infarction: Secondary | ICD-10-CM | POA: Diagnosis not present

## 2022-06-30 DIAGNOSIS — Z170 Estrogen receptor positive status [ER+]: Secondary | ICD-10-CM | POA: Insufficient documentation

## 2022-06-30 DIAGNOSIS — Z87891 Personal history of nicotine dependence: Secondary | ICD-10-CM | POA: Insufficient documentation

## 2022-06-30 DIAGNOSIS — Z8249 Family history of ischemic heart disease and other diseases of the circulatory system: Secondary | ICD-10-CM | POA: Diagnosis not present

## 2022-06-30 DIAGNOSIS — Z79811 Long term (current) use of aromatase inhibitors: Secondary | ICD-10-CM | POA: Diagnosis not present

## 2022-06-30 DIAGNOSIS — I10 Essential (primary) hypertension: Secondary | ICD-10-CM | POA: Diagnosis not present

## 2022-06-30 DIAGNOSIS — Z923 Personal history of irradiation: Secondary | ICD-10-CM | POA: Diagnosis not present

## 2022-06-30 DIAGNOSIS — Z5181 Encounter for therapeutic drug level monitoring: Secondary | ICD-10-CM

## 2022-06-30 NOTE — Patient Instructions (Signed)
EKG done today.  No Labs done today.   No medication changes were made. Please continue all current medications as prescribed.  Non-Cardiac CT scanning, (CAT scanning), is a noninvasive, special x-ray that produces cross-sectional images of the body using x-rays and a computer. CT scans help physicians diagnose and treat medical conditions. For some CT exams, a contrast material is used to enhance visibility in the area of the body being studied. CT scans provide greater clarity and reveal more details than regular x-ray exams. This has to be approved through your insurance company prior to scheduling, once approved, we will contact you to schedule an appointment.   Your physician recommends that you schedule a follow-up appointment in: 1 month with an echo prior to your exam.  Your physician has requested that you have an echocardiogram. Echocardiography is a painless test that uses sound waves to create images of your heart. It provides your doctor with information about the size and shape of your heart and how well your heart's chambers and valves are working. This procedure takes approximately one hour. There are no restrictions for this procedure. Please do NOT wear cologne, perfume, aftershave, or lotions (deodorant is allowed). Please arrive 15 minutes prior to your appointment time.  If you have any questions or concerns before your next appointment please send [place] a message through [place] or call our office at [phone].    TO LEAVE A MESSAGE FOR THE NURSE SELECT OPTION 2, PLEASE LEAVE A MESSAGE INCLUDING: YOUR NAME DATE OF BIRTH CALL BACK NUMBER REASON FOR CALL**this is important as we prioritize the call backs  YOU WILL RECEIVE A CALL BACK THE SAME DAY AS LONG AS YOU CALL BEFORE 4:00 PM   Do the following things EVERYDAY: Weigh yourself in the morning before breakfast. Write it down and keep it in a log. Take your medicines as prescribed Eat low salt foods--Limit salt  (sodium) to 2000 mg per day.  Stay as active as you can everyday Limit all fluids for the day to less than 2 liters   At the [facility], you and your health needs are our priority. As part of our continuing mission to provide you with exceptional heart care, we have created designated Provider Care Teams. These Care Teams include your primary Cardiologist (physician) and Advanced Practice Providers (APPs- Physician Assistants and Nurse Practitioners) who all work together to provide you with the care you need, when you need it.   You may see any of the following providers on your designated Care Team at your next follow up: Dr Glori Bickers Dr Haynes Kerns, NP Lyda Jester, [place] Audry Riles, PharmD   Please be sure to bring in all your medications bottles to every appointment.

## 2022-06-30 NOTE — Progress Notes (Signed)
CARDIO-ONCOLOGY CLINIC CONSULT NOTE  Referring Physician: Dr. Chryl Heck Primary Care: Collene Leyden, MD Primary Cardiologist: New  HPI:  Rhonda Richardson is 82 y.o. female with HTN, right breast cancer referred by Dr. Chryl Heck for further evaluation of abnormal echo.   Diagnosed with R breast CA in 5/23 ER + PR - HER2 +. In 6/23 had lumpectomy with positive margins. LNs clear. 7/23 re-excision of margins.   In 8/23 stated chemo with docetaxel + Carboplatin + Trastuzumab (TCH) q21d / Trastuzumab q21d.   Switched to Herceptin alone in 12/23 -> getting q3weeks  XRT completed in 1/24 (20 treatments)  Recently started arimidex.   Echo 5/23 EF 55-65% GLS -23.1% Echo 9/23 EF 60-65% no GLS due to poor tracking Echo 03/29/22 EF 60-65% GLS -21.6%  Echo 50-55% EF 50-55% read as anteroseptal and inferoseptal HK GLS -15.6% (likely underestimated)  Denies any h/o known heart disease. Says her BP usually well controlled at home SBP 110-130. Quit smoking in 1968 (smoked a little in college). Very active. No CP or SOB.   She has never had a chest CT   Family history positive for heart disease: M had MI at 80 y/o died in 22s F died at 58 from MI/HF (smoker) MGF died at 80 y/o with MI B possible MI in 54s died in 26s during stress test (morbidly obese) B + heart disease (obese smoker)   Review of Systems: [y] = yes, [ ]  = no   General: Weight gain [ ] ; Weight loss [ ] ; Anorexia [ ] ; Fatigue [ ] ; Fever [ ] ; Chills [ ] ; Weakness [ ]   Cardiac: Chest pain/pressure [ ] ; Resting SOB [ ] ; Exertional SOB [ ] ; Orthopnea [ ] ; Pedal Edema [ ] ; Palpitations [ ] ; Syncope [ ] ; Presyncope [ ] ; Paroxysmal nocturnal dyspnea[ ]   Pulmonary: Cough [ ] ; Wheezing[ ] ; Hemoptysis[ ] ; Sputum [ ] ; Snoring [ ]   GI: Vomiting[ ] ; Dysphagia[ ] ; Melena[ ] ; Hematochezia [ ] ; Heartburn[ ] ; Abdominal pain [ ] ; Constipation [ ] ; Diarrhea [ ] ; BRBPR [ ]   GU: Hematuria[ ] ; Dysuria [ ] ; Nocturia[ ]   Vascular: Pain in legs with walking [ ] ;  Pain in feet with lying flat [ ] ; Non-healing sores [ ] ; Stroke [ ] ; TIA [ ] ; Slurred speech [ ] ;  Neuro: Headaches[ ] ; Vertigo[ ] ; Seizures[ ] ; Paresthesias[ ] ;Blurred vision [ ] ; Diplopia [ ] ; Vision changes [ ]   Ortho/Skin: Arthritis [ ] ; Joint pain [ ] ; Muscle pain [ ] ; Joint swelling [ ] ; Back Pain [ ] ; Rash [ ]   Psych: Depression[ ] ; Anxiety[y ]  Heme: Bleeding problems [ ] ; Clotting disorders [ ] ; Anemia [ ]   Endocrine: Diabetes [ ] ; Thyroid dysfunction[ ]    Past Medical History:  Diagnosis Date   Breast cancer (HCC)    Hypertension     Current Outpatient Medications  Medication Sig Dispense Refill   acetaminophen (TYLENOL) 325 MG tablet Take 325 mg by mouth every 6 (six) hours as needed for moderate pain.     anastrozole (ARIMIDEX) 1 MG tablet Take 1 mg by mouth daily.     benazepril (LOTENSIN) 20 MG tablet Take 20 mg by mouth daily.     cholecalciferol (VITAMIN D3) 25 MCG (1000 UNIT) tablet Take 1,000 Units by mouth daily.     famotidine (PEPCID) 20 MG tablet Take 20 mg by mouth daily as needed for heartburn or indigestion.     hydrochlorothiazide (MICROZIDE) 12.5 MG capsule Take 12.5 mg by mouth daily.  ibuprofen (ADVIL) 200 MG tablet Take 200 mg by mouth every 6 (six) hours as needed for moderate pain.     iron polysaccharides (FERREX 150) 150 MG capsule Take 150 mg by mouth daily.     lidocaine-prilocaine (EMLA) cream Apply to affected area once External for 30 Days     Magnesium 250 MG TABS Take 250 mg by mouth daily.     prochlorperazine (COMPAZINE) 5 MG tablet Take 5 mg by mouth every 6 (six) hours as needed for nausea or vomiting.     Red Yeast Rice Extract 600 MG CAPS 2 capsule Orally once a day     traMADol (ULTRAM) 50 MG tablet Take by mouth every 6 (six) hours as needed.     Vitamins-Lipotropics (MULTI-VITAMIN HP/MINERALS PO) Take 1 tablet by mouth daily.     No current facility-administered medications for this encounter.    Allergies  Allergen Reactions    Macrodantin [Nitrofurantoin] Hives      Social History   Socioeconomic History   Marital status: Married    Spouse name: Not on file   Number of children: Not on file   Years of education: Not on file   Highest education level: Not on file  Occupational History   Not on file  Tobacco Use   Smoking status: Never   Smokeless tobacco: Not on file  Vaping Use   Vaping Use: Never used  Substance and Sexual Activity   Alcohol use: Yes    Comment: 3- 4 drinks   Drug use: Never   Sexual activity: Not Currently  Other Topics Concern   Not on file  Social History Narrative   Not on file   Social Determinants of Health   Financial Resource Strain: Low Risk  (09/09/2021)   Overall Financial Resource Strain (CARDIA)    Difficulty of Paying Living Expenses: Not hard at all  Food Insecurity: No Food Insecurity (09/09/2021)   Hunger Vital Sign    Worried About Running Out of Food in the Last Year: Never true    [place] in the Last Year: Never true  Transportation Needs: No Transportation Needs (09/09/2021)   PRAPARE - Hydrologist (Medical): No    Lack of Transportation (Non-Medical): No  Physical Activity: Not on file  Stress: Not on file  Social Connections: Not on file  Intimate Partner Violence: Not on file      Family History  Problem Relation Age of Onset   Heart disease Father 40   Thyroid cancer Other 14       niece's daughter    Vitals:   06/30/22 1112  BP: (!) 180/90  Pulse: 88  SpO2: 99%  Weight: 60.8 kg (134 lb)    PHYSICAL EXAM: General:  Well appearing. No respiratory difficulty HEENT: normal Neck: supple. no JVD. Carotids 2+ bilat; no bruits. No lymphadenopathy or thryomegaly appreciated. Cor: PMI nondisplaced. Regular rate & rhythm. No rubs, gallops or murmurs. Lungs: clear Abdomen: soft, nontender, nondistended. No hepatosplenomegaly. No bruits or masses. Good bowel sounds. Extremities: no cyanosis, clubbing,  rash, edema Neuro: alert & oriented x 3, cranial nerves grossly intact. moves all 4 extremities w/o difficulty. Affect pleasant.  ECG:   ASSESSMENT & PLAN: 1. Abnormal echo - Echo 5/23 EF 55-60% GLS -23.1% - Echo 9/23 EF 60-65% no GLS due to poor tracking - Echo 03/29/22 EF 55-60% GLS -21.6%  - Echo 50-55% EF 50-55% read as anteroseptal and inferoseptal HK GLS -  15.6% (likely underestimated) - I have reviewed all echos. In looking at most recent echo, I think EF 55-60% and don't see any convincing WMAs. Also the timeline and of potential LV dys   *** Breast Cancer   Rhonda Bickers, MD  11:13 AM

## 2022-07-02 ENCOUNTER — Other Ambulatory Visit: Payer: Self-pay | Admitting: Hematology and Oncology

## 2022-07-05 ENCOUNTER — Ambulatory Visit: Payer: Self-pay

## 2022-07-19 ENCOUNTER — Inpatient Hospital Stay: Payer: Medicare Other | Attending: Hematology and Oncology

## 2022-07-19 ENCOUNTER — Inpatient Hospital Stay: Payer: Medicare Other

## 2022-07-19 ENCOUNTER — Encounter: Payer: Self-pay | Admitting: Hematology and Oncology

## 2022-07-19 ENCOUNTER — Inpatient Hospital Stay (HOSPITAL_BASED_OUTPATIENT_CLINIC_OR_DEPARTMENT_OTHER): Payer: Medicare Other | Admitting: Hematology and Oncology

## 2022-07-19 DIAGNOSIS — Z5112 Encounter for antineoplastic immunotherapy: Secondary | ICD-10-CM | POA: Diagnosis not present

## 2022-07-19 DIAGNOSIS — Z79899 Other long term (current) drug therapy: Secondary | ICD-10-CM | POA: Diagnosis not present

## 2022-07-19 DIAGNOSIS — Z95828 Presence of other vascular implants and grafts: Secondary | ICD-10-CM

## 2022-07-19 DIAGNOSIS — Z881 Allergy status to other antibiotic agents status: Secondary | ICD-10-CM | POA: Insufficient documentation

## 2022-07-19 DIAGNOSIS — R42 Dizziness and giddiness: Secondary | ICD-10-CM | POA: Diagnosis not present

## 2022-07-19 DIAGNOSIS — Z8249 Family history of ischemic heart disease and other diseases of the circulatory system: Secondary | ICD-10-CM | POA: Diagnosis not present

## 2022-07-19 DIAGNOSIS — C50411 Malignant neoplasm of upper-outer quadrant of right female breast: Secondary | ICD-10-CM

## 2022-07-19 DIAGNOSIS — D649 Anemia, unspecified: Secondary | ICD-10-CM | POA: Insufficient documentation

## 2022-07-19 DIAGNOSIS — Z170 Estrogen receptor positive status [ER+]: Secondary | ICD-10-CM

## 2022-07-19 DIAGNOSIS — Z79811 Long term (current) use of aromatase inhibitors: Secondary | ICD-10-CM | POA: Insufficient documentation

## 2022-07-19 DIAGNOSIS — Z5111 Encounter for antineoplastic chemotherapy: Secondary | ICD-10-CM | POA: Diagnosis present

## 2022-07-19 DIAGNOSIS — Z808 Family history of malignant neoplasm of other organs or systems: Secondary | ICD-10-CM | POA: Diagnosis not present

## 2022-07-19 DIAGNOSIS — R5383 Other fatigue: Secondary | ICD-10-CM | POA: Diagnosis not present

## 2022-07-19 DIAGNOSIS — R110 Nausea: Secondary | ICD-10-CM | POA: Insufficient documentation

## 2022-07-19 LAB — CMP (CANCER CENTER ONLY)
ALT: 31 U/L (ref 0–44)
AST: 24 U/L (ref 15–41)
Albumin: 3.9 g/dL (ref 3.5–5.0)
Alkaline Phosphatase: 58 U/L (ref 38–126)
Anion gap: 5 (ref 5–15)
BUN: 24 mg/dL — ABNORMAL HIGH (ref 8–23)
CO2: 29 mmol/L (ref 22–32)
Calcium: 9.4 mg/dL (ref 8.9–10.3)
Chloride: 104 mmol/L (ref 98–111)
Creatinine: 0.89 mg/dL (ref 0.44–1.00)
GFR, Estimated: >60 mL/min (ref >60)
Glucose, Bld: 90 mg/dL (ref 70–99)
Potassium: 4 mmol/L (ref 3.5–5.1)
Sodium: 138 mmol/L (ref 135–145)
Total Bilirubin: 0.3 mg/dL (ref 0.3–1.2)
Total Protein: 6.5 g/dL (ref 6.5–8.1)

## 2022-07-19 LAB — CBC WITH DIFFERENTIAL (CANCER CENTER ONLY)
Abs Immature Granulocytes: 0.01 10*3/uL (ref 0.00–0.07)
Basophils Absolute: 0 10*3/uL (ref 0.0–0.1)
Basophils Relative: 1 %
Eosinophils Absolute: 0.1 10*3/uL (ref 0.0–0.5)
Eosinophils Relative: 3 %
HCT: 31.8 % — ABNORMAL LOW (ref 36.0–46.0)
Hemoglobin: 10.6 g/dL — ABNORMAL LOW (ref 12.0–15.0)
Immature Granulocytes: 0 %
Lymphocytes Relative: 31 %
Lymphs Abs: 1.2 10*3/uL (ref 0.7–4.0)
MCH: 26.9 pg (ref 26.0–34.0)
MCHC: 33.3 g/dL (ref 30.0–36.0)
MCV: 80.7 fL (ref 80.0–100.0)
Monocytes Absolute: 0.3 10*3/uL (ref 0.1–1.0)
Monocytes Relative: 7 %
Neutro Abs: 2.3 10*3/uL (ref 1.7–7.7)
Neutrophils Relative %: 58 %
Platelet Count: 226 10*3/uL (ref 150–400)
RBC: 3.94 MIL/uL (ref 3.87–5.11)
RDW: 15 % (ref 11.5–15.5)
WBC Count: 4 10*3/uL (ref 4.0–10.5)
nRBC: 0 % (ref 0.0–0.2)

## 2022-07-19 MED ORDER — TRASTUZUMAB-HYALURONIDASE-OYSK 600-10000 MG-UNT/5ML ~~LOC~~ SOLN
600.0000 mg | Freq: Once | SUBCUTANEOUS | Status: AC
  Administered 2022-07-19: 600 mg via SUBCUTANEOUS
  Filled 2022-07-19: qty 5

## 2022-07-19 MED ORDER — HEPARIN SOD (PORK) LOCK FLUSH 100 UNIT/ML IV SOLN
500.0000 [IU] | Freq: Once | INTRAVENOUS | Status: AC
  Administered 2022-07-19: 500 [IU]

## 2022-07-19 MED ORDER — ACETAMINOPHEN 325 MG PO TABS
650.0000 mg | ORAL_TABLET | Freq: Once | ORAL | Status: AC
  Administered 2022-07-19: 650 mg via ORAL
  Filled 2022-07-19: qty 2

## 2022-07-19 MED ORDER — SODIUM CHLORIDE 0.9% FLUSH
10.0000 mL | Freq: Once | INTRAVENOUS | Status: AC
  Administered 2022-07-19: 10 mL

## 2022-07-19 NOTE — Progress Notes (Signed)
[facility] PROGRESS NOTE  Patient Care Team: Irven Coe, MD as PCP - General (Family Medicine) Almond Lint, MD as Consulting Physician (General Surgery) Rachel Moulds, MD as Consulting Physician (Hematology and Oncology) Dorothy Puffer, MD as Consulting Physician (Radiation Oncology) Pershing Proud, RN as Oncology Nurse Navigator Donnelly Angelica, RN as Oncology Nurse Navigator  CHIEF COMPLAINTS/PURPOSE OF CONSULTATION:  Breast cancer  SUMMARY OF ONCOLOGIC HISTORY: Oncology History  Malignant neoplasm of upper-outer quadrant of right breast in female, estrogen receptor positive  08/12/2021 Mammogram   Mammogram showed possible mass in the right breast.  Diagnostic mammogram showed 2 highly suspicious upper outer right breast masses measuring 1.5 cm in the 9:30 position and 1.4 cm at the 10 o'clock position.  These masses or distortions mammographically spanning a distance of up to 3.5 cm.  No abnormal appearing right axillary lymph nodes.   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  Prognostic showed ER 99% positive strong staining PR 10% positive moderate staining, HER2 positive 3+ and Ki-67 of 30%.   09/08/2021 Initial Diagnosis   Malignant neoplasm of upper-outer quadrant of right breast in female, estrogen receptor positive (HCC)   09/15/2021 Pathology Results   She had right breast lumpectomy which showed invasive ductal carcinoma, 3.6 cm in maximal extent involving inferior and medial margins, DCIS approaching to less than 0.1 cm of closest margin no metastatic carcinoma identified in 4 out of 4 lymph nodes.  She is scheduled for repeat surgery given positive margins.  Prior prognostic showed ER 99% positive strong staining intensity PR 10% positive moderate staining intensity, HER2 positive.   09/23/2021 Genetic Testing   Negative hereditary cancer genetic testing: no pathogenic variants detected  in [facility] CustomNext-cancer +RNAinsight Panel.  Report date is September 23, 2021.   The CustomNext-Cancer+RNAinsight panel offered by [employer] includes sequencing and rearrangement analysis for the following 47 genes:  APC, ATM, AXIN2, BARD1, BMPR1A, BRCA1, BRCA2, BRIP1, CDH1, CDK4, CDKN2A, CHEK2, DICER1, EPCAM, GREM1, HOXB13, MEN1, MLH1, MSH2, MSH3, MSH6, MUTYH, NBN, NF1, NF2, NTHL1, PALB2, PMS2, POLD1, POLE, PTEN, RAD51C, RAD51D, RECQL, RET, SDHA, SDHAF2, SDHB, SDHC, SDHD, SMAD4, SMARCA4, STK11, TP53, TSC1, TSC2, and VHL.  RNA data is routinely analyzed for use in variant interpretation for all genes.   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 DCIS 0.25 mg from true/new margin.   11/10/2021 - 12/02/2021 Chemotherapy   Patient is on Treatment Plan : BREAST Docetaxel + Carboplatin + Trastuzumab (TCH) q21d / Trastuzumab q21d     11/10/2021 - 12/03/2021 Chemotherapy   Patient is on Treatment Plan : BREAST  Docetaxel + Carboplatin + Trastuzumab + Pertuzumab  (TCHP) q21d      11/10/2021 -  Chemotherapy   Patient is on Treatment Plan : BREAST Docetaxel + Carboplatin + Trastuzumab (TCH) q21d / Trastuzumab q21d      INTERVAL HISTORY:  Rhonda Richardson returns for a follow up visit while on herceptin and anastrozole. She is doing well overall she says.  She had one episode of dizziness last night when she got out of bed and mild nausea. Other than this, she feels ok. She says it was a bit difficult to draw blood from her today. She is focusing on hydration as best as she can. With regards to GUS, she is going to  start pelvic floor rehab. She saw Dr Bensimhon, recommendation was to continue herceptin.  Rest of the pertinenGala Romneyt 10 point ROS reviewed and negative  MEDICAL HISTORY:  Past Medical History:  Diagnosis Date   Breast cancer    Hypertension      SURGICAL HISTORY: Past Surgical History:  Procedure Laterality Date   BREAST LUMPECTOMY WITH RADIOACTIVE SEED AND SENTINEL LYMPH NODE BIOPSY Right 09/15/2021   Procedure: RIGHT BREAST SEED BRACKETED LUMPECTOMY AND SENTINEL NODE BIOPSY;  Surgeon: Almond LintByerly, Faera, MD;  Location: [facility];  Service: General;  Laterality: Right;   cataract surgery Bilateral    DILATION AND CURETTAGE OF UTERUS  1970   EXCISION OF BREAST LESION Right 10/15/2021   Procedure: ASPIRATION RIGHT AXILLARY SEROMA;  Surgeon: Almond LintByerly, Faera, MD;  Location: [facility];  Service: General;  Laterality: Right;   eyelid tendon repair Bilateral    PORTACATH PLACEMENT N/A 09/15/2021   Procedure: PORT PLACEMENT;  Surgeon: Almond LintByerly, Faera, MD;  Location: [facility];  Service: General;  Laterality: N/A;   RE-EXCISION OF BREAST LUMPECTOMY Right 10/15/2021   Procedure: RE-EXCISION LUMPECTOMY RIGHT BREAST;  Surgeon: Almond LintByerly, Faera, MD;  Location: [facility];  Service: General;  Laterality: Right;    SOCIAL HISTORY: Social History   Socioeconomic History   Marital status: Married    Spouse name: Not on file   Number of children: Not on file   Years of education: Not on file   Highest education level: Not on file  Occupational History   Not on file  Tobacco Use   Smoking status: Never   Smokeless tobacco: Not on file  Vaping Use   Vaping Use: Never used  Substance and Sexual Activity   Alcohol use: Yes    Comment: 3- 4 drinks   Drug use: Never   Sexual activity: Not Currently  Other Topics Concern   Not on file  Social History Narrative   Not on file   Social Determinants of Health   Financial Resource Strain: Low Risk  (09/09/2021)   Overall Financial Resource Strain (CARDIA)    Difficulty of Paying Living Expenses: Not hard at all  Food Insecurity: No Food Insecurity (09/09/2021)   Hunger Vital Sign    Worried About Running Out of Food in the Last Year: Never true    Ran Out of Food in the Last  Year: Never true  Transportation Needs: No Transportation Needs (09/09/2021)   PRAPARE - Administrator, Civil ServiceTransportation    Lack of Transportation (Medical): No    Lack of Transportation (Non-Medical): No  Physical Activity: Not on file  Stress: Not on file  Social Connections: Not on file  Intimate Partner Violence: Not on file    FAMILY HISTORY: Family History  Problem Relation Age of Onset   Heart disease Father [id number]   Thyroid cancer Other 14       niece's daughter    ALLERGIES:  is allergic to macrodantin [nitrofurantoin].  MEDICATIONS:  Current Outpatient Medications  Medication Sig Dispense Refill   acetaminophen (TYLENOL) 325 MG tablet Take 325 mg by mouth every 6 (six) hours as needed for moderate pain.     anastrozole (ARIMIDEX) 1 MG tablet Take 1 mg by mouth daily.     benazepril (LOTENSIN) 20 MG tablet Take 20 mg by mouth daily.     cholecalciferol (VITAMIN D3) 25 MCG (1000 UNIT) tablet Take 1,000 Units by mouth daily.     famotidine (PEPCID) 20 MG tablet Take 20 mg by mouth daily  as needed for heartburn or indigestion.     hydrochlorothiazide (MICROZIDE) 12.5 MG capsule Take 12.5 mg by mouth daily.     ibuprofen (ADVIL) 200 MG tablet Take 200 mg by mouth every 6 (six) hours as needed for moderate pain.     iron polysaccharides (FERREX 150) 150 MG capsule Take 150 mg by mouth daily.     lidocaine-prilocaine (EMLA) cream Apply to affected area once External for 30 Days     Magnesium 250 MG TABS Take 250 mg by mouth daily.     prochlorperazine (COMPAZINE) 5 MG tablet Take 5 mg by mouth every 6 (six) hours as needed for nausea or vomiting.     Red Yeast Rice Extract 600 MG CAPS 2 capsule Orally once a day     traMADol (ULTRAM) 50 MG tablet Take by mouth every 6 (six) hours as needed.     Vitamins-Lipotropics (MULTI-VITAMIN HP/MINERALS PO) Take 1 tablet by mouth daily.     No current facility-administered medications for this visit.    PHYSICAL EXAMINATION: ECOG PERFORMANCE STATUS: 0 -  Asymptomatic  Vitals:   07/19/22 0939  BP: 138/82  Pulse: 72  Resp: 16  Temp: 97.8 F (36.6 C)  SpO2: 100%     Filed Weights   07/19/22 0939  Weight: 138 lb 3.2 oz (62.7 kg)    Physical Exam Constitutional:      Appearance: Normal appearance.  Cardiovascular:     Rate and Rhythm: Normal rate and regular rhythm.  Pulmonary:     Effort: Pulmonary effort is normal.     Breath sounds: Normal breath sounds.  Musculoskeletal:        General: No swelling.     Cervical back: Normal range of motion and neck supple. No rigidity.  Lymphadenopathy:     Cervical: No cervical adenopathy.  Skin:    Coloration: Skin is not jaundiced.  Neurological:     General: No focal deficit present.     Mental Status: She is alert.      LABORATORY DATA:  I have reviewed the data as listed Lab Results  Component Value Date   WBC 4.0 07/19/2022   HGB 10.6 (L) 07/19/2022   HCT 31.8 (L) 07/19/2022   MCV 80.7 07/19/2022   PLT 226 07/19/2022   Lab Results  Component Value Date   NA 139 06/07/2022   K 3.9 06/07/2022   CL 105 06/07/2022   CO2 29 06/07/2022    RADIOGRAPHIC STUDIES: I have personally reviewed the radiological reports and agreed with the findings in the report.  ASSESSMENT AND PLAN:  DARYLA KREFT is a 82 y.o. female who presents for a follow up for right breast cancer.   #Malignant neoplasm of upper-outer quadrant of right breast in female, estrogen receptor positive   --ER/PR positive, HER2 amplified.  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 Media margin showed focal high-grade ductal carcinoma in situ, solid type with necrosis, negative for invasive carcinoma. Inferior and posterior margin negative for carcinoma.  --Given large tumor, triple positive, recommend dose modified TCH for 6 cycles followed by  adjuvant herceptin vs taxol with herceptin. --She completed adjuvant TCH and now on maintenance herceptin --She is on anastrozole, tolerating it very well except for GUS of menopause --She will continue anastrozole as recommended. Bone density scheduled. --She saw Dr Gala Romney, no concerns, ok to continue herceptin.  #  Normocytic normochromic anemia. No Iron. B12 deficiency No hemolysis Stable, ok to continue monitoring.  # Fatigue Likely multifactorial given recent chemo, radiation and mild anemia.  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    I have spent a total of minutes minutes of face-to-face and non-face-to-face time, preparing to see the patient, performing a medically appropriate examination, counseling and educating the patient, documenting clinical information in the electronic health record, and care coordination.

## 2022-08-04 ENCOUNTER — Ambulatory Visit: Payer: Medicare Other | Attending: Obstetrics and Gynecology

## 2022-08-04 ENCOUNTER — Other Ambulatory Visit: Payer: Self-pay

## 2022-08-04 DIAGNOSIS — M6281 Muscle weakness (generalized): Secondary | ICD-10-CM | POA: Insufficient documentation

## 2022-08-04 DIAGNOSIS — R279 Unspecified lack of coordination: Secondary | ICD-10-CM | POA: Insufficient documentation

## 2022-08-04 DIAGNOSIS — R293 Abnormal posture: Secondary | ICD-10-CM | POA: Insufficient documentation

## 2022-08-04 NOTE — Patient Instructions (Addendum)
Squatty potty: When your knees are level or below the level of your hips, pelvic floor muscles are pressed against rectum, preventing ease of bowel movement. By getting knees above the level of the hips, these pelvic floor muscles relax, allowing easier passage of bowel movement.  Ways to get knees above hips: o Squatty Potty (7inch and 9inch versions) o Small stool o Roll of toilet paper under each foot o Hardback book or stack of magazines under each foot  Relaxed Toileting mechanics: Once in this position, make sure to lean forward with forearms on thighs, wide knees, relaxed stomach, and breathe.   [facility] [address], [address] [place], [place] [address] Phone # [phone] Fax [phone]  

## 2022-08-04 NOTE — Therapy (Signed)
OUTPATIENT PHYSICAL THERAPY FEMALE PELVIC EVALUATION   Patient Name: Rhonda Richardson MRN: [id number] DOB:November 13, 1940, 82 y.o., female Today's Date: 08/04/2022  END OF SESSION:  PT End of Session - 08/04/22 1448     Visit Number 1    Date for PT Re-Evaluation 10/13/22    Authorization Type Medicare    Progress Note Due on Visit 10    PT Start Time 1400    PT Stop Time 1440    PT Time Calculation (min) 40 min    Activity Tolerance Patient tolerated treatment well    Behavior During Therapy WFL for tasks assessed/performed             Past Medical History:  Diagnosis Date   Breast cancer    Hypertension    Past Surgical History:  Procedure Laterality Date   BREAST LUMPECTOMY WITH RADIOACTIVE SEED AND SENTINEL LYMPH NODE BIOPSY Right 09/15/2021   Procedure: RIGHT BREAST SEED BRACKETED LUMPECTOMY AND SENTINEL NODE BIOPSY;  Surgeon: Almond Lint, MD;  Location: [facility];  Service: General;  Laterality: Right;   cataract surgery Bilateral    DILATION AND CURETTAGE OF UTERUS  1970   EXCISION OF BREAST LESION Right 10/15/2021   Procedure: ASPIRATION RIGHT AXILLARY SEROMA;  Surgeon: Almond Lint, MD;  Location: [facility];  Service: General;  Laterality: Right;   eyelid tendon repair Bilateral    PORTACATH PLACEMENT N/A 09/15/2021   Procedure: PORT PLACEMENT;  Surgeon: Almond Lint, MD;  Location: [facility];  Service: General;  Laterality: N/A;   RE-EXCISION OF BREAST LUMPECTOMY Right 10/15/2021   Procedure: RE-EXCISION LUMPECTOMY RIGHT BREAST;  Surgeon: Almond Lint, MD;  Location: [facility];  Service: General;  Laterality: Right;   Patient Active Problem List   Diagnosis Date Noted   Port-A-Cath in place 11/18/2021   Genetic testing 09/28/2021   Malignant neoplasm of upper-outer quadrant of right breast in female, estrogen receptor positive 09/08/2021    PCP: Irven Coe, MDHammer, Theone Murdoch, MD  REFERRING PROVIDER: Olivia Mackie, MD   REFERRING DIAG:  N81.9 (ICD-10-CM) - Female genital prolapse, unspecified   THERAPY DIAG:  Abnormal posture  Muscle weakness (generalized)  Unspecified lack of coordination  Rationale for Evaluation and Treatment: Rehabilitation  ONSET DATE: 1 year  SUBJECTIVE:                                                                                                                                                                                           EVAL SUBJECTIVE STATEMENT: Pt states that she started having some dysuria and felt like there was a bulge in the vagina; this was worse  when she felt constipated. MD stated that prolapse is not severe. Estrogen cancer was estrogen positive. History of frequent bladder infections. Fluid intake: Yes: varies, tries to drink 64oz a day     PAIN:  Are you having pain? No   PRECAUTIONS: None  WEIGHT BEARING RESTRICTIONS: No  FALLS:  Has patient fallen in last 6 months? No  LIVING ENVIRONMENT: Lives with: lives with their family Lives in: House/apartment   OCCUPATION: works part time at pharmacy   PLOF: Independent  PATIENT GOALS: decrease sensation of vaginal bulge  PERTINENT HISTORY:  Breast cancer Sexual abuse: No  BOWEL MOVEMENT: Pain with bowel movement: No Type of bowel movement:Frequency daily and Strain No - not currently Fully empty rectum: Yes: - Leakage: No Pads: No Fiber supplement: No  URINATION: Pain with urination: Yes Fully empty bladder: No Stream: Strong once she gets going  Urgency: No Frequency: 3-4 hours during the day;  Leakage:  None Pads: No  INTERCOURSE: Pain with intercourse:  not sexually active since chemo  PREGNANCY: Vaginal deliveries 3 Tearing Yes: episiotomy   PROLAPSE: Cystocele vaginal bulge   OBJECTIVE:  08/04/22:  COGNITION: Overall cognitive status: Within functional limits for tasks assessed     SENSATION: Light touch: Appears intact Proprioception: Appears intact  FUNCTIONAL  TESTS: core weakness with sit up   GAIT: Comments: WNL  POSTURE: rounded shoulders, forward head, decreased lumbar lordosis, increased thoracic kyphosis, and posterior pelvic tilt   PALPATION:   General  no tenderness in abdomen, no scar tissue                External Perineal Exam dryness                             Internal Pelvic Floor no discomfort or restriction  Patient confirms identification and approves PT to assess internal pelvic floor and treatment Yes  PELVIC MMT:   MMT eval  Vaginal 3/5 strength, 5 second endurance, 6 repeat contractions in 10 seconds  Internal Anal Sphincter   External Anal Sphincter   Puborectalis   Diastasis Recti 2 finger width separation with distortion in above umbilicus  (Blank rows = not tested)        TONE: low  PROLAPSE: None detected upon exam; some small movement in urethra and anterior vaginal wall when she sat up; unable to coordinate appropriate bearing down  TODAY'S TREATMENT:                                                                                                                              DATE:  08/04/22  EVAL  Neuromuscular re-education: Pelvic floor contraction training Quick flicks Long holds Therapeutic activities: Squatty potty Relaxed toilet mechanics    PATIENT EDUCATION:  Education details: see above Person educated: Patient Education method: Explanation, Demonstration, Actor cues, Verbal cues, and Handouts Education comprehension: verbalized understanding  HOME EXERCISE PROGRAM: [id number]  ASSESSMENT:  CLINICAL IMPRESSION: Patient is a 82 y.o. female who was seen today for physical therapy evaluation and treatment for sensation of vaginal bulge. Exam findings notable for abdominal posture, pelvic floor weakness, decreased pelvic floor endurance, and core weakness; no palpable pelvic floor muscle bulge tested in supine today, but patient had difficulty with bearing down and contracted each  time preventing appropriate observation of vaginal wall laxity - more vaginal wall movement detected when she was asked to sit up. Signs and symptoms are most consistent with pelvic floor muscle weakness and poor abdominal pressure management. Initial treatment consisted of pelvic floor contraction training and better bowel movement technique. She will continue to benefit from skilled PT intervention in order to improve sensation of vaginal bulge, strengthening pelvic floor ,and improve abdominal pressure management.   OBJECTIVE IMPAIRMENTS: decreased activity tolerance, decreased coordination, decreased endurance, decreased strength, increased fascial restrictions, increased muscle spasms, impaired tone, postural dysfunction, and pain.   ACTIVITY LIMITATIONS:  will feel vaginal bulge during functional activity; bowel movements  PARTICIPATION LIMITATIONS: interpersonal relationship  PERSONAL FACTORS: 1 comorbidity: medical history  are also affecting patient's functional outcome.   REHAB POTENTIAL: Good  CLINICAL DECISION MAKING: Stable/uncomplicated  EVALUATION COMPLEXITY: Low   GOALS: Goals reviewed with patient? Yes  SHORT TERM GOALS: Target date: 09/15/22  Pt will be independent with HEP.   Baseline: Goal status: INITIAL  2.  Pt will be able to correctly perform diaphragmatic breathing and appropriate pressure management in order to prevent worsening vaginal wall laxity and improve pelvic floor A/ROM.   Baseline:  Goal status: INITIAL  3.  Pt will be able to teach back and utilize urge suppression technique in order to help reduce number of trips to the bathroom.    Baseline:  Goal status: INITIAL  4.  Pt will be independent with diaphragmatic breathing and down training activities in order to improve pelvic floor relaxation.  Baseline:  Goal status: INITIAL    LONG TERM GOALS: Target date: 10/13/2022   Pt will be independent with advanced HEP.   Baseline:  Goal  status: INITIAL  2.  Pt will be able to go 2-3 hours in between voids without urgency or incontinence in order to improve QOL and perform all functional activities with less difficulty.   Baseline:  Goal status: INITIAL  3.  Pt will decrease frequency of nightly trips to the bathroom to 1 or less in order to get restful sleep.   Baseline:  Goal status: INITIAL  4.  Pt will report 0/10 pain with vaginal penetration in order to improve intimate relationship with partner.    Baseline:  Goal status: INITIAL  5.  Pt will demonstrate normal pelvic floor muscle tone and A/ROM, able to achieve 4/5 strength with contractions and 10 sec endurance, in order to provide appropriate lumbopelvic support in functional activities.   Baseline:  Goal status: INITIAL   PLAN:  PT FREQUENCY: 1-2x/week  PT DURATION: 10 weeks  PLANNED INTERVENTIONS: Therapeutic exercises, Therapeutic activity, Neuromuscular re-education, Balance training, Gait training, Patient/Family education, Self Care, Joint mobilization, Dry Needling, Biofeedback, and Manual therapy  PLAN FOR NEXT SESSION: Begin core strengthening training; vaginal moisturizer; double voiding; pressure management.    Julio Alm, PT, DPT04/24/245:00 PM

## 2022-08-05 ENCOUNTER — Other Ambulatory Visit: Payer: Self-pay

## 2022-08-09 ENCOUNTER — Encounter: Payer: Self-pay | Admitting: *Deleted

## 2022-08-09 ENCOUNTER — Inpatient Hospital Stay: Payer: Medicare Other

## 2022-08-09 VITALS — BP 118/60 | HR 73 | Temp 97.9°F | Resp 16 | Wt 139.2 lb

## 2022-08-09 DIAGNOSIS — Z170 Estrogen receptor positive status [ER+]: Secondary | ICD-10-CM | POA: Diagnosis not present

## 2022-08-09 DIAGNOSIS — C50411 Malignant neoplasm of upper-outer quadrant of right female breast: Secondary | ICD-10-CM | POA: Diagnosis not present

## 2022-08-09 DIAGNOSIS — R42 Dizziness and giddiness: Secondary | ICD-10-CM | POA: Diagnosis not present

## 2022-08-09 DIAGNOSIS — R110 Nausea: Secondary | ICD-10-CM | POA: Diagnosis not present

## 2022-08-09 DIAGNOSIS — Z5112 Encounter for antineoplastic immunotherapy: Secondary | ICD-10-CM | POA: Diagnosis not present

## 2022-08-09 DIAGNOSIS — D649 Anemia, unspecified: Secondary | ICD-10-CM | POA: Diagnosis not present

## 2022-08-09 MED ORDER — TRASTUZUMAB-HYALURONIDASE-OYSK 600-10000 MG-UNT/5ML ~~LOC~~ SOLN
600.0000 mg | Freq: Once | SUBCUTANEOUS | Status: AC
  Administered 2022-08-09: 600 mg via SUBCUTANEOUS
  Filled 2022-08-09: qty 5

## 2022-08-09 MED ORDER — ACETAMINOPHEN 325 MG PO TABS
650.0000 mg | ORAL_TABLET | Freq: Once | ORAL | Status: AC
  Administered 2022-08-09: 650 mg via ORAL
  Filled 2022-08-09: qty 2

## 2022-08-09 NOTE — Patient Instructions (Signed)
[facility]  Discharge Instructions: Thank you for choosing [facility] to provide your oncology and hematology care.   If you have a lab appointment with the Cancer Center, please go directly to the Cancer Center and check in at the registration area.   Wear comfortable clothing and clothing appropriate for easy access to any Portacath or PICC line.   We strive to give you quality time with your provider. You may need to reschedule your appointment if you arrive late (15 or more minutes).  Arriving late affects you and other patients whose appointments are after yours.  Also, if you miss three or more appointments without notifying the office, you may be dismissed from the clinic at the provider's discretion.      For prescription refill requests, have your pharmacy contact our office and allow 72 hours for refills to be completed.    Today you received the following chemotherapy and/or immunotherapy agents: Herceptin Hylecta      To help prevent nausea and vomiting after your treatment, we encourage you to take your nausea medication as directed.  BELOW ARE SYMPTOMS THAT SHOULD BE REPORTED IMMEDIATELY: *FEVER GREATER THAN 100.4 F (38 C) OR HIGHER *CHILLS OR SWEATING *NAUSEA AND VOMITING THAT IS NOT CONTROLLED WITH YOUR NAUSEA MEDICATION *UNUSUAL SHORTNESS OF BREATH *UNUSUAL BRUISING OR BLEEDING *URINARY PROBLEMS (pain or burning when urinating, or frequent urination) *BOWEL PROBLEMS (unusual diarrhea, constipation, pain near the anus) TENDERNESS IN MOUTH AND THROAT WITH OR WITHOUT PRESENCE OF ULCERS (sore throat, sores in mouth, or a toothache) UNUSUAL RASH, SWELLING OR PAIN  UNUSUAL VAGINAL DISCHARGE OR ITCHING   Items with * indicate a potential emergency and should be followed up as soon as possible or go to the Emergency Department if any problems should occur.  Please show the CHEMOTHERAPY ALERT CARD or IMMUNOTHERAPY ALERT CARD  at check-in to the Emergency Department and triage nurse.  Should you have questions after your visit or need to cancel or reschedule your appointment, please contact [facility]  Dept: [phone]  and follow the prompts.  Office hours are 8:00 a.m. to 4:30 p.m. Monday - Friday. Please note that voicemails left after 4:00 p.m. may not be returned until the following business day.  We are closed weekends and major holidays. You have access to a nurse at all times for urgent questions. Please call the main number to the clinic Dept: [phone] and follow the prompts.   For any non-urgent questions, you may also contact your provider using MyChart. We now offer e-Visits for anyone 18 and older to request care online for non-urgent symptoms. For details visit [email or web address].   Also download the MyChart app! Go to the app store, search "MyChart", open the app, select [facility], and log in with your MyChart username and password.   

## 2022-08-10 ENCOUNTER — Ambulatory Visit: Payer: Medicare Other

## 2022-08-10 DIAGNOSIS — L814 Other melanin hyperpigmentation: Secondary | ICD-10-CM | POA: Diagnosis not present

## 2022-08-10 DIAGNOSIS — R293 Abnormal posture: Secondary | ICD-10-CM | POA: Diagnosis not present

## 2022-08-10 DIAGNOSIS — R279 Unspecified lack of coordination: Secondary | ICD-10-CM

## 2022-08-10 DIAGNOSIS — M6281 Muscle weakness (generalized): Secondary | ICD-10-CM | POA: Diagnosis not present

## 2022-08-10 DIAGNOSIS — L821 Other seborrheic keratosis: Secondary | ICD-10-CM | POA: Diagnosis not present

## 2022-08-10 DIAGNOSIS — Z86018 Personal history of other benign neoplasm: Secondary | ICD-10-CM | POA: Diagnosis not present

## 2022-08-10 DIAGNOSIS — D225 Melanocytic nevi of trunk: Secondary | ICD-10-CM | POA: Diagnosis not present

## 2022-08-10 NOTE — Patient Instructions (Signed)
Double-voiding:  This technique is to help with post-void dribbling, or leaking a little bit when you stand up right after urinating.  Use relaxed toileting mechanics to urinate as much as you feel like you have to without straining.  Sit back upright from leaning forward and relax this way for 10-20 seconds.  Lean forward again to finish voiding any amount more.    Vulvar/vaginal Massage: This is a technique to help decrease painful sensitivity in the vaginal area. It can also help to restore normal moisture levels in the vaginal tissues. With coconut oil, aloe, jojoba oil, or a specific vaginal moisturizer, gently massage into vaginal tissues. Think of this as part of your post-shower routine and moisturizing just like you would the rest of the body with lotion. This helps to increase good blood flow to the vaginal tissues. In addition, it also teaches the body that touch to the vagina does not have to be painful or threatening, but moisturizing and gentle.      [facility] [address] [place], [place] [address] Phone # [phone] Fax [phone]

## 2022-08-10 NOTE — Therapy (Signed)
OUTPATIENT PHYSICAL THERAPY TREATMENT NOTE   Patient Name: Rhonda Richardson MRN: [id number] DOB:09-Dec-1940, 82 y.o., female Today's Date: 08/10/2022  PCP: Irven Coe, MD REFERRING PROVIDER: Olivia Mackie, MD   END OF SESSION:   PT End of Session - 08/10/22 1536     Visit Number 2    Date for PT Re-Evaluation 10/13/22    Authorization Type Medicare    Progress Note Due on Visit 10    PT Start Time 1533    PT Stop Time 1611    PT Time Calculation (min) 38 min    Activity Tolerance Patient tolerated treatment well    Behavior During Therapy WFL for tasks assessed/performed             Past Medical History:  Diagnosis Date   Breast cancer (HCC)    Hypertension    Past Surgical History:  Procedure Laterality Date   BREAST LUMPECTOMY WITH RADIOACTIVE SEED AND SENTINEL LYMPH NODE BIOPSY Right 09/15/2021   Procedure: RIGHT BREAST SEED BRACKETED LUMPECTOMY AND SENTINEL NODE BIOPSY;  Surgeon: Almond Lint, MD;  Location: [facility];  Service: General;  Laterality: Right;   cataract surgery Bilateral    DILATION AND CURETTAGE OF UTERUS  1970   EXCISION OF BREAST LESION Right 10/15/2021   Procedure: ASPIRATION RIGHT AXILLARY SEROMA;  Surgeon: Almond Lint, MD;  Location: [facility];  Service: General;  Laterality: Right;   eyelid tendon repair Bilateral    PORTACATH PLACEMENT N/A 09/15/2021   Procedure: PORT PLACEMENT;  Surgeon: Almond Lint, MD;  Location: [facility];  Service: General;  Laterality: N/A;   RE-EXCISION OF BREAST LUMPECTOMY Right 10/15/2021   Procedure: RE-EXCISION LUMPECTOMY RIGHT BREAST;  Surgeon: Almond Lint, MD;  Location: [facility];  Service: General;  Laterality: Right;   Patient Active Problem List   Diagnosis Date Noted   Port-A-Cath in place 11/18/2021   Genetic testing 09/28/2021   Malignant neoplasm of upper-outer quadrant of right breast in female, estrogen receptor positive (HCC) 09/08/2021    REFERRING DIAG: N81.9  (ICD-10-CM) - Female genital prolapse, unspecified  THERAPY DIAG:  Abnormal posture  Muscle weakness (generalized)  Unspecified lack of coordination  Rationale for Evaluation and Treatment Rehabilitation  PERTINENT HISTORY: Breast cancer  PRECAUTIONS: NA  SUBJECTIVE:                                                                                                                                                                                      SUBJECTIVE STATEMENT:  Pt states that she is unsure if she has been doing contractions correctly.    PAIN:  Are you having pain? No  EVAL SUBJECTIVE STATEMENT: Pt states that she started having some dysuria and felt like there was a bulge in the vagina; this was worse when she felt constipated. MD stated that prolapse is not severe. Estrogen cancer was estrogen positive. History of frequent bladder infections. Fluid intake: Yes: varies, tries to drink 64oz a day    PAIN:  Are you having pain? No   PRECAUTIONS: None  WEIGHT BEARING RESTRICTIONS: No  FALLS:  Has patient fallen in last 6 months? No  LIVING ENVIRONMENT: Lives with: lives with their family Lives in: House/apartment   OCCUPATION: works part time at pharmacy   PLOF: Independent  PATIENT GOALS: decrease sensation of vaginal bulge  PERTINENT HISTORY:  Breast cancer Sexual abuse: No  BOWEL MOVEMENT: Pain with bowel movement: No Type of bowel movement:Frequency daily and Strain No - not currently Fully empty rectum: Yes: - Leakage: No Pads: No Fiber supplement: No  URINATION: Pain with urination: Yes Fully empty bladder: No Stream: Strong once she gets going  Urgency: No Frequency: 3-4 hours during the day;  Leakage: None Pads: No  INTERCOURSE: Pain with intercourse: not sexually active since chemo  PREGNANCY: Vaginal deliveries 3 Tearing Yes: episiotomy   PROLAPSE: Cystocele vaginal bulge   OBJECTIVE:  08/04/22:  COGNITION: Overall  cognitive status: Within functional limits for tasks assessed     SENSATION: Light touch: Appears intact Proprioception: Appears intact  FUNCTIONAL TESTS: core weakness with sit up   GAIT: Comments: WNL  POSTURE: rounded shoulders, forward head, decreased lumbar lordosis, increased thoracic kyphosis, and posterior pelvic tilt   PALPATION:   General  no tenderness in abdomen, no scar tissue                External Perineal Exam dryness                             Internal Pelvic Floor no discomfort or restriction  Patient confirms identification and approves PT to assess internal pelvic floor and treatment Yes  PELVIC MMT:   MMT eval  Vaginal 3/5 strength, 5 second endurance, 6 repeat contractions in 10 seconds  Internal Anal Sphincter   External Anal Sphincter   Puborectalis   Diastasis Recti 2 finger width separation with distortion in above umbilicus  (Blank rows = not tested)        TONE: low  PROLAPSE: None detected upon exam; some small movement in urethra and anterior vaginal wall when she sat up; unable to coordinate appropriate bearing down  TODAY'S TREATMENT:                                                                                                                              DATE:  08/10/22 Neuromuscular re-education: Pelvic floor muscle contractions Regular stance Wide stance Staggered stance Therapeutic activities: Vulvovaginal moisture Double voiding Orgasm Regular orgasm for vaginal/vulvar health Comfortable vibrator recommendation    08/04/22  EVAL  Neuromuscular re-education: Pelvic floor contraction training Quick flicks Long holds Therapeutic activities: Squatty potty Relaxed toilet mechanics    PATIENT EDUCATION:  Education details: see above Person educated: Patient Education method: Explanation, Demonstration, Tactile cues, Verbal cues, and Handouts Education comprehension: verbalized understanding  HOME EXERCISE  PROGRAM: [id number]   ASSESSMENT:  CLINICAL IMPRESSION: Pt is doing very well with pelvic floor contractions. When progressed to standing, she does an excellent job of maintaining good posture and avoiding gluteal isometric. She had good proprioception in various standing positions. We discussed it is safe and has some benefit to have regular orgasms. Education performed on vulvovaginal massage and double voiding. She will continue to benefit from skilled PT intervention in order to improve sensation of vaginal bulge, strengthening pelvic floor, and improve abdominal pressure management.   OBJECTIVE IMPAIRMENTS: decreased activity tolerance, decreased coordination, decreased endurance, decreased strength, increased fascial restrictions, increased muscle spasms, impaired tone, postural dysfunction, and pain.   ACTIVITY LIMITATIONS: will feel vaginal bulge during functional activity; bowel movements  PARTICIPATION LIMITATIONS: interpersonal relationship  PERSONAL FACTORS: 1 comorbidity: medical history are also affecting patient's functional outcome.   REHAB POTENTIAL: Good  CLINICAL DECISION MAKING: Stable/uncomplicated  EVALUATION COMPLEXITY: Low   GOALS: Goals reviewed with patient? Yes  SHORT TERM GOALS: Target date: 09/15/22  Pt will be independent with HEP.   Baseline: Goal status: INITIAL  2.  Pt will be able to correctly perform diaphragmatic breathing and appropriate pressure management in order to prevent worsening vaginal wall laxity and improve pelvic floor A/ROM.   Baseline:  Goal status: INITIAL  3.  Pt will be able to teach back and utilize urge suppression technique in order to help reduce number of trips to the bathroom.    Baseline:  Goal status: INITIAL  4.  Pt will be independent with diaphragmatic breathing and down training activities in order to improve pelvic floor relaxation.  Baseline:  Goal status: INITIAL    LONG TERM GOALS: Target date:  10/13/2022   Pt will be independent with advanced HEP.   Baseline:  Goal status: INITIAL  2.  Pt will be able to go 2-3 hours in between voids without urgency or incontinence in order to improve QOL and perform all functional activities with less difficulty.   Baseline:  Goal status: INITIAL  3.  Pt will decrease frequency of nightly trips to the bathroom to 1 or less in order to get restful sleep.   Baseline:  Goal status: INITIAL  4.  Pt will report 0/10 pain with vaginal penetration in order to improve intimate relationship with partner.    Baseline:  Goal status: INITIAL  5.  Pt will demonstrate normal pelvic floor muscle tone and A/ROM, able to achieve 4/5 strength with contractions and 10 sec endurance, in order to provide appropriate lumbopelvic support in functional activities.   Baseline:  Goal status: INITIAL   PLAN:  PT FREQUENCY: 1-2x/week  PT DURATION: 10 weeks  PLANNED INTERVENTIONS: Therapeutic exercises, Therapeutic activity, Neuromuscular re-education, Balance training, Gait training, Patient/Family education, Self Care, Joint mobilization, Dry Needling, Biofeedback, and Manual therapy  PLAN FOR NEXT SESSION: Core training and progressions.   Julio Alm, PT, DPT04/30/244:17 PM

## 2022-08-17 ENCOUNTER — Ambulatory Visit
Admission: RE | Admit: 2022-08-17 | Discharge: 2022-08-17 | Disposition: A | Discharge status: Home / Self Care | Payer: Medicare Other | Source: Ambulatory Visit | Attending: Hematology and Oncology | Admitting: Hematology and Oncology

## 2022-08-17 DIAGNOSIS — C50411 Malignant neoplasm of upper-outer quadrant of right female breast: Secondary | ICD-10-CM

## 2022-08-17 DIAGNOSIS — Z170 Estrogen receptor positive status [ER+]: Secondary | ICD-10-CM | POA: Diagnosis not present

## 2022-08-17 DIAGNOSIS — Z1231 Encounter for screening mammogram for malignant neoplasm of breast: Secondary | ICD-10-CM | POA: Diagnosis not present

## 2022-08-18 ENCOUNTER — Ambulatory Visit (HOSPITAL_COMMUNITY)
Admission: RE | Admit: 2022-08-18 | Discharge: 2022-08-18 | Disposition: A | Discharge status: Home / Self Care | Payer: Medicare Other | Source: Ambulatory Visit | Attending: Internal Medicine | Admitting: Internal Medicine

## 2022-08-18 ENCOUNTER — Encounter (HOSPITAL_COMMUNITY): Payer: Self-pay | Admitting: Internal Medicine

## 2022-08-18 ENCOUNTER — Ambulatory Visit (HOSPITAL_BASED_OUTPATIENT_CLINIC_OR_DEPARTMENT_OTHER)
Admission: RE | Admit: 2022-08-18 | Discharge: 2022-08-18 | Disposition: A | Discharge status: Home / Self Care | Payer: Medicare Other | Source: Ambulatory Visit | Attending: Internal Medicine | Admitting: Internal Medicine

## 2022-08-18 VITALS — BP 140/70 | HR 74 | Wt 138.8 lb

## 2022-08-18 DIAGNOSIS — I110 Hypertensive heart disease with heart failure: Secondary | ICD-10-CM | POA: Insufficient documentation

## 2022-08-18 DIAGNOSIS — Z79899 Other long term (current) drug therapy: Secondary | ICD-10-CM

## 2022-08-18 DIAGNOSIS — R931 Abnormal findings on diagnostic imaging of heart and coronary circulation: Secondary | ICD-10-CM

## 2022-08-18 DIAGNOSIS — I10 Essential (primary) hypertension: Secondary | ICD-10-CM | POA: Diagnosis not present

## 2022-08-18 DIAGNOSIS — Z170 Estrogen receptor positive status [ER+]: Secondary | ICD-10-CM

## 2022-08-18 DIAGNOSIS — C50911 Malignant neoplasm of unspecified site of right female breast: Secondary | ICD-10-CM | POA: Insufficient documentation

## 2022-08-18 DIAGNOSIS — I509 Heart failure, unspecified: Secondary | ICD-10-CM | POA: Insufficient documentation

## 2022-08-18 DIAGNOSIS — C50411 Malignant neoplasm of upper-outer quadrant of right female breast: Secondary | ICD-10-CM | POA: Diagnosis not present

## 2022-08-18 DIAGNOSIS — Z5181 Encounter for therapeutic drug level monitoring: Secondary | ICD-10-CM | POA: Diagnosis not present

## 2022-08-18 DIAGNOSIS — I083 Combined rheumatic disorders of mitral, aortic and tricuspid valves: Secondary | ICD-10-CM | POA: Diagnosis not present

## 2022-08-18 LAB — ECHOCARDIOGRAM COMPLETE
AR max vel: 2.35 cm2
AV Area VTI: 2.75 cm2
AV Area mean vel: 2.51 cm2
AV Mean grad: 3 mmHg
AV Peak grad: 5.9 mmHg
Ao pk vel: 1.22 m/s
Area-P 1/2: 3.42 cm2
Calc EF: 64.3 %
S' Lateral: 3.2 cm
Single Plane A2C EF: 65.1 %
Single Plane A4C EF: 63.7 %

## 2022-08-18 NOTE — Progress Notes (Signed)
CARDIO-ONCOLOGY CLINIC NOTE  Referring Physician: Dr. Al Pimple Primary Care: Irven Coe, MD Primary Cardiologist: New  HPI:  Rhonda Richardson is 82 y.o. female with HTN, right breast cancer referred by Dr. Al Pimple for further evaluation of abnormal echo.   Diagnosed with R breast CA in 5/23 ER + PR - HER2 +. In 6/23 had lumpectomy with positive margins. LNs clear. 7/23 re-excision of margins.   In 8/23 stated chemo with docetaxel + Carboplatin + Trastuzumab (TCH) q21d / Trastuzumab q21d.   Switched to Herceptin alone in 12/23 -> getting q3weeks  XRT completed in 1/24 (20 treatments)  Recently started arimidex.   Echo 5/23 EF 55-65% GLS -23.1% Echo 9/23 EF 60-65% no GLS due to poor tracking Echo 03/29/22 EF 60-65% GLS -21.6%  Echo 3/24 50-55% EF 50-55% read as anteroseptal and inferoseptal HK GLS -15.6% (likely underestimated) -> Herceptin held briefly  I saw her in March due to decreased EF on echo. I felt EF normal and hercpeptin restarted. Returns today for repeat echo  Feels great. Denies CP or SOB. Remains on Herceptin Not exercising regularly. + Fatigue. Chasing her 2 yo grandson around. Mild edema at end of day. Improves with HCTZ.   Echo today 08/18/22: EF 55-60% GLS -16.7% (underestimated due to poor endocardial tracking)   Family history positive for heart disease: M had MI at 80 y/o died in 63s F died at 50 from MI/HF (smoker) MGF died at 80 y/o with MI B possible MI in 30s died in 47s during stress test (morbidly obese) B + heart disease (obese smoker)   Past Medical History:  Diagnosis Date   Breast cancer (HCC)    Hypertension     Current Outpatient Medications  Medication Sig Dispense Refill   acetaminophen (TYLENOL) 325 MG tablet Take 325 mg by mouth every 6 (six) hours as needed for moderate pain.     anastrozole (ARIMIDEX) 1 MG tablet Take 1 mg by mouth daily.     benazepril (LOTENSIN) 20 MG tablet Take 20 mg by mouth daily.     cholecalciferol (VITAMIN D3)  25 MCG (1000 UNIT) tablet Take 1,000 Units by mouth daily.     famotidine (PEPCID) 20 MG tablet Take 20 mg by mouth daily as needed for heartburn or indigestion.     hydrochlorothiazide (MICROZIDE) 12.5 MG capsule Take 12.5 mg by mouth daily.     ibuprofen (ADVIL) 200 MG tablet Take 200 mg by mouth every 6 (six) hours as needed for moderate pain.     iron polysaccharides (NIFEREX) 150 MG capsule Take 150 mg by mouth 3 (three) times a week. M W F     lidocaine-prilocaine (EMLA) cream Apply to affected area once External for 30 Days     Magnesium 250 MG TABS Take 250 mg by mouth daily.     Probiotic Product (PROBIOTIC DAILY PO) Take 1 tablet by mouth daily.     Red Yeast Rice Extract 600 MG CAPS 2 capsule Orally once a day     Vitamins-Lipotropics (MULTI-VITAMIN HP/MINERALS PO) Take 1 tablet by mouth daily.     No current facility-administered medications for this encounter.    Allergies  Allergen Reactions   Macrodantin [Nitrofurantoin] Hives      Social History   Socioeconomic History   Marital status: Married    Spouse name: Not on file   Number of children: Not on file   Years of education: Not on file   Highest education level: Not on file  Occupational History   Not on file  Tobacco Use   Smoking status: Never   Smokeless tobacco: Not on file  Vaping Use   Vaping Use: Never used  Substance and Sexual Activity   Alcohol use: Yes    Comment: 3- 4 drinks   Drug use: Never   Sexual activity: Not Currently  Other Topics Concern   Not on file  Social History Narrative   Not on file   Social Determinants of Health   Financial Resource Strain: Low Risk  (09/09/2021)   Overall Financial Resource Strain (CARDIA)    Difficulty of Paying Living Expenses: Not hard at all  Food Insecurity: No Food Insecurity (09/09/2021)   Hunger Vital Sign    Worried About Running Out of Food in the Last Year: Never true    Ran Out of Food in the Last Year: Never true  Transportation Needs:  No Transportation Needs (09/09/2021)   PRAPARE - Administrator, Civil Service (Medical): No    Lack of Transportation (Non-Medical): No  Physical Activity: Not on file  Stress: Not on file  Social Connections: Not on file  Intimate Partner Violence: Not on file      Family History  Problem Relation Age of Onset   Heart disease Father 67   Thyroid cancer Other 14       niece's daughter    Vitals:   08/18/22 1032  BP: (!) 140/70  Pulse: 74  SpO2: 98%  Weight: 63 kg (138 lb 12.8 oz)     PHYSICAL EXAM: General:  Well appearing. No respiratory difficulty HEENT: normal Neck: supple. no JVD. Carotids 2+ bilat; no bruits. No lymphadenopathy or thryomegaly appreciated. Cor: PMI nondisplaced. Regular rate & rhythm. 2/6 SEM S2 ok  Lungs: clear Abdomen: soft, nontender, nondistended. No hepatosplenomegaly. No bruits or masses. Good bowel sounds. Extremities: no cyanosis, clubbing, rash, edema Neuro: alert & oriented x 3, cranial nerves grossly intact. moves all 4 extremities w/o difficulty. Affect pleasant.  ASSESSMENT & PLAN:  1. Abnormal echo - Echo 5/23 EF 55-60% GLS -23.1% - Echo 9/23 EF 60-65% no GLS due to poor tracking - Echo 03/29/22 EF 55-60% GLS -21.6%  - Echo 3/24 50-55% EF 50-55% read as anteroseptal and inferoseptal HK GLS -15.6% (likely underestimated) - I have reviewed all echos. In looking at echo in 3/24, I think EF 55-60% and don't see any convincing WMAs. Also the timeline of change does not fit with Herceptin CM - At this point would continue Herceptin and repeat echo in 1 month - Given very strong FHx of CAD and question of abnormal echo, will proceed with CAC to further evaluate for obstructive CAD - Echo today 08/18/22: EF 55-60% GLS -16.7% (underestimated due to poor endocardial tracking)  - Continue Herceptin. (Finishes in 8/24)   2. Breast Cancer - plan as above - Continue Herceptin. (Finishes in 8/24)   3. HTN - SBP ranges 120-130s at  home - PCP following  Arvilla Meres, MD  11:14 AM

## 2022-08-18 NOTE — Addendum Note (Signed)
Encounter addended by: Linda Hedges, RN on: 08/18/2022 11:33 AM  Actions taken: Visit diagnoses modified, Order list changed, Diagnosis association updated, Clinical Note Signed

## 2022-08-18 NOTE — Patient Instructions (Signed)
There has been no changes to your medications.  Your physician has requested that you have an echocardiogram. Echocardiography is a painless test that uses sound waves to create images of your heart. It provides your doctor with information about the size and shape of your heart and how well your heart's chambers and valves are working. This procedure takes approximately one hour. There are no restrictions for this procedure. Please do NOT wear cologne, perfume, aftershave, or lotions (deodorant is allowed). Please arrive 15 minutes prior to your appointment time.  Your provider has ordered a Calcium score text. It is a self pay exam of $99   Your physician recommends that you schedule a follow-up appointment in: 3 months  If you have any questions or concerns before your next appointment please send [place] a message through [place] or call our office at [phone].    TO LEAVE A MESSAGE FOR THE NURSE SELECT OPTION 2, PLEASE LEAVE A MESSAGE INCLUDING: YOUR NAME DATE OF BIRTH CALL BACK NUMBER REASON FOR CALL**this is important as we prioritize the call backs  YOU WILL RECEIVE A CALL BACK THE SAME DAY AS LONG AS YOU CALL BEFORE 4:00 PM  At the [facility], you and your health needs are our priority. As part of our continuing mission to provide you with exceptional heart care, we have created designated Provider Care Teams. These Care Teams include your primary Cardiologist (physician) and Advanced Practice Providers (APPs- Physician Assistants and Nurse Practitioners) who all work together to provide you with the care you need, when you need it.   You may see any of the following providers on your designated Care Team at your next follow up: Dr Arvilla Meres Dr Marca Ancona Dr. Marcos Eke, NP Robbie Lis, [place] [facility] [place], [place] Brynda Peon, NP Karle Plumber, PharmD   Please be sure to bring in all your medications bottles to every  appointment.    Thank you for choosing [facility]

## 2022-08-19 ENCOUNTER — Other Ambulatory Visit: Payer: Self-pay

## 2022-08-25 DIAGNOSIS — H9319 Tinnitus, unspecified ear: Secondary | ICD-10-CM | POA: Diagnosis not present

## 2022-08-25 DIAGNOSIS — Z853 Personal history of malignant neoplasm of breast: Secondary | ICD-10-CM | POA: Diagnosis not present

## 2022-08-25 DIAGNOSIS — I10 Essential (primary) hypertension: Secondary | ICD-10-CM | POA: Diagnosis not present

## 2022-08-25 DIAGNOSIS — N8110 Cystocele, unspecified: Secondary | ICD-10-CM | POA: Diagnosis not present

## 2022-08-25 DIAGNOSIS — D649 Anemia, unspecified: Secondary | ICD-10-CM | POA: Diagnosis not present

## 2022-08-25 DIAGNOSIS — E7800 Pure hypercholesterolemia, unspecified: Secondary | ICD-10-CM | POA: Diagnosis not present

## 2022-08-25 DIAGNOSIS — K219 Gastro-esophageal reflux disease without esophagitis: Secondary | ICD-10-CM | POA: Diagnosis not present

## 2022-08-25 DIAGNOSIS — Z8249 Family history of ischemic heart disease and other diseases of the circulatory system: Secondary | ICD-10-CM | POA: Diagnosis not present

## 2022-08-25 DIAGNOSIS — E559 Vitamin D deficiency, unspecified: Secondary | ICD-10-CM | POA: Diagnosis not present

## 2022-08-25 DIAGNOSIS — M810 Age-related osteoporosis without current pathological fracture: Secondary | ICD-10-CM | POA: Diagnosis not present

## 2022-08-27 ENCOUNTER — Encounter: Payer: Self-pay | Admitting: Hematology and Oncology

## 2022-08-30 ENCOUNTER — Inpatient Hospital Stay: Payer: Medicare Other | Attending: Hematology and Oncology

## 2022-08-30 ENCOUNTER — Inpatient Hospital Stay: Payer: Medicare Other

## 2022-08-30 ENCOUNTER — Inpatient Hospital Stay (HOSPITAL_BASED_OUTPATIENT_CLINIC_OR_DEPARTMENT_OTHER): Payer: Medicare Other | Admitting: Adult Health

## 2022-08-30 ENCOUNTER — Encounter: Payer: Self-pay | Admitting: Adult Health

## 2022-08-30 VITALS — BP 141/72 | HR 72 | Temp 97.5°F | Resp 18 | Ht 62.0 in | Wt 139.3 lb

## 2022-08-30 VITALS — BP 147/69 | HR 78 | Resp 16

## 2022-08-30 DIAGNOSIS — K219 Gastro-esophageal reflux disease without esophagitis: Secondary | ICD-10-CM | POA: Insufficient documentation

## 2022-08-30 DIAGNOSIS — I10 Essential (primary) hypertension: Secondary | ICD-10-CM | POA: Insufficient documentation

## 2022-08-30 DIAGNOSIS — Z5112 Encounter for antineoplastic immunotherapy: Secondary | ICD-10-CM | POA: Insufficient documentation

## 2022-08-30 DIAGNOSIS — Z170 Estrogen receptor positive status [ER+]: Secondary | ICD-10-CM

## 2022-08-30 DIAGNOSIS — R300 Dysuria: Secondary | ICD-10-CM | POA: Insufficient documentation

## 2022-08-30 DIAGNOSIS — Z86018 Personal history of other benign neoplasm: Secondary | ICD-10-CM | POA: Diagnosis not present

## 2022-08-30 DIAGNOSIS — K5900 Constipation, unspecified: Secondary | ICD-10-CM | POA: Insufficient documentation

## 2022-08-30 DIAGNOSIS — Z79899 Other long term (current) drug therapy: Secondary | ICD-10-CM | POA: Insufficient documentation

## 2022-08-30 DIAGNOSIS — H903 Sensorineural hearing loss, bilateral: Secondary | ICD-10-CM | POA: Insufficient documentation

## 2022-08-30 DIAGNOSIS — Z79811 Long term (current) use of aromatase inhibitors: Secondary | ICD-10-CM | POA: Diagnosis not present

## 2022-08-30 DIAGNOSIS — Z95828 Presence of other vascular implants and grafts: Secondary | ICD-10-CM

## 2022-08-30 DIAGNOSIS — C50411 Malignant neoplasm of upper-outer quadrant of right female breast: Secondary | ICD-10-CM

## 2022-08-30 DIAGNOSIS — Z808 Family history of malignant neoplasm of other organs or systems: Secondary | ICD-10-CM | POA: Insufficient documentation

## 2022-08-30 DIAGNOSIS — Z8249 Family history of ischemic heart disease and other diseases of the circulatory system: Secondary | ICD-10-CM | POA: Insufficient documentation

## 2022-08-30 LAB — CMP (CANCER CENTER ONLY)
ALT: 33 U/L (ref 0–44)
AST: 27 U/L (ref 15–41)
Albumin: 4 g/dL (ref 3.5–5.0)
Alkaline Phosphatase: 65 U/L (ref 38–126)
Anion gap: 6 (ref 5–15)
BUN: 25 mg/dL — ABNORMAL HIGH (ref 8–23)
CO2: 28 mmol/L (ref 22–32)
Calcium: 9 mg/dL (ref 8.9–10.3)
Chloride: 105 mmol/L (ref 98–111)
Creatinine: 0.86 mg/dL (ref 0.44–1.00)
GFR, Estimated: >60 mL/min (ref >60)
Glucose, Bld: 94 mg/dL (ref 70–99)
Potassium: 4.1 mmol/L (ref 3.5–5.1)
Sodium: 139 mmol/L (ref 135–145)
Total Bilirubin: 0.4 mg/dL (ref 0.3–1.2)
Total Protein: 6.6 g/dL (ref 6.5–8.1)

## 2022-08-30 LAB — CBC WITH DIFFERENTIAL (CANCER CENTER ONLY)
Abs Immature Granulocytes: 0.01 10*3/uL (ref 0.00–0.07)
Basophils Absolute: 0 10*3/uL (ref 0.0–0.1)
Basophils Relative: 1 %
Eosinophils Absolute: 0.2 10*3/uL (ref 0.0–0.5)
Eosinophils Relative: 5 %
HCT: 33.7 % — ABNORMAL LOW (ref 36.0–46.0)
Hemoglobin: 11 g/dL — ABNORMAL LOW (ref 12.0–15.0)
Immature Granulocytes: 0 %
Lymphocytes Relative: 34 %
Lymphs Abs: 1.3 10*3/uL (ref 0.7–4.0)
MCH: 26.4 pg (ref 26.0–34.0)
MCHC: 32.6 g/dL (ref 30.0–36.0)
MCV: 81 fL (ref 80.0–100.0)
Monocytes Absolute: 0.3 10*3/uL (ref 0.1–1.0)
Monocytes Relative: 7 %
Neutro Abs: 2 10*3/uL (ref 1.7–7.7)
Neutrophils Relative %: 53 %
Platelet Count: 230 10*3/uL (ref 150–400)
RBC: 4.16 MIL/uL (ref 3.87–5.11)
RDW: 15.9 % — ABNORMAL HIGH (ref 11.5–15.5)
WBC Count: 3.8 10*3/uL — ABNORMAL LOW (ref 4.0–10.5)
nRBC: 0 % (ref 0.0–0.2)

## 2022-08-30 MED ORDER — TRASTUZUMAB-HYALURONIDASE-OYSK 600-10000 MG-UNT/5ML ~~LOC~~ SOLN
600.0000 mg | Freq: Once | SUBCUTANEOUS | Status: AC
  Administered 2022-08-30: 600 mg via SUBCUTANEOUS
  Filled 2022-08-30: qty 5

## 2022-08-30 MED ORDER — HEPARIN SOD (PORK) LOCK FLUSH 100 UNIT/ML IV SOLN
500.0000 [IU] | Freq: Once | INTRAVENOUS | Status: AC
  Administered 2022-08-30: 500 [IU]

## 2022-08-30 MED ORDER — SODIUM CHLORIDE 0.9% FLUSH
10.0000 mL | Freq: Once | INTRAVENOUS | Status: AC
  Administered 2022-08-30: 10 mL

## 2022-08-30 MED ORDER — ACETAMINOPHEN 325 MG PO TABS
650.0000 mg | ORAL_TABLET | Freq: Once | ORAL | Status: AC
  Administered 2022-08-30: 650 mg via ORAL
  Filled 2022-08-30: qty 2

## 2022-08-30 NOTE — Assessment & Plan Note (Signed)
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    Current treatment: Herceptin and Anastrozole  Right breast stage IA IDC: She will continue on Herceptin and anastrozole.  She is tolerating these well.  She has no clinical or radiographic signs of breast cancer recurrence.  Her next mammogram will be due in May 2025. At risk for heart failure: Most recent echo: 08/18/2022-EF 55-60% (followed by Dr. Gala Romney) Vaginal dryness causing dysuria: This is being managed with pelvic rehab which is working well. Bone health: She has a history of osteoporosis and is taking vitamin D and calcium.  Her next bone density testing is scheduled for October 12, 2022.  Rhonda Richardson will continue with her current treatment.  We will see her back in 3 weeks for Herceptin alone and in 6 weeks for labs, follow-up, and treatment.

## 2022-08-30 NOTE — Patient Instructions (Addendum)
[facility] [place] [facility]  Discharge Instructions: Thank you for choosing [facility] to provide your oncology and hematology care.   If you have a lab appointment with the Cancer Center, please go directly to the Cancer Center and check in at the registration area.   Wear comfortable clothing and clothing appropriate for easy access to any Portacath or PICC line.   We strive to give you quality time with your provider. You may need to reschedule your appointment if you arrive late (15 or more minutes).  Arriving late affects you and other patients whose appointments are after yours.  Also, if you miss three or more appointments without notifying the office, you may be dismissed from the clinic at the provider's discretion.      For prescription refill requests, have your pharmacy contact our office and allow 72 hours for refills to be completed.    Today you received the following chemotherapy and/or immunotherapy agents: trastuzumab-hyaluronidase      To help prevent nausea and vomiting after your treatment, we encourage you to take your nausea medication as directed.  BELOW ARE SYMPTOMS THAT SHOULD BE REPORTED IMMEDIATELY: *FEVER GREATER THAN 100.4 F (38 C) OR HIGHER *CHILLS OR SWEATING *NAUSEA AND VOMITING THAT IS NOT CONTROLLED WITH YOUR NAUSEA MEDICATION *UNUSUAL SHORTNESS OF BREATH *UNUSUAL BRUISING OR BLEEDING *URINARY PROBLEMS (pain or burning when urinating, or frequent urination) *BOWEL PROBLEMS (unusual diarrhea, constipation, pain near the anus) TENDERNESS IN MOUTH AND THROAT WITH OR WITHOUT PRESENCE OF ULCERS (sore throat, sores in mouth, or a toothache) UNUSUAL RASH, SWELLING OR PAIN  UNUSUAL VAGINAL DISCHARGE OR ITCHING   Items with * indicate a potential emergency and should be followed up as soon as possible or go to the Emergency Department if any problems should occur.  Please show the CHEMOTHERAPY ALERT CARD or IMMUNOTHERAPY  ALERT CARD at check-in to the Emergency Department and triage nurse.  Should you have questions after your visit or need to cancel or reschedule your appointment, please contact [place] [facility]  Dept: [phone]  and follow the prompts.  Office hours are 8:00 a.m. to 4:30 p.m. Monday - Friday. Please note that voicemails left after 4:00 p.m. may not be returned until the following business day.  We are closed weekends and major holidays. You have access to a nurse at all times for urgent questions. Please call the main number to the clinic Dept: [phone] and follow the prompts.   For any non-urgent questions, you may also contact your provider using MyChart. We now offer e-Visits for anyone 65 and older to request care online for non-urgent symptoms. For details visit [email or web address].   Also download the MyChart app! Go to the app store, search "MyChart", open the app, select [place], and log in with your MyChart username and password.  Trastuzumab; Hyaluronidase Injection What is this medication? TRASTUZUMAB; HYALURONIDASE (tras TOO zoo mab; hye al ur ON i dase) treats breast cancer. Trastuzumab works by blocking a protein that causes cancer cells to grow and multiply. This helps to slow or stop the spread of cancer cells. Hyaluronidase works by increasing the absorption of other medications in the body to help them work better. It is a combination medication that contains a monoclonal antibody. This medicine may be used for other purposes; ask your health care provider or pharmacist if you have questions. COMMON BRAND NAME(S): HERCEPTIN HYLECTA What should I tell my care team before  I take this medication? They need to know if you have any of these conditions: Heart failure Lung disease An unusual or allergic reaction to trastuzumab, or other medications, foods, dyes, or preservatives Pregnant or trying to get pregnant Breast-feeding How  should I use this medication? This medication is injected under the skin. It is given by your care team in a hospital or clinic setting. Talk to your care team about the use of this medication in children. It is not approved for use in children. Overdosage: If you think you have taken too much of this medicine contact a poison control center or emergency room at once. NOTE: This medicine is only for you. Do not share this medicine with others. What if I miss a dose? Keep appointments for follow-up doses. It is important not to miss your dose. Call your care team if you are unable to keep an appointment. What may interact with this medication? Certain types of chemotherapy, such as daunorubicin, doxorubicin, epirubicin, idarubicin This list may not describe all possible interactions. Give your health care provider a list of all the medicines, herbs, non-prescription drugs, or dietary supplements you use. Also tell them if you smoke, drink alcohol, or use illegal drugs. Some items may interact with your medicine. What should I watch for while using this medication? Your condition will be monitored carefully while you are receiving this medication. This medication may make you feel generally unwell. This is not uncommon as chemotherapy can affect healthy cells as well as cancer cells. Report any side effects. Continue your course of treatment even though you feel ill unless your care team tells you to stop. This medication may increase your risk of getting an infection. Call your care team for advice if you get a fever, chills, sore throat, or other symptoms of a cold or flu. Do not treat yourself. Try to avoid being around people who are sick. Avoid taking medications that contain aspirin, acetaminophen, ibuprofen, naproxen, or ketoprofen unless instructed by your care team. These medications may hide a fever. Talk to your care team if you may be pregnant. Serious birth defects can occur if you take  this medication during pregnancy and for 7 months after the last dose. You will need a negative pregnancy test before starting this medication. Contraception is recommended while taking this medication and for 7 months after the last dose. Your care team can help you find the option that works for you. Do not breastfeed while taking this medication or for 7 months after the last dose. What side effects may I notice from receiving this medication? Side effects that you should report to your care team as soon as possible: Allergic reactions or angioedema--skin rash, itching or hives, swelling of the face, eyes, lips, tongue, arms, or legs, trouble swallowing or breathing Dry cough, shortness of breath or trouble breathing Heart failure--shortness of breath, swelling of the ankles, feet, or hands, sudden weight gain, unusual weakness or fatigue Heart rhythm changes--fast or irregular heartbeat, dizziness, feeling faint or lightheaded, chest pain, trouble breathing Increase in blood pressure Infection--fever, chills, cough, or sore throat Side effects that usually do not require medical attention (report these to your care team if they continue or are bothersome): Diarrhea Hair loss Headache Muscle pain Nausea Unusual weakness or fatigue This list may not describe all possible side effects. Call your doctor for medical advice about side effects. You may report side effects to FDA at 1-800-FDA-1088. Where should I keep my medication? This medication  is given in a hospital or clinic. It will not be stored at home. NOTE: This sheet is a summary. It may not cover all possible information. If you have questions about this medicine, talk to your doctor, pharmacist, or health care provider.  2023 Elsevier/Gold Standard (2021-08-11 00:00:00)

## 2022-08-30 NOTE — Progress Notes (Signed)
[facility] Cancer Follow up:    Rhonda Coe, MD [address] [place] [address] [place] [address]   DIAGNOSIS:  Cancer Staging  Malignant neoplasm of upper-outer quadrant of right breast in female, estrogen receptor positive (HCC) Staging form: Breast, AJCC 8th Edition - Clinical stage from 09/09/2021: Stage IA (cT1c, cN0, cM0, G2, ER+, PR+, HER2+) - Unsigned Stage prefix: Initial diagnosis Histologic grading system: 3 grade system - Pathologic stage from 09/15/2021: Stage IA (pT2, pN0, cM0, G1, ER+, PR+, HER2+) - Signed by Loa Socks, NP on 08/30/2022 Histologic grading system: 3 grade system   SUMMARY OF ONCOLOGIC HISTORY: Oncology History  Malignant neoplasm of upper-outer quadrant of right breast in female, estrogen receptor positive (HCC)  08/12/2021 Mammogram   Mammogram showed possible mass in the right breast.  Diagnostic mammogram showed 2 highly suspicious upper outer right breast masses measuring 1.5 cm in the 9:30 position and 1.4 cm at the 10 o'clock position.  These masses or distortions mammographically spanning a distance of up to 3.5 cm.  No abnormal appearing right axillary lymph nodes.   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  Prognostic showed ER 99% positive strong staining PR 10% positive moderate staining, HER2 positive 3+ and Ki-67 of 30%.   09/15/2021 Surgery   Right lumpectomy [facility]): IDC, grade 1, 3.6cm, margins +, 4 SLN negative.  Prior prognostic panel: ER 99%, PR 10%, HER2 +   09/23/2021 Genetic Testing   Negative hereditary cancer genetic testing: no pathogenic variants detected in [facility] CustomNext-cancer +RNAinsight Panel.  Report date is September 23, 2021.   The CustomNext-Cancer+RNAinsight panel offered by [employer] includes sequencing and rearrangement analysis for the following 47 genes:  APC, ATM, AXIN2, BARD1, BMPR1A, BRCA1, BRCA2, BRIP1, CDH1, CDK4,  CDKN2A, CHEK2, DICER1, EPCAM, GREM1, HOXB13, MEN1, MLH1, MSH2, MSH3, MSH6, MUTYH, NBN, NF1, NF2, NTHL1, PALB2, PMS2, POLD1, POLE, PTEN, RAD51C, RAD51D, RECQL, RET, SDHA, SDHAF2, SDHB, SDHC, SDHD, SMAD4, SMARCA4, STK11, TP53, TSC1, TSC2, and VHL.  RNA data is routinely analyzed for use in variant interpretation for all genes.   10/15/2021 Surgery   Reexcision cleared margins.   11/10/2021 -  Chemotherapy   Patient is on Treatment Plan : BREAST Docetaxel + Carboplatin + Trastuzumab (TCH) q21d / Trastuzumab q21d     03/30/2022 - 04/28/2022 Radiation Therapy   Site Technique Total Dose (Gy) Dose per Fx (Gy) Completed Fx Beam Energies  Breast, Right: Breast_R 3D 42.56/42.56 2.66 16/16 6X, 10X  Breast, Right: Breast_R_Bst 3D 8/8 2 4/4 6X, 10X     05/2022 -  Anti-estrogen oral therapy   Anastrozole daily     CURRENT THERAPY: Herceptin/Anastrozole  INTERVAL HISTORY: Rhonda Richardson 82 y.o. female returns for f/u prior to receiving maintenance every 3 week Herceptin.  She is tolerating this well.  She is also taking anastrozole daily with good tolerance.  She was experiencing mild dysuria and constipation and was referred to pelvic rehab which has helped her tremendously.  She denies any arthralgias or hot flashes.  On Aug 12, 2022 she underwent bilateral breast diagnostic mammogram that demonstrated no mammographic evidence of malignancy and breast density category B.  Her most recent echo occurred on 08/18/2022 and demonstrated a LVEF of 55-60%.  Her next echo is scheduled on 11/30/2022.  Her upcoming bone density testing will occur on October 12, 2022.  Her most recent bone density was consistent with osteoporosis with a T-score of -  3.0.   Patient Active Problem List   Diagnosis Date Noted   Family history of ischemic heart disease (IHD) 08/30/2022   History of dysplastic nevus 08/30/2022   Port-A-Cath in place 11/18/2021   Genetic testing 09/28/2021   Essential hypertension 09/09/2021   Malignant  neoplasm of upper-outer quadrant of right breast in female, estrogen receptor positive (HCC) 09/08/2021   Gastroesophageal reflux disease 04/08/2016   Sensorineural hearing loss (SNHL), bilateral 08/13/2015    is allergic to macrodantin [nitrofurantoin].  MEDICAL HISTORY: Past Medical History:  Diagnosis Date   Breast cancer (HCC)    Hypertension     SURGICAL HISTORY: Past Surgical History:  Procedure Laterality Date   BREAST LUMPECTOMY WITH RADIOACTIVE SEED AND SENTINEL LYMPH NODE BIOPSY Right 09/15/2021   Procedure: RIGHT BREAST SEED BRACKETED LUMPECTOMY AND SENTINEL NODE BIOPSY;  Surgeon: Almond Lint, MD;  Location: [facility];  Service: General;  Laterality: Right;   cataract surgery Bilateral    DILATION AND CURETTAGE OF UTERUS  1970   EXCISION OF BREAST LESION Right 10/15/2021   Procedure: ASPIRATION RIGHT AXILLARY SEROMA;  Surgeon: Almond Lint, MD;  Location: [facility];  Service: General;  Laterality: Right;   eyelid tendon repair Bilateral    PORTACATH PLACEMENT N/A 09/15/2021   Procedure: PORT PLACEMENT;  Surgeon: Almond Lint, MD;  Location: [facility];  Service: General;  Laterality: N/A;   RE-EXCISION OF BREAST LUMPECTOMY Right 10/15/2021   Procedure: RE-EXCISION LUMPECTOMY RIGHT BREAST;  Surgeon: Almond Lint, MD;  Location: [facility];  Service: General;  Laterality: Right;    SOCIAL HISTORY: Social History   Socioeconomic History   Marital status: Married    Spouse name: Not on file   Number of children: Not on file   Years of education: Not on file   Highest education level: Not on file  Occupational History   Not on file  Tobacco Use   Smoking status: Never   Smokeless tobacco: Not on file  Vaping Use   Vaping Use: Never used  Substance and Sexual Activity   Alcohol use: Yes    Comment: 3- 4 drinks   Drug use: Never   Sexual activity: Not Currently  Other Topics Concern   Not on file  Social History Narrative   Not on file    Social Determinants of Health   Financial Resource Strain: Low Risk  (09/09/2021)   Overall Financial Resource Strain (CARDIA)    Difficulty of Paying Living Expenses: Not hard at all  Food Insecurity: No Food Insecurity (09/09/2021)   Hunger Vital Sign    Worried About Running Out of Food in the Last Year: Never true    Ran Out of Food in the Last Year: Never true  Transportation Needs: No Transportation Needs (09/09/2021)   PRAPARE - Administrator, Civil Service (Medical): No    Lack of Transportation (Non-Medical): No  Physical Activity: Not on file  Stress: Not on file  Social Connections: Not on file  Intimate Partner Violence: Not on file    FAMILY HISTORY: Family History  Problem Relation Age of Onset   Heart disease Father 88   Thyroid cancer Other 14       niece's daughter    Review of Systems  Constitutional:  Negative for appetite change, chills, fatigue, fever and unexpected weight change.  HENT:   Negative for hearing loss, lump/mass and trouble swallowing.   Eyes:  Negative for eye problems and icterus.  Respiratory:  Negative for chest tightness, cough and shortness of breath.   Cardiovascular:  Negative for chest pain, leg swelling and palpitations.  Gastrointestinal:  Negative for abdominal distention, abdominal pain, constipation, diarrhea, nausea and vomiting.  Endocrine: Negative for hot flashes.  Genitourinary:  Negative for difficulty urinating.   Musculoskeletal:  Negative for arthralgias.  Skin:  Negative for itching and rash.  Neurological:  Negative for dizziness, extremity weakness, headaches and numbness.  Hematological:  Negative for adenopathy. Does not bruise/bleed easily.  Psychiatric/Behavioral:  Negative for depression. The patient is not nervous/anxious.       PHYSICAL EXAMINATION   Onc Performance Status - 08/30/22 0836       ECOG Perf Status   ECOG Perf Status Restricted in physically strenuous activity but ambulatory  and able to carry out work of a light or sedentary nature, e.g., light house work, office work      KPS SCALE   KPS % SCORE Able to carry on normal activity, minor s/s of disease             Vitals:   08/30/22 0828  BP: (!) 141/72  Pulse: 72  Resp: 18  Temp: (!) 97.5 F (36.4 C)  SpO2: 100%    Physical Exam Constitutional:      General: She is not in acute distress.    Appearance: Normal appearance. She is not toxic-appearing.  HENT:     Head: Normocephalic and atraumatic.  Eyes:     General: No scleral icterus. Cardiovascular:     Rate and Rhythm: Normal rate and regular rhythm.     Pulses: Normal pulses.     Heart sounds: Normal heart sounds.  Pulmonary:     Effort: Pulmonary effort is normal.     Breath sounds: Normal breath sounds.  Abdominal:     General: Abdomen is flat. Bowel sounds are normal. There is no distension.     Palpations: Abdomen is soft.     Tenderness: There is no abdominal tenderness.  Musculoskeletal:        General: No swelling.     Cervical back: Neck supple.  Lymphadenopathy:     Cervical: No cervical adenopathy.  Skin:    General: Skin is warm and dry.     Findings: No rash.  Neurological:     General: No focal deficit present.     Mental Status: She is alert.  Psychiatric:        Mood and Affect: Mood normal.        Behavior: Behavior normal.     LABORATORY DATA:  CBC    Component Value Date/Time   WBC 3.8 (L) 08/30/2022 0749   WBC 6.3 10/08/2021 1129   RBC 4.16 08/30/2022 0749   HGB 11.0 (L) 08/30/2022 0749   HCT 33.7 (L) 08/30/2022 0749   HCT 34.0 06/07/2022 1031   PLT 230 08/30/2022 0749   MCV 81.0 08/30/2022 0749   MCH 26.4 08/30/2022 0749   MCHC 32.6 08/30/2022 0749   RDW 15.9 (H) 08/30/2022 0749   LYMPHSABS 1.3 08/30/2022 0749   MONOABS 0.3 08/30/2022 0749   EOSABS 0.2 08/30/2022 0749   BASOSABS 0.0 08/30/2022 0749    CMP     Component Value Date/Time   NA 139 08/30/2022 0749   K 4.1 08/30/2022 0749    CL 105 08/30/2022 0749   CO2 28 08/30/2022 0749   GLUCOSE 94 08/30/2022 0749   BUN 25 (H) 08/30/2022 0749   CREATININE 0.86 08/30/2022 0749  CALCIUM 9.0 08/30/2022 0749   PROT 6.6 08/30/2022 0749   ALBUMIN 4.0 08/30/2022 0749   AST 27 08/30/2022 0749   ALT 33 08/30/2022 0749   ALKPHOS 65 08/30/2022 0749   BILITOT 0.4 08/30/2022 0749   GFRNONAA >60 08/30/2022 0749         ASSESSMENT and THERAPY PLAN:   Malignant neoplasm of upper-outer quadrant of right breast in female, estrogen receptor positive (HCC) Lawson Fiscal is a 82 year old woman with 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    Current treatment: Herceptin and Anastrozole  Right breast stage IA IDC: She will continue on Herceptin and anastrozole.  She is tolerating these well.  She has no clinical or radiographic signs of breast cancer recurrence.  Her next mammogram will be due in May 2025. At risk for heart failure: Most recent echo: 08/18/2022-EF 55-60% (followed by Dr. Gala Romney) Vaginal dryness causing dysuria: This is being managed with pelvic rehab which is working well. Bone health: She has a history of osteoporosis and is taking vitamin D and calcium.  Her next bone density testing is scheduled for October 12, 2022.  Lawson Fiscal will continue with her current treatment.  We will see her back in 3 weeks for Herceptin alone and in 6 weeks for labs, follow-up, and treatment.   All questions were answered. The patient knows to call the clinic with any problems, questions or concerns. We can certainly see the patient much sooner if necessary.  Total encounter time:30 minutes*in face-to-face visit time, chart review, lab review, care coordination, order entry, and documentation of the encounter time.    Lillard Anes, NP 08/30/22 9:08  AM Medical Oncology and Hematology [facility] [address] [place], [place] [address] Tel. [phone]    Fax. [phone]  *Total Encounter Time as defined by the Centers for Medicare and Medicaid Services includes, in addition to the face-to-face time of a patient visit (documented in the note above) non-face-to-face time: obtaining and reviewing outside history, ordering and reviewing medications, tests or procedures, care coordination (communications with other health care professionals or caregivers) and documentation in the medical record.

## 2022-09-01 ENCOUNTER — Ambulatory Visit (HOSPITAL_COMMUNITY)
Admission: RE | Admit: 2022-09-01 | Discharge: 2022-09-01 | Disposition: A | Discharge status: Home / Self Care | Payer: Medicare Other | Source: Ambulatory Visit | Attending: Internal Medicine | Admitting: Internal Medicine

## 2022-09-01 DIAGNOSIS — Z170 Estrogen receptor positive status [ER+]: Secondary | ICD-10-CM | POA: Insufficient documentation

## 2022-09-01 DIAGNOSIS — C50411 Malignant neoplasm of upper-outer quadrant of right female breast: Secondary | ICD-10-CM | POA: Insufficient documentation

## 2022-09-02 ENCOUNTER — Ambulatory Visit: Payer: Medicare Other | Attending: Obstetrics and Gynecology

## 2022-09-02 DIAGNOSIS — C50411 Malignant neoplasm of upper-outer quadrant of right female breast: Secondary | ICD-10-CM | POA: Insufficient documentation

## 2022-09-02 DIAGNOSIS — R293 Abnormal posture: Secondary | ICD-10-CM | POA: Insufficient documentation

## 2022-09-02 DIAGNOSIS — Z170 Estrogen receptor positive status [ER+]: Secondary | ICD-10-CM | POA: Diagnosis not present

## 2022-09-02 DIAGNOSIS — R279 Unspecified lack of coordination: Secondary | ICD-10-CM | POA: Insufficient documentation

## 2022-09-02 DIAGNOSIS — Z483 Aftercare following surgery for neoplasm: Secondary | ICD-10-CM | POA: Diagnosis not present

## 2022-09-02 DIAGNOSIS — M6281 Muscle weakness (generalized): Secondary | ICD-10-CM | POA: Diagnosis not present

## 2022-09-02 NOTE — Therapy (Signed)
OUTPATIENT PHYSICAL THERAPY TREATMENT NOTE   Patient Name: Rhonda Richardson MRN: [id number] DOB:10/15/1940, 82 y.o., female Today's Date: 09/02/2022  PCP: Irven Coe, MD REFERRING PROVIDER: Olivia Mackie, MD   END OF SESSION:   PT End of Session - 09/02/22 1105     Visit Number 3    Date for PT Re-Evaluation 10/13/22    Authorization Type Medicare    Progress Note Due on Visit 10    PT Start Time 1102    PT Stop Time 1140    PT Time Calculation (min) 38 min    Activity Tolerance Patient tolerated treatment well    Behavior During Therapy WFL for tasks assessed/performed              Past Medical History:  Diagnosis Date   Breast cancer (HCC)    Hypertension    Past Surgical History:  Procedure Laterality Date   BREAST LUMPECTOMY WITH RADIOACTIVE SEED AND SENTINEL LYMPH NODE BIOPSY Right 09/15/2021   Procedure: RIGHT BREAST SEED BRACKETED LUMPECTOMY AND SENTINEL NODE BIOPSY;  Surgeon: Almond Lint, MD;  Location: [facility];  Service: General;  Laterality: Right;   cataract surgery Bilateral    DILATION AND CURETTAGE OF UTERUS  1970   EXCISION OF BREAST LESION Right 10/15/2021   Procedure: ASPIRATION RIGHT AXILLARY SEROMA;  Surgeon: Almond Lint, MD;  Location: [facility];  Service: General;  Laterality: Right;   eyelid tendon repair Bilateral    PORTACATH PLACEMENT N/A 09/15/2021   Procedure: PORT PLACEMENT;  Surgeon: Almond Lint, MD;  Location: [facility];  Service: General;  Laterality: N/A;   RE-EXCISION OF BREAST LUMPECTOMY Right 10/15/2021   Procedure: RE-EXCISION LUMPECTOMY RIGHT BREAST;  Surgeon: Almond Lint, MD;  Location: [facility];  Service: General;  Laterality: Right;   Patient Active Problem List   Diagnosis Date Noted   Family history of ischemic heart disease (IHD) 08/30/2022   History of dysplastic nevus 08/30/2022   Port-A-Cath in place 11/18/2021   Genetic testing 09/28/2021   Essential hypertension 09/09/2021    Malignant neoplasm of upper-outer quadrant of right breast in female, estrogen receptor positive (HCC) 09/08/2021   Gastroesophageal reflux disease 04/08/2016   Sensorineural hearing loss (SNHL), bilateral 08/13/2015    REFERRING DIAG: N81.9 (ICD-10-CM) - Female genital prolapse, unspecified  THERAPY DIAG:  Abnormal posture  Muscle weakness (generalized)  Unspecified lack of coordination  Aftercare following surgery for neoplasm  Malignant neoplasm of upper-outer quadrant of right breast in female, estrogen receptor positive (HCC)  Rationale for Evaluation and Treatment Rehabilitation  PERTINENT HISTORY: Breast cancer  PRECAUTIONS: NA  SUBJECTIVE:  SUBJECTIVE STATEMENT:  Pt feels like vaginal bulging is better. She has a hard time predicting when it is going to feel better/worse.   PAIN:  Are you having pain? No   EVAL SUBJECTIVE STATEMENT: Pt states that she started having some dysuria and felt like there was a bulge in the vagina; this was worse when she felt constipated. MD stated that prolapse is not severe. Estrogen cancer was estrogen positive. History of frequent bladder infections. Fluid intake: Yes: varies, tries to drink 64oz a day    PAIN:  Are you having pain? No   PRECAUTIONS: None  WEIGHT BEARING RESTRICTIONS: No  FALLS:  Has patient fallen in last 6 months? No  LIVING ENVIRONMENT: Lives with: lives with their family Lives in: House/apartment   OCCUPATION: works part time at pharmacy   PLOF: Independent  PATIENT GOALS: decrease sensation of vaginal bulge  PERTINENT HISTORY:  Breast cancer Sexual abuse: No  BOWEL MOVEMENT: Pain with bowel movement: No Type of bowel movement:Frequency daily and Strain No - not currently Fully empty rectum: Yes: - Leakage:  No Pads: No Fiber supplement: No  URINATION: Pain with urination: Yes Fully empty bladder: No Stream: Strong once she gets going  Urgency: No Frequency: 3-4 hours during the day;  Leakage: None Pads: No  INTERCOURSE: Pain with intercourse: not sexually active since chemo  PREGNANCY: Vaginal deliveries 3 Tearing Yes: episiotomy   PROLAPSE: Cystocele vaginal bulge   OBJECTIVE:  08/04/22:  COGNITION: Overall cognitive status: Within functional limits for tasks assessed     SENSATION: Light touch: Appears intact Proprioception: Appears intact  FUNCTIONAL TESTS: core weakness with sit up   GAIT: Comments: WNL  POSTURE: rounded shoulders, forward head, decreased lumbar lordosis, increased thoracic kyphosis, and posterior pelvic tilt   PALPATION:   General  no tenderness in abdomen, no scar tissue                External Perineal Exam dryness                             Internal Pelvic Floor no discomfort or restriction  Patient confirms identification and approves PT to assess internal pelvic floor and treatment Yes  PELVIC MMT:   MMT eval  Vaginal 3/5 strength, 5 second endurance, 6 repeat contractions in 10 seconds  Internal Anal Sphincter   External Anal Sphincter   Puborectalis   Diastasis Recti 2 finger width separation with distortion in above umbilicus  (Blank rows = not tested)        TONE: low  PROLAPSE: None detected upon exam; some small movement in urethra and anterior vaginal wall when she sat up; unable to coordinate appropriate bearing down  TODAY'S TREATMENT:                                                                                                                              DATE:  09/02/22 Neuromuscular re-education: Transversus abdominus training with multimodal cues for improved motor control and breath coordination Supine hip adduction ball squeeze 10x Supine bil UE ball press 10x Bridge with hip adduction ball squeeze  10x Therapeutic activities: Reviewed vibrator concepts and use Pressure management concepts   08/10/22 Neuromuscular re-education: Pelvic floor muscle contractions Regular stance Wide stance Staggered stance Therapeutic activities: Vulvovaginal moisture Double voiding Orgasm Regular orgasm for vaginal/vulvar health Comfortable vibrator recommendation    08/04/22  EVAL  Neuromuscular re-education: Pelvic floor contraction training Quick flicks Long holds Therapeutic activities: Squatty potty Relaxed toilet mechanics    PATIENT EDUCATION:  Education details: see above Person educated: Patient Education method: Programmer, multimedia, Demonstration, Actor cues, Verbal cues, and Handouts Education comprehension: verbalized understanding  HOME EXERCISE PROGRAM: [id number]   ASSESSMENT:  CLINICAL IMPRESSION: Pt doing very well overall with less sensation of vaginal bulging and feeling it less often when she checks while showering. We continued discussing vibrator use and reviewed type that would likely be most comfortable/beneficial. She did very well with concepts of pressure management/whole core activation. She was able to start with core training and able to coordinate with breath in exercises She will continue to benefit from skilled PT intervention in order to improve sensation of vaginal bulge, strengthening pelvic floor, and improve abdominal pressure management.   OBJECTIVE IMPAIRMENTS: decreased activity tolerance, decreased coordination, decreased endurance, decreased strength, increased fascial restrictions, increased muscle spasms, impaired tone, postural dysfunction, and pain.   ACTIVITY LIMITATIONS: will feel vaginal bulge during functional activity; bowel movements  PARTICIPATION LIMITATIONS: interpersonal relationship  PERSONAL FACTORS: 1 comorbidity: medical history are also affecting patient's functional outcome.   REHAB POTENTIAL: Good  CLINICAL DECISION  MAKING: Stable/uncomplicated  EVALUATION COMPLEXITY: Low   GOALS: Goals reviewed with patient? Yes  SHORT TERM GOALS: Target date: 09/15/22   Pt will be independent with HEP.   Baseline: Goal status: INITIAL  2.  Pt will be able to correctly perform diaphragmatic breathing and appropriate pressure management in order to prevent worsening vaginal wall laxity and improve pelvic floor A/ROM.   Baseline:  Goal status: INITIAL  3.  Pt will be able to teach back and utilize urge suppression technique in order to help reduce number of trips to the bathroom.    Baseline:  Goal status: INITIAL  4.  Pt will be independent with diaphragmatic breathing and down training activities in order to improve pelvic floor relaxation.  Baseline:  Goal status: INITIAL    LONG TERM GOALS: Target date: 10/13/2022   Pt will be independent with advanced HEP.   Baseline:  Goal status: INITIAL  2.  Pt will be able to go 2-3 hours in between voids without urgency or incontinence in order to improve QOL and perform all functional activities with less difficulty.   Baseline:  Goal status: INITIAL  3.  Pt will decrease frequency of nightly trips to the bathroom to 1 or less in order to get restful sleep.   Baseline:  Goal status: INITIAL  4.  Pt will report 0/10 pain with vaginal penetration in order to improve intimate relationship with partner.    Baseline:  Goal status: INITIAL  5.  Pt will demonstrate normal pelvic floor muscle tone and A/ROM, able to achieve 4/5 strength with contractions and 10 sec endurance, in order to provide appropriate lumbopelvic support in functional activities.   Baseline:  Goal status: INITIAL   PLAN:  PT FREQUENCY: 1-2x/week  PT DURATION: 10 weeks  PLANNED INTERVENTIONS: Therapeutic exercises,  Therapeutic activity, Neuromuscular re-education, Balance training, Gait training, Patient/Family education, Self Care, Joint mobilization, Dry Needling,  Biofeedback, and Manual therapy  PLAN FOR NEXT SESSION: Progress concepts of core and pressure management with more functional exercises.   Julio Alm, PT, DPT05/23/2411:45 AM

## 2022-09-09 ENCOUNTER — Other Ambulatory Visit: Payer: Self-pay

## 2022-09-20 ENCOUNTER — Inpatient Hospital Stay: Payer: Medicare Other | Attending: Hematology and Oncology

## 2022-09-20 ENCOUNTER — Inpatient Hospital Stay: Payer: Medicare Other

## 2022-09-20 ENCOUNTER — Other Ambulatory Visit: Payer: Self-pay

## 2022-09-20 ENCOUNTER — Inpatient Hospital Stay: Payer: Medicare Other | Admitting: Hematology and Oncology

## 2022-09-20 VITALS — BP 152/78 | HR 72 | Temp 98.0°F | Resp 18 | Ht 62.0 in | Wt 139.8 lb

## 2022-09-20 DIAGNOSIS — Z170 Estrogen receptor positive status [ER+]: Secondary | ICD-10-CM | POA: Diagnosis not present

## 2022-09-20 DIAGNOSIS — Z79899 Other long term (current) drug therapy: Secondary | ICD-10-CM | POA: Insufficient documentation

## 2022-09-20 DIAGNOSIS — C50411 Malignant neoplasm of upper-outer quadrant of right female breast: Secondary | ICD-10-CM | POA: Insufficient documentation

## 2022-09-20 DIAGNOSIS — Z5112 Encounter for antineoplastic immunotherapy: Secondary | ICD-10-CM | POA: Diagnosis not present

## 2022-09-20 MED ORDER — TRASTUZUMAB-HYALURONIDASE-OYSK 600-10000 MG-UNT/5ML ~~LOC~~ SOLN
600.0000 mg | Freq: Once | SUBCUTANEOUS | Status: AC
  Administered 2022-09-20: 600 mg via SUBCUTANEOUS
  Filled 2022-09-20: qty 5

## 2022-09-20 MED ORDER — ACETAMINOPHEN 325 MG PO TABS
650.0000 mg | ORAL_TABLET | Freq: Once | ORAL | Status: AC
  Administered 2022-09-20: 650 mg via ORAL
  Filled 2022-09-20: qty 2

## 2022-09-20 NOTE — Patient Instructions (Signed)
[facility]  Discharge Instructions: Thank you for choosing [facility] to provide your oncology and hematology care.   If you have a lab appointment with the Cancer Center, please go directly to the Cancer Center and check in at the registration area.   Wear comfortable clothing and clothing appropriate for easy access to any Portacath or PICC line.   We strive to give you quality time with your provider. You may need to reschedule your appointment if you arrive late (15 or more minutes).  Arriving late affects you and other patients whose appointments are after yours.  Also, if you miss three or more appointments without notifying the office, you may be dismissed from the clinic at the provider's discretion.      For prescription refill requests, have your pharmacy contact our office and allow 72 hours for refills to be completed.    Today you received the following chemotherapy and/or immunotherapy agents: trastuzumab-hyaluronidase      To help prevent nausea and vomiting after your treatment, we encourage you to take your nausea medication as directed.  BELOW ARE SYMPTOMS THAT SHOULD BE REPORTED IMMEDIATELY: *FEVER GREATER THAN 100.4 F (38 C) OR HIGHER *CHILLS OR SWEATING *NAUSEA AND VOMITING THAT IS NOT CONTROLLED WITH YOUR NAUSEA MEDICATION *UNUSUAL SHORTNESS OF BREATH *UNUSUAL BRUISING OR BLEEDING *URINARY PROBLEMS (pain or burning when urinating, or frequent urination) *BOWEL PROBLEMS (unusual diarrhea, constipation, pain near the anus) TENDERNESS IN MOUTH AND THROAT WITH OR WITHOUT PRESENCE OF ULCERS (sore throat, sores in mouth, or a toothache) UNUSUAL RASH, SWELLING OR PAIN  UNUSUAL VAGINAL DISCHARGE OR ITCHING   Items with * indicate a potential emergency and should be followed up as soon as possible or go to the Emergency Department if any problems should occur.  Please show the CHEMOTHERAPY ALERT CARD or IMMUNOTHERAPY  ALERT CARD at check-in to the Emergency Department and triage nurse.  Should you have questions after your visit or need to cancel or reschedule your appointment, please contact [facility]  Dept: [phone]  and follow the prompts.  Office hours are 8:00 a.m. to 4:30 p.m. Monday - Friday. Please note that voicemails left after 4:00 p.m. may not be returned until the following business day.  We are closed weekends and major holidays. You have access to a nurse at all times for urgent questions. Please call the main number to the clinic Dept: [phone] and follow the prompts.   For any non-urgent questions, you may also contact your provider using MyChart. We now offer e-Visits for anyone 18 and older to request care online for non-urgent symptoms. For details visit [email or web address].   Also download the MyChart app! Go to the app store, search "MyChart", open the app, select [facility], and log in with your MyChart username and password.  Trastuzumab; Hyaluronidase Injection What is this medication? TRASTUZUMAB; HYALURONIDASE (tras TOO zoo mab; hye al ur ON i dase) treats breast cancer. Trastuzumab works by blocking a protein that causes cancer cells to grow and multiply. This helps to slow or stop the spread of cancer cells. Hyaluronidase works by increasing the absorption of other medications in the body to help them work better. It is a combination medication that contains a monoclonal antibody. This medicine may be used for other purposes; ask your health care provider or pharmacist if you have questions. COMMON BRAND NAME(S): HERCEPTIN HYLECTA What should I tell my care team before   I take this medication? They need to know if you have any of these conditions: Heart failure Lung disease An unusual or allergic reaction to trastuzumab, or other medications, foods, dyes, or preservatives Pregnant or trying to get pregnant Breast-feeding How  should I use this medication? This medication is injected under the skin. It is given by your care team in a hospital or clinic setting. Talk to your care team about the use of this medication in children. It is not approved for use in children. Overdosage: If you think you have taken too much of this medicine contact a poison control center or emergency room at once. NOTE: This medicine is only for you. Do not share this medicine with others. What if I miss a dose? Keep appointments for follow-up doses. It is important not to miss your dose. Call your care team if you are unable to keep an appointment. What may interact with this medication? Certain types of chemotherapy, such as daunorubicin, doxorubicin, epirubicin, idarubicin This list may not describe all possible interactions. Give your health care provider a list of all the medicines, herbs, non-prescription drugs, or dietary supplements you use. Also tell them if you smoke, drink alcohol, or use illegal drugs. Some items may interact with your medicine. What should I watch for while using this medication? Your condition will be monitored carefully while you are receiving this medication. This medication may make you feel generally unwell. This is not uncommon as chemotherapy can affect healthy cells as well as cancer cells. Report any side effects. Continue your course of treatment even though you feel ill unless your care team tells you to stop. This medication may increase your risk of getting an infection. Call your care team for advice if you get a fever, chills, sore throat, or other symptoms of a cold or flu. Do not treat yourself. Try to avoid being around people who are sick. Avoid taking medications that contain aspirin, acetaminophen, ibuprofen, naproxen, or ketoprofen unless instructed by your care team. These medications may hide a fever. Talk to your care team if you may be pregnant. Serious birth defects can occur if you take  this medication during pregnancy and for 7 months after the last dose. You will need a negative pregnancy test before starting this medication. Contraception is recommended while taking this medication and for 7 months after the last dose. Your care team can help you find the option that works for you. Do not breastfeed while taking this medication or for 7 months after the last dose. What side effects may I notice from receiving this medication? Side effects that you should report to your care team as soon as possible: Allergic reactions or angioedema--skin rash, itching or hives, swelling of the face, eyes, lips, tongue, arms, or legs, trouble swallowing or breathing Dry cough, shortness of breath or trouble breathing Heart failure--shortness of breath, swelling of the ankles, feet, or hands, sudden weight gain, unusual weakness or fatigue Heart rhythm changes--fast or irregular heartbeat, dizziness, feeling faint or lightheaded, chest pain, trouble breathing Increase in blood pressure Infection--fever, chills, cough, or sore throat Side effects that usually do not require medical attention (report these to your care team if they continue or are bothersome): Diarrhea Hair loss Headache Muscle pain Nausea Unusual weakness or fatigue This list may not describe all possible side effects. Call your doctor for medical advice about side effects. You may report side effects to FDA at 1-800-FDA-1088. Where should I keep my medication? This medication   is given in a hospital or clinic. It will not be stored at home. NOTE: This sheet is a summary. It may not cover all possible information. If you have questions about this medicine, talk to your doctor, pharmacist, or health care provider.  2023 Elsevier/Gold Standard (2021-08-11 00:00:00)  

## 2022-09-29 ENCOUNTER — Telehealth (HOSPITAL_COMMUNITY): Payer: Self-pay | Admitting: Cardiology

## 2022-09-29 MED ORDER — ASPIRIN 81 MG PO TBEC: 81.0000 mg | DELAYED_RELEASE_TABLET | Freq: Every day | ORAL | 3 refills | Status: AC

## 2022-09-29 MED ORDER — ROSUVASTATIN CALCIUM 20 MG PO TABS: 20.0000 mg | ORAL_TABLET | Freq: Every day | ORAL | 3 refills | Status: DC

## 2022-09-29 NOTE — Telephone Encounter (Signed)
Pt returned call to triage -reviewed cardiac CT results  Pt aware of results and voiced understanding  Script sent

## 2022-09-30 ENCOUNTER — Encounter: Payer: Self-pay | Admitting: *Deleted

## 2022-09-30 DIAGNOSIS — Z170 Estrogen receptor positive status [ER+]: Secondary | ICD-10-CM

## 2022-10-04 ENCOUNTER — Ambulatory Visit: Payer: Medicare Other | Attending: General Surgery | Admitting: Rehabilitation

## 2022-10-04 DIAGNOSIS — R293 Abnormal posture: Secondary | ICD-10-CM | POA: Insufficient documentation

## 2022-10-04 DIAGNOSIS — M6281 Muscle weakness (generalized): Secondary | ICD-10-CM | POA: Insufficient documentation

## 2022-10-04 DIAGNOSIS — R279 Unspecified lack of coordination: Secondary | ICD-10-CM | POA: Insufficient documentation

## 2022-10-04 DIAGNOSIS — Z483 Aftercare following surgery for neoplasm: Secondary | ICD-10-CM | POA: Insufficient documentation

## 2022-10-04 NOTE — Therapy (Signed)
  OUTPATIENT PHYSICAL THERAPY SOZO SCREENING NOTE   Patient Name: Rhonda Richardson MRN: [id number] DOB:1941/03/24, 82 y.o., female Today's Date: 10/04/2022  PCP: Irven Coe, MD REFERRING PROVIDER: Almond Lint, MD   PT End of Session - 10/04/22 1501     Visit Number 3   screen only   PT Start Time 1455    PT Stop Time 1500    PT Time Calculation (min) 5 min    Activity Tolerance Patient tolerated treatment well    Behavior During Therapy WFL for tasks assessed/performed             Past Medical History:  Diagnosis Date   Breast cancer (HCC)    Hypertension    Past Surgical History:  Procedure Laterality Date   BREAST LUMPECTOMY WITH RADIOACTIVE SEED AND SENTINEL LYMPH NODE BIOPSY Right 09/15/2021   Procedure: RIGHT BREAST SEED BRACKETED LUMPECTOMY AND SENTINEL NODE BIOPSY;  Surgeon: Almond Lint, MD;  Location: [facility];  Service: General;  Laterality: Right;   cataract surgery Bilateral    DILATION AND CURETTAGE OF UTERUS  1970   EXCISION OF BREAST LESION Right 10/15/2021   Procedure: ASPIRATION RIGHT AXILLARY SEROMA;  Surgeon: Almond Lint, MD;  Location: [facility];  Service: General;  Laterality: Right;   eyelid tendon repair Bilateral    PORTACATH PLACEMENT N/A 09/15/2021   Procedure: PORT PLACEMENT;  Surgeon: Almond Lint, MD;  Location: [facility];  Service: General;  Laterality: N/A;   RE-EXCISION OF BREAST LUMPECTOMY Right 10/15/2021   Procedure: RE-EXCISION LUMPECTOMY RIGHT BREAST;  Surgeon: Almond Lint, MD;  Location: [facility];  Service: General;  Laterality: Right;   Patient Active Problem List   Diagnosis Date Noted   Family history of ischemic heart disease (IHD) 08/30/2022   History of dysplastic nevus 08/30/2022   Port-A-Cath in place 11/18/2021   Genetic testing 09/28/2021   Essential hypertension 09/09/2021   Malignant neoplasm of upper-outer quadrant of right breast in female, estrogen receptor positive (HCC) 09/08/2021    Gastroesophageal reflux disease 04/08/2016   Sensorineural hearing loss (SNHL), bilateral 08/13/2015    REFERRING DIAG: right breast cancer at risk for lymphedema  THERAPY DIAG:  Aftercare following surgery for neoplasm  PERTINENT HISTORY: right UE Lymphedema risk, None   PRECAUTIONS: right UE Lymphedema risk, None  SUBJECTIVE: Pt returns for her 3 month L-dex screen.   PAIN:  Are you having pain? No  SOZO SCREENING: Patient was assessed today using the SOZO machine to determine the lymphedema index score. This was compared to her baseline score. It was determined that she is within the recommended range when compared to her baseline and no further action is needed at this time. She will continue SOZO screenings. These are done every 3 months for 2 years post operatively followed by every 6 months for 2 years, and then annually.   L-DEX FLOWSHEETS - 10/04/22 1500       L-DEX LYMPHEDEMA SCREENING   Measurement Type Unilateral    L-DEX MEASUREMENT EXTREMITY Upper Extremity    POSITION  Standing    DOMINANT SIDE Right    At Risk Side Right    BASELINE SCORE (UNILATERAL) -1.9    L-DEX SCORE (UNILATERAL) -3.1    VALUE CHANGE (UNILAT) -1.2               Melford Tullier, Julieanne Manson, PT 10/04/2022, 3:02 PM

## 2022-10-05 ENCOUNTER — Ambulatory Visit: Payer: Medicare Other

## 2022-10-05 ENCOUNTER — Other Ambulatory Visit: Payer: Self-pay

## 2022-10-05 DIAGNOSIS — M6281 Muscle weakness (generalized): Secondary | ICD-10-CM | POA: Diagnosis not present

## 2022-10-05 DIAGNOSIS — R293 Abnormal posture: Secondary | ICD-10-CM | POA: Diagnosis not present

## 2022-10-05 DIAGNOSIS — Z483 Aftercare following surgery for neoplasm: Secondary | ICD-10-CM | POA: Diagnosis not present

## 2022-10-05 DIAGNOSIS — R279 Unspecified lack of coordination: Secondary | ICD-10-CM | POA: Diagnosis not present

## 2022-10-05 NOTE — Therapy (Addendum)
OUTPATIENT PHYSICAL THERAPY TREATMENT NOTE   Patient Name: Rhonda Richardson MRN: [id number] DOB:07-03-1940, 82 y.o., female Today's Date: 10/05/2022  PCP: Irven Coe, MD REFERRING PROVIDER: Olivia Mackie, MD   END OF SESSION:   PT End of Session - 10/05/22 1151     Visit Number 4    Date for PT Re-Evaluation 10/13/22    Authorization Type Medicare    PT Start Time 1146               Past Medical History:  Diagnosis Date   Breast cancer (HCC)    Hypertension    Past Surgical History:  Procedure Laterality Date   BREAST LUMPECTOMY WITH RADIOACTIVE SEED AND SENTINEL LYMPH NODE BIOPSY Right 09/15/2021   Procedure: RIGHT BREAST SEED BRACKETED LUMPECTOMY AND SENTINEL NODE BIOPSY;  Surgeon: Almond Lint, MD;  Location: [facility];  Service: General;  Laterality: Right;   cataract surgery Bilateral    DILATION AND CURETTAGE OF UTERUS  1970   EXCISION OF BREAST LESION Right 10/15/2021   Procedure: ASPIRATION RIGHT AXILLARY SEROMA;  Surgeon: Almond Lint, MD;  Location: [facility];  Service: General;  Laterality: Right;   eyelid tendon repair Bilateral    PORTACATH PLACEMENT N/A 09/15/2021   Procedure: PORT PLACEMENT;  Surgeon: Almond Lint, MD;  Location: [facility];  Service: General;  Laterality: N/A;   RE-EXCISION OF BREAST LUMPECTOMY Right 10/15/2021   Procedure: RE-EXCISION LUMPECTOMY RIGHT BREAST;  Surgeon: Almond Lint, MD;  Location: [facility];  Service: General;  Laterality: Right;   Patient Active Problem List   Diagnosis Date Noted   Family history of ischemic heart disease (IHD) 08/30/2022   History of dysplastic nevus 08/30/2022   Port-A-Cath in place 11/18/2021   Genetic testing 09/28/2021   Essential hypertension 09/09/2021   Malignant neoplasm of upper-outer quadrant of right breast in female, estrogen receptor positive (HCC) 09/08/2021   Gastroesophageal reflux disease 04/08/2016   Sensorineural hearing loss (SNHL), bilateral  08/13/2015    REFERRING DIAG: N81.9 (ICD-10-CM) - Female genital prolapse, unspecified  THERAPY DIAG:  Unspecified lack of coordination  Abnormal posture  Muscle weakness (generalized)  Rationale for Evaluation and Treatment Rehabilitation  PERTINENT HISTORY: Breast cancer  PRECAUTIONS: NA  SUBJECTIVE:                                                                                                                                                                                      SUBJECTIVE STATEMENT:  Pt states that she is doing very well and she is feeling confident with exercises. She has been using vibrator and working on getting comfortable using it. She was able to  have intercourse with husband, and did not have issues due to her own pelvic floor. She has been using lubricant with good success. She reports improved bladder emptying and being able to fall back asleep when she wakes up in the middle of the night to urinate. She has been able to decrease how often she is going to the bathroom.    PAIN:  Are you having pain? No   EVAL SUBJECTIVE STATEMENT: Pt states that she started having some dysuria and felt like there was a bulge in the vagina; this was worse when she felt constipated. MD stated that prolapse is not severe. Estrogen cancer was estrogen positive. History of frequent bladder infections. Fluid intake: Yes: varies, tries to drink 64oz a day    PAIN:  Are you having pain? No   PRECAUTIONS: None  WEIGHT BEARING RESTRICTIONS: No  FALLS:  Has patient fallen in last 6 months? No  LIVING ENVIRONMENT: Lives with: lives with their family Lives in: House/apartment   OCCUPATION: works part time at pharmacy   PLOF: Independent  PATIENT GOALS: decrease sensation of vaginal bulge  PERTINENT HISTORY:  Breast cancer Sexual abuse: No  BOWEL MOVEMENT: Pain with bowel movement: No Type of bowel movement:Frequency daily and Strain No - not currently Fully  empty rectum: Yes: - Leakage: No Pads: No Fiber supplement: No  URINATION: Pain with urination: Yes Fully empty bladder: No Stream: Strong once she gets going  Urgency: No Frequency: 3-4 hours during the day;  Leakage: None Pads: No  INTERCOURSE: Pain with intercourse: not sexually active since chemo  PREGNANCY: Vaginal deliveries 3 Tearing Yes: episiotomy   PROLAPSE: Cystocele vaginal bulge   OBJECTIVE:  08/04/22:  COGNITION: Overall cognitive status: Within functional limits for tasks assessed     SENSATION: Light touch: Appears intact Proprioception: Appears intact  FUNCTIONAL TESTS: core weakness with sit up   GAIT: Comments: WNL  POSTURE: rounded shoulders, forward head, decreased lumbar lordosis, increased thoracic kyphosis, and posterior pelvic tilt   PALPATION:   General  no tenderness in abdomen, no scar tissue                External Perineal Exam dryness                             Internal Pelvic Floor no discomfort or restriction  Patient confirms identification and approves PT to assess internal pelvic floor and treatment Yes  PELVIC MMT:   MMT eval  Vaginal 3/5 strength, 5 second endurance, 6 repeat contractions in 10 seconds  Internal Anal Sphincter   External Anal Sphincter   Puborectalis   Diastasis Recti 2 finger width separation with distortion in above umbilicus  (Blank rows = not tested)        TONE: low  PROLAPSE: None detected upon exam; some small movement in urethra and anterior vaginal wall when she sat up; unable to coordinate appropriate bearing down  TODAY'S TREATMENT:  DATE:  10/05/22 Therapeutic activities: Vibrator review Discussed intercourse with partner - decrease stress, alternate ways to have intimacy HEP review   09/02/22 Neuromuscular re-education: Transversus abdominus  training with multimodal cues for improved motor control and breath coordination Supine hip adduction ball squeeze 10x Supine bil UE ball press 10x Bridge with hip adduction ball squeeze 10x Therapeutic activities: Reviewed vibrator concepts and use Pressure management concepts   08/10/22 Neuromuscular re-education: Pelvic floor muscle contractions Regular stance Wide stance Staggered stance Therapeutic activities: Vulvovaginal moisture Double voiding Orgasm Regular orgasm for vaginal/vulvar health Comfortable vibrator recommendation     PATIENT EDUCATION:  Education details: see above Person educated: Patient Education method: Explanation, Demonstration, Tactile cues, Verbal cues, and Handouts Education comprehension: verbalized understanding  HOME EXERCISE PROGRAM: [id number]   ASSESSMENT:  CLINICAL IMPRESSION: Pt has done very well in pelvic floor physical therapy with improved urgency, decreased nightly trips to the bathroom, and more complete bladder emptying with use of double voiding. She has done well with learning appropriate pressure management. She has also gained confidence in self-stimulation of clitoris and intimacy with husband, not having any pain during these activities. Due to progress and having met all goals, she is prepared to D/C skilled physical therapy intervention. She was encouraged to call with any questions or concerns.   OBJECTIVE IMPAIRMENTS: decreased activity tolerance, decreased coordination, decreased endurance, decreased strength, increased fascial restrictions, increased muscle spasms, impaired tone, postural dysfunction, and pain.   ACTIVITY LIMITATIONS: will feel vaginal bulge during functional activity; bowel movements  PARTICIPATION LIMITATIONS: interpersonal relationship  PERSONAL FACTORS: 1 comorbidity: medical history are also affecting patient's functional outcome.   REHAB POTENTIAL: Good  CLINICAL DECISION MAKING:  Stable/uncomplicated  EVALUATION COMPLEXITY: Low   GOALS: Goals reviewed with patient? Yes  SHORT TERM GOALS: Target date: 09/15/22 - updated 10/05/22  Pt will be independent with HEP.   Baseline: Goal status: MET 10/05/22  2.  Pt will be able to correctly perform diaphragmatic breathing and appropriate pressure management in order to prevent worsening vaginal wall laxity and improve pelvic floor A/ROM.   Baseline:  Goal status: MET 10/05/22  3.  Pt will be able to teach back and utilize urge suppression technique in order to help reduce number of trips to the bathroom.    Baseline:  Goal status: MET 10/05/22  4.  Pt will be independent with diaphragmatic breathing and down training activities in order to improve pelvic floor relaxation.  Baseline:  Goal status: MET 10/05/22    LONG TERM GOALS: Target date: 10/13/2022 - 10/05/22  Pt will be independent with advanced HEP.   Baseline:  Goal status: MET 10/05/22  2.  Pt will be able to go 2-3 hours in between voids without urgency or incontinence in order to improve QOL and perform all functional activities with less difficulty.   Baseline:  Goal status: MET 10/05/22  3.  Pt will decrease frequency of nightly trips to the bathroom to 1 or less in order to get restful sleep.   Baseline:  Goal status: DISCHARGED 10/05/22  4.  Pt will report 0/10 pain with vaginal penetration in order to improve intimate relationship with partner.    Baseline:  Goal status: MET 10/05/22  5.  Pt will demonstrate normal pelvic floor muscle tone and A/ROM, able to achieve 4/5 strength with contractions and 10 sec endurance, in order to provide appropriate lumbopelvic support in functional activities.   Baseline: Not formally assessed due to excellent progress Goal status: DISCHARGED  PLAN:  PT FREQUENCY: -  PT DURATION: -  PLANNED INTERVENTIONS: -  PLAN FOR NEXT SESSION: D/C  PHYSICAL THERAPY DISCHARGE SUMMARY  Visits from Start of  Care: 4  Current functional level related to goals / functional outcomes: Independent   Remaining deficits: See above   Education / Equipment: HEP   Patient agrees to discharge. Patient goals were met. Patient is being discharged due to meeting the stated rehab goals.  Julio Alm, PT, DPT06/25/2412:27 PM

## 2022-10-06 ENCOUNTER — Telehealth (HOSPITAL_COMMUNITY): Payer: Self-pay | Admitting: Cardiology

## 2022-10-06 NOTE — Telephone Encounter (Signed)
Pt aware.

## 2022-10-06 NOTE — Telephone Encounter (Signed)
Pt reports starting Crestor 6/22 C/O severe constipation and dysuria  Reports symptoms are very similar to those while on chemo  -advised to hold until further input from provider as above SE are not most common to me -advised if dysuria/constipation continues should reach out to PCP for further evaluation

## 2022-10-06 NOTE — Telephone Encounter (Signed)
While is it possible rosuvastatin could contribute to these symptoms, these are not common symptoms associated with the medication. Patient has a history of dysuria and constipation, for which she was referred to pelvic rehab.  Is seen by PT, who noted on 08/10/22: Pt states that she started having some dysuria and felt like there was a bulge in the vagina; this was worse when she felt constipated. MD stated that prolapse is not severe.  Given that this is a chronic problem for her, I do not think its likely to be caused by rosuvastatin. Would not be unreasonable to change to a different statin if patient wishes (atorvastatin 10 mg daily) but reaching out to PCP to further discuss cause and treatment is likely the best course of action.

## 2022-10-11 ENCOUNTER — Encounter: Payer: Self-pay | Admitting: *Deleted

## 2022-10-11 ENCOUNTER — Inpatient Hospital Stay: Payer: Medicare Other

## 2022-10-12 ENCOUNTER — Inpatient Hospital Stay (HOSPITAL_BASED_OUTPATIENT_CLINIC_OR_DEPARTMENT_OTHER): Payer: Medicare Other | Admitting: Hematology and Oncology

## 2022-10-12 ENCOUNTER — Other Ambulatory Visit: Payer: Self-pay

## 2022-10-12 ENCOUNTER — Ambulatory Visit
Admission: RE | Admit: 2022-10-12 | Discharge: 2022-10-12 | Disposition: A | Discharge status: Home / Self Care | Payer: Medicare Other | Source: Ambulatory Visit | Attending: Family Medicine | Admitting: Family Medicine

## 2022-10-12 ENCOUNTER — Inpatient Hospital Stay: Payer: Medicare Other | Attending: Hematology and Oncology

## 2022-10-12 ENCOUNTER — Inpatient Hospital Stay: Payer: Medicare Other

## 2022-10-12 VITALS — BP 148/69 | HR 68 | Resp 18 | Wt 139.1 lb

## 2022-10-12 DIAGNOSIS — Z170 Estrogen receptor positive status [ER+]: Secondary | ICD-10-CM

## 2022-10-12 DIAGNOSIS — Z79811 Long term (current) use of aromatase inhibitors: Secondary | ICD-10-CM | POA: Diagnosis not present

## 2022-10-12 DIAGNOSIS — R300 Dysuria: Secondary | ICD-10-CM | POA: Diagnosis not present

## 2022-10-12 DIAGNOSIS — E349 Endocrine disorder, unspecified: Secondary | ICD-10-CM | POA: Diagnosis not present

## 2022-10-12 DIAGNOSIS — Z79899 Other long term (current) drug therapy: Secondary | ICD-10-CM | POA: Diagnosis not present

## 2022-10-12 DIAGNOSIS — M8588 Other specified disorders of bone density and structure, other site: Secondary | ICD-10-CM | POA: Diagnosis not present

## 2022-10-12 DIAGNOSIS — Z808 Family history of malignant neoplasm of other organs or systems: Secondary | ICD-10-CM | POA: Insufficient documentation

## 2022-10-12 DIAGNOSIS — Z86018 Personal history of other benign neoplasm: Secondary | ICD-10-CM | POA: Diagnosis not present

## 2022-10-12 DIAGNOSIS — Z853 Personal history of malignant neoplasm of breast: Secondary | ICD-10-CM | POA: Diagnosis not present

## 2022-10-12 DIAGNOSIS — Z5112 Encounter for antineoplastic immunotherapy: Secondary | ICD-10-CM | POA: Insufficient documentation

## 2022-10-12 DIAGNOSIS — C50411 Malignant neoplasm of upper-outer quadrant of right female breast: Secondary | ICD-10-CM | POA: Diagnosis not present

## 2022-10-12 DIAGNOSIS — I10 Essential (primary) hypertension: Secondary | ICD-10-CM | POA: Insufficient documentation

## 2022-10-12 DIAGNOSIS — N958 Other specified menopausal and perimenopausal disorders: Secondary | ICD-10-CM | POA: Diagnosis not present

## 2022-10-12 DIAGNOSIS — Z7289 Other problems related to lifestyle: Secondary | ICD-10-CM | POA: Diagnosis not present

## 2022-10-12 DIAGNOSIS — K5900 Constipation, unspecified: Secondary | ICD-10-CM | POA: Insufficient documentation

## 2022-10-12 DIAGNOSIS — Z8249 Family history of ischemic heart disease and other diseases of the circulatory system: Secondary | ICD-10-CM | POA: Diagnosis not present

## 2022-10-12 DIAGNOSIS — M810 Age-related osteoporosis without current pathological fracture: Secondary | ICD-10-CM

## 2022-10-12 DIAGNOSIS — Z95828 Presence of other vascular implants and grafts: Secondary | ICD-10-CM

## 2022-10-12 LAB — CBC WITH DIFFERENTIAL (CANCER CENTER ONLY)
Abs Immature Granulocytes: 0 10*3/uL (ref 0.00–0.07)
Basophils Absolute: 0.1 10*3/uL (ref 0.0–0.1)
Basophils Relative: 1 %
Eosinophils Absolute: 0.1 10*3/uL (ref 0.0–0.5)
Eosinophils Relative: 4 %
HCT: 33.9 % — ABNORMAL LOW (ref 36.0–46.0)
Hemoglobin: 11.2 g/dL — ABNORMAL LOW (ref 12.0–15.0)
Immature Granulocytes: 0 %
Lymphocytes Relative: 35 %
Lymphs Abs: 1.3 10*3/uL (ref 0.7–4.0)
MCH: 27.2 pg (ref 26.0–34.0)
MCHC: 33 g/dL (ref 30.0–36.0)
MCV: 82.3 fL (ref 80.0–100.0)
Monocytes Absolute: 0.2 10*3/uL (ref 0.1–1.0)
Monocytes Relative: 6 %
Neutro Abs: 2 10*3/uL (ref 1.7–7.7)
Neutrophils Relative %: 54 %
Platelet Count: 216 10*3/uL (ref 150–400)
RBC: 4.12 MIL/uL (ref 3.87–5.11)
RDW: 14.4 % (ref 11.5–15.5)
WBC Count: 3.8 10*3/uL — ABNORMAL LOW (ref 4.0–10.5)
nRBC: 0 % (ref 0.0–0.2)

## 2022-10-12 LAB — CMP (CANCER CENTER ONLY)
ALT: 28 U/L (ref 0–44)
AST: 25 U/L (ref 15–41)
Albumin: 3.9 g/dL (ref 3.5–5.0)
Alkaline Phosphatase: 58 U/L (ref 38–126)
Anion gap: 6 (ref 5–15)
BUN: 25 mg/dL — ABNORMAL HIGH (ref 8–23)
CO2: 26 mmol/L (ref 22–32)
Calcium: 8.8 mg/dL — ABNORMAL LOW (ref 8.9–10.3)
Chloride: 107 mmol/L (ref 98–111)
Creatinine: 0.96 mg/dL (ref 0.44–1.00)
GFR, Estimated: >60 mL/min (ref >60)
Glucose, Bld: 84 mg/dL (ref 70–99)
Potassium: 4.1 mmol/L (ref 3.5–5.1)
Sodium: 139 mmol/L (ref 135–145)
Total Bilirubin: 0.4 mg/dL (ref 0.3–1.2)
Total Protein: 6.2 g/dL — ABNORMAL LOW (ref 6.5–8.1)

## 2022-10-12 MED ORDER — HEPARIN SOD (PORK) LOCK FLUSH 100 UNIT/ML IV SOLN
500.0000 [IU] | Freq: Once | INTRAVENOUS | Status: AC
  Administered 2022-10-12: 500 [IU]

## 2022-10-12 MED ORDER — ACETAMINOPHEN 325 MG PO TABS
650.0000 mg | ORAL_TABLET | Freq: Once | ORAL | Status: AC
  Administered 2022-10-12: 650 mg via ORAL
  Filled 2022-10-12: qty 2

## 2022-10-12 MED ORDER — SODIUM CHLORIDE 0.9% FLUSH
10.0000 mL | Freq: Once | INTRAVENOUS | Status: AC
  Administered 2022-10-12: 10 mL

## 2022-10-12 MED ORDER — TRASTUZUMAB-HYALURONIDASE-OYSK 600-10000 MG-UNT/5ML ~~LOC~~ SOLN
600.0000 mg | Freq: Once | SUBCUTANEOUS | Status: AC
  Administered 2022-10-12: 600 mg via SUBCUTANEOUS
  Filled 2022-10-12: qty 5

## 2022-10-12 NOTE — Patient Instructions (Signed)
[facility]  Discharge Instructions: Thank you for choosing [facility] to provide your oncology and hematology care.   If you have a lab appointment with the Cancer Center, please go directly to the Cancer Center and check in at the registration area.   Wear comfortable clothing and clothing appropriate for easy access to any Portacath or PICC line.   We strive to give you quality time with your provider. You may need to reschedule your appointment if you arrive late (15 or more minutes).  Arriving late affects you and other patients whose appointments are after yours.  Also, if you miss three or more appointments without notifying the office, you may be dismissed from the clinic at the provider's discretion.      For prescription refill requests, have your pharmacy contact our office and allow 72 hours for refills to be completed.    Today you received the following chemotherapy and/or immunotherapy agents: trastuzumab-hyaluronidase      To help prevent nausea and vomiting after your treatment, we encourage you to take your nausea medication as directed.  BELOW ARE SYMPTOMS THAT SHOULD BE REPORTED IMMEDIATELY: *FEVER GREATER THAN 100.4 F (38 C) OR HIGHER *CHILLS OR SWEATING *NAUSEA AND VOMITING THAT IS NOT CONTROLLED WITH YOUR NAUSEA MEDICATION *UNUSUAL SHORTNESS OF BREATH *UNUSUAL BRUISING OR BLEEDING *URINARY PROBLEMS (pain or burning when urinating, or frequent urination) *BOWEL PROBLEMS (unusual diarrhea, constipation, pain near the anus) TENDERNESS IN MOUTH AND THROAT WITH OR WITHOUT PRESENCE OF ULCERS (sore throat, sores in mouth, or a toothache) UNUSUAL RASH, SWELLING OR PAIN  UNUSUAL VAGINAL DISCHARGE OR ITCHING   Items with * indicate a potential emergency and should be followed up as soon as possible or go to the Emergency Department if any problems should occur.  Please show the CHEMOTHERAPY ALERT CARD or IMMUNOTHERAPY  ALERT CARD at check-in to the Emergency Department and triage nurse.  Should you have questions after your visit or need to cancel or reschedule your appointment, please contact [facility]  Dept: [phone]  and follow the prompts.  Office hours are 8:00 a.m. to 4:30 p.m. Monday - Friday. Please note that voicemails left after 4:00 p.m. may not be returned until the following business day.  We are closed weekends and major holidays. You have access to a nurse at all times for urgent questions. Please call the main number to the clinic Dept: [phone] and follow the prompts.   For any non-urgent questions, you may also contact your provider using MyChart. We now offer e-Visits for anyone 18 and older to request care online for non-urgent symptoms. For details visit [email or web address].   Also download the MyChart app! Go to the app store, search "MyChart", open the app, select , and log in with your MyChart username and password.  Trastuzumab; Hyaluronidase Injection What is this medication? TRASTUZUMAB; HYALURONIDASE (tras TOO zoo mab; hye al ur ON i dase) treats breast cancer. Trastuzumab works by blocking a protein that causes cancer cells to grow and multiply. This helps to slow or stop the spread of cancer cells. Hyaluronidase works by increasing the absorption of other medications in the body to help them work better. It is a combination medication that contains a monoclonal antibody. This medicine may be used for other purposes; ask your health care provider or pharmacist if you have questions. COMMON BRAND NAME(S): HERCEPTIN HYLECTA What should I tell my care team before   I take this medication? They need to know if you have any of these conditions: Heart failure Lung disease An unusual or allergic reaction to trastuzumab, or other medications, foods, dyes, or preservatives Pregnant or trying to get pregnant Breast-feeding How  should I use this medication? This medication is injected under the skin. It is given by your care team in a hospital or clinic setting. Talk to your care team about the use of this medication in children. It is not approved for use in children. Overdosage: If you think you have taken too much of this medicine contact a poison control center or emergency room at once. NOTE: This medicine is only for you. Do not share this medicine with others. What if I miss a dose? Keep appointments for follow-up doses. It is important not to miss your dose. Call your care team if you are unable to keep an appointment. What may interact with this medication? Certain types of chemotherapy, such as daunorubicin, doxorubicin, epirubicin, idarubicin This list may not describe all possible interactions. Give your health care provider a list of all the medicines, herbs, non-prescription drugs, or dietary supplements you use. Also tell them if you smoke, drink alcohol, or use illegal drugs. Some items may interact with your medicine. What should I watch for while using this medication? Your condition will be monitored carefully while you are receiving this medication. This medication may make you feel generally unwell. This is not uncommon as chemotherapy can affect healthy cells as well as cancer cells. Report any side effects. Continue your course of treatment even though you feel ill unless your care team tells you to stop. This medication may increase your risk of getting an infection. Call your care team for advice if you get a fever, chills, sore throat, or other symptoms of a cold or flu. Do not treat yourself. Try to avoid being around people who are sick. Avoid taking medications that contain aspirin, acetaminophen, ibuprofen, naproxen, or ketoprofen unless instructed by your care team. These medications may hide a fever. Talk to your care team if you may be pregnant. Serious birth defects can occur if you take  this medication during pregnancy and for 7 months after the last dose. You will need a negative pregnancy test before starting this medication. Contraception is recommended while taking this medication and for 7 months after the last dose. Your care team can help you find the option that works for you. Do not breastfeed while taking this medication or for 7 months after the last dose. What side effects may I notice from receiving this medication? Side effects that you should report to your care team as soon as possible: Allergic reactions or angioedema--skin rash, itching or hives, swelling of the face, eyes, lips, tongue, arms, or legs, trouble swallowing or breathing Dry cough, shortness of breath or trouble breathing Heart failure--shortness of breath, swelling of the ankles, feet, or hands, sudden weight gain, unusual weakness or fatigue Heart rhythm changes--fast or irregular heartbeat, dizziness, feeling faint or lightheaded, chest pain, trouble breathing Increase in blood pressure Infection--fever, chills, cough, or sore throat Side effects that usually do not require medical attention (report these to your care team if they continue or are bothersome): Diarrhea Hair loss Headache Muscle pain Nausea Unusual weakness or fatigue This list may not describe all possible side effects. Call your doctor for medical advice about side effects. You may report side effects to FDA at 1-800-FDA-1088. Where should I keep my medication? This medication   is given in a hospital or clinic. It will not be stored at home. NOTE: This sheet is a summary. It may not cover all possible information. If you have questions about this medicine, talk to your doctor, pharmacist, or health care provider.  2023 Elsevier/Gold Standard (2021-08-11 00:00:00)  

## 2022-10-12 NOTE — Assessment & Plan Note (Addendum)
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  She currently has 2 masses located at 9:30 position and 10 o'clock position each measuring 1.5 and 1.4 cm respectively.  She had right breast lumpectomy which showed 3.6 cm tumor, triple positive, positive margins, negative SLN involvement. She is going for margin excision on July 6 th. Given large tumor, triple positive, we discussed dose modified TCH for 6 cycles followed by adjuvant herceptin vs taxol with herceptin. She completed 6 cycles of TCH, now on adjuvant herceptin. She is also on adjuvant anastrozole. She is doing well,  continues to deal with constipation, and difficulty urinating. Recommend daily stool softeners and PRN laxatives for constipation.  She once again had many questions about staging, imaging for liver lesions which were found on the CT for cardiac reasons. She also is very anxious about what this means for [place]. We have ordered an MRI to evaluate these liver lesions better.  She will return to clinic in about 3 to 4 weeks to review the MRI results.  She will complete Herceptin on July 23.

## 2022-10-12 NOTE — Progress Notes (Signed)
[facility] Cancer Follow up:    Rhonda Coe, MD [address] [place] [place] [address]   DIAGNOSIS:  Cancer Staging  Malignant neoplasm of upper-outer quadrant of right breast in female, estrogen receptor positive (HCC) Staging form: Breast, AJCC 8th Edition - Clinical stage from 09/09/2021: Stage IA (cT1c, cN0, cM0, G2, ER+, PR+, HER2+) - Unsigned Stage prefix: Initial diagnosis Histologic grading system: 3 grade system - Pathologic stage from 09/15/2021: Stage IA (pT2, pN0, cM0, G1, ER+, PR+, HER2+) - Signed by Loa Socks, NP on 08/30/2022 Histologic grading system: 3 grade system   SUMMARY OF ONCOLOGIC HISTORY: Oncology History  Malignant neoplasm of upper-outer quadrant of right breast in female, estrogen receptor positive (HCC)  08/12/2021 Mammogram   Mammogram showed possible mass in the right breast.  Diagnostic mammogram showed 2 highly suspicious upper outer right breast masses measuring 1.5 cm in the 9:30 position and 1.4 cm at the 10 o'clock position.  These masses or distortions mammographically spanning a distance of up to 3.5 cm.  No abnormal appearing right axillary lymph nodes.   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  Prognostic showed ER 99% positive strong staining PR 10% positive moderate staining, HER2 positive 3+ and Ki-67 of 30%.   09/15/2021 Surgery   Right lumpectomy [facility]): IDC, grade 1, 3.6cm, margins +, 4 SLN negative.  Prior prognostic panel: ER 99%, PR 10%, HER2 +   09/15/2021 Cancer Staging   Staging form: Breast, AJCC 8th Edition - Pathologic stage from 09/15/2021: Stage IA (pT2, pN0, cM0, G1, ER+, PR+, HER2+) - Signed by Loa Socks, NP on 08/30/2022 Histologic grading system: 3 grade system   09/23/2021 Genetic Testing   Negative hereditary cancer genetic testing: no pathogenic variants detected in [facility] CustomNext-cancer +RNAinsight  Panel.  Report date is September 23, 2021.   The CustomNext-Cancer+RNAinsight panel offered by [employer] [facility] includes sequencing and rearrangement analysis for the following 47 genes:  APC, ATM, AXIN2, BARD1, BMPR1A, BRCA1, BRCA2, BRIP1, CDH1, CDK4, CDKN2A, CHEK2, DICER1, EPCAM, GREM1, HOXB13, MEN1, MLH1, MSH2, MSH3, MSH6, MUTYH, NBN, NF1, NF2, NTHL1, PALB2, PMS2, POLD1, POLE, PTEN, RAD51C, RAD51D, RECQL, RET, SDHA, SDHAF2, SDHB, SDHC, SDHD, SMAD4, SMARCA4, STK11, TP53, TSC1, TSC2, and VHL.  RNA data is routinely analyzed for use in variant interpretation for all genes.   10/15/2021 Surgery   Reexcision cleared margins.   11/10/2021 -  Chemotherapy   Patient is on Treatment Plan : BREAST Docetaxel + Carboplatin + Trastuzumab (TCH) q21d / Trastuzumab q21d     03/30/2022 - 04/28/2022 Radiation Therapy   Site Technique Total Dose (Gy) Dose per Fx (Gy) Completed Fx Beam Energies  Breast, Right: Breast_R 3D 42.56/42.56 2.66 16/16 6X, 10X  Breast, Right: Breast_R_Bst 3D 8/8 2 4/4 6X, 10X     05/2022 -  Anti-estrogen oral therapy   Anastrozole daily     CURRENT THERAPY: Herceptin/Anastrozole  INTERVAL HISTORY:  Rhonda Richardson 82 y.o. female returns for f/u prior to receiving maintenance every 3 week Herceptin.  She is tolerating this well.  She is also taking anastrozole daily with good tolerance.  She does not have difficulty urinating all the time.  However every time she is very very constipated, she notices that she has difficulty initiating the urination and pain before she urinates.  She once again has tried many laxatives.  She is very anxious because when she had the CT for the cardiac  evaluation, she was found to have some liver lesions and I am learning about this for the first time today.  On Aug 12, 2022 she underwent bilateral breast diagnostic mammogram that demonstrated no mammographic evidence of malignancy and breast density category B.  Her most recent echo occurred on 08/18/2022 and  demonstrated a LVEF of 55-60%.  Her next echo is scheduled on 11/30/2022.  Her upcoming bone density testing will occur on October 12, 2022.  Her most recent bone density was consistent with osteoporosis with a T-score of -3.0.   Patient Active Problem List   Diagnosis Date Noted   Family history of ischemic heart disease (IHD) 08/30/2022   History of dysplastic nevus 08/30/2022   Port-A-Cath in place 11/18/2021   Genetic testing 09/28/2021   Essential hypertension 09/09/2021   Malignant neoplasm of upper-outer quadrant of right breast in female, estrogen receptor positive (HCC) 09/08/2021   Gastroesophageal reflux disease 04/08/2016   Sensorineural hearing loss (SNHL), bilateral 08/13/2015    is allergic to macrodantin [nitrofurantoin].  MEDICAL HISTORY: Past Medical History:  Diagnosis Date   Breast cancer (HCC)    Hypertension     SURGICAL HISTORY: Past Surgical History:  Procedure Laterality Date   BREAST LUMPECTOMY WITH RADIOACTIVE SEED AND SENTINEL LYMPH NODE BIOPSY Right 09/15/2021   Procedure: RIGHT BREAST SEED BRACKETED LUMPECTOMY AND SENTINEL NODE BIOPSY;  Surgeon: Almond Lint, MD;  Location: [facility];  Service: General;  Laterality: Right;   cataract surgery Bilateral    DILATION AND CURETTAGE OF UTERUS  1970   EXCISION OF BREAST LESION Right 10/15/2021   Procedure: ASPIRATION RIGHT AXILLARY SEROMA;  Surgeon: Almond Lint, MD;  Location: [facility];  Service: General;  Laterality: Right;   eyelid tendon repair Bilateral    PORTACATH PLACEMENT N/A 09/15/2021   Procedure: PORT PLACEMENT;  Surgeon: Almond Lint, MD;  Location: [facility];  Service: General;  Laterality: N/A;   RE-EXCISION OF BREAST LUMPECTOMY Right 10/15/2021   Procedure: RE-EXCISION LUMPECTOMY RIGHT BREAST;  Surgeon: Almond Lint, MD;  Location: [facility];  Service: General;  Laterality: Right;    SOCIAL HISTORY: Social History   Socioeconomic History   Marital status: Married     Spouse name: Not on file   Number of children: Not on file   Years of education: Not on file   Highest education level: Not on file  Occupational History   Not on file  Tobacco Use   Smoking status: Never   Smokeless tobacco: Not on file  Vaping Use   Vaping Use: Never used  Substance and Sexual Activity   Alcohol use: Yes    Comment: 3- 4 drinks   Drug use: Never   Sexual activity: Not Currently  Other Topics Concern   Not on file  Social History Narrative   Not on file   Social Determinants of Health   Financial Resource Strain: Low Risk  (09/09/2021)   Overall Financial Resource Strain (CARDIA)    Difficulty of Paying Living Expenses: Not hard at all  Food Insecurity: No Food Insecurity (09/09/2021)   Hunger Vital Sign    Worried About Running Out of Food in the Last Year: Never true    Ran Out of Food in the Last Year: Never true  Transportation Needs: No Transportation Needs (09/09/2021)   PRAPARE - Administrator, Civil Service (Medical): No    Lack of Transportation (Non-Medical): No  Physical Activity: Not on file  Stress: Not on file  Social Connections: Not on file  Intimate Partner Violence: Not on file    FAMILY HISTORY: Family History  Problem Relation Age of Onset   Heart disease Father 27   Thyroid cancer Other 14       niece's daughter    Review of Systems  Constitutional:  Negative for appetite change, chills, fatigue, fever and unexpected weight change.  HENT:   Negative for hearing loss, lump/mass and trouble swallowing.   Eyes:  Negative for eye problems and icterus.  Respiratory:  Negative for chest tightness, cough and shortness of breath.   Cardiovascular:  Negative for chest pain, leg swelling and palpitations.  Gastrointestinal:  Negative for abdominal distention, abdominal pain, constipation, diarrhea, nausea and vomiting.  Endocrine: Negative for hot flashes.  Genitourinary:  Negative for difficulty urinating.    Musculoskeletal:  Negative for arthralgias.  Skin:  Negative for itching and rash.  Neurological:  Negative for dizziness, extremity weakness, headaches and numbness.  Hematological:  Negative for adenopathy. Does not bruise/bleed easily.  Psychiatric/Behavioral:  Negative for depression. The patient is not nervous/anxious.       PHYSICAL EXAMINATION     Vitals:   10/12/22 0926  BP: (!) 148/69  Pulse: 68  Resp: 18  SpO2: 99%    Physical Exam Constitutional:      General: She is not in acute distress.    Appearance: Normal appearance. She is not toxic-appearing.  HENT:     Head: Normocephalic and atraumatic.  Eyes:     General: No scleral icterus. Cardiovascular:     Rate and Rhythm: Normal rate and regular rhythm.     Pulses: Normal pulses.     Heart sounds: Normal heart sounds.  Pulmonary:     Effort: Pulmonary effort is normal.     Breath sounds: Normal breath sounds.  Abdominal:     General: Abdomen is flat. Bowel sounds are normal. There is no distension.     Palpations: Abdomen is soft.     Tenderness: There is no abdominal tenderness.  Musculoskeletal:        General: No swelling.     Cervical back: Neck supple.  Lymphadenopathy:     Cervical: No cervical adenopathy.  Skin:    General: Skin is warm and dry.     Findings: No rash.  Neurological:     General: No focal deficit present.     Mental Status: She is alert.  Psychiatric:        Mood and Affect: Mood normal.        Behavior: Behavior normal.     LABORATORY DATA:  CBC    Component Value Date/Time   WBC 3.8 (L) 10/12/2022 0848   WBC 6.3 10/08/2021 1129   RBC 4.12 10/12/2022 0848   HGB 11.2 (L) 10/12/2022 0848   HCT 33.9 (L) 10/12/2022 0848   HCT 34.0 06/07/2022 1031   PLT 216 10/12/2022 0848   MCV 82.3 10/12/2022 0848   MCH 27.2 10/12/2022 0848   MCHC 33.0 10/12/2022 0848   RDW 14.4 10/12/2022 0848   LYMPHSABS 1.3 10/12/2022 0848   MONOABS 0.2 10/12/2022 0848   EOSABS 0.1  10/12/2022 0848   BASOSABS 0.1 10/12/2022 0848    CMP     Component Value Date/Time   NA 139 10/12/2022 0848   K 4.1 10/12/2022 0848   CL 107 10/12/2022 0848   CO2 26 10/12/2022 0848   GLUCOSE 84 10/12/2022 0848   BUN 25 (H) 10/12/2022 0848   CREATININE  0.96 10/12/2022 0848   CALCIUM 8.8 (L) 10/12/2022 0848   PROT 6.2 (L) 10/12/2022 0848   ALBUMIN 3.9 10/12/2022 0848   AST 25 10/12/2022 0848   ALT 28 10/12/2022 0848   ALKPHOS 58 10/12/2022 0848   BILITOT 0.4 10/12/2022 0848   GFRNONAA >60 10/12/2022 0848    ASSESSMENT and THERAPY PLAN:   Malignant neoplasm of upper-outer quadrant of right breast in female, estrogen receptor positive (HCC) This is a very pleasant 82 year old female patient with past medical history significant for hypertension, otherwise healthy referred to breast [facility] with 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  She currently has 2 masses located at 9:30 position and 10 o'clock position each measuring 1.5 and 1.4 cm respectively.  She had right breast lumpectomy which showed 3.6 cm tumor, triple positive, positive margins, negative SLN involvement. She is going for margin excision on July 6 th. Given large tumor, triple positive, we discussed dose modified TCH for 6 cycles followed by adjuvant herceptin vs taxol with herceptin. She completed 6 cycles of TCH, now on adjuvant herceptin. She is also on adjuvant anastrozole. She is doing well,  continues to deal with constipation, and difficulty urinating. Recommend daily stool softeners and PRN laxatives for constipation.  She once again had many questions about staging, imaging for liver lesions which were found on the CT for cardiac reasons. She also is very anxious about what this means for [place]. We have ordered an MRI to evaluate these liver lesions better.  She will return to clinic in about 3 to 4 weeks to review the MRI results.  She will complete Herceptin on July 23.   All  questions were answered. The patient knows to call the clinic with any problems, questions or concerns. We can certainly see the patient much sooner if necessary.  Total encounter time:30 minutes*in face-to-face visit time, chart review, lab review, care coordination, order entry, and documentation of the encounter time.   *Total Encounter Time as defined by the Centers for Medicare and Medicaid Services includes, in addition to the face-to-face time of a patient visit (documented in the note above) non-face-to-face time: obtaining and reviewing outside history, ordering and reviewing medications, tests or procedures, care coordination (communications with other health care professionals or caregivers) and documentation in the medical record.

## 2022-10-13 ENCOUNTER — Telehealth: Payer: Self-pay | Admitting: Hematology and Oncology

## 2022-10-13 NOTE — Telephone Encounter (Signed)
Spoke with patient confirming upcoming appointment  

## 2022-10-23 ENCOUNTER — Ambulatory Visit (HOSPITAL_COMMUNITY)
Admission: RE | Admit: 2022-10-23 | Discharge: 2022-10-23 | Disposition: A | Discharge status: Home / Self Care | Payer: Medicare Other | Source: Ambulatory Visit | Attending: Hematology and Oncology | Admitting: Hematology and Oncology

## 2022-10-23 DIAGNOSIS — K769 Liver disease, unspecified: Secondary | ICD-10-CM | POA: Diagnosis not present

## 2022-10-23 DIAGNOSIS — Z170 Estrogen receptor positive status [ER+]: Secondary | ICD-10-CM | POA: Insufficient documentation

## 2022-10-23 DIAGNOSIS — C50411 Malignant neoplasm of upper-outer quadrant of right female breast: Secondary | ICD-10-CM | POA: Diagnosis not present

## 2022-10-23 MED ORDER — GADOBUTROL 1 MMOL/ML IV SOLN
6.5000 mL | Freq: Once | INTRAVENOUS | Status: AC | PRN
  Administered 2022-10-23: 6.5 mL via INTRAVENOUS

## 2022-10-25 ENCOUNTER — Ambulatory Visit (HOSPITAL_COMMUNITY): Payer: Medicare Other

## 2022-10-26 DIAGNOSIS — K219 Gastro-esophageal reflux disease without esophagitis: Secondary | ICD-10-CM | POA: Diagnosis not present

## 2022-10-26 DIAGNOSIS — I10 Essential (primary) hypertension: Secondary | ICD-10-CM | POA: Diagnosis not present

## 2022-10-26 DIAGNOSIS — M810 Age-related osteoporosis without current pathological fracture: Secondary | ICD-10-CM | POA: Diagnosis not present

## 2022-10-26 DIAGNOSIS — E7800 Pure hypercholesterolemia, unspecified: Secondary | ICD-10-CM | POA: Diagnosis not present

## 2022-10-27 DIAGNOSIS — R310 Gross hematuria: Secondary | ICD-10-CM | POA: Diagnosis not present

## 2022-10-27 DIAGNOSIS — R3916 Straining to void: Secondary | ICD-10-CM | POA: Diagnosis not present

## 2022-10-27 DIAGNOSIS — R3911 Hesitancy of micturition: Secondary | ICD-10-CM | POA: Diagnosis not present

## 2022-11-01 ENCOUNTER — Ambulatory Visit: Payer: Medicare Other

## 2022-11-01 ENCOUNTER — Ambulatory Visit: Payer: Medicare Other | Admitting: Adult Health

## 2022-11-02 ENCOUNTER — Inpatient Hospital Stay (HOSPITAL_BASED_OUTPATIENT_CLINIC_OR_DEPARTMENT_OTHER): Payer: Medicare Other | Admitting: Adult Health

## 2022-11-02 ENCOUNTER — Inpatient Hospital Stay: Payer: Medicare Other | Admitting: Dietician

## 2022-11-02 ENCOUNTER — Inpatient Hospital Stay: Payer: Medicare Other

## 2022-11-02 ENCOUNTER — Encounter: Payer: Self-pay | Admitting: Adult Health

## 2022-11-02 ENCOUNTER — Other Ambulatory Visit: Payer: Self-pay

## 2022-11-02 ENCOUNTER — Ambulatory Visit: Payer: Medicare Other

## 2022-11-02 VITALS — BP 120/60 | HR 92 | Temp 98.0°F | Resp 18 | Ht 62.0 in | Wt 140.6 lb

## 2022-11-02 DIAGNOSIS — I10 Essential (primary) hypertension: Secondary | ICD-10-CM | POA: Diagnosis not present

## 2022-11-02 DIAGNOSIS — Z7289 Other problems related to lifestyle: Secondary | ICD-10-CM | POA: Diagnosis not present

## 2022-11-02 DIAGNOSIS — K5900 Constipation, unspecified: Secondary | ICD-10-CM | POA: Diagnosis not present

## 2022-11-02 DIAGNOSIS — C50411 Malignant neoplasm of upper-outer quadrant of right female breast: Secondary | ICD-10-CM | POA: Diagnosis not present

## 2022-11-02 DIAGNOSIS — Z170 Estrogen receptor positive status [ER+]: Secondary | ICD-10-CM | POA: Diagnosis not present

## 2022-11-02 DIAGNOSIS — Z5112 Encounter for antineoplastic immunotherapy: Secondary | ICD-10-CM | POA: Diagnosis not present

## 2022-11-02 MED ORDER — ACETAMINOPHEN 325 MG PO TABS
650.0000 mg | ORAL_TABLET | Freq: Once | ORAL | Status: AC
  Administered 2022-11-02: 650 mg via ORAL
  Filled 2022-11-02: qty 2

## 2022-11-02 MED ORDER — TRASTUZUMAB-HYALURONIDASE-OYSK 600-10000 MG-UNT/5ML ~~LOC~~ SOLN
600.0000 mg | Freq: Once | SUBCUTANEOUS | Status: AC
  Administered 2022-11-02: 600 mg via SUBCUTANEOUS
  Filled 2022-11-02: qty 5

## 2022-11-02 NOTE — Progress Notes (Signed)
Nutrition Assessment   Reason for Assessment: Patient request   ASSESSMENT: 82 year old female with breast cancer, estrogen receptor positive. She completed chemotherapy followed by radiation therapy (01/24). Patient is currently receiving Herceptin + Anastrozole.   Met with patient in infusion. She reports tolerating therapy well overall. Patient has a history of constipation since starting treatment. This is managed well with colace. Patient reports family history of heart disease. She was recently started on a statin per cardiologist. Patient requesting dietary recommendations for management of cholesterol/heart healthy lifestyle.   Nutrition Focused Physical Exam: deferred   Medications: lotensin, D3, pepcid, microzide, crestor, MVI, colace   Labs: BUN 25   Anthropometrics:   Height: 5'2" Weight: 140 lb 9.6 oz UBW: 138-140 lb  BMI: 25.72    NUTRITION DIAGNOSIS: Food and nutrition related knowledge deficit related to chronic illness as evidenced by no prior need for associated nutrition information    INTERVENTION:  Educated on plant-based diet  Encouraged activity as able Continue colace for constipation Contact information given    MONITORING, EVALUATION, GOAL: Patient will tolerate adequate calories and protein to minimize wt loss during treatment    Next Visit: No follow-up scheduled. Patient encouraged to contact with nutrition questions/concerns

## 2022-11-02 NOTE — Progress Notes (Signed)
[facility] Cancer Follow up:    Irven Coe, MD [address] [place] [address]   DIAGNOSIS:  Cancer Staging  Malignant neoplasm of upper-outer quadrant of right breast in female, estrogen receptor positive (HCC) Staging form: Breast, AJCC 8th Edition - Clinical stage from 09/09/2021: Stage IA (cT1c, cN0, cM0, G2, ER+, PR+, HER2+) - Unsigned Stage prefix: Initial diagnosis Histologic grading system: 3 grade system - Pathologic stage from 09/15/2021: Stage IA (pT2, pN0, cM0, G1, ER+, PR+, HER2+) - Signed by Loa Socks, NP on 08/30/2022 Histologic grading system: 3 grade system   SUMMARY OF ONCOLOGIC HISTORY: Oncology History  Malignant neoplasm of upper-outer quadrant of right breast in female, estrogen receptor positive (HCC)  08/12/2021 Mammogram   Mammogram showed possible mass in the right breast.  Diagnostic mammogram showed 2 highly suspicious upper outer right breast masses measuring 1.5 cm in the 9:30 position and 1.4 cm at the 10 o'clock position.  These masses or distortions mammographically spanning a distance of up to 3.5 cm.  No abnormal appearing right axillary lymph nodes.   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  Prognostic showed ER 99% positive strong staining PR 10% positive moderate staining, HER2 positive 3+ and Ki-67 of 30%.   09/15/2021 Surgery   Right lumpectomy [facility]): IDC, grade 1, 3.6cm, margins +, 4 SLN negative.  Prior prognostic panel: ER 99%, PR 10%, HER2 +   09/15/2021 Cancer Staging   Staging form: Breast, AJCC 8th Edition - Pathologic stage from 09/15/2021: Stage IA (pT2, pN0, cM0, G1, ER+, PR+, HER2+) - Signed by Loa Socks, NP on 08/30/2022 Histologic grading system: 3 grade system   09/23/2021 Genetic Testing   Negative hereditary cancer genetic testing: no pathogenic variants detected in [facility] CustomNext-cancer  +RNAinsight Panel.  Report date is September 23, 2021.   The CustomNext-Cancer+RNAinsight panel offered by [employer] includes sequencing and rearrangement analysis for the following 47 genes:  APC, ATM, AXIN2, BARD1, BMPR1A, BRCA1, BRCA2, BRIP1, CDH1, CDK4, CDKN2A, CHEK2, DICER1, EPCAM, GREM1, HOXB13, MEN1, MLH1, MSH2, MSH3, MSH6, MUTYH, NBN, NF1, NF2, NTHL1, PALB2, PMS2, POLD1, POLE, PTEN, RAD51C, RAD51D, RECQL, RET, SDHA, SDHAF2, SDHB, SDHC, SDHD, SMAD4, SMARCA4, STK11, TP53, TSC1, TSC2, and VHL.  RNA data is routinely analyzed for use in variant interpretation for all genes.   10/15/2021 Surgery   Reexcision cleared margins.   11/10/2021 -  Chemotherapy   Patient is on Treatment Plan : BREAST Docetaxel + Carboplatin + Trastuzumab (TCH) q21d / Trastuzumab q21d     03/30/2022 - 04/28/2022 Radiation Therapy   Site Technique Total Dose (Gy) Dose per Fx (Gy) Completed Fx Beam Energies  Breast, Right: Breast_R 3D 42.56/42.56 2.66 16/16 6X, 10X  Breast, Right: Breast_R_Bst 3D 8/8 2 4/4 6X, 10X     05/2022 -  Anti-estrogen oral therapy   Anastrozole daily     CURRENT THERAPY: Anastrozole/herceptin  INTERVAL HISTORY: Rhonda Richardson 82 y.o. female returns for follow up on Anastrozole and Herceptin with good tolerance.  She notes today is her last treatment with Herceptin.  Her next echocardiogram is scheduled on November 30, 2022.  She follows with Dr. Gala Romney and Dr. Shirlee Latch in the [facility] health heart and vascular specialty clinic.  Patient Active Problem List   Diagnosis Date Noted   Family history of ischemic heart disease (IHD) 08/30/2022   History of dysplastic nevus 08/30/2022   Port-A-Cath in place 11/18/2021  Genetic testing 09/28/2021   Essential hypertension 09/09/2021   Malignant neoplasm of upper-outer quadrant of right breast in female, estrogen receptor positive (HCC) 09/08/2021   Gastroesophageal reflux disease 04/08/2016   Sensorineural hearing loss (SNHL), bilateral 08/13/2015     is allergic to macrodantin [nitrofurantoin].  MEDICAL HISTORY: Past Medical History:  Diagnosis Date   Breast cancer (HCC)    Hypertension     SURGICAL HISTORY: Past Surgical History:  Procedure Laterality Date   BREAST LUMPECTOMY WITH RADIOACTIVE SEED AND SENTINEL LYMPH NODE BIOPSY Right 09/15/2021   Procedure: RIGHT BREAST SEED BRACKETED LUMPECTOMY AND SENTINEL NODE BIOPSY;  Surgeon: Almond Lint, MD;  Location: [facility];  Service: General;  Laterality: Right;   cataract surgery Bilateral    DILATION AND CURETTAGE OF UTERUS  1970   EXCISION OF BREAST LESION Right 10/15/2021   Procedure: ASPIRATION RIGHT AXILLARY SEROMA;  Surgeon: Almond Lint, MD;  Location: [facility] OR;  Service: General;  Laterality: Right;   eyelid tendon repair Bilateral    PORTACATH PLACEMENT N/A 09/15/2021   Procedure: PORT PLACEMENT;  Surgeon: Almond Lint, MD;  Location: [facility];  Service: General;  Laterality: N/A;   RE-EXCISION OF BREAST LUMPECTOMY Right 10/15/2021   Procedure: RE-EXCISION LUMPECTOMY RIGHT BREAST;  Surgeon: Almond Lint, MD;  Location: [facility] OR;  Service: General;  Laterality: Right;    SOCIAL HISTORY: Social History   Socioeconomic History   Marital status: Married    Spouse name: Not on file   Number of children: Not on file   Years of education: Not on file   Highest education level: Not on file  Occupational History   Not on file  Tobacco Use   Smoking status: Never   Smokeless tobacco: Not on file  Vaping Use   Vaping status: Never Used  Substance and Sexual Activity   Alcohol use: Yes    Comment: 3- 4 drinks   Drug use: Never   Sexual activity: Not Currently  Other Topics Concern   Not on file  Social History Narrative   Not on file   Social Determinants of Health   Financial Resource Strain: Low Risk  (09/09/2021)   Overall Financial Resource Strain (CARDIA)    Difficulty of Paying Living Expenses: Not hard at all  Food Insecurity:  No Food Insecurity (09/09/2021)   Hunger Vital Sign    Worried About Running Out of Food in the Last Year: Never true    Ran Out of Food in the Last Year: Never true  Transportation Needs: No Transportation Needs (09/09/2021)   PRAPARE - Administrator, Civil Service (Medical): No    Lack of Transportation (Non-Medical): No  Physical Activity: Not on file  Stress: Not on file  Social Connections: Not on file  Intimate Partner Violence: Not on file    FAMILY HISTORY: Family History  Problem Relation Age of Onset   Heart disease Father 51   Thyroid cancer Other 14       niece's daughter    Review of Systems  Constitutional:  Negative for appetite change, chills, fatigue, fever and unexpected weight change.  HENT:   Negative for hearing loss, lump/mass and trouble swallowing.   Eyes:  Negative for eye problems and icterus.  Respiratory:  Negative for chest tightness, cough and shortness of breath.   Cardiovascular:  Negative for chest pain, leg swelling and palpitations.  Gastrointestinal:  Negative for abdominal distention, abdominal pain, constipation, diarrhea, nausea and vomiting.  Endocrine:  Negative for hot flashes.  Genitourinary:  Negative for difficulty urinating.   Musculoskeletal:  Negative for arthralgias.  Skin:  Negative for itching and rash.  Neurological:  Negative for dizziness, extremity weakness, headaches and numbness.  Hematological:  Negative for adenopathy. Does not bruise/bleed easily.  Psychiatric/Behavioral:  Negative for depression. The patient is not nervous/anxious.       PHYSICAL EXAMINATION     Vitals:   11/02/22 1001  BP: 120/60  Pulse: 92  Resp: 18  Temp: 98 F (36.7 C)  SpO2: 97%    Physical Exam Constitutional:      General: She is not in acute distress.    Appearance: Normal appearance. She is not toxic-appearing.  HENT:     Head: Normocephalic and atraumatic.     Mouth/Throat:     Mouth: Mucous membranes are  moist.     Pharynx: Oropharynx is clear. No oropharyngeal exudate or posterior oropharyngeal erythema.  Eyes:     General: No scleral icterus. Cardiovascular:     Rate and Rhythm: Normal rate and regular rhythm.     Pulses: Normal pulses.     Heart sounds: Normal heart sounds.  Pulmonary:     Effort: Pulmonary effort is normal.     Breath sounds: Normal breath sounds.  Abdominal:     General: Abdomen is flat. Bowel sounds are normal. There is no distension.     Palpations: Abdomen is soft.     Tenderness: There is no abdominal tenderness.  Musculoskeletal:        General: No swelling.     Cervical back: Neck supple.  Lymphadenopathy:     Cervical: No cervical adenopathy.  Skin:    General: Skin is warm and dry.     Findings: No rash.  Neurological:     General: No focal deficit present.     Mental Status: She is alert.  Psychiatric:        Mood and Affect: Mood normal.        Behavior: Behavior normal.     LABORATORY DATA:  CBC    Component Value Date/Time   WBC 3.8 (L) 10/12/2022 0848   WBC 6.3 10/08/2021 1129   RBC 4.12 10/12/2022 0848   HGB 11.2 (L) 10/12/2022 0848   HCT 33.9 (L) 10/12/2022 0848   HCT 34.0 06/07/2022 1031   PLT 216 10/12/2022 0848   MCV 82.3 10/12/2022 0848   MCH 27.2 10/12/2022 0848   MCHC 33.0 10/12/2022 0848   RDW 14.4 10/12/2022 0848   LYMPHSABS 1.3 10/12/2022 0848   MONOABS 0.2 10/12/2022 0848   EOSABS 0.1 10/12/2022 0848   BASOSABS 0.1 10/12/2022 0848    CMP     Component Value Date/Time   NA 139 10/12/2022 0848   K 4.1 10/12/2022 0848   CL 107 10/12/2022 0848   CO2 26 10/12/2022 0848   GLUCOSE 84 10/12/2022 0848   BUN 25 (H) 10/12/2022 0848   CREATININE 0.96 10/12/2022 0848   CALCIUM 8.8 (L) 10/12/2022 0848   PROT 6.2 (L) 10/12/2022 0848   ALBUMIN 3.9 10/12/2022 0848   AST 25 10/12/2022 0848   ALT 28 10/12/2022 0848   ALKPHOS 58 10/12/2022 0848   BILITOT 0.4 10/12/2022 0848   GFRNONAA >60 10/12/2022 0848        PENDING LABS:   RADIOGRAPHIC STUDIES:  No results found.   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    Current treatment: Herceptin and Anastrozole  Right breast stage IA IDC: She will continue on Herceptin and anastrozole.  She is tolerating these well.  She has no clinical or radiographic signs of breast cancer recurrence.  Her next mammogram will be due in May 2025. At risk for heart failure: Most recent echo: 08/18/2022-EF 55-60% (followed by Dr. Gala Romney) next appointment for repeat echo is in August 2024.  Rhonda Richardson will continue with her current treatment.  We will see her back in 04/2023 for her survivorship care plan visit.   All questions were answered. The patient knows to call the clinic with any problems, questions or concerns. We can certainly see the patient much sooner if necessary.  Total encounter time:20 minutes*in face-to-face visit time, chart review, lab review, care coordination, order entry, and documentation of the encounter time.    Lillard Anes, NP 11/05/22 6:19 PM Medical Oncology and Hematology [facility] [address] [place], [place] [address] Tel. [phone]    Fax. [phone]  *Total Encounter Time as defined by the Centers for Medicare and Medicaid Services includes, in addition to the face-to-face time of a patient visit (documented in the note above) non-face-to-face time: obtaining and reviewing outside history, ordering and reviewing medications, tests or procedures, care coordination (communications with other health care professionals or  caregivers) and documentation in the medical record.

## 2022-11-02 NOTE — Patient Instructions (Signed)
[facility]  Discharge Instructions: Thank you for choosing [facility] to provide your oncology and hematology care.   If you have a lab appointment with the Cancer Center, please go directly to the Cancer Center and check in at the registration area.   Wear comfortable clothing and clothing appropriate for easy access to any Portacath or PICC line.   We strive to give you quality time with your provider. You may need to reschedule your appointment if you arrive late (15 or more minutes).  Arriving late affects you and other patients whose appointments are after yours.  Also, if you miss three or more appointments without notifying the office, you may be dismissed from the clinic at the provider's discretion.      For prescription refill requests, have your pharmacy contact our office and allow 72 hours for refills to be completed.    Today you received the following chemotherapy and/or immunotherapy agents: trastuzumab-hyaluronidase      To help prevent nausea and vomiting after your treatment, we encourage you to take your nausea medication as directed.  BELOW ARE SYMPTOMS THAT SHOULD BE REPORTED IMMEDIATELY: *FEVER GREATER THAN 100.4 F (38 C) OR HIGHER *CHILLS OR SWEATING *NAUSEA AND VOMITING THAT IS NOT CONTROLLED WITH YOUR NAUSEA MEDICATION *UNUSUAL SHORTNESS OF BREATH *UNUSUAL BRUISING OR BLEEDING *URINARY PROBLEMS (pain or burning when urinating, or frequent urination) *BOWEL PROBLEMS (unusual diarrhea, constipation, pain near the anus) TENDERNESS IN MOUTH AND THROAT WITH OR WITHOUT PRESENCE OF ULCERS (sore throat, sores in mouth, or a toothache) UNUSUAL RASH, SWELLING OR PAIN  UNUSUAL VAGINAL DISCHARGE OR ITCHING   Items with * indicate a potential emergency and should be followed up as soon as possible or go to the Emergency Department if any problems should occur.  Please show the CHEMOTHERAPY ALERT CARD or IMMUNOTHERAPY  ALERT CARD at check-in to the Emergency Department and triage nurse.  Should you have questions after your visit or need to cancel or reschedule your appointment, please contact [facility]  Dept: [phone]  and follow the prompts.  Office hours are 8:00 a.m. to 4:30 p.m. Monday - Friday. Please note that voicemails left after 4:00 p.m. may not be returned until the following business day.  We are closed weekends and major holidays. You have access to a nurse at all times for urgent questions. Please call the main number to the clinic Dept: [phone] and follow the prompts.   For any non-urgent questions, you may also contact your provider using MyChart. We now offer e-Visits for anyone 18 and older to request care online for non-urgent symptoms. For details visit [email or web address].   Also download the MyChart app! Go to the app store, search "MyChart", open the app, select , and log in with your MyChart username and password.  Trastuzumab; Hyaluronidase Injection What is this medication? TRASTUZUMAB; HYALURONIDASE (tras TOO zoo mab; hye al ur ON i dase) treats breast cancer. Trastuzumab works by blocking a protein that causes cancer cells to grow and multiply. This helps to slow or stop the spread of cancer cells. Hyaluronidase works by increasing the absorption of other medications in the body to help them work better. It is a combination medication that contains a monoclonal antibody. This medicine may be used for other purposes; ask your health care provider or pharmacist if you have questions. COMMON BRAND NAME(S): HERCEPTIN HYLECTA What should I tell my care team before   I take this medication? They need to know if you have any of these conditions: Heart failure Lung disease An unusual or allergic reaction to trastuzumab, or other medications, foods, dyes, or preservatives Pregnant or trying to get pregnant Breast-feeding How  should I use this medication? This medication is injected under the skin. It is given by your care team in a hospital or clinic setting. Talk to your care team about the use of this medication in children. It is not approved for use in children. Overdosage: If you think you have taken too much of this medicine contact a poison control center or emergency room at once. NOTE: This medicine is only for you. Do not share this medicine with others. What if I miss a dose? Keep appointments for follow-up doses. It is important not to miss your dose. Call your care team if you are unable to keep an appointment. What may interact with this medication? Certain types of chemotherapy, such as daunorubicin, doxorubicin, epirubicin, idarubicin This list may not describe all possible interactions. Give your health care provider a list of all the medicines, herbs, non-prescription drugs, or dietary supplements you use. Also tell them if you smoke, drink alcohol, or use illegal drugs. Some items may interact with your medicine. What should I watch for while using this medication? Your condition will be monitored carefully while you are receiving this medication. This medication may make you feel generally unwell. This is not uncommon as chemotherapy can affect healthy cells as well as cancer cells. Report any side effects. Continue your course of treatment even though you feel ill unless your care team tells you to stop. This medication may increase your risk of getting an infection. Call your care team for advice if you get a fever, chills, sore throat, or other symptoms of a cold or flu. Do not treat yourself. Try to avoid being around people who are sick. Avoid taking medications that contain aspirin, acetaminophen, ibuprofen, naproxen, or ketoprofen unless instructed by your care team. These medications may hide a fever. Talk to your care team if you may be pregnant. Serious birth defects can occur if you take  this medication during pregnancy and for 7 months after the last dose. You will need a negative pregnancy test before starting this medication. Contraception is recommended while taking this medication and for 7 months after the last dose. Your care team can help you find the option that works for you. Do not breastfeed while taking this medication or for 7 months after the last dose. What side effects may I notice from receiving this medication? Side effects that you should report to your care team as soon as possible: Allergic reactions or angioedema--skin rash, itching or hives, swelling of the face, eyes, lips, tongue, arms, or legs, trouble swallowing or breathing Dry cough, shortness of breath or trouble breathing Heart failure--shortness of breath, swelling of the ankles, feet, or hands, sudden weight gain, unusual weakness or fatigue Heart rhythm changes--fast or irregular heartbeat, dizziness, feeling faint or lightheaded, chest pain, trouble breathing Increase in blood pressure Infection--fever, chills, cough, or sore throat Side effects that usually do not require medical attention (report these to your care team if they continue or are bothersome): Diarrhea Hair loss Headache Muscle pain Nausea Unusual weakness or fatigue This list may not describe all possible side effects. Call your doctor for medical advice about side effects. You may report side effects to FDA at 1-800-FDA-1088. Where should I keep my medication? This medication   is given in a hospital or clinic. It will not be stored at home. NOTE: This sheet is a summary. It may not cover all possible information. If you have questions about this medicine, talk to your doctor, pharmacist, or health care provider.  2023 Elsevier/Gold Standard (2021-08-11 00:00:00)  

## 2022-11-03 ENCOUNTER — Encounter: Payer: Self-pay | Admitting: Hematology and Oncology

## 2022-11-03 ENCOUNTER — Telehealth: Payer: Self-pay | Admitting: Adult Health

## 2022-11-03 ENCOUNTER — Encounter: Payer: Self-pay | Admitting: Physician Assistant

## 2022-11-03 NOTE — Telephone Encounter (Signed)
Scheduled appointment per 7/23 los. Patient is aware of the made appointments.

## 2022-11-04 ENCOUNTER — Encounter: Payer: Self-pay | Admitting: *Deleted

## 2022-11-04 ENCOUNTER — Other Ambulatory Visit: Payer: Self-pay

## 2022-11-04 DIAGNOSIS — Z170 Estrogen receptor positive status [ER+]: Secondary | ICD-10-CM

## 2022-11-05 ENCOUNTER — Encounter: Payer: Self-pay | Admitting: Hematology and Oncology

## 2022-11-05 ENCOUNTER — Encounter: Payer: Self-pay | Admitting: Physician Assistant

## 2022-11-05 NOTE — Assessment & Plan Note (Signed)
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    Current treatment: Herceptin and Anastrozole  Right breast stage IA IDC: She will continue on Herceptin and anastrozole.  She is tolerating these well.  She has no clinical or radiographic signs of breast cancer recurrence.  Her next mammogram will be due in May 2025. At risk for heart failure: Most recent echo: 08/18/2022-EF 55-60% (followed by Dr. Gala Romney) next appointment for repeat echo is in August 2024.  Rhonda Richardson will continue with her current treatment.  We will see her back in 04/2023 for her survivorship care plan visit.

## 2022-11-16 DIAGNOSIS — R310 Gross hematuria: Secondary | ICD-10-CM | POA: Diagnosis not present

## 2022-11-16 DIAGNOSIS — K7689 Other specified diseases of liver: Secondary | ICD-10-CM | POA: Diagnosis not present

## 2022-11-21 ENCOUNTER — Other Ambulatory Visit: Payer: Self-pay

## 2022-11-30 ENCOUNTER — Ambulatory Visit (HOSPITAL_BASED_OUTPATIENT_CLINIC_OR_DEPARTMENT_OTHER)
Admission: RE | Admit: 2022-11-30 | Discharge: 2022-11-30 | Disposition: A | Discharge status: Home / Self Care | Payer: Medicare Other | Source: Ambulatory Visit | Attending: Internal Medicine | Admitting: Internal Medicine

## 2022-11-30 ENCOUNTER — Encounter (HOSPITAL_COMMUNITY): Payer: Self-pay | Admitting: Internal Medicine

## 2022-11-30 ENCOUNTER — Ambulatory Visit (HOSPITAL_COMMUNITY)
Admission: RE | Admit: 2022-11-30 | Discharge: 2022-11-30 | Disposition: A | Discharge status: Home / Self Care | Payer: Medicare Other | Source: Ambulatory Visit | Attending: Family Medicine | Admitting: Family Medicine

## 2022-11-30 VITALS — BP 124/70 | HR 81 | Wt 140.8 lb

## 2022-11-30 DIAGNOSIS — R931 Abnormal findings on diagnostic imaging of heart and coronary circulation: Secondary | ICD-10-CM

## 2022-11-30 DIAGNOSIS — Z5181 Encounter for therapeutic drug level monitoring: Secondary | ICD-10-CM | POA: Insufficient documentation

## 2022-11-30 DIAGNOSIS — Z8249 Family history of ischemic heart disease and other diseases of the circulatory system: Secondary | ICD-10-CM

## 2022-11-30 DIAGNOSIS — Z170 Estrogen receptor positive status [ER+]: Secondary | ICD-10-CM | POA: Insufficient documentation

## 2022-11-30 DIAGNOSIS — C50411 Malignant neoplasm of upper-outer quadrant of right female breast: Secondary | ICD-10-CM

## 2022-11-30 DIAGNOSIS — R9430 Abnormal result of cardiovascular function study, unspecified: Secondary | ICD-10-CM | POA: Diagnosis not present

## 2022-11-30 DIAGNOSIS — I110 Hypertensive heart disease with heart failure: Secondary | ICD-10-CM | POA: Insufficient documentation

## 2022-11-30 DIAGNOSIS — I10 Essential (primary) hypertension: Secondary | ICD-10-CM | POA: Diagnosis not present

## 2022-11-30 DIAGNOSIS — I340 Nonrheumatic mitral (valve) insufficiency: Secondary | ICD-10-CM | POA: Diagnosis not present

## 2022-11-30 DIAGNOSIS — I509 Heart failure, unspecified: Secondary | ICD-10-CM | POA: Insufficient documentation

## 2022-11-30 DIAGNOSIS — Z79899 Other long term (current) drug therapy: Secondary | ICD-10-CM | POA: Insufficient documentation

## 2022-11-30 DIAGNOSIS — M25519 Pain in unspecified shoulder: Secondary | ICD-10-CM | POA: Insufficient documentation

## 2022-11-30 DIAGNOSIS — M25512 Pain in left shoulder: Secondary | ICD-10-CM

## 2022-11-30 DIAGNOSIS — I083 Combined rheumatic disorders of mitral, aortic and tricuspid valves: Secondary | ICD-10-CM | POA: Insufficient documentation

## 2022-11-30 LAB — ECHOCARDIOGRAM COMPLETE
AR max vel: 2.45 cm2
AV Peak grad: 4.5 mmHg
Ao pk vel: 1.06 m/s
Area-P 1/2: 3.31 cm2
S' Lateral: 2.7 cm

## 2022-11-30 MED ORDER — METOPROLOL TARTRATE 100 MG PO TABS: 100.0000 mg | ORAL_TABLET | Freq: Once | ORAL | 0 refills | Status: DC

## 2022-11-30 NOTE — Patient Instructions (Signed)
   Your cardiac CT will be scheduled at one of the below locations:   [facility] [address] [place], [place] [address] [phone]  If scheduled at [facility], please arrive at the [facility] Entrance (Entrance C2) of [facility] 30 minutes prior to test start time. You can use the FREE valet parking offered at entrance C (encouraged to control the heart rate for the test)  Proceed to the [facility] Radiology Department (first floor) to check-in and test prep.  All radiology patients and guests should use entrance C2 at [facility], accessed from [facility], even though the hospital's physical address listed is [address].    Please follow these instructions carefully (unless otherwise directed):  An IV will be required for this test and Nitroglycerin will be given.  Hold all erectile dysfunction medications at least 3 days (72 hrs) prior to test. (Ie viagra, cialis, sildenafil, tadalafil, etc)   On the Night Before the Test: Be sure to Drink plenty of water. Do not consume any caffeinated/decaffeinated beverages or chocolate 12 hours prior to your test. Do not take any antihistamines 12 hours prior to your test.  On the Day of the Test: Drink plenty of water until 1 hour prior to the test. Do not eat any food 1 hour prior to test. You may take your regular medications prior to the test.  Take metoprolol (Lopressor) 100 mg two hours prior to test. If you take Furosemide/Hydrochlorothiazide/Spironolactone, please HOLD on the morning of the test. FEMALES- please wear underwire-free bra if available, avoid dresses & tight clothing     After the Test: Drink plenty of water. After receiving IV contrast, you may experience a mild flushed feeling. This is normal. On occasion, you may experience a mild rash up to 24 hours after the test. This is not dangerous. If this occurs, you can take Benadryl 25 mg and  increase your fluid intake. If you experience trouble breathing, this can be serious. If it is severe call 911 IMMEDIATELY. If it is mild, please call our office. If you take any of these medications: Glipizide/Metformin, Avandament, Glucavance, please do not take 48 hours after completing test unless otherwise instructed.  We will call to schedule your test 2-4 weeks out understanding that some insurance companies will need an authorization prior to the service being performed.   For more information and frequently asked questions, please visit our website : [email or web address]  For non-scheduling related questions, please contact the cardiac imaging nurse navigator should you have any questions/concerns: Cardiac Imaging Nurse Navigators Direct Office Dial: [phone]   For scheduling needs, including cancellations and rescheduling, please call [place], [phone].    Congratulations!!! You have graduated the Heart Failure Clinic,  You have been referred to Dr Cristal Deer at [facility] for your future Cardiac needs, her office will call you for an appointment.

## 2022-11-30 NOTE — Progress Notes (Signed)
Echocardiogram 2D Echocardiogram has been performed.  [name] N Harue Pribble,RDCS 11/30/2022, 11:53 AM

## 2022-11-30 NOTE — Progress Notes (Signed)
CARDIO-ONCOLOGY CLINIC NOTE  Referring Physician: Dr. Al Pimple Primary Care: Irven Coe, MD Primary Cardiologist: New  HPI:  Ms. Sarsour is 82 y.o. female with HTN, right breast cancer referred by Dr. Al Pimple for further evaluation of abnormal echo.   Diagnosed with R breast CA in 5/23 ER + PR - HER2 +. In 6/23 had lumpectomy with positive margins. LNs clear. 7/23 re-excision of margins.   In 8/23 stated chemo with docetaxel + Carboplatin + Trastuzumab (TCH) q21d / Trastuzumab q21d. Switched to Herceptin alone in 12/23  XRT completed in 1/24 (20 treatments)  Echo 5/23 EF 55-65% GLS -23.1% Echo 9/23 EF 60-65% no GLS due to poor tracking Echo 03/29/22 EF 60-65% GLS -21.6%  Echo 3/24 50-55% EF 50-55% read as anteroseptal and inferoseptal HK GLS -15.6% (likely underestimated) -> Herceptin held briefly  I saw her in March due to decreased EF on echo. I felt EF normal and hercpeptin restarted. Returns today for repeat echo  CAC 5/24: 987 -> I started crestor   Feels good but has had several episodes with shoulder pain going down her arm.  Completed Herceptin in 7/24. Denies edema, SOB, orthopnea or PND. SBP 110-130.   Echo today 08/18/22: EF 55-60% GLS -16.7% (underestimated due to poor endocardial tracking)   Family history positive for heart disease: M had MI at 80 y/o died in 45s F died at 22 from MI/HF (smoker) MGF died at 80 y/o with MI B possible MI in 30s died in 28s during stress test (morbidly obese) B + heart disease (obese smoker)   Past Medical History:  Diagnosis Date   Breast cancer (HCC)    Hypertension     Current Outpatient Medications  Medication Sig Dispense Refill   acetaminophen (TYLENOL) 325 MG tablet Take 325 mg by mouth every 6 (six) hours as needed for moderate pain.     anastrozole (ARIMIDEX) 1 MG tablet Take 1 mg by mouth daily.     aspirin EC 81 MG tablet Take 1 tablet (81 mg total) by mouth daily. Swallow whole. 90 tablet 3   benazepril (LOTENSIN) 20  MG tablet Take 20 mg by mouth daily.     cholecalciferol (VITAMIN D3) 25 MCG (1000 UNIT) tablet Take 1,000 Units by mouth daily.     famotidine (PEPCID) 20 MG tablet Take 20 mg by mouth daily as needed for heartburn or indigestion.     hydrochlorothiazide (MICROZIDE) 12.5 MG capsule Take 12.5 mg by mouth daily.     ibuprofen (ADVIL) 200 MG tablet Take 200 mg by mouth every 6 (six) hours as needed for moderate pain.     iron polysaccharides (NIFEREX) 150 MG capsule Take 150 mg by mouth 3 (three) times a week. M W F     lidocaine-prilocaine (EMLA) cream Apply to affected area once External for 30 Days     magnesium oxide (MAG-OX) 400 (240 Mg) MG tablet Take 400 mg by mouth daily.     rosuvastatin (CRESTOR) 20 MG tablet Take 1 tablet (20 mg total) by mouth daily. 90 tablet 3   Vitamins-Lipotropics (MULTI-VITAMIN HP/MINERALS PO) Take 1 tablet by mouth daily.     No current facility-administered medications for this encounter.    Allergies  Allergen Reactions   Macrodantin [Nitrofurantoin] Hives      Social History   Socioeconomic History   Marital status: Married    Spouse name: Not on file   Number of children: Not on file   Years of education: Not on file  Highest education level: Not on file  Occupational History   Not on file  Tobacco Use   Smoking status: Never   Smokeless tobacco: Not on file  Vaping Use   Vaping status: Never Used  Substance and Sexual Activity   Alcohol use: Yes    Comment: 3- 4 drinks   Drug use: Never   Sexual activity: Not Currently  Other Topics Concern   Not on file  Social History Narrative   Not on file   Social Determinants of Health   Financial Resource Strain: Low Risk  (09/09/2021)   Overall Financial Resource Strain (CARDIA)    Difficulty of Paying Living Expenses: Not hard at all  Food Insecurity: No Food Insecurity (09/09/2021)   Hunger Vital Sign    Worried About Running Out of Food in the Last Year: Never true    Ran Out of Food  in the Last Year: Never true  Transportation Needs: No Transportation Needs (09/09/2021)   PRAPARE - Administrator, Civil Service (Medical): No    Lack of Transportation (Non-Medical): No  Physical Activity: Not on file  Stress: Not on file  Social Connections: Not on file  Intimate Partner Violence: Not on file      Family History  Problem Relation Age of Onset   Heart disease Father 47   Thyroid cancer Other 14       niece's daughter    Vitals:   11/30/22 1153  BP: 124/70  Pulse: 81  SpO2: 99%  Weight: 63.9 kg (140 lb 12.8 oz)     PHYSICAL EXAM: General:  Well appearing. No resp difficulty HEENT: normal Neck: supple. no JVD. Carotids 2+ bilat; no bruits. No lymphadenopathy or thryomegaly appreciated. Cor: PMI nondisplaced. Regular rate & rhythm. No rubs, gallops or murmurs. Lungs: clear Abdomen: soft, nontender, nondistended. No hepatosplenomegaly. No bruits or masses. Good bowel sounds. Extremities: no cyanosis, clubbing, rash, edema Neuro: alert & orientedx3, cranial nerves grossly intact. moves all 4 extremities w/o difficulty. Affect pleasant   ASSESSMENT & PLAN:  1. Abnormal echo - Echo 5/23 EF 55-60% GLS -23.1% - Echo 9/23 EF 60-65% no GLS due to poor tracking - Echo 03/29/22 EF 55-60% GLS -21.6%  - Echo 3/24 50-55% EF 50-55% read as anteroseptal and inferoseptal HK GLS -15.6% (likely underestimated) - I have reviewed all echos. In looking at echo in 3/24, I think EF 55-60% and don't see any convincing WMAs. Also the timeline of change does not fit with Herceptin CM - At this point would continue Herceptin and repeat echo in 1 month - Given very strong FHx of CAD and question of abnormal echo, will proceed with CAC to further evaluate for obstructive CAD - Echo  08/18/22: EF 55-60% GLS -16.7% (underestimated due to poor endocardial tracking)  - Finished Herceptin in 7/24 - Echo today  11/30/22: EF 55-60% Personally reviewed  2. Breast Cancer -  plan as above - Has finished Herceptin in 7/24.   3. HTN - SBP ranges 120-130s at home - PCP following  4. Shoulder-> arm pain with high coronary artery calcium score - will plan cardiac CTA to further evaluate for obstructive CAD - continue crestor and ASA 81 - Will refer to Dr. Cristal Deer for further risk reduction strategies.  Arvilla Meres, MD  12:21 PM

## 2022-12-06 DIAGNOSIS — Z923 Personal history of irradiation: Secondary | ICD-10-CM | POA: Diagnosis not present

## 2022-12-06 DIAGNOSIS — Z853 Personal history of malignant neoplasm of breast: Secondary | ICD-10-CM | POA: Diagnosis not present

## 2022-12-07 ENCOUNTER — Encounter (HOSPITAL_COMMUNITY): Payer: Self-pay

## 2022-12-08 DIAGNOSIS — Z961 Presence of intraocular lens: Secondary | ICD-10-CM | POA: Diagnosis not present

## 2022-12-09 ENCOUNTER — Other Ambulatory Visit: Payer: Self-pay | Admitting: Hematology and Oncology

## 2022-12-09 ENCOUNTER — Ambulatory Visit (HOSPITAL_COMMUNITY)
Admission: RE | Admit: 2022-12-09 | Discharge: 2022-12-09 | Disposition: A | Discharge status: Home / Self Care | Payer: Medicare Other | Source: Ambulatory Visit | Attending: Internal Medicine | Admitting: Internal Medicine

## 2022-12-09 DIAGNOSIS — I2510 Atherosclerotic heart disease of native coronary artery without angina pectoris: Secondary | ICD-10-CM | POA: Diagnosis not present

## 2022-12-09 DIAGNOSIS — Z8249 Family history of ischemic heart disease and other diseases of the circulatory system: Secondary | ICD-10-CM

## 2022-12-09 DIAGNOSIS — R931 Abnormal findings on diagnostic imaging of heart and coronary circulation: Secondary | ICD-10-CM | POA: Diagnosis not present

## 2022-12-09 DIAGNOSIS — R9430 Abnormal result of cardiovascular function study, unspecified: Secondary | ICD-10-CM

## 2022-12-09 MED ORDER — NITROGLYCERIN 0.4 MG SL SUBL
0.8000 mg | SUBLINGUAL_TABLET | Freq: Once | SUBLINGUAL | Status: AC
  Administered 2022-12-09: 0.8 mg via SUBLINGUAL

## 2022-12-09 MED ORDER — IOHEXOL 350 MG/ML SOLN
100.0000 mL | Freq: Once | INTRAVENOUS | Status: AC | PRN
  Administered 2022-12-09: 100 mL via INTRAVENOUS

## 2022-12-09 MED ORDER — NITROGLYCERIN 0.4 MG SL SUBL
SUBLINGUAL_TABLET | SUBLINGUAL | Status: AC
  Filled 2022-12-09: qty 2

## 2022-12-10 ENCOUNTER — Ambulatory Visit (HOSPITAL_COMMUNITY): Admission: RE | Admit: 2022-12-10 | Payer: Medicare Other | Source: Ambulatory Visit

## 2022-12-10 ENCOUNTER — Other Ambulatory Visit: Payer: Self-pay | Admitting: Cardiology

## 2022-12-10 DIAGNOSIS — R310 Gross hematuria: Secondary | ICD-10-CM | POA: Diagnosis not present

## 2022-12-10 DIAGNOSIS — R8289 Other abnormal findings on cytological and histological examination of urine: Secondary | ICD-10-CM | POA: Diagnosis not present

## 2022-12-10 DIAGNOSIS — R931 Abnormal findings on diagnostic imaging of heart and coronary circulation: Secondary | ICD-10-CM | POA: Diagnosis not present

## 2022-12-10 DIAGNOSIS — R3916 Straining to void: Secondary | ICD-10-CM | POA: Diagnosis not present

## 2022-12-10 DIAGNOSIS — R3911 Hesitancy of micturition: Secondary | ICD-10-CM | POA: Diagnosis not present

## 2022-12-15 NOTE — H&P (Signed)
  Subjective Patient ID: SANDRAL STEINBRECHER is a 82 y.o. female.     HPI   Returns for follow up discussion prior to planned breast surgeyr. 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 Underwent right lumpectomy for Stage IA (pT2, pN0, cM0, G1, ER+, PR+, HER2+  3.6cm, margins +, 4 SLN negative. Prior prognostic panel: ER 99%, PR 10%, HER2    Completed adjuvant radiation 04/2022.  Completed Herceptin course. On anastrozole.   MMG 08/2022 benign   Prior to lumpectomy 36 C. Current wearing same bra but right does not fill out. Not using insert. Wt stable   Works part Government social research officer at [employer]. Lives with spouse.   Review of Systems  All other systems reviewed and are negative.     Objective Physical Exam  Cardiovascular: Normal rate, regular rhythm and normal heart sounds.    Pulmonary/Chest Effort normal and breath sounds normal.    Left chest port Breasts:  Right breast with Magtrace staining Right breast grade 1 ptosis left breast grade 2/3 ptosis Right breast<left breast volume SN to nipple R 25 L 28 cm BW R 17 L 19 cm Nipple to IMF R 10 L 10 cm Right lateral breast scar   Assessment/Plan History of breast cancer History of therapeutic radiation     Plan removal left chest port, right breast mastopexy, left breast reduction. Hold ASA week prior to surgery.   Counseled to offer most symmetry, recommend bilateral procedure with right breast mastopexy and left breast reduction. Reviewed anchor type scars, OP surgery, drain on left anticipated, post operative visits and limitations, recovery. Diminished sensation nipple and breast skin, risk of nipple loss, wound healing problems, asymmetry, incidental carcinoma, changes with wt gain/loss, aging, unacceptable cosmetic appearance reviewed. Reviewed increased risks over radiated breast including wound healing problems. Reviewed anticipate recurrent asymmetry breasts/nipple position with aging as radiated breast will not develop as much  ptosis as left breast.    Additional risks including but not limited to bleeding, seroma, hematoma, damage to adjacent structures, need for additional procedures, infection, blood clots in legs or lungs reviewed. Completed ASPS consent breast reduction.   Drain teaching completed. Rx for tramadol given.  Glenna Fellows, MD [facility] Plastic & Reconstructive Surgery  Office/ physician access line after hours [phone]

## 2022-12-16 ENCOUNTER — Encounter (HOSPITAL_BASED_OUTPATIENT_CLINIC_OR_DEPARTMENT_OTHER): Payer: Self-pay | Admitting: Plastic Surgery

## 2022-12-17 ENCOUNTER — Encounter (HOSPITAL_BASED_OUTPATIENT_CLINIC_OR_DEPARTMENT_OTHER)
Admission: RE | Admit: 2022-12-17 | Discharge: 2022-12-17 | Disposition: A | Discharge status: Home / Self Care | Payer: Medicare Other | Source: Ambulatory Visit | Attending: Plastic Surgery | Admitting: Plastic Surgery

## 2022-12-17 DIAGNOSIS — Z01812 Encounter for preprocedural laboratory examination: Secondary | ICD-10-CM | POA: Diagnosis not present

## 2022-12-17 LAB — BASIC METABOLIC PANEL
Anion gap: 9 (ref 5–15)
BUN: 25 mg/dL — ABNORMAL HIGH (ref 8–23)
CO2: 28 mmol/L (ref 22–32)
Calcium: 9.7 mg/dL (ref 8.9–10.3)
Chloride: 103 mmol/L (ref 98–111)
Creatinine, Ser: 0.93 mg/dL (ref 0.44–1.00)
GFR, Estimated: >60 mL/min (ref >60)
Glucose, Bld: 96 mg/dL (ref 70–99)
Potassium: 4.5 mmol/L (ref 3.5–5.1)
Sodium: 140 mmol/L (ref 135–145)

## 2022-12-17 MED ORDER — CHLORHEXIDINE GLUCONATE CLOTH 2 % EX PADS: 6.0000 | MEDICATED_PAD | Freq: Once | CUTANEOUS | Status: DC

## 2022-12-17 NOTE — Progress Notes (Signed)

## 2022-12-24 ENCOUNTER — Encounter (HOSPITAL_BASED_OUTPATIENT_CLINIC_OR_DEPARTMENT_OTHER): Payer: Self-pay | Admitting: Plastic Surgery

## 2022-12-24 ENCOUNTER — Ambulatory Visit (HOSPITAL_BASED_OUTPATIENT_CLINIC_OR_DEPARTMENT_OTHER): Payer: Medicare Other | Admitting: Anesthesiology

## 2022-12-24 ENCOUNTER — Ambulatory Visit (HOSPITAL_BASED_OUTPATIENT_CLINIC_OR_DEPARTMENT_OTHER)
Admission: RE | Admit: 2022-12-24 | Discharge: 2022-12-24 | Disposition: A | Discharge status: Home / Self Care | Payer: Medicare Other | Attending: Plastic Surgery | Admitting: Plastic Surgery

## 2022-12-24 ENCOUNTER — Other Ambulatory Visit: Payer: Self-pay

## 2022-12-24 ENCOUNTER — Encounter (HOSPITAL_BASED_OUTPATIENT_CLINIC_OR_DEPARTMENT_OTHER)
Admission: RE | Disposition: A | Discharge status: Home / Self Care | Payer: Self-pay | Source: Home / Self Care | Attending: Plastic Surgery

## 2022-12-24 DIAGNOSIS — Z853 Personal history of malignant neoplasm of breast: Secondary | ICD-10-CM | POA: Insufficient documentation

## 2022-12-24 DIAGNOSIS — I10 Essential (primary) hypertension: Secondary | ICD-10-CM | POA: Insufficient documentation

## 2022-12-24 DIAGNOSIS — Z01818 Encounter for other preprocedural examination: Secondary | ICD-10-CM

## 2022-12-24 DIAGNOSIS — N6489 Other specified disorders of breast: Secondary | ICD-10-CM

## 2022-12-24 DIAGNOSIS — Z79811 Long term (current) use of aromatase inhibitors: Secondary | ICD-10-CM | POA: Diagnosis not present

## 2022-12-24 DIAGNOSIS — Z452 Encounter for adjustment and management of vascular access device: Secondary | ICD-10-CM | POA: Insufficient documentation

## 2022-12-24 DIAGNOSIS — N651 Disproportion of reconstructed breast: Secondary | ICD-10-CM | POA: Diagnosis not present

## 2022-12-24 DIAGNOSIS — Z923 Personal history of irradiation: Secondary | ICD-10-CM | POA: Insufficient documentation

## 2022-12-24 DIAGNOSIS — N6032 Fibrosclerosis of left breast: Secondary | ICD-10-CM | POA: Diagnosis not present

## 2022-12-24 DIAGNOSIS — N6031 Fibrosclerosis of right breast: Secondary | ICD-10-CM | POA: Diagnosis not present

## 2022-12-24 DIAGNOSIS — N62 Hypertrophy of breast: Secondary | ICD-10-CM | POA: Diagnosis not present

## 2022-12-24 DIAGNOSIS — Z483 Aftercare following surgery for neoplasm: Secondary | ICD-10-CM | POA: Diagnosis not present

## 2022-12-24 HISTORY — PX: BREAST REDUCTION SURGERY: SHX8

## 2022-12-24 HISTORY — PX: MASTOPEXY: SHX5358

## 2022-12-24 HISTORY — PX: PORT-A-CATH REMOVAL: SHX5289

## 2022-12-24 SURGERY — MASTOPEXY
Anesthesia: General | Site: Chest | Laterality: Right

## 2022-12-24 MED ORDER — FENTANYL CITRATE (PF) 100 MCG/2ML IJ SOLN
INTRAMUSCULAR | Status: DC | PRN
  Administered 2022-12-24 (×2): 25 ug via INTRAVENOUS
  Administered 2022-12-24 (×2): 50 ug via INTRAVENOUS

## 2022-12-24 MED ORDER — GABAPENTIN 300 MG PO CAPS
ORAL_CAPSULE | ORAL | Status: AC
  Filled 2022-12-24: qty 1

## 2022-12-24 MED ORDER — FENTANYL CITRATE (PF) 100 MCG/2ML IJ SOLN: 25.0000 ug | INTRAMUSCULAR | Status: DC | PRN

## 2022-12-24 MED ORDER — LIDOCAINE 2% (20 MG/ML) 5 ML SYRINGE
INTRAMUSCULAR | Status: AC
  Filled 2022-12-24: qty 5

## 2022-12-24 MED ORDER — ONDANSETRON HCL 4 MG/2ML IJ SOLN
INTRAMUSCULAR | Status: DC | PRN
  Administered 2022-12-24: 4 mg via INTRAVENOUS

## 2022-12-24 MED ORDER — DEXAMETHASONE SODIUM PHOSPHATE 10 MG/ML IJ SOLN
INTRAMUSCULAR | Status: DC | PRN
Start: 2022-12-24 — End: 2022-12-24
  Administered 2022-12-24: 10 mg via INTRAVENOUS

## 2022-12-24 MED ORDER — DEXAMETHASONE SODIUM PHOSPHATE 10 MG/ML IJ SOLN
INTRAMUSCULAR | Status: AC
  Filled 2022-12-24: qty 1

## 2022-12-24 MED ORDER — ACETAMINOPHEN 500 MG PO TABS
1000.0000 mg | ORAL_TABLET | ORAL | Status: AC
  Administered 2022-12-24: 1000 mg via ORAL

## 2022-12-24 MED ORDER — LIDOCAINE HCL (CARDIAC) PF 100 MG/5ML IV SOSY
PREFILLED_SYRINGE | INTRAVENOUS | Status: DC | PRN
  Administered 2022-12-24: 100 mg via INTRAVENOUS

## 2022-12-24 MED ORDER — CEFAZOLIN SODIUM-DEXTROSE 2-4 GM/100ML-% IV SOLN
INTRAVENOUS | Status: AC
  Filled 2022-12-24: qty 100

## 2022-12-24 MED ORDER — OXYCODONE HCL 5 MG/5ML PO SOLN: 5.0000 mg | Freq: Once | ORAL | Status: DC | PRN

## 2022-12-24 MED ORDER — CEFAZOLIN SODIUM-DEXTROSE 2-4 GM/100ML-% IV SOLN
2.0000 g | INTRAVENOUS | Status: AC
  Administered 2022-12-24: 2 g via INTRAVENOUS

## 2022-12-24 MED ORDER — OXYCODONE HCL 5 MG PO TABS: 5.0000 mg | ORAL_TABLET | Freq: Once | ORAL | Status: DC | PRN

## 2022-12-24 MED ORDER — PROPOFOL 10 MG/ML IV BOLUS
INTRAVENOUS | Status: DC | PRN
  Administered 2022-12-24: 120 mg via INTRAVENOUS
  Administered 2022-12-24: 20 mg via INTRAVENOUS

## 2022-12-24 MED ORDER — LACTATED RINGERS IV SOLN: INTRAVENOUS | Status: DC

## 2022-12-24 MED ORDER — ROCURONIUM BROMIDE 10 MG/ML (PF) SYRINGE
PREFILLED_SYRINGE | INTRAVENOUS | Status: AC
  Filled 2022-12-24: qty 10

## 2022-12-24 MED ORDER — ROCURONIUM BROMIDE 100 MG/10ML IV SOLN
INTRAVENOUS | Status: DC | PRN
  Administered 2022-12-24: 20 mg via INTRAVENOUS
  Administered 2022-12-24: 50 mg via INTRAVENOUS

## 2022-12-24 MED ORDER — GABAPENTIN 300 MG PO CAPS
300.0000 mg | ORAL_CAPSULE | ORAL | Status: AC
  Administered 2022-12-24: 300 mg via ORAL

## 2022-12-24 MED ORDER — SUGAMMADEX SODIUM 200 MG/2ML IV SOLN
INTRAVENOUS | Status: DC | PRN
  Administered 2022-12-24: 150 mg via INTRAVENOUS

## 2022-12-24 MED ORDER — BUPIVACAINE HCL (PF) 0.5 % IJ SOLN
INTRAMUSCULAR | Status: DC | PRN
  Administered 2022-12-24: 30 mL

## 2022-12-24 MED ORDER — ONDANSETRON HCL 4 MG/2ML IJ SOLN: 4.0000 mg | Freq: Once | INTRAMUSCULAR | Status: DC | PRN

## 2022-12-24 MED ORDER — 0.9 % SODIUM CHLORIDE (POUR BTL) OPTIME
TOPICAL | Status: DC | PRN
  Administered 2022-12-24: 400 mL

## 2022-12-24 MED ORDER — DROPERIDOL 2.5 MG/ML IJ SOLN
INTRAMUSCULAR | Status: DC | PRN
Start: 2022-12-24 — End: 2022-12-24
  Administered 2022-12-24: .625 mg via INTRAVENOUS

## 2022-12-24 MED ORDER — FENTANYL CITRATE (PF) 100 MCG/2ML IJ SOLN
INTRAMUSCULAR | Status: AC
  Filled 2022-12-24: qty 2

## 2022-12-24 MED ORDER — PROPOFOL 10 MG/ML IV BOLUS
INTRAVENOUS | Status: AC
  Filled 2022-12-24: qty 20

## 2022-12-24 MED ORDER — ACETAMINOPHEN 500 MG PO TABS
ORAL_TABLET | ORAL | Status: AC
  Filled 2022-12-24: qty 2

## 2022-12-24 MED ORDER — EPHEDRINE 5 MG/ML INJ
INTRAVENOUS | Status: AC
  Filled 2022-12-24: qty 5

## 2022-12-24 MED ORDER — ONDANSETRON HCL 4 MG/2ML IJ SOLN
INTRAMUSCULAR | Status: AC
  Filled 2022-12-24: qty 2

## 2022-12-24 MED ORDER — EPHEDRINE SULFATE (PRESSORS) 50 MG/ML IJ SOLN
INTRAMUSCULAR | Status: DC | PRN
  Administered 2022-12-24 (×2): 5 mg via INTRAVENOUS

## 2022-12-24 SURGICAL SUPPLY — 56 items
ADH SKN CLS APL DERMABOND .7 (GAUZE/BANDAGES/DRESSINGS) ×6
APL PRP STRL LF DISP 70% ISPRP (MISCELLANEOUS) ×6
BINDER BREAST LRG (GAUZE/BANDAGES/DRESSINGS) IMPLANT
BLADE SURG 10 STRL SS (BLADE) ×12 IMPLANT
BLADE SURG 15 STRL LF DISP TIS (BLADE) ×3 IMPLANT
BLADE SURG 15 STRL SS (BLADE) ×3
BNDG GAUZE DERMACEA FLUFF 4 (GAUZE/BANDAGES/DRESSINGS) ×6 IMPLANT
BNDG GZE DERMACEA 4 6PLY (GAUZE/BANDAGES/DRESSINGS) ×6
CANISTER SUCT 1200ML W/VALVE (MISCELLANEOUS) ×3 IMPLANT
CHLORAPREP W/TINT 26 (MISCELLANEOUS) ×6 IMPLANT
COVER BACK TABLE 60X90IN (DRAPES) ×3 IMPLANT
COVER MAYO STAND STRL (DRAPES) ×3 IMPLANT
DERMABOND ADVANCED .7 DNX12 (GAUZE/BANDAGES/DRESSINGS) ×6 IMPLANT
DRAIN CHANNEL 15F RND FF W/TCR (WOUND CARE) IMPLANT
DRAPE TOP ARMCOVERS (MISCELLANEOUS) ×3 IMPLANT
DRAPE U-SHAPE 76X120 STRL (DRAPES) ×3 IMPLANT
DRAPE UTILITY XL STRL (DRAPES) ×3 IMPLANT
ELECT BLADE 4.0 EZ CLEAN MEGAD (MISCELLANEOUS)
ELECT COATED BLADE 2.86 ST (ELECTRODE) ×3 IMPLANT
ELECT REM PT RETURN 9FT ADLT (ELECTROSURGICAL) ×3
ELECTRODE BLDE 4.0 EZ CLN MEGD (MISCELLANEOUS) IMPLANT
ELECTRODE REM PT RTRN 9FT ADLT (ELECTROSURGICAL) ×3 IMPLANT
EVACUATOR SILICONE 100CC (DRAIN) IMPLANT
GAUZE PAD ABD 8X10 STRL (GAUZE/BANDAGES/DRESSINGS) ×6 IMPLANT
GLOVE BIO SURGEON STRL SZ 6 (GLOVE) ×6 IMPLANT
GLOVE BIO SURGEON STRL SZ8 (GLOVE) IMPLANT
GLOVE BIOGEL PI IND STRL 7.0 (GLOVE) IMPLANT
GLOVE BIOGEL PI IND STRL 8 (GLOVE) IMPLANT
GOWN STRL REUS W/ TWL LRG LVL3 (GOWN DISPOSABLE) ×6 IMPLANT
GOWN STRL REUS W/ TWL XL LVL3 (GOWN DISPOSABLE) IMPLANT
GOWN STRL REUS W/TWL LRG LVL3 (GOWN DISPOSABLE) ×6
GOWN STRL REUS W/TWL XL LVL3 (GOWN DISPOSABLE) ×3
NDL HYPO 25X1 1.5 SAFETY (NEEDLE) ×3 IMPLANT
NEEDLE HYPO 25X1 1.5 SAFETY (NEEDLE) ×3
NS IRRIG 1000ML POUR BTL (IV SOLUTION) ×3 IMPLANT
PACK BASIN DAY SURGERY FS (CUSTOM PROCEDURE TRAY) ×3 IMPLANT
PENCIL SMOKE EVACUATOR (MISCELLANEOUS) ×3 IMPLANT
PIN SAFETY STERILE (MISCELLANEOUS) ×3 IMPLANT
SHEET MEDIUM DRAPE 40X70 STRL (DRAPES) ×6 IMPLANT
SLEEVE SCD COMPRESS KNEE MED (STOCKING) ×3 IMPLANT
SPONGE T-LAP 18X18 ~~LOC~~+RFID (SPONGE) ×9 IMPLANT
STAPLER VISISTAT 35W (STAPLE) ×3 IMPLANT
SUT ETHILON 2 0 FS 18 (SUTURE) IMPLANT
SUT MNCRL AB 4-0 PS2 18 (SUTURE) ×3 IMPLANT
SUT PDS AB 2-0 CT2 27 (SUTURE) IMPLANT
SUT VIC AB 3-0 PS1 18 (SUTURE) ×15
SUT VIC AB 3-0 PS1 18XBRD (SUTURE) ×3 IMPLANT
SUT VIC AB 3-0 SH 27 (SUTURE) ×3
SUT VIC AB 3-0 SH 27X BRD (SUTURE) IMPLANT
SUT VIC AB 4-0 PS2 18 (SUTURE) ×3 IMPLANT
SYR BULB IRRIG 60ML STRL (SYRINGE) ×3 IMPLANT
SYR CONTROL 10ML LL (SYRINGE) ×3 IMPLANT
TOWEL GREEN STERILE FF (TOWEL DISPOSABLE) ×6 IMPLANT
TUBE CONNECTING 20X1/4 (TUBING) ×3 IMPLANT
UNDERPAD 30X36 HEAVY ABSORB (UNDERPADS AND DIAPERS) ×6 IMPLANT
YANKAUER SUCT BULB TIP NO VENT (SUCTIONS) ×3 IMPLANT

## 2022-12-24 NOTE — Transfer of Care (Signed)
Immediate Anesthesia Transfer of Care Note  Patient: Rhonda Richardson  Procedure(s) Performed: MASTOPEXY (Right: Breast) MAMMARY REDUCTION  (BREAST) (Left: Breast) REMOVAL CHEST PORT (Left: Chest)  Patient Location: PACU  Anesthesia Type:General  Level of Consciousness: drowsy and patient cooperative  Airway & Oxygen Therapy: Patient Spontanous Breathing and Patient connected to face mask oxygen  Post-op Assessment: Report given to RN and Post -op Vital signs reviewed and stable  Post vital signs: Reviewed and stable  Last Vitals:  Vitals Value Taken Time  BP    Temp    Pulse 92 12/24/22 1353  Resp    SpO2 95 % 12/24/22 1353  Vitals shown include unfiled device data.  Last Pain:  Vitals:   12/24/22 1004  TempSrc: Temporal  PainSc: 0-No pain         Complications: No notable events documented.

## 2022-12-24 NOTE — Op Note (Signed)
Operative Note   DATE OF OPERATION: 9.13.2024  LOCATION: [facility]-outpatient  SURGICAL DIVISION: Plastic Surgery  PREOPERATIVE DIAGNOSES:  1. History right breast cancer 2. History therapeutic radiation 3. Post operative asymmetry breasts  POSTOPERATIVE DIAGNOSES:  same  PROCEDURE:  1. Removal left chest port 2. Right breast mastopexy 3. Left breast reduction  SURGEON: Glenna Fellows MD MBA  ASSISTANT: none  ANESTHESIA:  General.   EBL: 50 ml  COMPLICATIONS: None immediate.   INDICATIONS FOR PROCEDURE:  The patient, Rhonda Richardson, is a 82 y.o. female born on September 11, 1940, is here for breast reconstruction following right lumpectomy and adjuvant radiation, immunotherapy.    FINDINGS: Right mastopexy 42 g Left breast reduction 209 g  DESCRIPTION OF PROCEDURE:  The patient was marked standing in the preoperative area to mark sternal notch, chest midline, anterior axillary lines, inframammary folds. The location of new nipple areolar complex was marked at 20 cm from sternal notch. This was marked symmetric over bilateral breasts. With aid of Wise pattern marker, location of new nipple areolar complex and vertical limbs (7 cm) were marked by displacement of breasts along meridian. The patient was taken to the operating room. SCDs were placed and IV antibiotics were given. The patient's operative site was prepped and draped in a sterile fashion. A time out was performed and all information was confirmed to be correct.     Incision made in left chest scar over port. Incision carried through superficial fascia and capsule surrounding port. Prolene sutures removed and port removed intact. Closure completed with 3-0 vicryl in superficial fascia, 3-0 vicryl in dermis and 4-0 monocryl subcuticular skin closure.  I then directed attention to right breast where superior pedicle designed. NAC incised at border areola margin. The pedicle was deepithelialized. Pedicle developed to chest  wall. Breast tissue resected over lower pole. Medial and lateral flaps developed. Breast tailor tacked closed.  Over left breast, superior medial pedicle marked and nipple areolar complex incised with 42 mm diameter marker at border areola margin. Pedicle deepithlialized and developed to chest wall. Breast tissue resected over lower pole. Medial and lateral flaps developed. Additional superior breast tissue excised. Breast tailor tacked closed.    Patient brought to upright sitting position and assessed for symmetry. Patient returned to supine position. Breast cavities irrigated and hemostasis obtained. Local anesthetic infiltrated throughout each breast. 15 Fr JP placed in each breast and secured with 2-0 nylon. Closure completed bilateral with 3-0 vicryl to approximate dermis along inframammary fold and vertical limb. NAC inset with 3-0 vicryl in dermis. Skin closure completed with 4-0 monocryl subcuticular throughout. Tissue adhesive applied. Dry dressing and breast binder applied.   The patient was allowed to wake from anesthesia, extubated and taken to the recovery room in satisfactory condition.   SPECIMENS: right and left breast tissue  DRAINS: 15 Fr JP in right and left breast  Glenna Fellows, MD [facility] Plastic & Reconstructive Surgery  Office/ physician access line after hours [phone]

## 2022-12-24 NOTE — Interval H&P Note (Signed)
History and Physical Interval Note:  12/24/2022 10:23 AM  Rhonda Richardson  has presented today for surgery, with the diagnosis of History breast cancer history therapeutic radiation post operative asymmetry breasts.  The various methods of treatment have been discussed with the patient and family. After consideration of risks, benefits and other options for treatment, the patient has consented to  Procedure(s): MASTOPEXY (Right) MAMMARY REDUCTION  (BREAST) (Left) REMOVAL CHEST PORT (Left) as a surgical intervention.  The patient's history has been reviewed, patient examined, no change in status, stable for surgery.  I have reviewed the patient's chart and labs.  Questions were answered to the patient's satisfaction.     Irean Hong Thierno Hun

## 2022-12-24 NOTE — Discharge Instructions (Addendum)
Post Anesthesia Home Care Instructions  Activity: Get plenty of rest for the remainder of the day. A responsible individual must stay with you for 24 hours following the procedure.  For the next 24 hours, DO NOT: -Drive a car -Advertising copywriter -Drink alcoholic beverages -Take any medication unless instructed by your physician -Make any legal decisions or sign important papers.  Meals: Start with liquid foods such as gelatin or soup. Progress to regular foods as tolerated. Avoid greasy, spicy, heavy foods. If nausea and/or vomiting occur, drink only clear liquids until the nausea and/or vomiting subsides. Call your physician if vomiting continues.  Special Instructions/Symptoms: Your throat may feel dry or sore from the anesthesia or the breathing tube placed in your throat during surgery. If this causes discomfort, gargle with warm salt water. The discomfort should disappear within 24 hours.  If you had a scopolamine patch placed behind your ear for the management of post- operative nausea and/or vomiting:  1. The medication in the patch is effective for 72 hours, after which it should be removed.  Wrap patch in a tissue and discard in the trash. Wash hands thoroughly with soap and water. 2. You may remove the patch earlier than 72 hours if you experience unpleasant side effects which may include dry mouth, dizziness or visual disturbances. 3. Avoid touching the patch. Wash your hands with soap and water after contact with the patch.     About my Jackson-Pratt Bulb Drain  What is a Jackson-Pratt bulb? A Jackson-Pratt is a soft, round device used to collect drainage. It is connected to a long, thin drainage catheter, which is held in place by one or two small stiches near your surgical incision site. When the bulb is squeezed, it forms a vacuum, forcing the drainage to empty into the bulb.  Emptying the Jackson-Pratt bulb- To empty the bulb: 1. Release the plug on the top of the  bulb. 2. Pour the bulb's contents into a measuring container which your nurse will provide. 3. Record the time emptied and amount of drainage. Empty the drain(s) as often as your     doctor or nurse recommends.  Date                  Time                    Amount (Drain 1)                 Amount (Drain 2)  _____________________________________________________________________  _____________________________________________________________________  _____________________________________________________________________  _____________________________________________________________________  _____________________________________________________________________  _____________________________________________________________________  _____________________________________________________________________  _____________________________________________________________________  Squeezing the Jackson-Pratt Bulb- To squeeze the bulb: 1. Make sure the plug at the top of the bulb is open. 2. Squeeze the bulb tightly in your fist. You will hear air squeezing from the bulb. 3. Replace the plug while the bulb is squeezed. 4. Use a safety pin to attach the bulb to your clothing. This will keep the catheter from     pulling at the bulb insertion site.  When to call your doctor- Call your doctor if: Drain site becomes red, swollen or hot. You have a fever greater than 101 degrees F. There is oozing at the drain site. Drain falls out (apply a guaze bandage over the drain hole and secure it with tape). Drainage increases daily not related to activity patterns. (You will usually have more drainage when you are active than when you are resting.) Drainage has a bad odor.   May  have Tylenol today after 4:06 PM

## 2022-12-24 NOTE — Anesthesia Postprocedure Evaluation (Signed)
Anesthesia Post Note  Patient: Rhonda Richardson  Procedure(s) Performed: MASTOPEXY (Right: Breast) MAMMARY REDUCTION  (BREAST) (Left: Breast) REMOVAL CHEST PORT (Left: Chest)     Patient location during evaluation: PACU Anesthesia Type: General Level of consciousness: awake and alert Pain management: pain level controlled Vital Signs Assessment: post-procedure vital signs reviewed and stable Respiratory status: spontaneous breathing, nonlabored ventilation, respiratory function stable and patient connected to nasal cannula oxygen Cardiovascular status: blood pressure returned to baseline and stable Postop Assessment: no apparent nausea or vomiting Anesthetic complications: no  No notable events documented.  Last Vitals:  Vitals:   12/24/22 1400 12/24/22 1415  BP: (!) 142/72 (!) 144/78  Pulse: 84 82  Resp: 12 16  Temp:    SpO2: 99% 98%    Last Pain:  Vitals:   12/24/22 1353  TempSrc:   PainSc: 2                  Sirius Woodford S

## 2022-12-24 NOTE — Anesthesia Procedure Notes (Signed)
Procedure Name: Intubation Date/Time: 12/24/2022 11:09 AM  Performed by: Thornell Mule, CRNAPre-anesthesia Checklist: Patient identified, Emergency Drugs available, Suction available and Patient being monitored Patient Re-evaluated:Patient Re-evaluated prior to induction Oxygen Delivery Method: Circle system utilized Preoxygenation: Pre-oxygenation with 100% oxygen Induction Type: IV induction Ventilation: Mask ventilation without difficulty Laryngoscope Size: Miller and 3 Grade View: Grade I Tube type: Oral Tube size: 7.0 mm Number of attempts: 1 Airway Equipment and Method: Stylet and Oral airway Placement Confirmation: ETT inserted through vocal cords under direct vision, positive ETCO2 and breath sounds checked- equal and bilateral Secured at: 20 cm Tube secured with: Tape Dental Injury: Teeth and Oropharynx as per pre-operative assessment

## 2022-12-24 NOTE — Anesthesia Preprocedure Evaluation (Addendum)
Anesthesia Evaluation  Patient identified by MRN, date of birth, ID band Patient awake    Reviewed: Allergy & Precautions, H&P , NPO status , Patient's Chart, lab work & pertinent test results  Airway Mallampati: II  TM Distance: >3 FB Neck ROM: Full    Dental no notable dental hx.    Pulmonary neg pulmonary ROS   Pulmonary exam normal breath sounds clear to auscultation       Cardiovascular hypertension, Pt. on medications Normal cardiovascular exam Rhythm:Regular Rate:Normal     Neuro/Psych negative neurological ROS  negative psych ROS   GI/Hepatic Neg liver ROS,GERD  Medicated,,  Endo/Other  negative endocrine ROS    Renal/GU negative Renal ROS  negative genitourinary   Musculoskeletal negative musculoskeletal ROS (+)    Abdominal   Peds negative pediatric ROS (+)  Hematology negative hematology ROS (+)   Anesthesia Other Findings   Reproductive/Obstetrics negative OB ROS                             Anesthesia Physical Anesthesia Plan  ASA: 2  Anesthesia Plan: General   Post-op Pain Management: Tylenol PO (pre-op)* and Gabapentin PO (pre-op)*   Induction: Intravenous  PONV Risk Score and Plan: 3 and Ondansetron, Dexamethasone, Droperidol and Treatment may vary due to age or medical condition  Airway Management Planned: Oral ETT  Additional Equipment:   Intra-op Plan:   Post-operative Plan: Extubation in OR  Informed Consent: I have reviewed the patients History and Physical, chart, labs and discussed the procedure including the risks, benefits and alternatives for the proposed anesthesia with the patient or authorized representative who has indicated his/her understanding and acceptance.     Dental advisory given  Plan Discussed with: CRNA and Surgeon  Anesthesia Plan Comments:        Anesthesia Quick Evaluation

## 2022-12-27 ENCOUNTER — Encounter (HOSPITAL_BASED_OUTPATIENT_CLINIC_OR_DEPARTMENT_OTHER): Payer: Self-pay | Admitting: Plastic Surgery

## 2022-12-27 LAB — SURGICAL PATHOLOGY

## 2022-12-30 DIAGNOSIS — Z9889 Other specified postprocedural states: Secondary | ICD-10-CM | POA: Diagnosis not present

## 2022-12-30 DIAGNOSIS — Z923 Personal history of irradiation: Secondary | ICD-10-CM | POA: Diagnosis not present

## 2022-12-30 DIAGNOSIS — Z853 Personal history of malignant neoplasm of breast: Secondary | ICD-10-CM | POA: Diagnosis not present

## 2023-01-03 ENCOUNTER — Ambulatory Visit: Payer: Medicare Other | Attending: General Surgery

## 2023-01-03 VITALS — Wt 142.4 lb

## 2023-01-03 DIAGNOSIS — Z483 Aftercare following surgery for neoplasm: Secondary | ICD-10-CM | POA: Insufficient documentation

## 2023-01-03 NOTE — Therapy (Signed)
OUTPATIENT PHYSICAL THERAPY SOZO SCREENING NOTE   Patient Name: Rhonda Richardson MRN: [id number] DOB:1940-06-20, 82 y.o., female Today's Date: 01/03/2023  PCP: Irven Coe, MD REFERRING PROVIDER: Almond Lint, MD   PT End of Session - 01/03/23 1523     Visit Number 4   # unchanged due to screen only   PT Start Time 1521    PT Stop Time 1525    PT Time Calculation (min) 4 min    Activity Tolerance Patient tolerated treatment well    Behavior During Therapy WFL for tasks assessed/performed             Past Medical History:  Diagnosis Date   Breast cancer (HCC)    Hypertension    Past Surgical History:  Procedure Laterality Date   BREAST LUMPECTOMY WITH RADIOACTIVE SEED AND SENTINEL LYMPH NODE BIOPSY Right 09/15/2021   Procedure: RIGHT BREAST SEED BRACKETED LUMPECTOMY AND SENTINEL NODE BIOPSY;  Surgeon: Almond Lint, MD;  Location: [facility];  Service: General;  Laterality: Right;   BREAST REDUCTION SURGERY Left 12/24/2022   Procedure: MAMMARY REDUCTION  (BREAST);  Surgeon: Glenna Fellows, MD;  Location: [facility];  Service: Plastics;  Laterality: Left;   cataract surgery Bilateral    DILATION AND CURETTAGE OF UTERUS  1970   EXCISION OF BREAST LESION Right 10/15/2021   Procedure: ASPIRATION RIGHT AXILLARY SEROMA;  Surgeon: Almond Lint, MD;  Location: [facility] OR;  Service: General;  Laterality: Right;   eyelid tendon repair Bilateral    MASTOPEXY Right 12/24/2022   Procedure: MASTOPEXY;  Surgeon: Glenna Fellows, MD;  Location: [facility];  Service: Plastics;  Laterality: Right;   PORT-A-CATH REMOVAL Left 12/24/2022   Procedure: REMOVAL CHEST PORT;  Surgeon: Glenna Fellows, MD;  Location: [facility];  Service: Plastics;  Laterality: Left;   PORTACATH PLACEMENT N/A 09/15/2021   Procedure: PORT PLACEMENT;  Surgeon: Almond Lint, MD;  Location:  SURGERY CENTER;  Service: General;  Laterality: N/A;    RE-EXCISION OF BREAST LUMPECTOMY Right 10/15/2021   Procedure: RE-EXCISION LUMPECTOMY RIGHT BREAST;  Surgeon: Almond Lint, MD;  Location: [facility] OR;  Service: General;  Laterality: Right;   Patient Active Problem List   Diagnosis Date Noted   Family history of ischemic heart disease (IHD) 08/30/2022   History of dysplastic nevus 08/30/2022   Port-A-Cath in place 11/18/2021   Genetic testing 09/28/2021   Essential hypertension 09/09/2021   Malignant neoplasm of upper-outer quadrant of right breast in female, estrogen receptor positive (HCC) 09/08/2021   Gastroesophageal reflux disease 04/08/2016   Sensorineural hearing loss (SNHL), 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 She had a right lumpectomy and sentinel node biopsy (0/4 nodes positive) on 09/15/2021 followed by a re-excision on 10/15/2021. It is triple positive with a Ki67 of 30%   PRECAUTIONS: right UE Lymphedema risk, None  SUBJECTIVE: Pt returns for her 3 month L-Dex screen.   PAIN:  Are you having pain? No  SOZO SCREENING: Patient was assessed today using the SOZO machine to determine the lymphedema index score. This was compared to her baseline score. It was determined that she is within the recommended range when compared to her baseline and no further action is needed at this time. She will continue SOZO screenings. These are done every 3  months for 2 years post operatively followed by every 6 months for 2 years, and then annually.   L-DEX FLOWSHEETS - 01/03/23 1500       L-DEX LYMPHEDEMA SCREENING   Measurement Type Unilateral    L-DEX MEASUREMENT EXTREMITY Upper Extremity    POSITION  Standing    DOMINANT SIDE Right    At Risk Side Right    BASELINE SCORE (UNILATERAL) -1.9    L-DEX SCORE (UNILATERAL) -1.5     VALUE CHANGE (UNILAT) 0.4               Hermenia Bers, PTA 01/03/2023, 3:33 PM

## 2023-01-20 DIAGNOSIS — Z9889 Other specified postprocedural states: Secondary | ICD-10-CM | POA: Diagnosis not present

## 2023-01-20 DIAGNOSIS — Z853 Personal history of malignant neoplasm of breast: Secondary | ICD-10-CM | POA: Diagnosis not present

## 2023-01-20 DIAGNOSIS — Z923 Personal history of irradiation: Secondary | ICD-10-CM | POA: Diagnosis not present

## 2023-02-27 ENCOUNTER — Encounter (HOSPITAL_BASED_OUTPATIENT_CLINIC_OR_DEPARTMENT_OTHER): Payer: Self-pay | Admitting: Cardiology

## 2023-03-01 DIAGNOSIS — E559 Vitamin D deficiency, unspecified: Secondary | ICD-10-CM | POA: Diagnosis not present

## 2023-03-01 DIAGNOSIS — E7800 Pure hypercholesterolemia, unspecified: Secondary | ICD-10-CM | POA: Diagnosis not present

## 2023-03-01 DIAGNOSIS — I10 Essential (primary) hypertension: Secondary | ICD-10-CM | POA: Diagnosis not present

## 2023-03-02 DIAGNOSIS — R319 Hematuria, unspecified: Secondary | ICD-10-CM | POA: Diagnosis not present

## 2023-03-02 DIAGNOSIS — R310 Gross hematuria: Secondary | ICD-10-CM | POA: Diagnosis not present

## 2023-03-02 DIAGNOSIS — M810 Age-related osteoporosis without current pathological fracture: Secondary | ICD-10-CM | POA: Diagnosis not present

## 2023-03-02 DIAGNOSIS — K219 Gastro-esophageal reflux disease without esophagitis: Secondary | ICD-10-CM | POA: Diagnosis not present

## 2023-03-02 DIAGNOSIS — E559 Vitamin D deficiency, unspecified: Secondary | ICD-10-CM | POA: Diagnosis not present

## 2023-03-02 DIAGNOSIS — C50911 Malignant neoplasm of unspecified site of right female breast: Secondary | ICD-10-CM | POA: Diagnosis not present

## 2023-03-02 DIAGNOSIS — N8110 Cystocele, unspecified: Secondary | ICD-10-CM | POA: Diagnosis not present

## 2023-03-02 DIAGNOSIS — Z0000 Encounter for general adult medical examination without abnormal findings: Secondary | ICD-10-CM | POA: Diagnosis not present

## 2023-03-02 DIAGNOSIS — D414 Neoplasm of uncertain behavior of bladder: Secondary | ICD-10-CM | POA: Diagnosis not present

## 2023-03-02 DIAGNOSIS — I10 Essential (primary) hypertension: Secondary | ICD-10-CM | POA: Diagnosis not present

## 2023-03-02 DIAGNOSIS — E7800 Pure hypercholesterolemia, unspecified: Secondary | ICD-10-CM | POA: Diagnosis not present

## 2023-03-02 NOTE — Progress Notes (Signed)
Cardiology Office Note:  .   Date:  03/03/2023  ID:  Rhonda Richardson, DOB 11-Dec-1940, MRN [id number] PCP: Irven Coe, MD  [facility] Providers Cardiologist:  Jodelle Red, MD {  History of Present Illness: .   Rhonda Richardson is a 82 y.o. 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 She was referred by Dr. Gala Romney for CV risk reduction and prevention  Pertinent CV history:  -Diagnosed with R breast Ca 08/2021 (ER+/PR-/HER2+). Had lumpectomy and re-excision for positive margins 2023. Received docetaxel/carboplatin/herceptin initially and then herceptin alone as well as XRT. Monitoring echoes had been stable until 06/2022, when EF and GLS slightly reduced. However, this was felt to be inaccurate, herceptin restarted, echoes have been stable. Completed herceptin 10/2022. -FH: mother had MI at 47, passed in her 41s. Father died from MI/HF age 34 (smoker). Mat GF died of MI age 22. Brother with possible MI in his 54s, died in his 68s during stress test (obese). Another brother with heart disease (obese, smoker) -CAC 08/2022 987. Started on rosuvastatin. 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 Ca score 916, 90th percentile. No significant stenosis by FFR.  Today: Discussed the use of AI scribe software for clinical note transcription with the patient, who gave verbal consent to proceed.  History of Present Illness   The patient, with a history of breast cancer and currently on anastrozole, is seen today due to a significant family history of heart disease and abnormal cardiac testing. She has been on rosuvastatin, which has successfully lowered her cholesterol levels. However, she reported potential side effects of constipation and stiffness upon standing, which she was unsure if it was related to the medication or her iron supplement. The patient  has been self-managing constipation with over-the-counter remedies and stool softeners. She has also been taking iron supplements due to history of anemia, which we also discussed can cause constipation.   We reviewed her test results today, see below. Discussed recommendations for management based on guidelines. Also discussed lp(a) today.   The patient expressed a desire to minimize medication intake and was considering lifestyle changes to improve her cholesterol levels. She reported a decrease in physical activity since the closure of her gym due to COVID-19 and expressed difficulty in re-establishing a regular exercise routine. She also discussed her dietary habits, including a preference for eggs and a dislike for shellfish, and expressed a willingness to make dietary adjustments if necessary.       ROS: Denies chest pain, shortness of breath at rest or with normal exertion. No PND, orthopnea, LE edema or unexpected weight gain. No syncope or palpitations. ROS otherwise negative except as noted.   Studies Reviewed: Marland Kitchen    EKG:       Physical Exam:   VS:  BP 124/70   Pulse 70   Ht 5\' 2"  (1.575 m)   Wt 142 lb 3.2 oz (64.5 kg)   SpO2 99%   BMI 26.01 kg/m    Wt Readings from Last 3 Encounters:  03/03/23 142 lb 3.2 oz (64.5 kg)  01/03/23 142 lb 6 oz (64.6 kg)  12/24/22 140 lb 14 oz (63.9 kg)    GEN: Well nourished, well developed in no acute distress HEENT: Normal, moist mucous membranes NECK: No JVD CARDIAC: regular rhythm, normal S1 and S2, no rubs or gallops. No murmur. VASCULAR: Radial and DP pulses 2+  bilaterally. No carotid bruits RESPIRATORY:  Clear to auscultation without rales, wheezing or rhonchi  ABDOMEN: Soft, non-tender, non-distended MUSCULOSKELETAL:  Ambulates independently SKIN: Warm and dry, no edema NEUROLOGIC:  Alert and oriented x 3. No focal neuro deficits noted. PSYCHIATRIC:  Normal affect    ASSESSMENT AND PLAN: .    CAD Strong family history of  CAD -moderate CAD by CCTA 12/2022, FFR negative -Ca score >900, 90th percentile -will check lp(a) today, discussed today -on aspirin, rosuvastatin 20 mg daily -LDL goal <70 -lipids 08/2022 (no statin) Tchol 222, TG, HDL 54, LDL 137 -lipids 02/2023 (with statin) Tchol 157, TG 79, HDL 52, LDL 90 -discussed several options today.  1) increased rosuvastatin to 40 mg  2) keep rosuvastatin 20 mg and add ezetimibe  3) stay on current 20 mg rosuvastatin dose, focus on lifestyle -she does not have Medicare part D--does not want non-generic meds. Nexletol and PCSK9i are therefore not options. -after shared decision making, she will work on optimizing her diet and increasing her activity. We will recheck before her visit in 6 mos  Hypertension -well controlled today -continue benazepril, HCTZ  CV risk counseling and prevention -recommend heart healthy/Mediterranean diet, with whole grains, fruits, vegetable, fish, lean meats, nuts, and olive oil. Limit salt. -recommend moderate walking, 3-5 times/week for 30-50 minutes each session. Aim for at least 150 minutes.week. Goal should be pace of 3 miles/hours, or walking 1.5 miles in 30 minutes -recommend avoidance of tobacco products. Avoid excess alcohol.  Dispo: 6 mos or sooner as needed  Signed, Jodelle Red, MD   Jodelle Red, MD, PhD, [facility]  [facility]  [place]  [facility] at [facility] at [facility] [address] [place], [place] [address] [phone]

## 2023-03-03 ENCOUNTER — Encounter (HOSPITAL_BASED_OUTPATIENT_CLINIC_OR_DEPARTMENT_OTHER): Payer: Self-pay | Admitting: Cardiology

## 2023-03-03 ENCOUNTER — Ambulatory Visit (INDEPENDENT_AMBULATORY_CARE_PROVIDER_SITE_OTHER): Payer: Medicare Other | Admitting: Cardiology

## 2023-03-03 VITALS — BP 124/70 | HR 70 | Ht 62.0 in | Wt 142.2 lb

## 2023-03-03 DIAGNOSIS — Z7189 Other specified counseling: Secondary | ICD-10-CM | POA: Diagnosis not present

## 2023-03-03 DIAGNOSIS — E7800 Pure hypercholesterolemia, unspecified: Secondary | ICD-10-CM

## 2023-03-03 DIAGNOSIS — Z923 Personal history of irradiation: Secondary | ICD-10-CM | POA: Diagnosis not present

## 2023-03-03 DIAGNOSIS — Z712 Person consulting for explanation of examination or test findings: Secondary | ICD-10-CM

## 2023-03-03 DIAGNOSIS — I10 Essential (primary) hypertension: Secondary | ICD-10-CM | POA: Diagnosis not present

## 2023-03-03 DIAGNOSIS — Z8249 Family history of ischemic heart disease and other diseases of the circulatory system: Secondary | ICD-10-CM

## 2023-03-03 DIAGNOSIS — I2510 Atherosclerotic heart disease of native coronary artery without angina pectoris: Secondary | ICD-10-CM

## 2023-03-03 DIAGNOSIS — Z9889 Other specified postprocedural states: Secondary | ICD-10-CM | POA: Diagnosis not present

## 2023-03-03 DIAGNOSIS — Z853 Personal history of malignant neoplasm of breast: Secondary | ICD-10-CM | POA: Diagnosis not present

## 2023-03-03 HISTORY — DX: Atherosclerotic heart disease of native coronary artery without angina pectoris: I25.10

## 2023-03-03 NOTE — Patient Instructions (Signed)
Medication Instructions:  Your physician recommends that you continue on your current medications as directed. Please refer to the Current Medication list given to you today.   *If you need a refill on your cardiac medications before your next appointment, please call your pharmacy*  Lab Work: LPa today   FASTING LP IN 6 MONTHS ABOUT 1 WEEK   If you have labs (blood work) drawn today and your tests are completely normal, you will receive your results only by: MyChart Message (if you have MyChart) OR A paper copy in the mail If you have any lab test that is abnormal or we need to change your treatment, we will call you to review the results.  Testing/Procedures: NONE   Follow-Up: At [facility], you and your health needs are our priority.  As part of our continuing mission to provide you with exceptional heart care, we have created designated Provider Care Teams.  These Care Teams include your primary Cardiologist (physician) and Advanced Practice Providers (APPs -  Physician Assistants and Nurse Practitioners) who all work together to provide you with the care you need, when you need it.  We recommend signing up for the patient portal called "MyChart".  Sign up information is provided on this After Visit Summary.  MyChart is used to connect with patients for Virtual Visits (Telemedicine).  Patients are able to view lab/test results, encounter notes, upcoming appointments, etc.  Non-urgent messages can be sent to your provider as well.   To learn more about what you can do with MyChart, go to [email or web address].    Your next appointment:   6 month(s)  Provider:   Jodelle Red, MD or Gillian Shields, NP

## 2023-03-05 LAB — LIPID PANEL
Chol/HDL Ratio: 2.4 ratio (ref 0.0–4.4)
Cholesterol, Total: 142 mg/dL (ref 100–199)
HDL: 60 mg/dL (ref >39)
LDL Chol Calc (NIH): 66 mg/dL (ref 0–99)
Triglycerides: 86 mg/dL (ref 0–149)
VLDL Cholesterol Cal: 16 mg/dL (ref 5–40)

## 2023-03-05 LAB — LIPOPROTEIN A (LPA): Lipoprotein (a): 17.4 nmol/L (ref <75.0)

## 2023-03-15 ENCOUNTER — Other Ambulatory Visit: Payer: Self-pay | Admitting: Urology

## 2023-03-22 ENCOUNTER — Telehealth: Payer: Self-pay | Admitting: *Deleted

## 2023-03-22 NOTE — Telephone Encounter (Signed)
   Primary Cardiologist: Jodelle Red, MD  Chart reviewed as part of pre-operative protocol coverage. Given past medical history and time since last visit, based on ACC/AHA guidelines, Rhonda Richardson would be at acceptable risk for the planned procedure without further cardiovascular testing.   Per office protocol, she may hold aspirin for 5-7 days prior to procedure and should resume as soon as hemodynamically stable postoperatively.   Patient was advised that if she develops new symptoms prior to surgery to contact our office to arrange a follow-up appointment. She verbalized understanding.  I will route this recommendation to the requesting party via Epic fax function and remove from pre-op pool.  Please call with questions.  Levi Aland, NP-C  03/22/2023, 11:10 AM [address] Office [phone] Fax [phone]

## 2023-03-22 NOTE — Telephone Encounter (Signed)
   Pre-operative Risk Assessment    Patient Name: Rhonda Richardson  DOB: Jul 27, 1940 MRN: [id number]  DATE OF LAST VISIT: 03/03/23 DR. BRIDGETTE CHRISTOPHER DATE OF NEXT VISIT: NONE    Request for Surgical Clearance    Procedure:   CYSTO, BLADDER Bx AND FULGURATION  Date of Surgery:  Clearance 03/29/23                                 Surgeon:  DR. MARYELLEN PACE Surgeon's Group or Practice Name:  [facility] Phone number:  [phone] Fax number:  [phone]   Type of Clearance Requested:   - Medical  - Pharmacy:  Hold Aspirin x 5 DAYS PRIOR   Type of Anesthesia:  General    Additional requests/questions:    Elpidio Anis   03/22/2023, 11:02 AM

## 2023-03-24 ENCOUNTER — Encounter (HOSPITAL_COMMUNITY): Payer: Self-pay

## 2023-03-24 NOTE — Patient Instructions (Addendum)
SURGICAL WAITING ROOM VISITATION Patients having surgery or a procedure may have no more than 2 support people in the waiting area - these visitors may rotate.    Children under the age of 33 must have an adult with them who is not the patient.  If the patient needs to stay at the hospital during part of their recovery, the visitor guidelines for inpatient rooms apply. Pre-op nurse will coordinate an appropriate time for 1 support person to accompany patient in pre-op.  This support person may not rotate.    Please refer to the [facility] website for the visitor guidelines for Inpatients (after your surgery is over and you are in a regular room).       Your procedure is scheduled on: 03-29-23   Report to [facility] Main Entrance    Report to admitting at 1:15 PM   Call this number if you have problems the morning of surgery [phone]   Do not eat food or drink liquids :After Midnight.          If you have questions, please contact your surgeon's office.   FOLLOW  ANY ADDITIONAL PRE OP INSTRUCTIONS YOU RECEIVED FROM YOUR SURGEON'S OFFICE!!!     Oral Hygiene is also important to reduce your risk of infection.                                    Remember - BRUSH YOUR TEETH THE MORNING OF SURGERY WITH YOUR REGULAR TOOTHPASTE   Do NOT smoke after Midnight   Take these medicines the morning of surgery with A SIP OF WATER:   Anastrozole  Famotidine  Rosuvastatin  Stop all vitamins and herbal supplements 7 days before surgery                              You may not have any metal on your body including hair pins, jewelry, and body piercing             Do not wear make-up, lotions, powders, perfumes or deodorant  Do not wear nail polish including gel and S&S, artificial/acrylic nails, or any other type of covering on natural nails including finger and toenails. If you have artificial nails, gel coating, etc. that needs to be removed by a nail salon please have this  removed prior to surgery or surgery may need to be canceled/ delayed if the surgeon/ anesthesia feels like they are unable to be safely monitored.   Do not shave  48 hours prior to surgery.          Do not bring valuables to the hospital. [facility] IS NOT RESPONSIBLE   FOR VALUABLES.   Contacts, dentures or bridgework may not be worn into surgery.  DO NOT BRING YOUR HOME MEDICATIONS TO THE HOSPITAL. PHARMACY WILL DISPENSE MEDICATIONS LISTED ON YOUR MEDICATION LIST TO YOU DURING YOUR ADMISSION IN THE HOSPITAL!    Patients discharged on the day of surgery will not be allowed to drive home.  Someone NEEDS to stay with you for the first 24 hours after anesthesia.               Please read over the following fact sheets you were given: IF YOU HAVE QUESTIONS ABOUT YOUR PRE-OP INSTRUCTIONS PLEASE CALL [phone] Gwen  If you received a COVID test during your pre-op visit  it is requested that you wear a mask when out in public, stay away from anyone that may not be feeling well and notify your surgeon if you develop symptoms. If you test positive for Covid or have been in contact with anyone that has tested positive in the last 10 days please notify you surgeon.  [place] - Preparing for Surgery Before surgery, you can play an important role.  Because skin is not sterile, your skin needs to be as free of germs as possible.  You can reduce the number of germs on your skin by washing with CHG (chlorahexidine gluconate) soap before surgery.  CHG is an antiseptic cleaner which kills germs and bonds with the skin to continue killing germs even after washing. Please DO NOT use if you have an allergy to CHG or antibacterial soaps.  If your skin becomes reddened/irritated stop using the CHG and inform your nurse when you arrive at Short Stay. Do not shave (including legs and underarms) for at least 48 hours prior to the first CHG shower.  You may shave your face/neck.  Please follow these instructions  carefully:  1.  Shower with CHG Soap the night before surgery and the  morning of surgery.  2.  If you choose to wash your hair, wash your hair first as usual with your normal  shampoo.  3.  After you shampoo, rinse your hair and body thoroughly to remove the shampoo.                             4.  Use CHG as you would any other liquid soap.  You can apply chg directly to the skin and wash.  Gently with a scrungie or clean washcloth.  5.  Apply the CHG Soap to your body ONLY FROM THE NECK DOWN.   Do   not use on face/ open                           Wound or open sores. Avoid contact with eyes, ears mouth and   genitals (private parts).                       Wash face,  Genitals (private parts) with your normal soap.             6.  Wash thoroughly, paying special attention to the area where your    surgery  will be performed.  7.  Thoroughly rinse your body with warm water from the neck down.  8.  DO NOT shower/wash with your normal soap after using and rinsing off the CHG Soap.                9.  Pat yourself dry with a clean towel.            10.  Wear clean pajamas.            11.  Place clean sheets on your bed the night of your first shower and do not  sleep with pets. Day of Surgery : Do not apply any lotions/deodorants the morning of surgery.  Please wear clean clothes to the hospital/surgery center.  FAILURE TO FOLLOW THESE INSTRUCTIONS MAY RESULT IN THE CANCELLATION OF YOUR SURGERY  PATIENT SIGNATURE_________________________________  NURSE SIGNATURE__________________________________  ________________________________________________________________________

## 2023-03-24 NOTE — Progress Notes (Addendum)
COVID Vaccine Completed:  Date of COVID positive in last 90 days:  No  PCP - Irven Coe, MD Cardiologist - Jodelle Red, MD  Cardiac clearance in Epic dated 03-22-23 by Eligha Bridegroom, NP-C  Chest x-ray - N/A EKG - 12-17-22 Epic Stress Test - Yes, several years ago ECHO - 11-30-22 Epic Cardiac Cath - N/A Pacemaker/ICD device last checked: Spinal Cord Stimulator:  N/A Coronary CT - 12-10-22 Epic  Bowel Prep - N/A  Sleep Study - N/A CPAP -   Fasting Blood Sugar - N/A Checks Blood Sugar _____ times a day  Last dose of GLP1 agonist-  N/A GLP1 instructions:  Hold 7 days before surgery    Last dose of SGLT-2 inhibitors-  N/A SGLT-2 instructions:  Hold 3 days before surgery    Blood Thinner Instructions:  Time Aspirin Instructions:  ASA 81.  Hold 5 to 7 days, patient was advised Last Dose: 03-25-23, will hold until after surgery   Activity level:  Can go up a flight of stairs and perform activities of daily living without stopping and without symptoms of chest pain or shortness of breath.  Able to exercise without symptoms.  Anesthesia review:  Nonobstructive CAD, Poor R wave progression on recent EKG, HTN  Patient denies shortness of breath, fever, cough and chest pain at PAT appointment  Patient verbalized understanding of instructions that were given to them at the PAT appointment. Patient was also instructed that they will need to review over the PAT instructions again at home before surgery.

## 2023-03-25 ENCOUNTER — Other Ambulatory Visit: Payer: Self-pay

## 2023-03-25 ENCOUNTER — Encounter (HOSPITAL_COMMUNITY): Payer: Self-pay

## 2023-03-25 ENCOUNTER — Encounter (HOSPITAL_COMMUNITY)
Admission: RE | Admit: 2023-03-25 | Discharge: 2023-03-25 | Disposition: A | Discharge status: Home / Self Care | Payer: Medicare Other | Source: Ambulatory Visit | Attending: Urology | Admitting: Urology

## 2023-03-25 VITALS — BP 126/72 | HR 102 | Temp 98.3°F | Resp 16 | Ht 62.0 in | Wt 141.4 lb

## 2023-03-25 DIAGNOSIS — Z923 Personal history of irradiation: Secondary | ICD-10-CM | POA: Diagnosis not present

## 2023-03-25 DIAGNOSIS — I10 Essential (primary) hypertension: Secondary | ICD-10-CM | POA: Diagnosis not present

## 2023-03-25 DIAGNOSIS — Z853 Personal history of malignant neoplasm of breast: Secondary | ICD-10-CM | POA: Insufficient documentation

## 2023-03-25 DIAGNOSIS — Z87891 Personal history of nicotine dependence: Secondary | ICD-10-CM | POA: Insufficient documentation

## 2023-03-25 DIAGNOSIS — D649 Anemia, unspecified: Secondary | ICD-10-CM | POA: Diagnosis not present

## 2023-03-25 DIAGNOSIS — Z9221 Personal history of antineoplastic chemotherapy: Secondary | ICD-10-CM | POA: Diagnosis not present

## 2023-03-25 DIAGNOSIS — I2510 Atherosclerotic heart disease of native coronary artery without angina pectoris: Secondary | ICD-10-CM | POA: Diagnosis not present

## 2023-03-25 DIAGNOSIS — N329 Bladder disorder, unspecified: Secondary | ICD-10-CM | POA: Diagnosis not present

## 2023-03-25 DIAGNOSIS — Z01812 Encounter for preprocedural laboratory examination: Secondary | ICD-10-CM | POA: Diagnosis not present

## 2023-03-25 HISTORY — DX: Gastro-esophageal reflux disease without esophagitis: K21.9

## 2023-03-25 HISTORY — DX: Anemia, unspecified: D64.9

## 2023-03-25 LAB — CBC
HCT: 35.4 % — ABNORMAL LOW (ref 36.0–46.0)
Hemoglobin: 11.2 g/dL — ABNORMAL LOW (ref 12.0–15.0)
MCH: 26.8 pg (ref 26.0–34.0)
MCHC: 31.6 g/dL (ref 30.0–36.0)
MCV: 84.7 fL (ref 80.0–100.0)
Platelets: 228 10*3/uL (ref 150–400)
RBC: 4.18 MIL/uL (ref 3.87–5.11)
RDW: 14 % (ref 11.5–15.5)
WBC: 4.9 10*3/uL (ref 4.0–10.5)
nRBC: 0 % (ref 0.0–0.2)

## 2023-03-25 LAB — BASIC METABOLIC PANEL
Anion gap: 6 (ref 5–15)
BUN: 28 mg/dL — ABNORMAL HIGH (ref 8–23)
CO2: 28 mmol/L (ref 22–32)
Calcium: 9.1 mg/dL (ref 8.9–10.3)
Chloride: 103 mmol/L (ref 98–111)
Creatinine, Ser: 0.95 mg/dL (ref 0.44–1.00)
GFR, Estimated: >60 mL/min (ref >60)
Glucose, Bld: 108 mg/dL — ABNORMAL HIGH (ref 70–99)
Potassium: 3.6 mmol/L (ref 3.5–5.1)
Sodium: 137 mmol/L (ref 135–145)

## 2023-03-25 NOTE — H&P (Signed)
CC/HPI: c: gross hematuria   10/27/22: 82 year old woman comes in with gross hematuria. This occurred on 2 separate occasions when she was constipated she had difficulty initiating her stream. She needed to push on her urethra to void and when she did that she had gross hematuria for the first few voids following that. This occurred while she was on chemotherapy for breast cancer. Since then she has had intermittent dysuria. [facility] 20 years ago for overactive bladder which has since improved. She recently saw her gynecologist Dr. Hollice Espy and underwent pelvic floor physical therapy. He told her she had a mild cystocele. Urinalysis today does not show any microscopic hematuria. PVR today is 65 cc.   12/10/2022: 82 year old woman here for cystoscopy as part of gross hematuria evaluation. CT scan was obtained and showed some mild enhancement of the bladder wall suggesting cystitis. Patient's not had any further episodes of hematuria. Her urinalysis does show a lot of white blood cells today.   03/02/2023: 82 year old woman who developed gross hematuria found to have 3 focal areas of erythema on cystoscopy. We discussed biopsy versus suppressive antibiotics and repeat cystoscopy. She is here today for repeat cystoscopy.     ALLERGIES: Macrodantin    MEDICATIONS: Aspirin 81 mg tablet,chewable  Hydrochlorothiazide 12.5 mg tablet  Anastrozole  Benazepril Hcl 20 mg tablet  Cephalexin 250 mg capsule 1 capsule PO Daily  Multi Vitamin  Rosuvastatin Calcium 20 mg tablet     GU PSH: Cystoscopy - 12/10/2022 Locm 300-399Mg /Ml Iodine,1Ml - 11/16/2022       PSH Notes: Right lumpectomy (margins)  Cyst in hip 2019  DNC 1970   NON-GU PSH: Breast lumpectomy, Right - 2023 Cataract surgery, Bilateral - 2019         GU PMH: Gross hematuria - 12/10/2022, - 11/16/2022, - 10/27/2022 Straining on Urination - 12/10/2022, - 10/27/2022 Urinary Hesitancy - 12/10/2022, - 10/27/2022      PMH Notes:   1898-04-12 00:00:00 - Note: Normal Routine History And Physical Adult   NON-GU PMH: Breast Cancer, History Hypercholesterolemia Hypertension    FAMILY HISTORY: 1 Daughter - Runs in Family 2 sons - Runs in Family   SOCIAL HISTORY: Marital Status: Married Preferred Language: English; Ethnicity: Not Hispanic Or Latino; Race: White Current Smoking Status: Patient does not smoke anymore. Smoked for 4 years. Smoked 1 pack per day.  <DIV'  Tobacco Use Assessment Completed:  Used Tobacco in last 30 days?   Does not use smokeless tobacco. Does not use drugs. Does not drink caffeine. Has not had a blood transfusion.    REVIEW OF SYSTEMS:     GU Review Female:  Patient denies frequent urination, hard to postpone urination, burning /pain with urination, get up at night to urinate, leakage of urine, stream starts and stops, trouble starting your stream, have to strain to urinate, and being pregnant.    Gastrointestinal (Upper):  Patient denies nausea, vomiting, and indigestion/ heartburn.    Gastrointestinal (Lower):  Patient denies diarrhea and constipation.    Constitutional:  Patient denies fever, night sweats, weight loss, and fatigue.    Skin:  Patient denies skin rash/ lesion and itching.    Eyes:  Patient denies blurred vision and double vision.    Ears/ Nose/ Throat:  Patient denies sore throat and sinus problems.    Hematologic/Lymphatic:  Patient denies swollen glands and easy bruising.    Cardiovascular:  Patient denies leg swelling and chest pains.    Respiratory:  Patient  denies cough and shortness of breath.    Endocrine:  Patient denies excessive thirst.    Musculoskeletal:  Patient denies back pain and joint pain.    Neurological:  Patient denies headaches and dizziness.    Psychologic:  Patient denies depression and anxiety.    VITAL SIGNS: None     GU PHYSICAL EXAMINATION:      Urethral Meatus: Normal size. Normal position. No discharge.     Urethra: No tenderness, no  mass, no scarring. No hypermobility. No leakage.     MULTI-SYSTEM PHYSICAL EXAMINATION:      Constitutional: Well-nourished. No physical deformities. Normally developed. Good grooming.     Neck: Neck symmetrical, not swollen. Normal tracheal position.     Respiratory: No labored breathing, no use of accessory muscles.      Skin: No paleness, no jaundice, no cyanosis. No lesion, no ulcer, no rash.     Neurologic / Psychiatric: Oriented to time, oriented to place, oriented to person. No depression, no anxiety, no agitation.     Eyes: Normal conjunctivae. Normal eyelids.     Ears, Nose, Mouth, and Throat: Left ear no scars, no lesions, no masses. Right ear no scars, no lesions, no masses. Nose no scars, no lesions, no masses. Normal hearing. Normal lips.     Musculoskeletal: Normal gait and station of head and neck.            Complexity of Data:   Records Review:  Previous Patient Records, POC Tool  Urine Test Review:  Urinalysis   PROCEDURES:    Flexible Cystoscopy - 52000  Risks, benefits, and some of the potential complications of the procedure were discussed at length with the patient including infection, bleeding, voiding discomfort, urinary retention, fever, chills, sepsis, and others. All questions were answered. Informed consent was obtained. Antibiotic prophylaxis was given. Sterile technique and intraurethral analgesia were used.  Meatus:  Normal size. Normal location. Normal condition.  Urethra:  No hypermobility. No leakage.  Ureteral Orifices:  Normal location. Normal size. Normal shape. Effluxed clear urine.  Bladder:  3 focal areas of erythema on the superior posterior bladder wall slightly to the right. This has not improved after course of antibiotics.      The lower urinary tract was carefully examined. The procedure was well-tolerated and without complications. Antibiotic instructions were given. Instructions were given to call the office immediately for bloody urine,  difficulty urinating, urinary retention, painful or frequent urination, fever, chills, nausea, vomiting or other illness. The patient stated that she understood these instructions and would comply with them.    Urinalysis  Dipstick Dipstick Cont'd  Color: Yellow Bilirubin: Neg mg/dL  Appearance: Clear Ketones: Neg mg/dL  Specific Gravity: 7.829 Blood: Neg ery/uL  pH: 5.5 Protein: Neg mg/dL  Glucose: Neg mg/dL Urobilinogen: 0.2 mg/dL   Nitrites: Neg   Leukocyte Esterase: Neg leu/uL    ASSESSMENT:     ICD-10 Details  1 GU:  Gross hematuria - R31.0 Undiagnosed New Problem  2  Bladder tumor/neoplasm - D41.4 Undiagnosed New Problem   PLAN:   Document  Letter(s):  Created for Patient: Clinical Summary   Notes:  Gross hematuria:  -Repeat cystoscopy shows focal area of erythema concerning for CIS versus inflammation  -Risks and benefits of the procedure were discussed with the patient in detail including but not limited to pain, bleeding, infection, damage to surrounding structures, bladder perforation, need for additional treatment.   Schedule next available cystoscopy with bladder biopsy and fulguration

## 2023-03-28 ENCOUNTER — Encounter (HOSPITAL_COMMUNITY): Payer: Self-pay

## 2023-03-28 NOTE — Progress Notes (Signed)
Case: [id number] Date/Time: 03/29/23 1520   Procedures:      CYSTOSCOPY WITH BLADDER BIOPSY - 30 MINUTES     CYSTOSCOPY WITH FULGERATION   Anesthesia type: General   Pre-op diagnosis: BLADDER LESION   Location: [facility] ROOM 01 / [facility]   Surgeons: Noel Christmas, MD       DISCUSSION: Rhonda Richardson is an 82 yo female who presents to PAT prior to surgery above. PMH of former smoking, HTN, CAD, GERD, hx of breast cancer s/p lumpectomy, XRT/chemo, anemia, hematuria.  Patient was referred to Cardiology in 06/2022 due to abnormal echo (06/21/22) obtained by Oncology as she was on herceptin. Echo was repeated on 09/01/22 and appeared stable and echo was repeated again on 11/30/22 and appeared mildly improved. Cardiac CT was obtained as well which showed moderate nonobstructive CAD. Pt received cardiac clearance for upcoming surgery:  "Chart reviewed as part of pre-operative protocol coverage. Given past medical history and time since last visit, based on ACC/AHA guidelines, Rhonda Richardson would be at acceptable risk for the planned procedure without further cardiovascular testing.    Per office protocol, she may hold aspirin for 5-7 days prior to procedure and should resume as soon as hemodynamically stable postoperatively."  VS: BP 126/72   Pulse (!) 102   Temp 36.8 C (Oral)   Resp 16   Ht 5\' 2"  (1.575 m)   Wt 64.1 kg   SpO2 97%   BMI 25.86 kg/m   PROVIDERS: Irven Coe, MD Oncology: Rachel Moulds, MD Cardiology: Jodelle Red, MD   LABS: Labs reviewed: Acceptable for surgery. (all labs ordered are listed, but only abnormal results are displayed)  Labs Reviewed  BASIC METABOLIC PANEL - Abnormal; Notable for the following components:      Result Value   Glucose, Bld 108 (*)    BUN 28 (*)    All other components within normal limits  CBC - Abnormal; Notable for the following components:   Hemoglobin 11.2 (*)    HCT 35.4 (*)    All other components within normal limits      IMAGES:  CT Chest 12/09/22:  IMPRESSION: 1. A 9 mm calcified splenic artery aneurysm. Given size, this needs no further imaging follow-up. 2. 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 3.  Aortic Atherosclerosis (ICD10-I70.0).   EKG:   CV:  Cadiac CT 12/09/22:  IMPRESSION: 1. Moderate CAD, CADRADS = 3. There is a portion of both the mid LAD and mid LCx concerning for 50-69% stenosis, though there is step artifact in this region that limits interpretation. CT FFR will be performed and reported separately.   2. Coronary calcium score of 916. This was 90th percentile for age-, sex-, and race- matched controls.   3. Total plaque volume 781 mm3 which is 80th percentile for age- and sex- matched controls (calcified plaque 169 mm3; noncalcified plaque 612 mm3). Total plaque volume is extensive.   4. Normal coronary origin with right dominance.   5. Aortic atherosclerosis.  FFR 12/10/22  IMPRESSION: 1.  CT FFR analysis did not show any significant stenosis.   Echo 11/30/22:  IMPRESSIONS    1. GLS mildly abnormal -16.2% this echo, which has improved compared with -14.6% 06/2022. Left ventricular ejection fraction, by estimation, is 55 to 60%. The left ventricle has normal function. The left ventricle has no regional wall motion abnormalities. Left ventricular diastolic parameters are consistent with Grade I diastolic dysfunction (impaired relaxation). The average left ventricular global longitudinal strain is -  16.2 %.  2. Right ventricular systolic function is normal. The right ventricular size is normal. There is normal pulmonary artery systolic pressure.  3. The mitral valve is normal in structure. Mild mitral valve regurgitation. No evidence of mitral stenosis.  4. Tricuspid valve regurgitation is mild to moderate.  5. The aortic valve is tricuspid. There is mild calcification of the aortic valve. There is mild thickening of the aortic valve.  Aortic valve regurgitation is not visualized. No aortic stenosis is present.  6. The inferior vena cava is normal in size with greater than 50% respiratory variability, suggesting right atrial pressure of 3 mmHg.  Past Medical History:  Diagnosis Date   Anemia    Breast cancer (HCC)    GERD (gastroesophageal reflux disease)    Hypertension    Nonocclusive coronary atherosclerosis of native coronary artery 03/03/2023    Past Surgical History:  Procedure Laterality Date   BREAST LUMPECTOMY WITH RADIOACTIVE SEED AND SENTINEL LYMPH NODE BIOPSY Right 09/15/2021   Procedure: RIGHT BREAST SEED BRACKETED LUMPECTOMY AND SENTINEL NODE BIOPSY;  Surgeon: Almond Lint, MD;  Location: [facility];  Service: General;  Laterality: Right;   BREAST REDUCTION SURGERY Left 12/24/2022   Procedure: MAMMARY REDUCTION  (BREAST);  Surgeon: Glenna Fellows, MD;  Location: [facility];  Service: Plastics;  Laterality: Left;   cataract surgery Bilateral    DILATION AND CURETTAGE OF UTERUS  1970   EXCISION OF BREAST LESION Right 10/15/2021   Procedure: ASPIRATION RIGHT AXILLARY SEROMA;  Surgeon: Almond Lint, MD;  Location: [facility];  Service: General;  Laterality: Right;   eyelid tendon repair Bilateral    MASTOPEXY Right 12/24/2022   Procedure: MASTOPEXY;  Surgeon: Glenna Fellows, MD;  Location: [facility];  Service: Plastics;  Laterality: Right;   PORT-A-CATH REMOVAL Left 12/24/2022   Procedure: REMOVAL CHEST PORT;  Surgeon: Glenna Fellows, MD;  Location: [facility];  Service: Plastics;  Laterality: Left;   PORTACATH PLACEMENT N/A 09/15/2021   Procedure: PORT PLACEMENT;  Surgeon: Almond Lint, MD;  Location: [facility];  Service: General;  Laterality: N/A;   RE-EXCISION OF BREAST LUMPECTOMY Right 10/15/2021   Procedure: RE-EXCISION LUMPECTOMY RIGHT BREAST;  Surgeon: Almond Lint, MD;  Location: [facility];  Service: General;   Laterality: Right;    MEDICATIONS:  anastrozole (ARIMIDEX) 1 MG tablet   aspirin EC 81 MG tablet   benazepril (LOTENSIN) 20 MG tablet   cholecalciferol (VITAMIN D3) 25 MCG (1000 UNIT) tablet   famotidine (PEPCID) 20 MG tablet   hydrochlorothiazide (MICROZIDE) 12.5 MG capsule   magnesium oxide (MAG-OX) 400 (240 Mg) MG tablet   Multiple Vitamin (MULTIVITAMIN WITH MINERALS) TABS tablet   Polysaccharide Iron Complex (FERREX 150 PO)   rosuvastatin (CRESTOR) 20 MG tablet   No current facility-administered medications for this encounter.   Marcille Blanco [facility] Short Stay/Anesthesiology [facility] Phone [phone] 03/28/2023 9:05 AM

## 2023-03-28 NOTE — Anesthesia Preprocedure Evaluation (Addendum)
Anesthesia Evaluation  Patient identified by MRN, date of birth, ID band Patient awake    Reviewed: Allergy & Precautions, NPO status , Patient's Chart, lab work & pertinent test results  Airway Mallampati: II  TM Distance: >3 FB Neck ROM: Full    Dental  (+) Teeth Intact, Dental Advisory Given   Pulmonary former smoker   breath sounds clear to auscultation       Cardiovascular hypertension, Pt. on medications + CAD  + Valvular Problems/Murmurs (mild MR) MR  Rhythm:Regular Rate:Normal  Cadiac CT 12/09/22:   IMPRESSION: 1. Moderate CAD, CADRADS = 3. There is a portion of both the mid LAD and mid LCx concerning for 50-69% stenosis, though there is step artifact in this region that limits interpretation. CT FFR will be performed and reported separately.   2. Coronary calcium score of 916. This was 90th percentile for age-, sex-, and race- matched controls.   3. Total plaque volume 781 mm3 which is 80th percentile for age- and sex- matched controls (calcified plaque 169 mm3; noncalcified plaque 612 mm3). Total plaque volume is extensive.   4. Normal coronary origin with right dominance.   5. Aortic atherosclerosis.   FFR 12/10/22   IMPRESSION: 1.  CT FFR analysis did not show any significant stenosis.   Echo 11/30/22:   IMPRESSIONS      1. GLS mildly abnormal -16.2% this echo, which has improved compared with -14.6% 06/2022. Left ventricular ejection fraction, by estimation, is 55 to 60%. The left ventricle has normal function. The left ventricle has no regional wall motion abnormalities. Left ventricular diastolic parameters are consistent with Grade I diastolic dysfunction (impaired relaxation). The average left ventricular global longitudinal strain is -16.2 %.  2. Right ventricular systolic function is normal. The right ventricular size is normal. There is normal pulmonary artery systolic pressure.  3. The mitral  valve is normal in structure. Mild mitral valve regurgitation. No evidence of mitral stenosis.  4. Tricuspid valve regurgitation is mild to moderate.  5. The aortic valve is tricuspid. There is mild calcification of the aortic valve. There is mild thickening of the aortic valve. Aortic valve regurgitation is not visualized. No aortic stenosis is present.  6. The inferior vena cava is normal in size with greater than 50% respiratory variability, suggesting right atrial pressure of 3 mmHg    Neuro/Psych negative neurological ROS  negative psych ROS   GI/Hepatic Neg liver ROS,GERD  Medicated,,  Endo/Other  negative endocrine ROS    Renal/GU negative Renal ROS  negative genitourinary   Musculoskeletal negative musculoskeletal ROS (+)    Abdominal   Peds  Hematology  (+) Blood dyscrasia, anemia   Anesthesia Other Findings   Reproductive/Obstetrics                             Anesthesia Physical Anesthesia Plan  ASA: 3  Anesthesia Plan: General   Post-op Pain Management: Tylenol PO (pre-op)*   Induction: Intravenous  PONV Risk Score and Plan: 3 and Ondansetron, Dexamethasone and Treatment may vary due to age or medical condition  Airway Management Planned: LMA  Additional Equipment:   Intra-op Plan:   Post-operative Plan: Extubation in OR  Informed Consent: I have reviewed the patients History and Physical, chart, labs and discussed the procedure including the risks, benefits and alternatives for the proposed anesthesia with the patient or authorized representative who has indicated his/her understanding and acceptance.     Dental advisory  given  Plan Discussed with: CRNA  Anesthesia Plan Comments: (See PAT note from 12/13 by Sherlie Ban PA-C )        Anesthesia Quick Evaluation

## 2023-03-29 ENCOUNTER — Encounter (HOSPITAL_COMMUNITY): Payer: Self-pay | Admitting: Urology

## 2023-03-29 ENCOUNTER — Ambulatory Visit (HOSPITAL_BASED_OUTPATIENT_CLINIC_OR_DEPARTMENT_OTHER): Payer: Medicare Other | Admitting: Anesthesiology

## 2023-03-29 ENCOUNTER — Encounter (HOSPITAL_COMMUNITY)
Admission: RE | Disposition: A | Discharge status: Home / Self Care | Payer: Self-pay | Source: Ambulatory Visit | Attending: Urology

## 2023-03-29 ENCOUNTER — Ambulatory Visit (HOSPITAL_COMMUNITY): Payer: Self-pay | Admitting: Medical

## 2023-03-29 ENCOUNTER — Ambulatory Visit (HOSPITAL_COMMUNITY)
Admission: RE | Admit: 2023-03-29 | Discharge: 2023-03-29 | Disposition: A | Discharge status: Home / Self Care | Payer: Medicare Other | Source: Ambulatory Visit | Attending: Urology | Admitting: Urology

## 2023-03-29 DIAGNOSIS — N3289 Other specified disorders of bladder: Secondary | ICD-10-CM

## 2023-03-29 DIAGNOSIS — I340 Nonrheumatic mitral (valve) insufficiency: Secondary | ICD-10-CM | POA: Insufficient documentation

## 2023-03-29 DIAGNOSIS — I10 Essential (primary) hypertension: Secondary | ICD-10-CM | POA: Insufficient documentation

## 2023-03-29 DIAGNOSIS — D494 Neoplasm of unspecified behavior of bladder: Secondary | ICD-10-CM | POA: Diagnosis not present

## 2023-03-29 DIAGNOSIS — R310 Gross hematuria: Secondary | ICD-10-CM | POA: Diagnosis not present

## 2023-03-29 DIAGNOSIS — Z87891 Personal history of nicotine dependence: Secondary | ICD-10-CM | POA: Diagnosis not present

## 2023-03-29 DIAGNOSIS — N3021 Other chronic cystitis with hematuria: Secondary | ICD-10-CM | POA: Insufficient documentation

## 2023-03-29 DIAGNOSIS — N3020 Other chronic cystitis without hematuria: Secondary | ICD-10-CM | POA: Diagnosis not present

## 2023-03-29 DIAGNOSIS — N329 Bladder disorder, unspecified: Secondary | ICD-10-CM | POA: Diagnosis present

## 2023-03-29 DIAGNOSIS — I2510 Atherosclerotic heart disease of native coronary artery without angina pectoris: Secondary | ICD-10-CM | POA: Insufficient documentation

## 2023-03-29 DIAGNOSIS — N3000 Acute cystitis without hematuria: Secondary | ICD-10-CM | POA: Diagnosis not present

## 2023-03-29 HISTORY — PX: CYSTOSCOPY WITH BIOPSY: SHX5122

## 2023-03-29 HISTORY — PX: CYSTOSCOPY WITH FULGERATION: SHX6638

## 2023-03-29 SURGERY — CYSTOSCOPY, WITH BIOPSY
Anesthesia: General

## 2023-03-29 MED ORDER — FENTANYL CITRATE (PF) 100 MCG/2ML IJ SOLN
INTRAMUSCULAR | Status: DC | PRN
  Administered 2023-03-29: 25 ug via INTRAVENOUS
  Administered 2023-03-29: 50 ug via INTRAVENOUS
  Administered 2023-03-29: 25 ug via INTRAVENOUS

## 2023-03-29 MED ORDER — AMISULPRIDE (ANTIEMETIC) 5 MG/2ML IV SOLN
5.0000 mg | Freq: Once | INTRAVENOUS | Status: AC
Start: 2023-03-29 — End: 2023-03-29
  Administered 2023-03-29: 5 mg via INTRAVENOUS

## 2023-03-29 MED ORDER — FENTANYL CITRATE PF 50 MCG/ML IJ SOSY: 25.0000 ug | PREFILLED_SYRINGE | INTRAMUSCULAR | Status: DC | PRN

## 2023-03-29 MED ORDER — MIDAZOLAM HCL 2 MG/2ML IJ SOLN
INTRAMUSCULAR | Status: AC
  Filled 2023-03-29: qty 2

## 2023-03-29 MED ORDER — FENTANYL CITRATE (PF) 250 MCG/5ML IJ SOLN
INTRAMUSCULAR | Status: AC
  Filled 2023-03-29: qty 5

## 2023-03-29 MED ORDER — DEXAMETHASONE SODIUM PHOSPHATE 10 MG/ML IJ SOLN
INTRAMUSCULAR | Status: DC | PRN
  Administered 2023-03-29: 5 mg via INTRAVENOUS

## 2023-03-29 MED ORDER — EPHEDRINE SULFATE (PRESSORS) 50 MG/ML IJ SOLN
INTRAMUSCULAR | Status: DC | PRN
  Administered 2023-03-29: 10 mg via INTRAVENOUS
  Administered 2023-03-29: 5 mg via INTRAVENOUS

## 2023-03-29 MED ORDER — PHENYLEPHRINE HCL (PRESSORS) 10 MG/ML IV SOLN
INTRAVENOUS | Status: DC | PRN
  Administered 2023-03-29 (×3): 160 ug via INTRAVENOUS

## 2023-03-29 MED ORDER — ORAL CARE MOUTH RINSE: 15.0000 mL | Freq: Once | OROMUCOSAL | Status: AC

## 2023-03-29 MED ORDER — LACTATED RINGERS IV SOLN: INTRAVENOUS | Status: DC | PRN

## 2023-03-29 MED ORDER — PROPOFOL 10 MG/ML IV BOLUS
INTRAVENOUS | Status: AC
  Filled 2023-03-29: qty 20

## 2023-03-29 MED ORDER — ONDANSETRON HCL 4 MG/2ML IJ SOLN
INTRAMUSCULAR | Status: AC
Start: 2023-03-29 — End: ?
  Filled 2023-03-29: qty 2

## 2023-03-29 MED ORDER — STERILE WATER FOR IRRIGATION IR SOLN
Status: DC | PRN
  Administered 2023-03-29 (×2): 3000 mL

## 2023-03-29 MED ORDER — PHENYLEPHRINE 80 MCG/ML (10ML) SYRINGE FOR IV PUSH (FOR BLOOD PRESSURE SUPPORT)
PREFILLED_SYRINGE | INTRAVENOUS | Status: AC
  Filled 2023-03-29: qty 10

## 2023-03-29 MED ORDER — LIDOCAINE HCL (PF) 2 % IJ SOLN
INTRAMUSCULAR | Status: AC
  Filled 2023-03-29: qty 5

## 2023-03-29 MED ORDER — CHLORHEXIDINE GLUCONATE 0.12 % MT SOLN
15.0000 mL | Freq: Once | OROMUCOSAL | Status: AC
  Administered 2023-03-29: 15 mL via OROMUCOSAL

## 2023-03-29 MED ORDER — PROPOFOL 10 MG/ML IV BOLUS
INTRAVENOUS | Status: DC | PRN
  Administered 2023-03-29: 100 mg via INTRAVENOUS

## 2023-03-29 MED ORDER — EPHEDRINE 5 MG/ML INJ
INTRAVENOUS | Status: AC
  Filled 2023-03-29: qty 5

## 2023-03-29 MED ORDER — CEFAZOLIN SODIUM-DEXTROSE 2-4 GM/100ML-% IV SOLN
2.0000 g | INTRAVENOUS | Status: AC
  Administered 2023-03-29: 2 g via INTRAVENOUS
  Filled 2023-03-29: qty 100

## 2023-03-29 MED ORDER — DEXAMETHASONE SODIUM PHOSPHATE 10 MG/ML IJ SOLN
INTRAMUSCULAR | Status: AC
  Filled 2023-03-29: qty 1

## 2023-03-29 MED ORDER — ONDANSETRON HCL 4 MG/2ML IJ SOLN
INTRAMUSCULAR | Status: DC | PRN
  Administered 2023-03-29: 4 mg via INTRAVENOUS

## 2023-03-29 MED ORDER — TRAMADOL HCL 50 MG PO TABS: 50.0000 mg | ORAL_TABLET | Freq: Four times a day (QID) | ORAL | 0 refills | Status: AC | PRN

## 2023-03-29 MED ORDER — AMISULPRIDE (ANTIEMETIC) 5 MG/2ML IV SOLN
INTRAVENOUS | Status: AC
  Filled 2023-03-29: qty 2

## 2023-03-29 MED ORDER — ACETAMINOPHEN 500 MG PO TABS
1000.0000 mg | ORAL_TABLET | Freq: Once | ORAL | Status: AC
  Administered 2023-03-29: 1000 mg via ORAL
  Filled 2023-03-29: qty 2

## 2023-03-29 MED ORDER — LIDOCAINE HCL (CARDIAC) PF 100 MG/5ML IV SOSY
PREFILLED_SYRINGE | INTRAVENOUS | Status: DC | PRN
  Administered 2023-03-29: 60 mg via INTRAVENOUS

## 2023-03-29 SURGICAL SUPPLY — 18 items
BAG URINE DRAIN 2000ML AR STRL (UROLOGICAL SUPPLIES) IMPLANT
BAG URO CATCHER STRL LF (MISCELLANEOUS) ×1 IMPLANT
CLOTH BEACON ORANGE TIMEOUT ST (SAFETY) ×1 IMPLANT
DRAPE FOOT SWITCH (DRAPES) ×1 IMPLANT
ELECT COAG BIPOLAR CYL 1.2MMM (ELECTROSURGICAL) IMPLANT
ELECT REM PT RETURN 15FT ADLT (MISCELLANEOUS) ×1 IMPLANT
ELECTRODE COAG BIPLR CYL 1.2MM (ELECTROSURGICAL) IMPLANT
GLOVE BIO SURGEON STRL SZ 6.5 (GLOVE) ×1 IMPLANT
GLOVE SURG LX STRL 7.5 STRW (GLOVE) ×1 IMPLANT
GOWN STRL REUS W/ TWL LRG LVL3 (GOWN DISPOSABLE) ×1 IMPLANT
KIT TURNOVER KIT A (KITS) IMPLANT
LOOP CUT BIPOLAR 24F LRG (ELECTROSURGICAL) IMPLANT
MANIFOLD NEPTUNE II (INSTRUMENTS) ×1 IMPLANT
PACK CYSTO (CUSTOM PROCEDURE TRAY) ×1 IMPLANT
SYR TOOMEY IRRIG 70ML (MISCELLANEOUS) IMPLANT
SYRINGE TOOMEY IRRIG 70ML (MISCELLANEOUS) IMPLANT
TUBING CONNECTING 10 (TUBING) ×1 IMPLANT
TUBING UROLOGY SET (TUBING) IMPLANT

## 2023-03-29 NOTE — Op Note (Signed)
Operative Note  Preoperative diagnosis:  1.  Bladder lesion 2.  Gross hematuria  Postoperative diagnosis: 1.  Bladder lesion 2.  Gross hematuria  Procedure(s): 1.  Cystoscopy with bladder biopsy and fulguration  Surgeon: Kasandra Knudsen, MD  Assistants:  None  Anesthesia:  General  Complications:  None  EBL: Minimal  Specimens: 1.  Bladder biopsy  Drains/Catheters: 1.  None  Intraoperative findings:   Normal urethra Bilateral orthotopic ureteral orifices effluxing clear yellow urine Previously seen areas of abnormal bladder mucosa on posterior, superior right lateral wall (3 discrete areas) - slightly raised, erythematous mucosa (approx 0.5cm, 0.5 cm, 1 cm) Thin-walled bladder   Indication:  Rhonda Richardson is a 82 y.o. female with gross hematuria found to have bladder lesion on cystoscopy.  Description of procedure:  After risks and benefits were discussed with patient, informed consent was obtained. The patient was taken to the OR and placed in the supine position.  Anesthesia was induced and antibiotics were administered. She was then repositioned in the dorsal lithotomy position.  Patient was then prepped and draped in the usual sterile fashion and a time out was performed.   A 21Fr rigid cystocope was placed in the urethral meatus and advanced into the bladder under direct visualization. Patient had orthotopic Uos effluxing clear urine.  The 3 previously seen areas on diagnostic cystoscopy were again present.  These areas were slightly raised bladder mucosa with hemorrhagic/erythematous features.  A cold cup bladder biopsy was used to take 2 small samples from this area.  The patient did have a very thin-walled bladder.  A Bugbee was then used to fulgurate and achieve hemostasis.  The irrigant was turned off and hemostasis was noted to be adequate.  The patient's bladder was decompressed and the cystoscope was removed.  She emerged from anesthesia and transferred back  in stable condition.   Plan:  Follow up in 1 week

## 2023-03-29 NOTE — Interval H&P Note (Signed)
History and Physical Interval Note:  03/29/2023 2:13 PM  Rhonda Richardson  has presented today for surgery, with the diagnosis of BLADDER LESION.  The various methods of treatment have been discussed with the patient and family. After consideration of risks, benefits and other options for treatment, the patient has consented to  Procedure(s) with comments: CYSTOSCOPY WITH BLADDER BIOPSY (N/A) - 30 MINUTES CYSTOSCOPY WITH FULGERATION (N/A) as a surgical intervention.  The patient's history has been reviewed, patient examined, no change in status, stable for surgery.  I have reviewed the patient's chart and labs.  Questions were answered to the patient's satisfaction.     Wilfredo Canterbury D Dartanyon Frankowski

## 2023-03-29 NOTE — Transfer of Care (Signed)
Immediate Anesthesia Transfer of Care Note  Patient: RULA VRANICH  Procedure(s) Performed: CYSTOSCOPY WITH BLADDER BIOPSY CYSTOSCOPY WITH FULGERATION  Patient Location: PACU  Anesthesia Type:General  Level of Consciousness: awake, alert , and oriented  Airway & Oxygen Therapy: Patient Spontanous Breathing and Patient connected to face mask oxygen  Post-op Assessment: Report given to RN and Post -op Vital signs reviewed and stable  Post vital signs: Reviewed and stable  Last Vitals:  Vitals Value Taken Time  BP 117/73 03/29/23 1607  Temp 36.4 C 03/29/23 1607  Pulse 101 03/29/23 1611  Resp 16 03/29/23 1611  SpO2 91 % 03/29/23 1611  Vitals shown include unfiled device data.  Last Pain:  Vitals:   03/29/23 1607  TempSrc:   PainSc: 0-No pain      Patients Stated Pain Goal: 3 (03/29/23 1152)  Complications: No notable events documented.

## 2023-03-29 NOTE — Anesthesia Procedure Notes (Signed)
Procedure Name: LMA Insertion Date/Time: 03/29/2023 3:17 PM  Performed by: Thomasene Ripple, CRNAPre-anesthesia Checklist: Patient identified, Emergency Drugs available, Suction available and Patient being monitored Patient Re-evaluated:Patient Re-evaluated prior to induction Oxygen Delivery Method: Circle System Utilized Preoxygenation: Pre-oxygenation with 100% oxygen Induction Type: IV induction Ventilation: Mask ventilation without difficulty LMA: LMA inserted LMA Size: 4.0 Number of attempts: 1 Airway Equipment and Method: Bite block Placement Confirmation: positive ETCO2 Tube secured with: Tape Dental Injury: Teeth and Oropharynx as per pre-operative assessment

## 2023-03-29 NOTE — Anesthesia Postprocedure Evaluation (Signed)
Anesthesia Post Note  Patient: LEORA BONA  Procedure(s) Performed: CYSTOSCOPY WITH BLADDER BIOPSY CYSTOSCOPY WITH FULGERATION     Patient location during evaluation: PACU Anesthesia Type: General Level of consciousness: awake and alert Pain management: pain level controlled Vital Signs Assessment: post-procedure vital signs reviewed and stable Respiratory status: spontaneous breathing, nonlabored ventilation and respiratory function stable Cardiovascular status: blood pressure returned to baseline and stable Postop Assessment: no apparent nausea or vomiting Anesthetic complications: no  No notable events documented.  Last Vitals:  Vitals:   03/29/23 1645 03/29/23 1700  BP: 129/76 110/82  Pulse: 96 94  Resp: 18 15  Temp:    SpO2: 95% 95%    Last Pain:  Vitals:   03/29/23 1700  TempSrc:   PainSc: 0-No pain                 Ronica Vivian,W. EDMOND

## 2023-03-29 NOTE — Discharge Instructions (Signed)
Post Bladder Surgery Instructions   General instructions:     Your recent bladder surgery requires very little post hospital care but some definite precautions.  Despite the fact that no skin incisions were used, the area around the bladder incisions are raw and covered with scabs to promote healing and prevent bleeding. Certain precautions are needed to insure that the scabs are not disturbed over the next 2-4 weeks while the healing proceeds.  Because the raw surface inside your bladder and the irritating effects of urine you may expect frequency of urination and/or urgency (a stronger desire to urinate) and perhaps even getting up at night more often. This will usually resolve or improve slowly over the healing period. You may see some blood in your urine over the first 6 weeks. Do not be alarmed, even if the urine was clear for a while. Get off your feet and drink lots of fluids until clearing occurs. If you start to pass clots or don't improve call us.  Catheter: (If you are discharged with a catheter.)  1. Keep your catheter secured to your leg at all times with tape or the supplied strap. 2. You may experience leakage of urine around your catheter- as long as the  catheter continues to drain, this is normal.  If your catheter stops draining  go to the ER. 3. You may also have blood in your urine, even after it has been clear for  several days; you may even pass some small blood clots or other material.  This  is normal as well.  If this happens, sit down and drink plenty of water to help  make urine to flush out your bladder.  If the blood in your urine becomes worse  after doing this, contact our office or return to the ER. 4. You may use the leg bag (small bag) during the day, but use the large bag at  night.  Diet:  You may return to your normal diet immediately. Because of the raw surface of your bladder, alcohol, spicy foods, foods high in acid and drinks with caffeine may  cause irritation or frequency and should be used in moderation. To keep your urine flowing freely and avoid constipation, drink plenty of fluids during the day (8-10 glasses). Tip: Avoid cranberry juice because it is very acidic.  Activity:  Your physical activity doesn't need to be restricted. However, if you are very active, you may see some blood in the urine. We suggest that you reduce your activity under the circumstances until the bleeding has stopped.  Bowels:  It is important to keep your bowels regular during the postoperative period. Straining with bowel movements can cause bleeding. A bowel movement every other day is reasonable. Use a mild laxative if needed, such as milk of magnesia 2-3 tablespoons, or 2 Dulcolax tablets. Call if you continue to have problems. If you had been taking narcotics for pain, before, during or after your surgery, you may be constipated. Take a laxative if necessary.    Medication:  You should resume your pre-surgery medications unless told not to. In addition you may be given an antibiotic to prevent or treat infection. Antibiotics are not always necessary. All medication should be taken as prescribed until the bottles are finished unless you are having an unusual reaction to one of the drugs.   

## 2023-03-30 ENCOUNTER — Encounter (HOSPITAL_COMMUNITY): Payer: Self-pay | Admitting: Urology

## 2023-03-31 LAB — SURGICAL PATHOLOGY

## 2023-04-15 DIAGNOSIS — D414 Neoplasm of uncertain behavior of bladder: Secondary | ICD-10-CM | POA: Diagnosis not present

## 2023-04-15 DIAGNOSIS — R3911 Hesitancy of micturition: Secondary | ICD-10-CM | POA: Diagnosis not present

## 2023-04-15 DIAGNOSIS — R310 Gross hematuria: Secondary | ICD-10-CM | POA: Diagnosis not present

## 2023-04-15 DIAGNOSIS — R3916 Straining to void: Secondary | ICD-10-CM | POA: Diagnosis not present

## 2023-04-19 ENCOUNTER — Encounter: Payer: Self-pay | Admitting: Hematology and Oncology

## 2023-04-19 ENCOUNTER — Other Ambulatory Visit: Payer: Self-pay | Admitting: Hematology and Oncology

## 2023-04-19 DIAGNOSIS — Z1231 Encounter for screening mammogram for malignant neoplasm of breast: Secondary | ICD-10-CM

## 2023-04-20 ENCOUNTER — Other Ambulatory Visit: Payer: Self-pay

## 2023-04-25 ENCOUNTER — Ambulatory Visit: Payer: Medicare Other

## 2023-05-05 ENCOUNTER — Inpatient Hospital Stay: Payer: Medicare Other

## 2023-05-05 ENCOUNTER — Inpatient Hospital Stay: Payer: Medicare Other | Attending: Adult Health | Admitting: Adult Health

## 2023-05-05 ENCOUNTER — Encounter: Payer: Self-pay | Admitting: Adult Health

## 2023-05-05 VITALS — BP 141/70 | HR 77 | Temp 97.6°F | Resp 16 | Ht 62.0 in | Wt 143.8 lb

## 2023-05-05 DIAGNOSIS — Z87891 Personal history of nicotine dependence: Secondary | ICD-10-CM | POA: Insufficient documentation

## 2023-05-05 DIAGNOSIS — Z881 Allergy status to other antibiotic agents status: Secondary | ICD-10-CM | POA: Insufficient documentation

## 2023-05-05 DIAGNOSIS — Z808 Family history of malignant neoplasm of other organs or systems: Secondary | ICD-10-CM | POA: Insufficient documentation

## 2023-05-05 DIAGNOSIS — I2510 Atherosclerotic heart disease of native coronary artery without angina pectoris: Secondary | ICD-10-CM | POA: Diagnosis not present

## 2023-05-05 DIAGNOSIS — I10 Essential (primary) hypertension: Secondary | ICD-10-CM | POA: Insufficient documentation

## 2023-05-05 DIAGNOSIS — Z79899 Other long term (current) drug therapy: Secondary | ICD-10-CM | POA: Diagnosis not present

## 2023-05-05 DIAGNOSIS — Z8249 Family history of ischemic heart disease and other diseases of the circulatory system: Secondary | ICD-10-CM | POA: Diagnosis not present

## 2023-05-05 DIAGNOSIS — C50411 Malignant neoplasm of upper-outer quadrant of right female breast: Secondary | ICD-10-CM | POA: Diagnosis not present

## 2023-05-05 DIAGNOSIS — Z79811 Long term (current) use of aromatase inhibitors: Secondary | ICD-10-CM | POA: Insufficient documentation

## 2023-05-05 DIAGNOSIS — Z1721 Progesterone receptor positive status: Secondary | ICD-10-CM | POA: Insufficient documentation

## 2023-05-05 DIAGNOSIS — Z170 Estrogen receptor positive status [ER+]: Secondary | ICD-10-CM | POA: Insufficient documentation

## 2023-05-05 DIAGNOSIS — Z823 Family history of stroke: Secondary | ICD-10-CM | POA: Diagnosis not present

## 2023-05-05 LAB — CBC WITH DIFFERENTIAL (CANCER CENTER ONLY)
Abs Immature Granulocytes: 0.01 10*3/uL (ref 0.00–0.07)
Basophils Absolute: 0.1 10*3/uL (ref 0.0–0.1)
Basophils Relative: 1 %
Eosinophils Absolute: 0.2 10*3/uL (ref 0.0–0.5)
Eosinophils Relative: 4 %
HCT: 38.1 % (ref 36.0–46.0)
Hemoglobin: 12.1 g/dL (ref 12.0–15.0)
Immature Granulocytes: 0 %
Lymphocytes Relative: 34 %
Lymphs Abs: 1.5 10*3/uL (ref 0.7–4.0)
MCH: 26.3 pg (ref 26.0–34.0)
MCHC: 31.8 g/dL (ref 30.0–36.0)
MCV: 82.8 fL (ref 80.0–100.0)
Monocytes Absolute: 0.4 10*3/uL (ref 0.1–1.0)
Monocytes Relative: 8 %
Neutro Abs: 2.4 10*3/uL (ref 1.7–7.7)
Neutrophils Relative %: 53 %
Platelet Count: 220 10*3/uL (ref 150–400)
RBC: 4.6 MIL/uL (ref 3.87–5.11)
RDW: 14.6 % (ref 11.5–15.5)
WBC Count: 4.6 10*3/uL (ref 4.0–10.5)
nRBC: 0 % (ref 0.0–0.2)

## 2023-05-05 LAB — CMP (CANCER CENTER ONLY)
ALT: 17 U/L (ref 0–44)
AST: 16 U/L (ref 15–41)
Albumin: 4.3 g/dL (ref 3.5–5.0)
Alkaline Phosphatase: 59 U/L (ref 38–126)
Anion gap: 5 (ref 5–15)
BUN: 28 mg/dL — ABNORMAL HIGH (ref 8–23)
CO2: 31 mmol/L (ref 22–32)
Calcium: 9.7 mg/dL (ref 8.9–10.3)
Chloride: 103 mmol/L (ref 98–111)
Creatinine: 0.89 mg/dL (ref 0.44–1.00)
GFR, Estimated: >60 mL/min (ref >60)
Glucose, Bld: 94 mg/dL (ref 70–99)
Potassium: 4.3 mmol/L (ref 3.5–5.1)
Sodium: 139 mmol/L (ref 135–145)
Total Bilirubin: 0.4 mg/dL (ref 0.0–1.2)
Total Protein: 6.8 g/dL (ref 6.5–8.1)

## 2023-05-05 LAB — MAGNESIUM: Magnesium: 2.1 mg/dL (ref 1.7–2.4)

## 2023-05-05 NOTE — Progress Notes (Signed)
SURVIVORSHIP VISIT:  BRIEF ONCOLOGIC HISTORY:  Oncology History  Malignant neoplasm of upper-outer quadrant of right breast in female, estrogen receptor positive (HCC)  08/12/2021 Mammogram   Mammogram showed possible mass in the right breast.  Diagnostic mammogram showed 2 highly suspicious upper outer right breast masses measuring 1.5 cm in the 9:30 position and 1.4 cm at the 10 o'clock position.  These masses or distortions mammographically spanning a distance of up to 3.5 cm.  No abnormal appearing right axillary lymph nodes.   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  Prognostic showed ER 99% positive strong staining PR 10% positive moderate staining, HER2 positive 3+ and Ki-67 of 30%.   09/15/2021 Surgery   Right lumpectomy [facility]): IDC, grade 1, 3.6cm, margins +, 4 SLN negative.  Prior prognostic panel: ER 99%, PR 10%, HER2 +   09/15/2021 Cancer Staging   Staging form: Breast, AJCC 8th Edition - Pathologic stage from 09/15/2021: Stage IA (pT2, pN0, cM0, G1, ER+, PR+, HER2+) - Signed by Loa Socks, NP on 08/30/2022 Histologic grading system: 3 grade system   09/23/2021 Genetic Testing   Negative hereditary cancer genetic testing: no pathogenic variants detected in [facility] CustomNext-cancer +RNAinsight Panel.  Report date is September 23, 2021.   The CustomNext-Cancer+RNAinsight panel offered by [employer] [facility] includes sequencing and rearrangement analysis for the following 47 genes:  APC, ATM, AXIN2, BARD1, BMPR1A, BRCA1, BRCA2, BRIP1, CDH1, CDK4, CDKN2A, CHEK2, DICER1, EPCAM, GREM1, HOXB13, MEN1, MLH1, MSH2, MSH3, MSH6, MUTYH, NBN, NF1, NF2, NTHL1, PALB2, PMS2, POLD1, POLE, PTEN, RAD51C, RAD51D, RECQL, RET, SDHA, SDHAF2, SDHB, SDHC, SDHD, SMAD4, SMARCA4, STK11, TP53, TSC1, TSC2, and VHL.  RNA data is routinely analyzed for use in variant interpretation for all genes.   10/15/2021 Surgery   Reexcision cleared  margins.   11/10/2021 -  Chemotherapy   Patient is on Treatment Plan : BREAST Docetaxel + Carboplatin + Trastuzumab (TCH) q21d / Trastuzumab q21d     03/30/2022 - 04/28/2022 Radiation Therapy   Site Technique Total Dose (Gy) Dose per Fx (Gy) Completed Fx Beam Energies  Breast, Right: Breast_R 3D 42.56/42.56 2.66 16/16 6X, 10X  Breast, Right: Breast_R_Bst 3D 8/8 2 4/4 6X, 10X     05/2022 -  Anti-estrogen oral therapy   Anastrozole daily     INTERVAL HISTORY:  Rhonda Richardson to review her survivorship care plan detailing her treatment course for breast cancer, as well as monitoring long-term side effects of that treatment, education regarding health maintenance, screening, and overall wellness and health promotion.     Overall, Rhonda Richardson reports feeling quite well.  He is taking anastrozole daily with good tolerance.  REVIEW OF SYSTEMS:  Review of Systems  Constitutional:  Negative for appetite change, chills, fatigue, fever and unexpected weight change.  HENT:   Negative for hearing loss, lump/mass and trouble swallowing.   Eyes:  Negative for eye problems and icterus.  Respiratory:  Negative for chest tightness, cough and shortness of breath.   Cardiovascular:  Negative for chest pain, leg swelling and palpitations.  Gastrointestinal:  Negative for abdominal distention, abdominal pain, constipation, diarrhea, nausea and vomiting.  Endocrine: Negative for hot flashes.  Genitourinary:  Negative for difficulty urinating.   Musculoskeletal:  Negative for arthralgias.  Skin:  Negative for itching and rash.  Neurological:  Negative for dizziness, extremity weakness, headaches and numbness.  Hematological:  Negative for adenopathy. Does not bruise/bleed easily.  Psychiatric/Behavioral:  Negative for depression. The patient  is not nervous/anxious.    Breast: Denies any new nodularity, masses, tenderness, nipple changes, or nipple discharge.       PAST MEDICAL/SURGICAL HISTORY:  Past  Medical History:  Diagnosis Date   Anemia    Breast cancer (HCC)    GERD (gastroesophageal reflux disease)    Hypertension    Nonocclusive coronary atherosclerosis of native coronary artery 03/03/2023   Past Surgical History:  Procedure Laterality Date   BREAST LUMPECTOMY WITH RADIOACTIVE SEED AND SENTINEL LYMPH NODE BIOPSY Right 09/15/2021   Procedure: RIGHT BREAST SEED BRACKETED LUMPECTOMY AND SENTINEL NODE BIOPSY;  Surgeon: Almond Lint, MD;  Location: [facility];  Service: General;  Laterality: Right;   BREAST REDUCTION SURGERY Left 12/24/2022   Procedure: MAMMARY REDUCTION  (BREAST);  Surgeon: Glenna Fellows, MD;  Location: [facility];  Service: Plastics;  Laterality: Left;   cataract surgery Bilateral    CYSTOSCOPY WITH BIOPSY N/A 03/29/2023   Procedure: CYSTOSCOPY WITH BLADDER BIOPSY;  Surgeon: Noel Christmas, MD;  Location: [facility];  Service: Urology;  Laterality: N/A;  30 MINUTES   CYSTOSCOPY WITH FULGERATION N/A 03/29/2023   Procedure: CYSTOSCOPY WITH FULGERATION;  Surgeon: Noel Christmas, MD;  Location: [facility];  Service: Urology;  Laterality: N/A;   DILATION AND CURETTAGE OF UTERUS  1970   EXCISION OF BREAST LESION Right 10/15/2021   Procedure: ASPIRATION RIGHT AXILLARY SEROMA;  Surgeon: Almond Lint, MD;  Location: [facility];  Service: General;  Laterality: Right;   eyelid tendon repair Bilateral    MASTOPEXY Right 12/24/2022   Procedure: MASTOPEXY;  Surgeon: Glenna Fellows, MD;  Location: [facility];  Service: Plastics;  Laterality: Right;   PORT-A-CATH REMOVAL Left 12/24/2022   Procedure: REMOVAL CHEST PORT;  Surgeon: Glenna Fellows, MD;  Location: [facility];  Service: Plastics;  Laterality: Left;   PORTACATH PLACEMENT N/A 09/15/2021   Procedure: PORT PLACEMENT;  Surgeon: Almond Lint, MD;  Location:  SUR[facility]ERY CENTER;  Service: General;  Laterality: N/A;   RE-EXCISION OF BREAST LUMPECTOMY  Right 10/15/2021   Procedure: RE-EXCISION LUMPECTOMY RIGHT BREAST;  Surgeon: Almond Lint, MD;  [facility]: [facility];  Service: General;  Laterality: Right;     ALLERGIES:  Allergies  Allergen Reactions   Macrodantin [Nitrofurantoin] Hives     CURRENT MEDICATIONS:  Outpatient Encounter Medications as of 05/05/2023  Medication Sig   anastrozole (ARIMIDEX) 1 MG tablet Take 1 mg by mouth in the morning.   aspirin EC 81 MG tablet Take 1 tablet (81 mg total) by mouth daily. Swallow whole.   benazepril (LOTENSIN) 20 MG tablet Take 20 mg by mouth in the morning.   cholecalciferol (VITAMIN D3) 25 MCG (1000 UNIT) tablet Take 1,000 Units by mouth in the morning.   famotidine (PEPCID) 20 MG tablet Take 20 mg by mouth daily as needed for heartburn or indigestion.   hydrochlorothiazide (MICROZIDE) 12.5 MG capsule Take 12.5 mg by mouth in the morning.   magnesium oxide (MAG-OX) 400 (240 Mg) MG tablet Take 400 mg by mouth in the morning.   Multiple Vitamin (MULTIVITAMIN WITH MINERALS) TABS tablet Take 1 tablet by mouth in the morning.   rosuvastatin (CRESTOR) 20 MG tablet Take 1 tablet (20 mg total) by mouth daily.   Polysaccharide Iron Complex (FERREX 150 PO) Take 1 capsule by mouth every Monday, Wednesday, and Friday. (Patient not taking: Reported on 05/05/2023)   traMADol (ULTRAM) 50 MG tablet Take 1 tablet (50 mg total) by mouth every 6 (  six) hours as needed. (Patient not taking: Reported on 05/05/2023)   No facility-administered encounter medications on file as of 05/05/2023.     ONCOLOGIC FAMILY HISTORY:  Family History  Problem Relation Age of Onset   Heart disease Mother        Had phlebitis for several years.  Had a premenopausal coronary thrombosis at age 24.  Was on coumadin and other drugs.  Had mild strokes later and had angina.  Had coronary bypass surgery in May 13, 1979 and died in her sleep after rehab from a stroke in 1992-05-12.   Heart disease Father 69       Had congestive heart failure and died  of myocardial infarction at age 56.  He was a long time smoker.   Thyroid cancer Other 14       niece's daughter   Heart disease Brother        Had heart disease for several years and died at age 28 during a stress test.  Was obese.   Heart disease Brother        Had clotting issues, diagnosed heart disease and died at age 37.  Was a smoker and obese.     SOCIAL HISTORY:  Social History   Socioeconomic History   Marital status: Married    Spouse name: Not on file   Number of children: Not on file   Years of education: Not on file   Highest education level: Not on file  Occupational History   Not on file  Tobacco Use   Smoking status: Former    Types: Cigarettes   Smokeless tobacco: Not on file  Vaping Use   Vaping status: Never Used  Substance and Sexual Activity   Alcohol use: Not Currently   Drug use: Never   Sexual activity: Yes    Birth control/protection: Post-menopausal  Other Topics Concern   Not on file  Social History Narrative   Not on file   Social Drivers of Health   Financial Resource Strain: Low Risk  (09/09/2021)   Overall Financial Resource Strain (CARDIA)    Difficulty of Paying Living Expenses: Not hard at all  Food Insecurity: No Food Insecurity (09/09/2021)   Hunger Vital Sign    Worried About Running Out of Food in the Last Year: Never true    Ran Out of Food in the Last Year: Never true  Transportation Needs: No Transportation Needs (09/09/2021)   PRAPARE - Administrator, Civil Service (Medical): No    Lack of Transportation (Non-Medical): No  Physical Activity: Not on file  Stress: Not on file  Social Connections: Not on file  Intimate Partner Violence: Not on file     OBSERVATIONS/OBJECTIVE:  BP (!) 141/70 (BP Location: Left Arm, Patient Position: Sitting) Comment: NP notified  Pulse 77   Temp 97.6 F (36.4 C) (Temporal)   Resp 16   Ht 5\' 2"  (1.575 m)   Wt 143 lb 12.8 oz (65.2 kg)   SpO2 99%   BMI 26.30 kg/m   GENERAL: Patient is a well appearing female in no acute distress HEENT:  Sclerae anicteric.  Oropharynx clear and moist. No ulcerations or evidence of oropharyngeal candidiasis. Neck is supple.  NODES:  No cervical, supraclavicular, or axillary lymphadenopathy palpated.  BREAST EXAM: Right breast status postlumpectomy and radiation no sign of local recurrence left breast is benign. LUNGS:  Clear to auscultation bilaterally.  No wheezes or rhonchi. HEART:  Regular rate and rhythm. No murmur appreciated. ABDOMEN:  Soft, nontender.  Positive, normoactive bowel sounds. No organomegaly palpated. MSK:  No focal spinal tenderness to palpation. Full range of motion bilaterally in the upper extremities. EXTREMITIES:  No peripheral edema.   SKIN:  Clear with no obvious rashes or skin changes. No nail dyscrasia. NEURO:  Nonfocal. Well oriented.  Appropriate affect.   LABORATORY DATA:  None for this visit.  DIAGNOSTIC IMAGING:  None for this visit.      ASSESSMENT AND PLAN:  Ms.. Richardson is a pleasant 83 y.o. female with Stage 1A right breast invasive ductal carcinoma, ER+/PR+/HER2-, diagnosed in 08/2021, treated with lumpectomy, adjuvant chemotherapy, maintenance trastuzumab, adjuvant radiation therapy, and anti-estrogen therapy with anastrozole beginning in 05/2022.  She presents to the Survivorship Clinic for our initial meeting and routine follow-up post-completion of treatment for breast cancer.    1. Stage 1A right breast cancer:  Rhonda Richardson is continuing to recover from definitive treatment for breast cancer. She will follow-up with her medical oncologist, Dr.  Al Richardson in 6 months with history and physical exam per surveillance protocol.  She will continue her anti-estrogen therapy with Anastrozole. Thus far, she is tolerating the Anastrozole well, with minimal side effects. Her mammogram scheduled for 08/19/2023.   Today, a comprehensive survivorship care plan and treatment summary was reviewed  with the patient today detailing her breast cancer diagnosis, treatment course, potential late/long-term effects of treatment, appropriate follow-up care with recommendations for the future, and patient education resources.  A copy of this summary, along with a letter will be sent to the patient's primary care provider via mail/fax/In Basket message after today's visit.    2. Bone health:  Given Rhonda Richardson's age/history of breast cancer and her current treatment regimen including anti-estrogen therapy with Anastrozole, she is at risk for bone demineralization.  Her last DEXA scan was 10/12/2022, which showed osteoporosis with a t score of -2.7.  She sees her PCP Dr. Lewie Chamber for this.  Repeat is recommended in July 2026.  She was given education on specific activities to promote bone health.  3. Cancer screening:  Due to Rhonda Richardson history and her age, she should receive screening for skin cancers, colon cancer, and gynecologic cancers.  The information and recommendations are listed on the patient's comprehensive care plan/treatment summary and were reviewed in detail with the patient.    4. Health maintenance and wellness promotion: Rhonda Richardson was encouraged to consume 5-7 servings of fruits and vegetables per day. We reviewed the "Nutrition Rainbow" handout.  She was also encouraged to engage in moderate to vigorous exercise for 30 minutes per day most days of the week.  She was instructed to limit her alcohol consumption and continue to abstain from tobacco use.     5. Support services/counseling: It is not uncommon for this period of the patient's cancer care trajectory to be one of many emotions and stressors.   She was given information regarding our available services and encouraged to contact me with any questions or for help enrolling in any of our support group/programs.    Follow up instructions:    -Return to cancer center in 6 months for f/u with Dr. Al Richardson  -Mammogram due in 08/2023 -She  is welcome to return back to the Survivorship Clinic at any time; no additional follow-up needed at this time.  -Consider referral back to survivorship as a long-term survivor for continued surveillance  The patient was provided an opportunity to ask questions and all were answered. The patient agreed with the  plan and demonstrated an understanding of the instructions.   Total encounter time:45 minutes*in face-to-face visit time, chart review, lab review, care coordination, order entry, and documentation of the encounter time.    Lillard Anes, NP 05/05/23 10:52 AM Medical Oncology and Hematology [facility] [address] [place], [place] [address] Tel. [phone]    Fax. [phone]  *Total Encounter Time as defined by the Centers for Medicare and Medicaid Services includes, in addition to the face-to-face time of a patient visit (documented in the note above) non-face-to-face time: obtaining and reviewing outside history, ordering and reviewing medications, tests or procedures, care coordination (communications with other health care professionals or caregivers) and documentation in the medical record.

## 2023-05-09 ENCOUNTER — Ambulatory Visit: Payer: Medicare Other | Attending: General Surgery

## 2023-05-09 VITALS — Wt 147.1 lb

## 2023-05-09 DIAGNOSIS — Z483 Aftercare following surgery for neoplasm: Secondary | ICD-10-CM | POA: Insufficient documentation

## 2023-05-09 NOTE — Therapy (Signed)
OUTPATIENT PHYSICAL THERAPY SOZO SCREENING NOTE   Patient Name: Rhonda Richardson MRN: [id number] DOB:05/17/1940, 83 y.o., female Today's Date: 05/09/2023  PCP: Irven Coe, MD REFERRING PROVIDER: Almond Lint, MD   PT End of Session - 05/09/23 1121     Visit Number 4   # unchanged due to screen only   PT Start Time 1119    PT Stop Time 1123    PT Time Calculation (min) 4 min    Activity Tolerance Patient tolerated treatment well    Behavior During Therapy [facility] for tasks assessed/performed             Past Medical History:  Diagnosis Date   Anemia    Breast cancer (HCC)    GERD (gastroesophageal reflux disease)    Hypertension    Nonocclusive coronary atherosclerosis of native coronary artery 03/03/2023   Port-A-Cath in place 11/18/2021   Past Surgical History:  Procedure Laterality Date   BREAST LUMPECTOMY WITH RADIOACTIVE SEED AND SENTINEL LYMPH NODE BIOPSY Right 09/15/2021   Procedure: RIGHT BREAST SEED BRACKETED LUMPECTOMY AND SENTINEL NODE BIOPSY;  Surgeon: Almond Lint, MD;  Location: [facility];  Service: General;  Laterality: Right;   BREAST REDUCTION SURGERY Left 12/24/2022   Procedure: MAMMARY REDUCTION  (BREAST);  Surgeon: Glenna Fellows, MD;  Location: [facility];  Service: Plastics;  Laterality: Left;   cataract surgery Bilateral    CYSTOSCOPY WITH BIOPSY N/A 03/29/2023   Procedure: CYSTOSCOPY WITH BLADDER BIOPSY;  Surgeon: Noel Christmas, MD;  Location: [facility];  Service: Urology;  Laterality: N/A;  30 MINUTES   CYSTOSCOPY WITH FULGERATION N/A 03/29/2023   Procedure: CYSTOSCOPY WITH FULGERATION;  Surgeon: Noel Christmas, MD;  Location: [facility];  Service: Urology;  Laterality: N/A;   DILATION AND CURETTAGE OF UTERUS  1970   EXCISION OF BREAST LESION Right 10/15/2021   Procedure: ASPIRATION RIGHT AXILLARY SEROMA;  Surgeon: Almond Lint, MD;  Location: [facility];  Service: General;  Laterality: Right;   eyelid tendon repair  Bilateral    MASTOPEXY Right 12/24/2022   Procedure: MASTOPEXY;  Surgeon: Glenna Fellows, MD;  Location: [facility];  Service: Plastics;  Laterality: Right;   PORT-A-CATH REMOVAL Left 12/24/2022   Procedure: REMOVAL CHEST PORT;  Surgeon: Glenna Fellows, MD;  Location: [facility];  Service: Plastics;  Laterality: Left;   PORTACATH PLACEMENT N/A 09/15/2021   Procedure: PORT PLACEMENT;  Surgeon: Almond Lint, MD;  Location: [facility];  Service: General;  Laterality: N/A;   RE-EXCISION OF BREAST LUMPECTOMY Right 10/15/2021   Procedure: RE-EXCISION LUMPECTOMY RIGHT BREAST;  Surgeon: Almond Lint, MD;  Location: [facility];  Service: General;  Laterality: Right;   Patient Active Problem List   Diagnosis Date Noted   Nonocclusive coronary atherosclerosis of native coronary artery 03/03/2023   Family history of ischemic heart disease (IHD) 08/30/2022   History of dysplastic nevus 08/30/2022   Genetic testing 09/28/2021   Essential hypertension 09/09/2021   Malignant neoplasm of upper-outer quadrant of right breast in female, estrogen receptor positive (HCC) 09/08/2021   Gastroesophageal reflux disease 04/08/2016   Sensorineural hearing loss (SNHL), 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 She had a right lumpectomy and sentinel node biopsy (0/4 nodes positive) on 09/15/2021 followed by a re-excision on 10/15/2021. It  is triple positive with a Ki67 of 30%   PRECAUTIONS: right UE Lymphedema risk, None  SUBJECTIVE: Pt returns for her 3 month L-Dex screen.   PAIN:  Are you having pain? No  SOZO SCREENING: Patient was assessed today using the SOZO machine to determine the lymphedema index score. This was compared to her baseline score. It  was determined that she is within the recommended range when compared to her baseline and no further action is needed at this time. She will continue SOZO screenings. These are done every 3 months for 2 years post operatively followed by every 6 months for 2 years, and then annually.   L-DEX FLOWSHEETS - 05/09/23 1100       L-DEX LYMPHEDEMA SCREENING   Measurement Type Unilateral    L-DEX MEASUREMENT EXTREMITY Upper Extremity    POSITION  Standing    DOMINANT SIDE Right    At Risk Side Right    BASELINE SCORE (UNILATERAL) -1.9    L-DEX SCORE (UNILATERAL) -3.5    VALUE CHANGE (UNILAT) -1.6               Hermenia Bers, PTA 05/09/2023, 11:22 AM

## 2023-05-16 ENCOUNTER — Other Ambulatory Visit: Payer: Self-pay | Admitting: Hematology and Oncology

## 2023-05-19 ENCOUNTER — Telehealth: Payer: Self-pay | Admitting: Pharmacist

## 2023-05-19 NOTE — Progress Notes (Signed)
   05/19/2023  Patient ID: Rhonda Richardson, female   DOB: [birth year], 83 y.o.   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 Advised alpha lipoic acid + biotin is preferred over B12 based on good CBC results. Discussed that biotin affects TSH lab, but hasn't had this checked since 2018.  For Prolia, we discussed this in July 2024. Note said $0 copay and would discuss at our next call the got cancelled due to [facility] closing. Message sent to PCP to get this started with a referral to Endo. Would prefer this over re-starting Alendronate.   Aloysius Breeding, PharmD [facility] Phone Number: [phone]

## 2023-06-02 DIAGNOSIS — R202 Paresthesia of skin: Secondary | ICD-10-CM | POA: Diagnosis not present

## 2023-06-02 DIAGNOSIS — M5030 Other cervical disc degeneration, unspecified cervical region: Secondary | ICD-10-CM | POA: Diagnosis not present

## 2023-06-30 DIAGNOSIS — M76821 Posterior tibial tendinitis, right leg: Secondary | ICD-10-CM | POA: Diagnosis not present

## 2023-07-13 DIAGNOSIS — Z779 Other contact with and (suspected) exposures hazardous to health: Secondary | ICD-10-CM | POA: Diagnosis not present

## 2023-07-13 DIAGNOSIS — Z01419 Encounter for gynecological examination (general) (routine) without abnormal findings: Secondary | ICD-10-CM | POA: Diagnosis not present

## 2023-07-13 DIAGNOSIS — Z124 Encounter for screening for malignant neoplasm of cervix: Secondary | ICD-10-CM | POA: Diagnosis not present

## 2023-07-13 DIAGNOSIS — Z01411 Encounter for gynecological examination (general) (routine) with abnormal findings: Secondary | ICD-10-CM | POA: Diagnosis not present

## 2023-08-03 DIAGNOSIS — M76821 Posterior tibial tendinitis, right leg: Secondary | ICD-10-CM | POA: Diagnosis not present

## 2023-08-08 ENCOUNTER — Ambulatory Visit: Payer: Medicare Other | Attending: General Surgery

## 2023-08-08 VITALS — Wt 151.1 lb

## 2023-08-08 DIAGNOSIS — Z483 Aftercare following surgery for neoplasm: Secondary | ICD-10-CM | POA: Insufficient documentation

## 2023-08-08 NOTE — Therapy (Signed)
 OUTPATIENT PHYSICAL THERAPY SOZO SCREENING NOTE   Patient Name: Rhonda Richardson MRN: [id number] DOB:May 20, 1940, 83 y.o., female Today's Date: 08/08/2023  PCP: Benedetto Brady, MD REFERRING PROVIDER: Lockie Rima, MD   PT End of Session - 08/08/23 1555     Visit Number 4   # unchanged due to screen only   PT Start Time 1553    PT Stop Time 1557    PT Time Calculation (min) 4 min    Activity Tolerance Patient tolerated treatment well    Behavior During Therapy [facility] for tasks assessed/performed             Past Medical History:  Diagnosis Date   Anemia    Breast cancer (HCC)    GERD (gastroesophageal reflux disease)    Hypertension    Nonocclusive coronary atherosclerosis of native coronary artery 03/03/2023   Port-A-Cath in place 11/18/2021   Past Surgical History:  Procedure Laterality Date   BREAST LUMPECTOMY WITH RADIOACTIVE SEED AND SENTINEL LYMPH NODE BIOPSY Right 09/15/2021   Procedure: RIGHT BREAST SEED BRACKETED LUMPECTOMY AND SENTINEL NODE BIOPSY;  Surgeon: Lockie Rima, MD;  Location: [facility];  Service: General;  Laterality: Right;   BREAST REDUCTION SURGERY Left 12/24/2022   Procedure: MAMMARY REDUCTION  (BREAST);  Surgeon: Alger Infield, MD;  Location: [facility];  Service: Plastics;  Laterality: Left;   cataract surgery Bilateral    CYSTOSCOPY WITH BIOPSY N/A 03/29/2023   Procedure: CYSTOSCOPY WITH BLADDER BIOPSY;  Surgeon: Roxane Copp, MD;  Location: [facility];  Service: Urology;  Laterality: N/A;  30 MINUTES   CYSTOSCOPY WITH FULGERATION N/A 03/29/2023   Procedure: CYSTOSCOPY WITH FULGERATION;  Surgeon: Roxane Copp, MD;  Location: [facility];  Service: Urology;  Laterality: N/A;   DILATION AND CURETTAGE OF UTERUS  1970   EXCISION OF BREAST LESION Right 10/15/2021   Procedure: ASPIRATION RIGHT AXILLARY SEROMA;  Surgeon: Lockie Rima, MD;  Location: [facility];  Service: General;  Laterality: Right;   eyelid tendon repair  Bilateral    MASTOPEXY Right 12/24/2022   Procedure: MASTOPEXY;  Surgeon: Alger Infield, MD;  Location: [facility];  Service: Plastics;  Laterality: Right;   PORT-A-CATH REMOVAL Left 12/24/2022   Procedure: REMOVAL CHEST PORT;  Surgeon: Alger Infield, MD;  Location: [facility];  Service: Plastics;  Laterality: Left;   PORTACATH PLACEMENT N/A 09/15/2021   Procedure: PORT PLACEMENT;  Surgeon: Lockie Rima, MD;  Location: [facility];  Service: General;  Laterality: N/A;   RE-EXCISION OF BREAST LUMPECTOMY Right 10/15/2021   Procedure: RE-EXCISION LUMPECTOMY RIGHT BREAST;  Surgeon: Lockie Rima, MD;  Location: [facility];  Service: General;  Laterality: Right;   Patient Active Problem List   Diagnosis Date Noted   Nonocclusive coronary atherosclerosis of native coronary artery 03/03/2023   Family history of ischemic heart disease (IHD) 08/30/2022   History of dysplastic nevus 08/30/2022   Genetic testing 09/28/2021   Essential hypertension 09/09/2021   Malignant neoplasm of upper-outer quadrant of right breast in female, estrogen receptor positive (HCC) 09/08/2021   Gastroesophageal reflux disease 04/08/2016   Sensorineural hearing loss (SNHL), 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 She had a right lumpectomy and sentinel node biopsy (0/4 nodes positive) on 09/15/2021 followed by a re-excision on 10/15/2021. It  is triple positive with a Ki67 of 30%   PRECAUTIONS: right UE Lymphedema risk, None  SUBJECTIVE: Pt returns for her 3 month L-Dex screen.   PAIN:  Are you having pain? No  SOZO SCREENING: Patient was assessed today using the SOZO machine to determine the lymphedema index score. This was compared to her baseline score. It  was determined that she is within the recommended range when compared to her baseline and no further action is needed at this time. She will continue SOZO screenings. These are done every 3 months for 2 years post operatively followed by every 6 months for 2 years, and then annually.   L-DEX FLOWSHEETS - 08/08/23 1500       L-DEX LYMPHEDEMA SCREENING   Measurement Type Unilateral    L-DEX MEASUREMENT EXTREMITY Upper Extremity    POSITION  Standing    DOMINANT SIDE Right    At Risk Side Right    BASELINE SCORE (UNILATERAL) -1.9    L-DEX SCORE (UNILATERAL) -4.2    VALUE CHANGE (UNILAT) -2.3               Denyce Flank, PTA 08/08/2023, 3:57 PM

## 2023-08-17 ENCOUNTER — Encounter (HOSPITAL_COMMUNITY): Payer: Self-pay

## 2023-08-18 ENCOUNTER — Other Ambulatory Visit: Payer: Self-pay | Admitting: Hematology and Oncology

## 2023-08-18 DIAGNOSIS — Z9889 Other specified postprocedural states: Secondary | ICD-10-CM

## 2023-08-24 ENCOUNTER — Ambulatory Visit
Admission: RE | Admit: 2023-08-24 | Discharge: 2023-08-24 | Disposition: A | Discharge status: Home / Self Care | Payer: Medicare Other | Source: Ambulatory Visit | Attending: Hematology and Oncology | Admitting: Hematology and Oncology

## 2023-08-24 DIAGNOSIS — Z853 Personal history of malignant neoplasm of breast: Secondary | ICD-10-CM | POA: Diagnosis not present

## 2023-08-24 DIAGNOSIS — Z9889 Other specified postprocedural states: Secondary | ICD-10-CM

## 2023-08-24 DIAGNOSIS — Z08 Encounter for follow-up examination after completed treatment for malignant neoplasm: Secondary | ICD-10-CM | POA: Diagnosis not present

## 2023-08-25 DIAGNOSIS — M810 Age-related osteoporosis without current pathological fracture: Secondary | ICD-10-CM | POA: Diagnosis not present

## 2023-08-25 DIAGNOSIS — I10 Essential (primary) hypertension: Secondary | ICD-10-CM | POA: Diagnosis not present

## 2023-08-25 DIAGNOSIS — E7800 Pure hypercholesterolemia, unspecified: Secondary | ICD-10-CM | POA: Diagnosis not present

## 2023-08-28 DIAGNOSIS — R051 Acute cough: Secondary | ICD-10-CM | POA: Diagnosis not present

## 2023-08-28 DIAGNOSIS — H1031 Unspecified acute conjunctivitis, right eye: Secondary | ICD-10-CM | POA: Diagnosis not present

## 2023-08-30 DIAGNOSIS — Z853 Personal history of malignant neoplasm of breast: Secondary | ICD-10-CM | POA: Diagnosis not present

## 2023-08-30 DIAGNOSIS — D2230 Melanocytic nevi of unspecified part of face: Secondary | ICD-10-CM | POA: Diagnosis not present

## 2023-08-30 DIAGNOSIS — L814 Other melanin hyperpigmentation: Secondary | ICD-10-CM | POA: Diagnosis not present

## 2023-08-30 DIAGNOSIS — L821 Other seborrheic keratosis: Secondary | ICD-10-CM | POA: Diagnosis not present

## 2023-08-30 DIAGNOSIS — D225 Melanocytic nevi of trunk: Secondary | ICD-10-CM | POA: Diagnosis not present

## 2023-08-30 DIAGNOSIS — Z86018 Personal history of other benign neoplasm: Secondary | ICD-10-CM | POA: Diagnosis not present

## 2023-08-31 ENCOUNTER — Other Ambulatory Visit: Payer: Self-pay | Admitting: Family Medicine

## 2023-08-31 ENCOUNTER — Ambulatory Visit
Admission: RE | Admit: 2023-08-31 | Discharge: 2023-08-31 | Disposition: A | Discharge status: Home / Self Care | Source: Ambulatory Visit | Attending: Family Medicine | Admitting: Family Medicine

## 2023-08-31 DIAGNOSIS — N179 Acute kidney failure, unspecified: Secondary | ICD-10-CM | POA: Diagnosis not present

## 2023-08-31 DIAGNOSIS — K219 Gastro-esophageal reflux disease without esophagitis: Secondary | ICD-10-CM | POA: Diagnosis not present

## 2023-08-31 DIAGNOSIS — H1031 Unspecified acute conjunctivitis, right eye: Secondary | ICD-10-CM | POA: Diagnosis not present

## 2023-08-31 DIAGNOSIS — C50911 Malignant neoplasm of unspecified site of right female breast: Secondary | ICD-10-CM | POA: Diagnosis not present

## 2023-08-31 DIAGNOSIS — R051 Acute cough: Secondary | ICD-10-CM | POA: Diagnosis not present

## 2023-08-31 DIAGNOSIS — R059 Cough, unspecified: Secondary | ICD-10-CM | POA: Diagnosis not present

## 2023-08-31 DIAGNOSIS — I10 Essential (primary) hypertension: Secondary | ICD-10-CM | POA: Diagnosis not present

## 2023-08-31 DIAGNOSIS — Z8249 Family history of ischemic heart disease and other diseases of the circulatory system: Secondary | ICD-10-CM | POA: Diagnosis not present

## 2023-08-31 DIAGNOSIS — E7800 Pure hypercholesterolemia, unspecified: Secondary | ICD-10-CM | POA: Diagnosis not present

## 2023-08-31 DIAGNOSIS — M810 Age-related osteoporosis without current pathological fracture: Secondary | ICD-10-CM | POA: Diagnosis not present

## 2023-09-02 ENCOUNTER — Ambulatory Visit (HOSPITAL_BASED_OUTPATIENT_CLINIC_OR_DEPARTMENT_OTHER): Payer: Medicare Other | Admitting: Family

## 2023-09-02 ENCOUNTER — Encounter (HOSPITAL_BASED_OUTPATIENT_CLINIC_OR_DEPARTMENT_OTHER): Payer: Self-pay | Admitting: Family

## 2023-09-02 VITALS — BP 116/62 | HR 98 | Ht 62.0 in | Wt 141.0 lb

## 2023-09-02 DIAGNOSIS — E785 Hyperlipidemia, unspecified: Secondary | ICD-10-CM

## 2023-09-02 DIAGNOSIS — I25118 Atherosclerotic heart disease of native coronary artery with other forms of angina pectoris: Secondary | ICD-10-CM

## 2023-09-02 NOTE — Progress Notes (Signed)
 Cardiology Office Note:  .   Date:  09/02/2023  ID:  Rhonda Richardson, DOB 09-09-1940, MRN [id number] PCP: Benedetto Brady, MD  [facility] Providers Cardiologist:  Sheryle Donning, MD    History of Present Illness: .   Rhonda Richardson is a 83 y.o. female with history of breast cancer, nonobstructive CAD, family history of heart disease.   Discussed the use of AI scribe software for clinical note transcription with the patient, who gave verbal consent to proceed.  History of Present Illness Rhonda Richardson is an 83 year old female with coronary artery disease who presents for a cardiovascular follow-up.  She has been taking her aspirin  and rosuvastatin  routinely.  She does note some occasional cramps within her fingers, including a trigger thumb, and occasionally in her legs. She questions whether rosuvastatin  might be causing her symptoms.  Symptoms are very intermittent discussed would be a typical side effect of statin.  Did discuss possible 2-week statin holiday.  Recent labs 08/25/2023 with PCP LDL 66.  She reports swelling in her ankles, which an orthopedic specialist suggested could be managed with compression and better arch support. She works part-time at [employer] and stands for long periods, which she believes contributes to her ankle issues.  ROS: Please see the history of present illness.    All other systems reviewed and are negative.   Studies Reviewed: .        Cardiac Studies & Procedures   ______________________________________________________________________________________________     ECHOCARDIOGRAM  ECHOCARDIOGRAM COMPLETE 11/30/2022  Narrative ECHOCARDIOGRAM REPORT    Patient Name:   Rhonda Richardson Date of Exam: 11/30/2022 Medical Rec #:  [id number]        Height:       62.0 in Accession #:    [id number]       Weight:       140.6 lb Date of Birth:  [birth year]       BSA:          1.646 m Patient Age:    66 years         BP:            168/77 mmHg Patient Gender: F                HR:           71 bpm. Exam Location:  Outpatient  Procedure: 2D Echo, 3D Echo, Color Doppler, Cardiac Doppler and Strain Analysis  Indications:    Congestive Heart Failure  History:        Patient has prior history of Echocardiogram examinations, most recent 08/18/2022. Risk Factors:Hypertension and Breast Cancer.  Sonographer:    Travis Friedman RDCS Referring Phys: [id number] DANIEL R BENSIMHON   Sonographer Comments: Image acquisition challenging due to respiratory motion. Global longitudinal strain was attempted. IMPRESSIONS   1. GLS mildly abnormal -16.2% this echo, which has improved compared with -14.6% 06/2022. Left ventricular ejection fraction, by estimation, is 55 to 60%. The left ventricle has normal function. The left ventricle has no regional wall motion abnormalities. Left ventricular diastolic parameters are consistent with Grade I diastolic dysfunction (impaired relaxation). The average left ventricular global longitudinal strain is -16.2 %. 2. Right ventricular systolic function is normal. The right ventricular size is normal. There is normal pulmonary artery systolic pressure. 3. The mitral valve is normal in structure. Mild mitral valve regurgitation. No evidence of mitral stenosis. 4. Tricuspid valve regurgitation is mild to moderate. 5. The aortic valve  is tricuspid. There is mild calcification of the aortic valve. There is mild thickening of the aortic valve. Aortic valve regurgitation is not visualized. No aortic stenosis is present. 6. The inferior vena cava is normal in size with greater than 50% respiratory variability, suggesting right atrial pressure of 3 mmHg.  FINDINGS Left Ventricle: GLS mildly abnormal -16.2% this echo, which has improved compared with -14.6% 06/2022. Left ventricular ejection fraction, by estimation, is 55 to 60%. The left ventricle has normal function. The left ventricle has no regional wall  motion abnormalities. The average left ventricular global longitudinal strain is -16.2 %. The left ventricular internal cavity size was normal in size. There is no left ventricular hypertrophy. Left ventricular diastolic parameters are consistent with Grade I diastolic dysfunction (impaired relaxation). Normal left ventricular filling pressure.  Right Ventricle: The right ventricular size is normal. No increase in right ventricular wall thickness. Right ventricular systolic function is normal. There is normal pulmonary artery systolic pressure. The tricuspid regurgitant velocity is 2.18 m/s, and with an assumed right atrial pressure of 3 mmHg, the estimated right ventricular systolic pressure is 22.0 mmHg.  Left Atrium: Left atrial size was normal in size.  Right Atrium: Right atrial size was normal in size.  Pericardium: There is no evidence of pericardial effusion.  Mitral Valve: The mitral valve is normal in structure. Mild mitral valve regurgitation, with posteriorly-directed jet. No evidence of mitral valve stenosis.  Tricuspid Valve: The tricuspid valve is normal in structure. Tricuspid valve regurgitation is mild to moderate. No evidence of tricuspid stenosis.  Aortic Valve: The aortic valve is tricuspid. There is mild calcification of the aortic valve. There is mild thickening of the aortic valve. Aortic valve regurgitation is not visualized. No aortic stenosis is present. Aortic valve peak gradient measures 4.5 mmHg.  Pulmonic Valve: The pulmonic valve was normal in structure. Pulmonic valve regurgitation is mild to moderate. No evidence of pulmonic stenosis.  Aorta: The aortic root is normal in size and structure.  Venous: The inferior vena cava is normal in size with greater than 50% respiratory variability, suggesting right atrial pressure of 3 mmHg.  IAS/Shunts: No atrial level shunt detected by color flow Doppler.   LEFT VENTRICLE PLAX 2D LVIDd:         3.90 cm    Diastology LVIDs:         2.70 cm   LV e' medial:    6.09 cm/s LV PW:         0.90 cm   LV E/e' medial:  8.3 LV IVS:        0.90 cm   LV e' lateral:   8.59 cm/s LVOT diam:     2.00 cm   LV E/e' lateral: 5.9 LV SV:         54 LV SV Index:   33        2D Longitudinal Strain LVOT Area:     3.14 cm  2D Strain GLS (A2C):   -15.6 % 2D Strain GLS (A3C):   -16.0 % 2D Strain GLS (A4C):   -16.9 % 2D Strain GLS Avg:     -16.2 %  3D Volume EF: 3D EF:        60 % LV EDV:       77 ml LV ESV:       30 ml LV SV:        46 ml  RIGHT VENTRICLE  IVC RV Basal diam:  2.50 cm     IVC diam: 1.90 cm RV S prime:     12.70 cm/s TAPSE (M-mode): 2.2 cm  LEFT ATRIUM             Index        RIGHT ATRIUM           Index LA diam:        4.00 cm 2.43 cm/m   RA Area:     12.60 cm LA Vol (A2C):   38.3 ml 23.27 ml/m  RA Volume:   26.20 ml  15.92 ml/m LA Vol (A4C):   44.3 ml 26.92 ml/m LA Biplane Vol: 42.3 ml 25.70 ml/m AORTIC VALVE AV Area (Vmax): 2.45 cm AV Vmax:        106.00 cm/s AV Peak Grad:   4.5 mmHg LVOT Vmax:      82.80 cm/s LVOT Vmean:     55.500 cm/s LVOT VTI:       0.173 m  AORTA Ao Root diam: 2.80 cm Ao Asc diam:  3.70 cm  MITRAL VALVE               TRICUSPID VALVE MV Area (PHT): 3.31 cm    TR Peak grad:   19.0 mmHg MV Decel Time: 229 msec    TR Vmax:        218.00 cm/s MV E velocity: 50.80 cm/s MV A velocity: 59.20 cm/s  SHUNTS MV E/A ratio:  0.86        Systemic VTI:  0.17 m Systemic Diam: 2.00 cm  Maudine Sos MD Electronically signed by Maudine Sos MD Signature Date/Time: 11/30/2022/12:49:30 PM    Final      CT SCANS  CT CORONARY MORPH W/CTA COR W/SCORE 12/09/2022  Addendum 12/18/2022 12:08 PM ADDENDUM REPORT: 12/18/2022 12:06  EXAM: OVER-READ INTERPRETATION  CT CHEST  The following report is an over-read performed by radiologist Dr. Chadwick Colonel of [facility] on 12/18/2022. This over-read does not include interpretation of  cardiac or coronary anatomy or pathology. The coronary CTA interpretation by the cardiologist is attached.  COMPARISON:  Coronary calcium  score 09/01/2022  FINDINGS: Vascular: Aortic atherosclerosis. The included aorta is normal in caliber.  Mediastinum/nodes: No adenopathy or mass. Unremarkable esophagus.  Lungs: No focal airspace disease. No pulmonary nodule. No pleural fluid. The included airways are patent.  Upper abdomen: No acute findings. Multiple cysts in the liver, previously characterized on MRI. 9 mm calcified splenic artery aneurysm.  Musculoskeletal: There are no acute or suspicious osseous abnormalities. Multiple right breast surgical clips.  IMPRESSION: 1. A 9 mm calcified splenic artery aneurysm. Given size, this needs no further imaging follow-up. 2. 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 3.  Aortic Atherosclerosis (ICD10-I70.0).   Electronically Signed By: Chadwick Colonel M.D. On: 12/18/2022 12:06  Narrative HISTORY: CAD screening, high CAD risk, not treadmill candidate CA score 987  EXAM: Cardiac/Coronary CT  TECHNIQUE: The patient was scanned on a [employer].  PROTOCOL: A 120 kV prospective scan was triggered in the descending thoracic aorta at 111 HU's. Axial non-contrast 3 mm slices were carried out through the heart. The data set was analyzed on a dedicated work station and scored using the Agatston method. Gantry rotation speed was 250 msecs and collimation was 0.6 mm. Heart rate was optimized medically and sl NTG was given. The 3D data set was reconstructed in 5% intervals of the 35-75 % of the R-R cycle. Systolic and  diastolic phases were analyzed on a dedicated work station using MPR, MIP and VRT modes. The patient received 100mL OMNIPAQUE  IOHEXOL  350 MG/ML SOLN of contrast.  FINDINGS: Coronary calcium  score: The patient's coronary artery calcium  score is 916, which places the patient in the 90  percentile.  Coronary arteries: Normal coronary origins.  Right dominance.  Right Coronary Artery: Normal caliber vessel, gives rise to PDA. Diffuse mixed calcified and noncalcified plaque with maximum 25-49% stenosis in the mid vessel.  Left Main Coronary Artery: Normal caliber vessel. Mixed calcified and noncalcified plaque with 1-24% stenosis.  Left Anterior Descending Coronary Artery: Normal caliber vessel. Diffuse mixed calcified and noncalcified plaque throughout the proximal and mid portion, with 25-49% stenosis proximally. There is a portion of the mid LAD concerning for 50-69% stenosis, though there is step artifact in this region that limits interpretation. Gives rise to large first diagonal branch.  Left Circumflex Artery: Normal caliber vessel. Diffuse mixed calcified and noncalcified plaque throughout the proximal and mid portion, with 25-49% stenosis proximally. There is a portion of the mid LCx at the focus of artifact concerning for 50-69% stenosis. Gives rise to small first, large second OM branches.  Aorta: Normal size, 37 mm at the mid ascending aorta (level of the PA bifurcation) measured double oblique. Aortic atherosclerosis. No dissection seen in visualized portions of the aorta.  Aortic Valve: trivial calcifications. Trileaflet.  Other findings:  Normal pulmonary vein drainage into the left atrium.  Normal left atrial appendage without a thrombus.  Normal size of the pulmonary artery.  Normal appearance of the pericardium.  IMPRESSION: 1. Moderate CAD, CADRADS = 3. There is a portion of both the mid LAD and mid LCx concerning for 50-69% stenosis, though there is step artifact in this region that limits interpretation. CT FFR will be performed and reported separately.  2. Coronary calcium  score of 916. This was 90th percentile for age-, sex-, and race- matched controls.  3. Total plaque volume 781 mm3 which is 80th percentile for age- and sex-  matched controls (calcified plaque 169 mm3; noncalcified plaque 612 mm3). Total plaque volume is extensive.  4. Normal coronary origin with right dominance.  5. Aortic atherosclerosis.  INTERPRETATION:  CAD-RADS 3: Moderate stenosis (50-69%). Consider symptom-guided anti-ischemic pharmacotherapy as well as risk factor modification per guideline directed care. Additional analysis with CT FFR will be submitted.  Electronically Signed: By: Sheryle Donning M.D. On: 12/09/2022 22:31   CT SCANS  CT CARDIAC SCORING (SELF PAY ONLY) 09/01/2022  Addendum 09/01/2022 11:15 PM ADDENDUM REPORT: 09/01/2022 23:13  EXAM: OVER-READ INTERPRETATION  CT CHEST  The following report is an over-read performed by radiologist Dr. Kriss Peter [facility] on 09/01/2022. This over-read does not include interpretation of cardiac or coronary anatomy or pathology. The coronary calcium  score interpretation by the cardiologist is attached.  COMPARISON:  None.  FINDINGS: Right middle lobe linear scarring. The visualized lungs are otherwise clear. Multiple hypodense lesions in the liver, not characterized on this CT. Further characterization with MRI without and with contrast recommended. The tip of a Port-A-Cath is noted in the central SVC. Multiple surgical clips in the right breast.  IMPRESSION: Multiple hypodense lesions in the liver, not characterized on this CT. Further characterization with MRI without and with contrast recommended.   Electronically Signed By: Angus Bark M.D. On: 09/01/2022 23:13  Narrative CLINICAL DATA:  Cardiovascular Disease Risk stratification  EXAM: Coronary Calcium  Score  TECHNIQUE: A gated, non-contrast computed tomography scan of the heart was  performed using 3 mm slice thickness. Axial images were analyzed on a dedicated workstation. Calcium  scoring of the coronary arteries was performed using the Agatston  method.  FINDINGS: Coronary arteries: Normal origins.  Coronary Calcium  Score:  Left main: 0  Left anterior descending artery: 320  Left circumflex artery: 549  Right coronary artery: 119  Total: 987  Percentile: 91st  Pericardium: Normal.  Aorta: Borderline dilated at 39 mm, level of the main PA bifurcation (non-contrast). Aortic atherosclerosis.  Non-cardiac: See separate report from [facility].  IMPRESSION: 1. Coronary calcium  score of 987. This was 91st percentile for age-, race-, and sex-matched controls. 2. Aorta: Borderline dilated at 39 mm, level of the main PA bifurcation (non-contrast). Aortic atherosclerosis.  RECOMMENDATIONS: Coronary artery calcium  (CAC) score is a strong predictor of incident coronary heart disease (CHD) and provides predictive information beyond traditional risk factors. CAC scoring is reasonable to use in the decision to withhold, postpone, or initiate statin therapy in intermediate-risk or selected borderline-risk asymptomatic adults (age 44-75 years and LDL-C >=70 to <190 mg/dL) who do not have diabetes or established atherosclerotic cardiovascular disease (ASCVD).* In intermediate-risk (10-year ASCVD risk >=7.5% to <20%) adults or selected borderline-risk (10-year ASCVD risk >=5% to <7.5%) adults in whom a CAC score is measured for the purpose of making a treatment decision the following recommendations have been made:  If CAC=0, it is reasonable to withhold statin therapy and reassess in 5 to 10 years, as long as higher risk conditions are absent (diabetes mellitus, family history of premature CHD in first degree relatives (males <55 years; females <65 years), cigarette smoking, or LDL >=190 mg/dL).  If CAC is 1 to 99, it is reasonable to initiate statin therapy for patients >=8 years of age.  If CAC is >=100 or >=75th percentile, it is reasonable to initiate statin therapy at any age.  Cardiology referral  should be considered for patients with CAC scores >=400 or >=75th percentile.  *2018 AHA/ACC/AACVPR/AAPA/ABC/ACPM/ADA/AGS/APhA/ASPC/NLA/PCNA Guideline on the Management of Blood Cholesterol: A Report of the American College of Cardiology/American Heart Association Task Force on Clinical Practice Guidelines. J Am Coll Cardiol. 2019;73(24):3168-3209.  Dinah Franco, MD  Electronically Signed: By: Hazle Lites M.D. On: 09/01/2022 21:01     ______________________________________________________________________________________________      Risk Assessment/Calculations:             Physical Exam:   VS:  BP 116/62   Pulse 98   Ht 5\' 2"  (1.575 m)   Wt 141 lb (64 kg)   SpO2 97%   BMI 25.79 kg/m    Wt Readings from Last 3 Encounters:  09/02/23 141 lb (64 kg)  08/08/23 151 lb 2 oz (68.5 kg)  05/09/23 147 lb 2 oz (66.7 kg)    GEN: Well nourished, well developed in no acute distress NECK: No JVD; No carotid bruits CARDIAC: RRR, no murmurs, rubs, gallops RESPIRATORY:  Clear to auscultation without rales, wheezing or rhonchi  ABDOMEN: Soft, non-tender, non-distended EXTREMITIES:  No edema; No deformity   ASSESSMENT AND PLAN: .    Assessment & Plan CAD/hyperlipidemia, LDL goal less than 70 LDL cholesterol controlled at 66 mg/dL. She reports intermittent cramping, discussed unlikely related to Rosuvastatin  but could trial holding for 2 weeks if she wanted. Discussed higher statin doses offer more cardio-protection. - Continue rosuvastatin  20 mg daily. - Consider holding rosuvastatin  for two weeks if cramping worsens. - Discuss potential switch to pravastatin if symptoms improve after statin holiday. -Recommend aiming for 150 minutes of moderate  intensity activity per week and following a heart healthy diet.    Swelling of ankles Swelling attributed to compression needs and lack of arch support. Orthopedic evaluation suggested compression and better arch support. Some element  venous insufficiency also likely. - Consider visiting [employer] for shoes with better arch support. - Recommend leg elevations, compression stockings.           Dispo: follow up in 9 months with Dr. Veryl Gottron  Signed, Clearnce Curia, NP

## 2023-09-02 NOTE — Patient Instructions (Signed)
 Medication Instructions:  Continue your current medications.  If you want to hold your statin for two weeks to see if it improves your cramps you may. If you do this, please let us  know if you notice any improvement.   *If you need a refill on your cardiac medications before your next appointment, please call your pharmacy*  Lab Work: Your cholesterol looked great when checked May 15th!  Follow-Up: At [facility], you and your health needs are our priority.  As part of our continuing mission to provide you with exceptional heart care, our providers are all part of one team.  This team includes your primary Cardiologist (physician) and Advanced Practice Providers or APPs (Physician Assistants and Nurse Practitioners) who all work together to provide you with the care you need, when you need it.  Your next appointment:   9 month(s)  Provider:   Sheryle Donning, MD, Slater Duncan, NP, or Neomi Banks, NP    We recommend signing up for the patient portal called "MyChart".  Sign up information is provided on this After Visit Summary.  MyChart is used to connect with patients for Virtual Visits (Telemedicine).  Patients are able to view lab/test results, encounter notes, upcoming appointments, etc.  Non-urgent messages can be sent to your provider as well.   To learn more about what you can do with MyChart, go to [email or web address].   Other Instructions  Heart Healthy Diet Recommendations: A low-salt diet is recommended. Meats should be grilled, baked, or boiled. Avoid fried foods. Focus on lean protein sources like fish or chicken with vegetables and fruits. The American Heart Association is a Chief Technology Officer!  American Heart Association Diet and Lifeystyle Recommendations   Exercise recommendations: The American Heart Association recommends 150 minutes of moderate intensity exercise weekly. Try 30 minutes of moderate intensity exercise 4-5 times per week. This could  include walking, jogging, or swimming.

## 2023-10-05 ENCOUNTER — Other Ambulatory Visit (HOSPITAL_COMMUNITY): Payer: Self-pay | Admitting: Cardiology

## 2023-10-05 DIAGNOSIS — N179 Acute kidney failure, unspecified: Secondary | ICD-10-CM | POA: Diagnosis not present

## 2023-10-10 DIAGNOSIS — R2681 Unsteadiness on feet: Secondary | ICD-10-CM | POA: Diagnosis not present

## 2023-10-17 DIAGNOSIS — R2681 Unsteadiness on feet: Secondary | ICD-10-CM | POA: Diagnosis not present

## 2023-10-19 DIAGNOSIS — R2681 Unsteadiness on feet: Secondary | ICD-10-CM | POA: Diagnosis not present

## 2023-10-24 DIAGNOSIS — R2681 Unsteadiness on feet: Secondary | ICD-10-CM | POA: Diagnosis not present

## 2023-10-26 DIAGNOSIS — R2681 Unsteadiness on feet: Secondary | ICD-10-CM | POA: Diagnosis not present

## 2023-11-02 ENCOUNTER — Telehealth: Payer: Self-pay

## 2023-11-02 DIAGNOSIS — R2681 Unsteadiness on feet: Secondary | ICD-10-CM | POA: Diagnosis not present

## 2023-11-02 NOTE — Telephone Encounter (Signed)
 Patient confirmed

## 2023-11-03 ENCOUNTER — Inpatient Hospital Stay: Payer: Medicare Other | Attending: Hematology and Oncology | Admitting: Hematology and Oncology

## 2023-11-03 VITALS — BP 139/67 | HR 93 | Temp 98.6°F | Resp 18 | Wt 147.9 lb

## 2023-11-03 DIAGNOSIS — R059 Cough, unspecified: Secondary | ICD-10-CM | POA: Diagnosis not present

## 2023-11-03 DIAGNOSIS — I2510 Atherosclerotic heart disease of native coronary artery without angina pectoris: Secondary | ICD-10-CM | POA: Insufficient documentation

## 2023-11-03 DIAGNOSIS — Z7982 Long term (current) use of aspirin: Secondary | ICD-10-CM | POA: Diagnosis not present

## 2023-11-03 DIAGNOSIS — G629 Polyneuropathy, unspecified: Secondary | ICD-10-CM | POA: Diagnosis not present

## 2023-11-03 DIAGNOSIS — Z170 Estrogen receptor positive status [ER+]: Secondary | ICD-10-CM | POA: Insufficient documentation

## 2023-11-03 DIAGNOSIS — Z808 Family history of malignant neoplasm of other organs or systems: Secondary | ICD-10-CM | POA: Diagnosis not present

## 2023-11-03 DIAGNOSIS — Z9889 Other specified postprocedural states: Secondary | ICD-10-CM | POA: Insufficient documentation

## 2023-11-03 DIAGNOSIS — Z79899 Other long term (current) drug therapy: Secondary | ICD-10-CM | POA: Diagnosis not present

## 2023-11-03 DIAGNOSIS — R232 Flushing: Secondary | ICD-10-CM | POA: Insufficient documentation

## 2023-11-03 DIAGNOSIS — Z881 Allergy status to other antibiotic agents status: Secondary | ICD-10-CM | POA: Insufficient documentation

## 2023-11-03 DIAGNOSIS — Z8249 Family history of ischemic heart disease and other diseases of the circulatory system: Secondary | ICD-10-CM | POA: Diagnosis not present

## 2023-11-03 DIAGNOSIS — I10 Essential (primary) hypertension: Secondary | ICD-10-CM | POA: Insufficient documentation

## 2023-11-03 DIAGNOSIS — Z79811 Long term (current) use of aromatase inhibitors: Secondary | ICD-10-CM | POA: Insufficient documentation

## 2023-11-03 DIAGNOSIS — Z823 Family history of stroke: Secondary | ICD-10-CM | POA: Diagnosis not present

## 2023-11-03 DIAGNOSIS — C50411 Malignant neoplasm of upper-outer quadrant of right female breast: Secondary | ICD-10-CM | POA: Insufficient documentation

## 2023-11-03 DIAGNOSIS — Z87891 Personal history of nicotine dependence: Secondary | ICD-10-CM | POA: Diagnosis not present

## 2023-11-03 DIAGNOSIS — Z1721 Progesterone receptor positive status: Secondary | ICD-10-CM | POA: Diagnosis not present

## 2023-11-03 DIAGNOSIS — R319 Hematuria, unspecified: Secondary | ICD-10-CM | POA: Diagnosis not present

## 2023-11-03 NOTE — Progress Notes (Signed)
 Oncology History  Malignant neoplasm of upper-outer quadrant of right breast in female, estrogen receptor positive (HCC)  08/12/2021 Mammogram   Mammogram showed possible mass in the right breast.  Diagnostic mammogram showed 2 highly suspicious upper outer right breast masses measuring 1.5 cm in the 9:30 position and 1.4 cm at the 10 o'clock position.  These masses or distortions mammographically spanning a distance of up to 3.5 cm.  No abnormal appearing right axillary lymph nodes.   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  Prognostic showed ER 99% positive strong staining PR 10% positive moderate staining, HER2 positive 3+ and Ki-67 of 30%.   09/15/2021 Surgery   Right lumpectomy [facility]): IDC, grade 1, 3.6cm, margins +, 4 SLN negative.  Prior prognostic panel: ER 99%, PR 10%, HER2 +   09/15/2021 Cancer Staging   Staging form: Breast, AJCC 8th Edition - Pathologic stage from 09/15/2021: Stage IA (pT2, pN0, cM0, G1, ER+, PR+, HER2+) - Signed by Crawford Morna Pickle, NP on 08/30/2022 Histologic grading system: 3 grade system   09/23/2021 Genetic Testing   Negative hereditary cancer genetic testing: no pathogenic variants detected in [facility] CustomNext-cancer +RNAinsight Panel.  Report date is September 23, 2021.   The CustomNext-Cancer+RNAinsight panel offered by [facility] includes sequencing and rearrangement analysis for the following 47 genes:  APC, ATM, AXIN2, BARD1, BMPR1A, BRCA1, BRCA2, BRIP1, CDH1, CDK4, CDKN2A, CHEK2, DICER1, EPCAM, GREM1, HOXB13, MEN1, MLH1, MSH2, MSH3, MSH6, MUTYH, NBN, NF1, NF2, NTHL1, PALB2, PMS2, POLD1, POLE, PTEN, RAD51C, RAD51D, RECQL, RET, SDHA, SDHAF2, SDHB, SDHC, SDHD, SMAD4, SMARCA4, STK11, TP53, TSC1, TSC2, and VHL.  RNA data is routinely analyzed for use in variant interpretation for all genes.   10/15/2021 Surgery   Reexcision cleared margins.   11/10/2021 - 11/02/2022 Chemotherapy    Patient is on Treatment Plan : BREAST Docetaxel  + Carboplatin  + Trastuzumab  (TCH) q21d / Trastuzumab  q21d     03/30/2022 - 04/28/2022 Radiation Therapy   Site Technique Total Dose (Gy) Dose per Fx (Gy) Completed Fx Beam Energies  Breast, Right: Breast_R 3D 42.56/42.56 2.66 16/16 6X, 10X  Breast, Right: Breast_R_Bst 3D 8/8 2 4/4 6X, 10X     05/2022 -  Anti-estrogen oral therapy   Anastrozole  daily     INTERVAL HISTORY:   Discussed the use of AI scribe software for clinical note transcription with the patient, who gave verbal consent to proceed.  History of Present Illness Rhonda Richardson is an 83 year old female with breast cancer and coronary artery disease who presents for a follow-up visit.  She is currently on anastrozole  for breast cancer and experiences hot flashes, which she attributes to either the medication or environmental factors. The hot flashes are manageable, and she uses a fan at night to cool down.  She experiences mild neuropathy in her feet, which she describes as not bothersome. She takes alpha lipoic acid and vitamin D but is unsure of their effectiveness. She has opted not to take gabapentin  due to potential side effects.  She had a chest x-ray following a cough, which she believes was due to allergies. The cough resolved by the time the x-ray was performed, and she did not experience fever.  In August 2024, she underwent breast surgery to address sagging and asymmetry following radiation treatment.  She experienced hematuria and underwent a cystoscopy, which revealed inflammation but no cancer. This episode followed a urinary tract infection.  She has a history of coronary  artery disease and is on a statin and aspirin  for prevention of heart attack, given her family history. Initially resistant to taking a statin, she decided to proceed with it.  For osteoporosis, she engages in weight-bearing exercises and physical therapy to strengthen muscles and improve  balance. She takes vitamin D.    REVIEW OF SYSTEMS:  Review of Systems  Constitutional:  Negative for appetite change, chills, fatigue, fever and unexpected weight change.  HENT:   Negative for hearing loss, lump/mass and trouble swallowing.   Eyes:  Negative for eye problems and icterus.  Respiratory:  Negative for chest tightness, cough and shortness of breath.   Cardiovascular:  Negative for chest pain, leg swelling and palpitations.  Gastrointestinal:  Negative for abdominal distention, abdominal pain, constipation, diarrhea, nausea and vomiting.  Endocrine: Negative for hot flashes.  Genitourinary:  Negative for difficulty urinating.   Musculoskeletal:  Negative for arthralgias.  Skin:  Negative for itching and rash.  Neurological:  Negative for dizziness, extremity weakness, headaches and numbness.  Hematological:  Negative for adenopathy. Does not bruise/bleed easily.  Psychiatric/Behavioral:  Negative for depression. The patient is not nervous/anxious.    Breast: Denies any new nodularity, masses, tenderness, nipple changes, or nipple discharge.       PAST MEDICAL/SURGICAL HISTORY:  Past Medical History:  Diagnosis Date   Anemia    Breast cancer (HCC)    GERD (gastroesophageal reflux disease)    Hypertension    Nonocclusive coronary atherosclerosis of native coronary artery 03/03/2023   Port-A-Cath in place 11/18/2021   Past Surgical History:  Procedure Laterality Date   BREAST LUMPECTOMY WITH RADIOACTIVE SEED AND SENTINEL LYMPH NODE BIOPSY Right 09/15/2021   Procedure: RIGHT BREAST SEED BRACKETED LUMPECTOMY AND SENTINEL NODE BIOPSY;  Surgeon: Aron Shoulders, MD;  Location: [facility];  Service: General;  Laterality: Right;   BREAST REDUCTION SURGERY Left 12/24/2022   Procedure: MAMMARY REDUCTION  (BREAST);  Surgeon: Arelia Filippo, MD;  Location: [facility];  Service: Plastics;  Laterality: Left;   cataract surgery Bilateral     CYSTOSCOPY WITH BIOPSY N/A 03/29/2023   Procedure: CYSTOSCOPY WITH BLADDER BIOPSY;  Surgeon: Elisabeth Valli BIRCH, MD;  Location: [facility];  Service: Urology;  Laterality: N/A;  30 MINUTES   CYSTOSCOPY WITH FULGERATION N/A 03/29/2023   Procedure: CYSTOSCOPY WITH FULGERATION;  Surgeon: Elisabeth Valli BIRCH, MD;  Location: [facility];  Service: Urology;  Laterality: N/A;   DILATION AND CURETTAGE OF UTERUS  1970   EXCISION OF BREAST LESION Right 10/15/2021   Procedure: ASPIRATION RIGHT AXILLARY SEROMA;  Surgeon: Aron Shoulders, MD;  Location: [facility];  Service: General;  Laterality: Right;   eyelid tendon repair Bilateral    MASTOPEXY Right 12/24/2022   Procedure: MASTOPEXY;  Surgeon: Arelia Filippo, MD;  Location: [facility];  Service: Plastics;  Laterality: Right;   PORT-A-CATH REMOVAL Left 12/24/2022   Procedure: REMOVAL CHEST PORT;  Surgeon: Arelia Filippo, MD;  Location: [facility];  Service: Plastics;  Laterality: Left;   PORTACATH PLACEMENT N/A 09/15/2021   Procedure: PORT PLACEMENT;  Surgeon: Aron Shoulders, MD;  Location: [facility];  Service: General;  Laterality: N/A;   RE-EXCISION OF BREAST LUMPECTOMY Right 10/15/2021   Procedure: RE-EXCISION LUMPECTOMY RIGHT BREAST;  Surgeon: Aron Shoulders, MD;  Location: [facility];  Service: General;  Laterality: Right;     ALLERGIES:  Allergies  Allergen Reactions   Macrodantin [Nitrofurantoin] Hives     CURRENT MEDICATIONS:  Outpatient Encounter Medications  as of 11/03/2023  Medication Sig   ALPHA LIPOIC ACID-BIOTIN PO Take 1 tablet by mouth daily.   anastrozole  (ARIMIDEX ) 1 MG tablet Take 1 tablet (1 mg total) by mouth daily.   aspirin  EC 81 MG tablet Take 1 tablet (81 mg total) by mouth daily. Swallow whole.   benazepril (LOTENSIN) 20 MG tablet Take 20 mg by mouth in the morning.   cholecalciferol (VITAMIN D3) 25 MCG (1000 UNIT) tablet Take 1,000 Units by mouth in the morning.   famotidine (PEPCID) 20 MG  tablet Take 20 mg by mouth daily as needed for heartburn or indigestion.   hydrochlorothiazide (MICROZIDE) 12.5 MG capsule Take 12.5 mg by mouth in the morning.   magnesium oxide (MAG-OX) 400 (240 Mg) MG tablet Take 400 mg by mouth in the morning.   Multiple Vitamin (MULTIVITAMIN WITH MINERALS) TABS tablet Take 1 tablet by mouth in the morning.   rosuvastatin  (CRESTOR ) 20 MG tablet Take 1 tablet (20 mg total) by mouth daily.   traMADol  (ULTRAM ) 50 MG tablet Take 1 tablet (50 mg total) by mouth every 6 (six) hours as needed.   No facility-administered encounter medications on file as of 11/03/2023.     ONCOLOGIC FAMILY HISTORY:  Family History  Problem Relation Age of Onset   Heart disease Mother        Had phlebitis for several years.  Had a premenopausal coronary thrombosis at age 78.  Was on coumadin and other drugs.  Had mild strokes later and had angina.  Had coronary bypass surgery in 11-27-1979 and died in her sleep after rehab from a stroke in 11-26-1992.   Heart disease Father 33       Had congestive heart failure and died of myocardial infarction at age 65.  He was a long time smoker.   Thyroid  cancer Other 14       niece's daughter   Heart disease Brother        Had heart disease for several years and died at age 69 during a stress test.  Was obese.   Heart disease Brother        Had clotting issues, diagnosed heart disease and died at age 39.  Was a smoker and obese.     SOCIAL HISTORY:  Social History   Socioeconomic History   Marital status: Married    Spouse name: Not on file   Number of children: Not on file   Years of education: Not on file   Highest education level: Not on file  Occupational History   Not on file  Tobacco Use   Smoking status: Former    Types: Cigarettes   Smokeless tobacco: Not on file  Vaping Use   Vaping status: Never Used  Substance and Sexual Activity   Alcohol use: Not Currently   Drug use: Never   Sexual activity: Yes    Birth  control/protection: Post-menopausal  Other Topics Concern   Not on file  Social History Narrative   Not on file   Social Drivers of Health   Financial Resource Strain: Low Risk  (09/09/2021)   Overall Financial Resource Strain (CARDIA)    Difficulty of Paying Living Expenses: Not hard at all  Food Insecurity: No Food Insecurity (09/09/2021)   Hunger Vital Sign    Worried About Running Out of Food in the Last Year: Never true    Ran Out of Food in the Last Year: Never true  Transportation Needs: No Transportation Needs (09/09/2021)   PRAPARE -  Administrator, Civil Service (Medical): No    Lack of Transportation (Non-Medical): No  Physical Activity: Not on file  Stress: Not on file  Social Connections: Not on file  Intimate Partner Violence: Not on file     OBSERVATIONS/OBJECTIVE:  BP 139/67 (BP Location: Left Arm)   Pulse 93   Temp 98.6 F (37 C) (Temporal)   Resp 18   Wt 147 lb 14.4 oz (67.1 kg)   SpO2 95%   BMI 27.05 kg/m  GENERAL: Patient is a well appearing female in no acute distress NODES:  No cervical, supraclavicular, or axillary lymphadenopathy palpated.  BREAST EXAM: s/p bilateral breast reduction surgery. Palpable scar around surgical incision. NO other masses. No regional adenopathy No LE edema   LABORATORY DATA:  None for this visit.  DIAGNOSTIC IMAGING:  None for this visit.      ASSESSMENT AND PLAN:  Ms.. Casares is a pleasant 83 y.o. female with Stage 1A right breast invasive ductal carcinoma, ER+/PR+/HER2-, diagnosed in 08/2021, treated with lumpectomy, adjuvant chemotherapy, maintenance trastuzumab , adjuvant radiation therapy, and anti-estrogen therapy with anastrozole  beginning in 05/2022.    Assessment and Plan Assessment & Plan Breast Cancer History of breast cancer, on anastrozole . Possible hot flashes, etiology uncertain. Recent normal mammogram. Discussed Guardant Reveal blood test for recurrence detection, not FDA approved . She  will research about this. - Continue anastrozole . - Provide pamphlet on Guardant Reveal test.  Osteoporosis Engaged in weight-bearing exercises and physical therapy. Considering Prolia, discussed efficacy and potential anti-cancer properties. Plan to reassess with bone density scan next year. - Continue weight-bearing exercises and physical therapy. - Continue vitamin D supplementation. - Reassess with bone density scan next year.  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 Prefers not to take gabapentin . - Continue alpha lipoic acid.  Moderate Coronary Artery Disease (CAD) Moderate CAD, on statin and aspirin  for myocardial infarction prevention due to family history. Regular cardiologist follow-ups. - Continue statin and aspirin . - Regular follow-ups with cardiologist.  General Health Maintenance Discussed colonoscopy as best screening test, recommended to continue due to good health. Pap smears discontinued due to age. - Continue regular colonoscopy screenings.    The patient was provided an opportunity to ask questions and all were answered. The patient agreed with the plan and demonstrated an understanding of the instructions.   Total encounter time:30 minutes*in face-to-face visit time, chart review, lab review, care coordination, order entry, and documentation of the encounter time.  *Total Encounter Time as defined by the Centers for Medicare and Medicaid Services includes, in addition to the face-to-face time of a patient visit (documented in the note above) non-face-to-face time: obtaining and reviewing outside history, ordering and reviewing medications, tests or procedures, care coordination (communications with other health care professionals or caregivers) and documentation in the medical record.

## 2023-11-07 ENCOUNTER — Ambulatory Visit: Attending: General Surgery

## 2023-11-07 VITALS — Wt 151.1 lb

## 2023-11-07 DIAGNOSIS — Z483 Aftercare following surgery for neoplasm: Secondary | ICD-10-CM | POA: Insufficient documentation

## 2023-11-07 NOTE — Therapy (Signed)
 OUTPATIENT PHYSICAL THERAPY SOZO SCREENING NOTE   Patient Name: Rhonda Richardson MRN: [id number] DOB:May 21, 1940, 83 y.o., female Today's Date: 11/07/2023  PCP: Leonel Cole, MD REFERRING PROVIDER: Aron Shoulders, MD   PT End of Session - 11/07/23 1603     Visit Number 4   # unchanged due to screen only   PT Start Time 1600    PT Stop Time 1604    PT Time Calculation (min) 4 min    Activity Tolerance Patient tolerated treatment well    Behavior During Therapy [facility] for tasks assessed/performed          Past Medical History:  Diagnosis Date   Anemia    Breast cancer (HCC)    GERD (gastroesophageal reflux disease)    Hypertension    Nonocclusive coronary atherosclerosis of native coronary artery 03/03/2023   Port-A-Cath in place 11/18/2021   Past Surgical History:  Procedure Laterality Date   BREAST LUMPECTOMY WITH RADIOACTIVE SEED AND SENTINEL LYMPH NODE BIOPSY Right 09/15/2021   Procedure: RIGHT BREAST SEED BRACKETED LUMPECTOMY AND SENTINEL NODE BIOPSY;  Surgeon: Aron Shoulders, MD;  Location: [facility];  Service: General;  Laterality: Right;   BREAST REDUCTION SURGERY Left 12/24/2022   Procedure: MAMMARY REDUCTION  (BREAST);  Surgeon: Arelia Filippo, MD;  Location: [facility];  Service: Plastics;  Laterality: Left;   cataract surgery Bilateral    CYSTOSCOPY WITH BIOPSY N/A 03/29/2023   Procedure: CYSTOSCOPY WITH BLADDER BIOPSY;  Surgeon: Elisabeth Valli BIRCH, MD;  Location: [facility];  Service: Urology;  Laterality: N/A;  30 MINUTES   CYSTOSCOPY WITH FULGERATION N/A 03/29/2023   Procedure: CYSTOSCOPY WITH FULGERATION;  Surgeon: Elisabeth Valli BIRCH, MD;  Location: [facility];  Service: Urology;  Laterality: N/A;   DILATION AND CURETTAGE OF UTERUS  1970   EXCISION OF BREAST LESION Right 10/15/2021   Procedure: ASPIRATION RIGHT AXILLARY SEROMA;  Surgeon: Aron Shoulders, MD;  Location: [facility];  Service: General;  Laterality: Right;   eyelid tendon repair  Bilateral    MASTOPEXY Right 12/24/2022   Procedure: MASTOPEXY;  Surgeon: Arelia Filippo, MD;  Location: [facility];  Service: Plastics;  Laterality: Right;   PORT-A-CATH REMOVAL Left 12/24/2022   Procedure: REMOVAL CHEST PORT;  Surgeon: Arelia Filippo, MD;  Location: [facility];  Service: Plastics;  Laterality: Left;   PORTACATH PLACEMENT N/A 09/15/2021   Procedure: PORT PLACEMENT;  Surgeon: Aron Shoulders, MD;  Location: [facility];  Service: General;  Laterality: N/A;   RE-EXCISION OF BREAST LUMPECTOMY Right 10/15/2021   Procedure: RE-EXCISION LUMPECTOMY RIGHT BREAST;  Surgeon: Aron Shoulders, MD;  Location: [facility];  Service: General;  Laterality: Right;   Patient Active Problem List   Diagnosis Date Noted   Nonocclusive coronary atherosclerosis of native coronary artery 03/03/2023   Family history of ischemic heart disease (IHD) 08/30/2022   History of dysplastic nevus 08/30/2022   Genetic testing 09/28/2021   Essential hypertension 09/09/2021   Malignant neoplasm of upper-outer quadrant of right breast in female, estrogen receptor positive (HCC) 09/08/2021   Gastroesophageal reflux disease 04/08/2016   Sensorineural hearing loss (SNHL), 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 She had a right lumpectomy and sentinel node biopsy (0/4 nodes positive) on 09/15/2021 followed by a re-excision on 10/15/2021. It is triple positive  with a Ki67 of 30%   PRECAUTIONS: right UE Lymphedema risk, None  SUBJECTIVE: Pt returns for her last 3 month L-Dex screen.   PAIN:  Are you having pain? No  SOZO SCREENING: Patient was assessed today using the SOZO machine to determine the lymphedema index score. This was compared to her baseline score.  It was determined that she is within the recommended range when compared to her baseline and no further action is needed at this time. She will continue SOZO screenings. These are done every 3 months for 2 years post operatively followed by every 6 months for 2 years, and then annually.   L-DEX FLOWSHEETS - 11/07/23 1600       L-DEX LYMPHEDEMA SCREENING   Measurement Type Unilateral    L-DEX MEASUREMENT EXTREMITY Upper Extremity    POSITION  Standing    DOMINANT SIDE Right    At Risk Side Right    BASELINE SCORE (UNILATERAL) -1.9    L-DEX SCORE (UNILATERAL) 0.7    VALUE CHANGE (UNILAT) 2.6         P: Begin 6 month L-Dex.   Aden Berwyn Caldron, PTA 11/07/2023, 4:04 PM

## 2023-11-08 DIAGNOSIS — R2681 Unsteadiness on feet: Secondary | ICD-10-CM | POA: Diagnosis not present

## 2023-11-14 IMAGING — MG MM PLC BREAST LOC DEV 1ST LESION INC*R*
7 series · 7 of 7 positions shown · non-contrast
Comparison: Previous exams.

CLINICAL DATA: 80-year-old female with newly diagnosed right breast
invasive ductal carcinoma x2 presenting for seed localization.

EXAM:
MAMMOGRAPHIC GUIDED RADIOACTIVE SEED LOCALIZATION OF THE RIGHT
BREAST x 2

[R CC (1 of 3)]
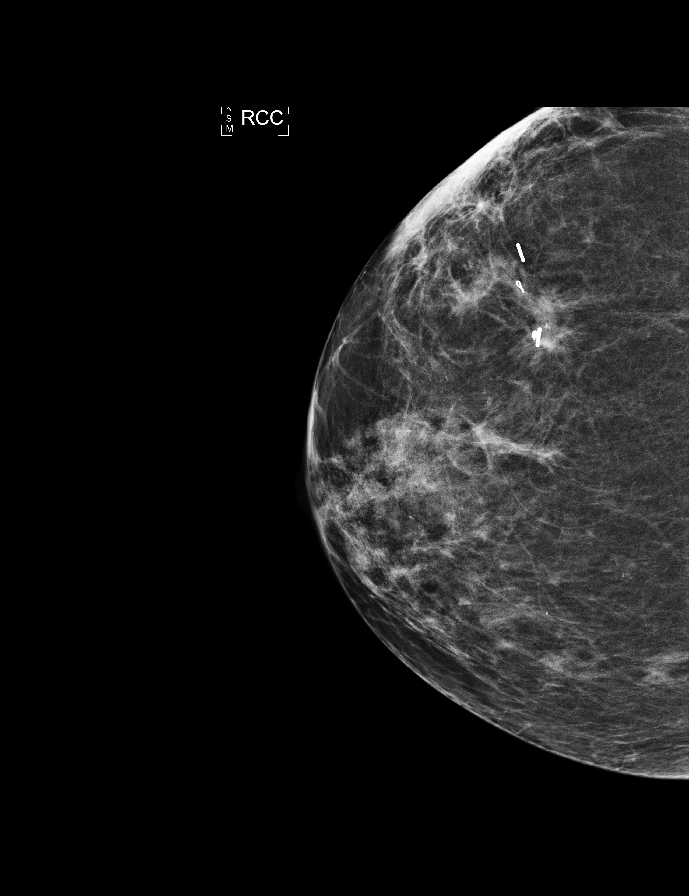

[R LM (1 of 4)]
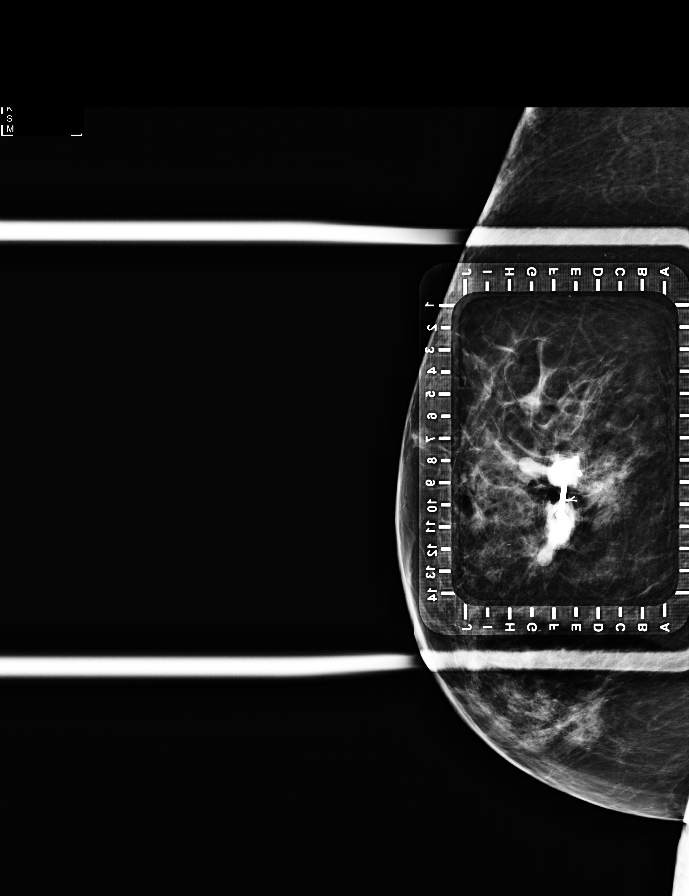

[R CC (2 of 3)]
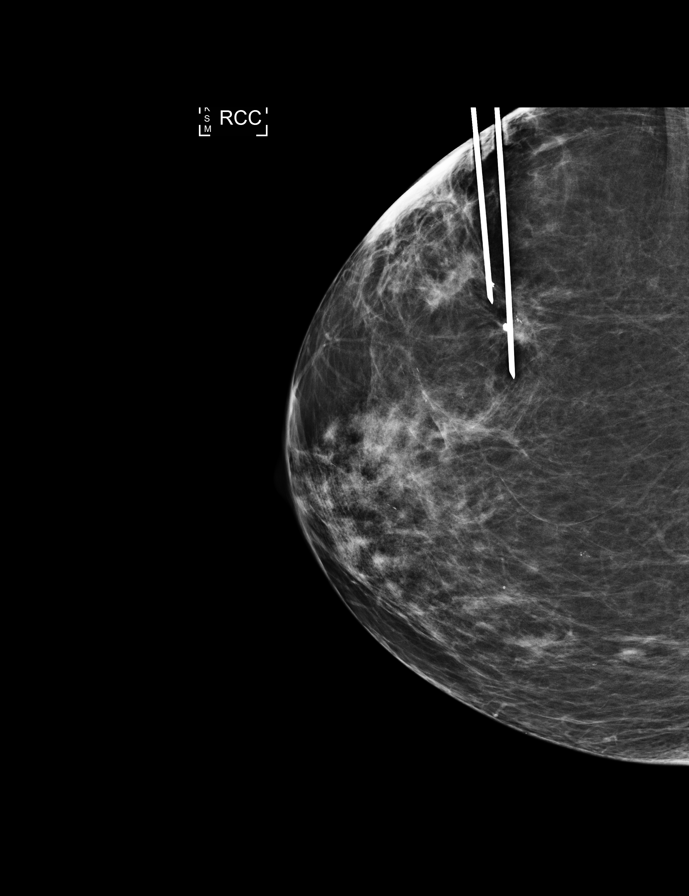

[R LM (2 of 4)]
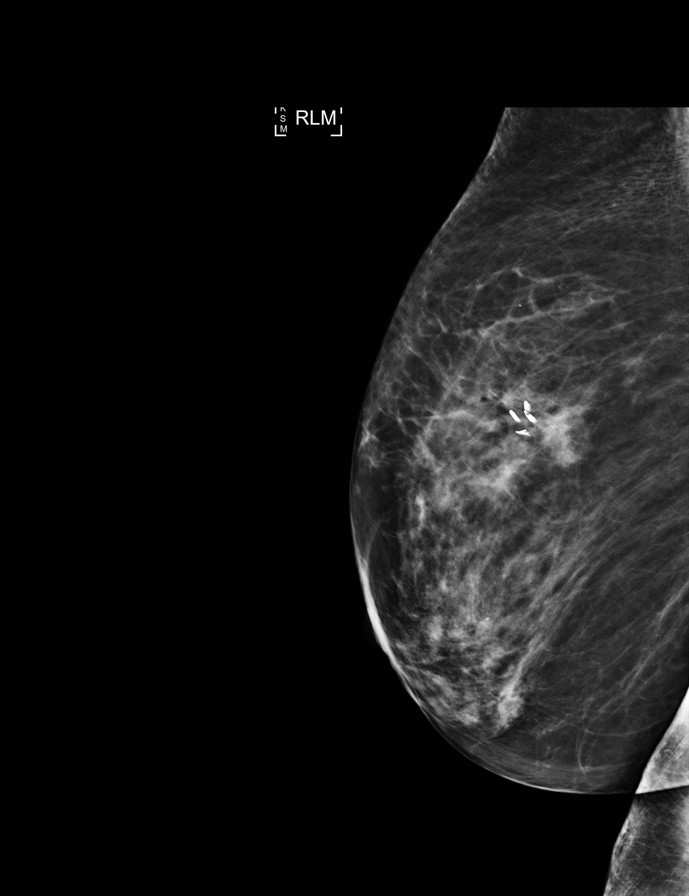

[R LM (3 of 4)]
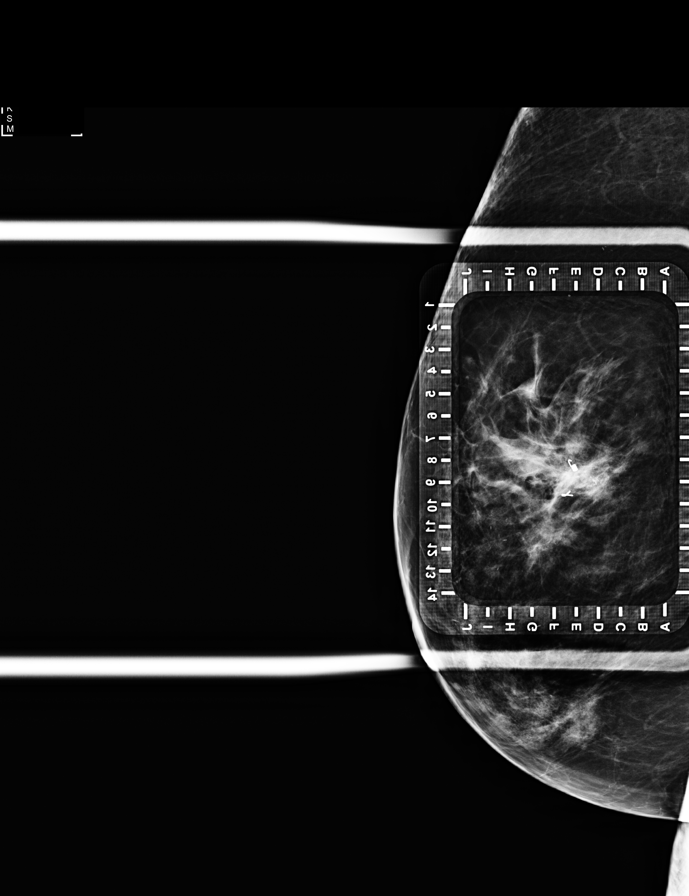

[R CC (3 of 3)]
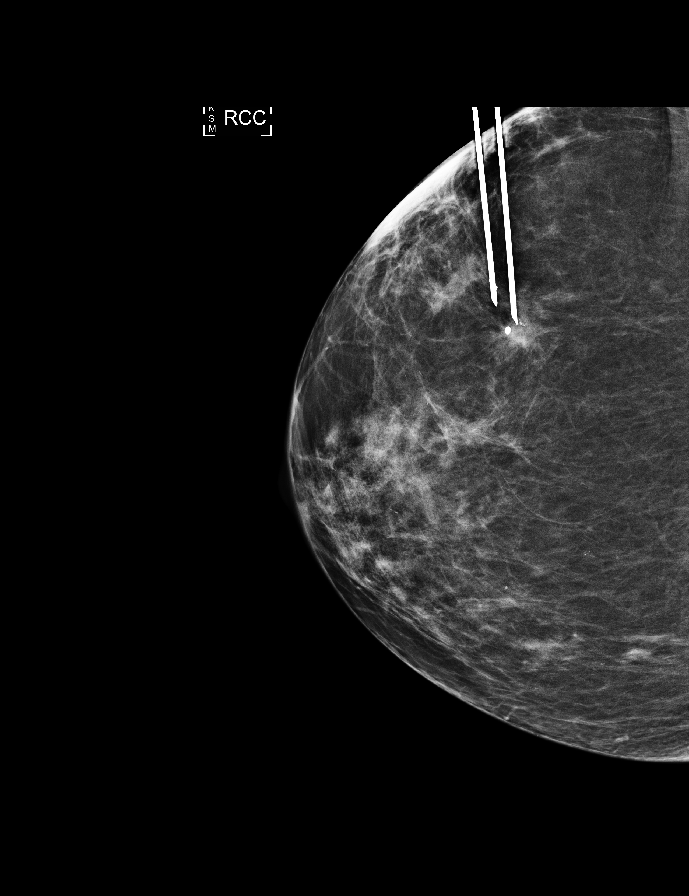

[R LM (4 of 4)]
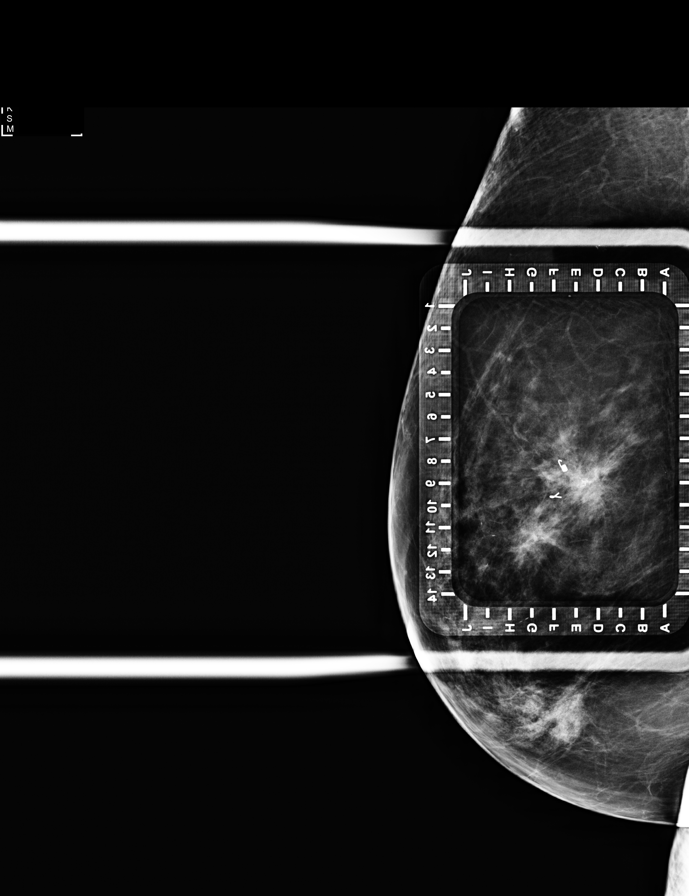

[7 of 7 positions shown; findings below may reference images not displayed]

FINDINGS: Patient presents for radioactive seed localization prior to right
lumpectomy. I met with the patient and we discussed the procedure of
seed localization including benefits and alternatives. We discussed
the high likelihood of a successful procedure. We discussed the
risks of the procedure including infection, bleeding, tissue injury
and further surgery. We discussed the low dose of radioactivity
involved in the procedure. Informed, written consent was given.

The usual time-out protocol was performed immediately prior to the
procedure.

1. Using mammographic guidance, sterile technique, 1% lidocaine and
an S-XP6 radioactive seed, the coil biopsy marking clip was
localized using a lateral approach. The follow-up mammogram images
confirm the seed in the expected location and were marked for Dr.
Pohlman.

Follow-up survey of the patient confirms presence of the radioactive
seed.

Order number of S-XP6 seed:  [id number].

Total activity: 0.247 mCi reference Date: [DATE] [DATE], [DATE]

[DATE]. Using mammographic guidance, sterile technique, 1% lidocaine and
an S-XP6 radioactive seed, the mass slightly lateral to the ribbon
shaped biopsy marking clip was localized using a lateral approach.
The follow-up mammogram images confirm the seed in the expected
location and were marked for Dr. Nickelsen.

Follow-up survey of the patient confirms presence of the radioactive
seed.

Order number of S-XP6 seed:  [id number].

Total activity: 0.256 mCi reference Date: August 02, 2021

The patient tolerated the procedure well and was released from the
[REDACTED]. She was given instructions regarding seed removal.
IMPRESSION: Radioactive seed localization right breast. No apparent
complications.

## 2023-11-16 DIAGNOSIS — R2681 Unsteadiness on feet: Secondary | ICD-10-CM | POA: Diagnosis not present

## 2023-11-21 DIAGNOSIS — R2681 Unsteadiness on feet: Secondary | ICD-10-CM | POA: Diagnosis not present

## 2023-12-14 DIAGNOSIS — Z961 Presence of intraocular lens: Secondary | ICD-10-CM | POA: Diagnosis not present

## 2023-12-14 DIAGNOSIS — H26493 Other secondary cataract, bilateral: Secondary | ICD-10-CM | POA: Diagnosis not present

## 2023-12-15 DIAGNOSIS — R2681 Unsteadiness on feet: Secondary | ICD-10-CM | POA: Diagnosis not present

## 2023-12-20 DIAGNOSIS — R2681 Unsteadiness on feet: Secondary | ICD-10-CM | POA: Diagnosis not present

## 2023-12-28 DIAGNOSIS — R2681 Unsteadiness on feet: Secondary | ICD-10-CM | POA: Diagnosis not present

## 2024-01-05 DIAGNOSIS — R2681 Unsteadiness on feet: Secondary | ICD-10-CM | POA: Diagnosis not present

## 2024-02-27 DIAGNOSIS — R2681 Unsteadiness on feet: Secondary | ICD-10-CM | POA: Diagnosis not present

## 2024-03-01 DIAGNOSIS — R2681 Unsteadiness on feet: Secondary | ICD-10-CM | POA: Diagnosis not present

## 2024-03-01 DIAGNOSIS — I10 Essential (primary) hypertension: Secondary | ICD-10-CM | POA: Diagnosis not present

## 2024-03-01 DIAGNOSIS — E7800 Pure hypercholesterolemia, unspecified: Secondary | ICD-10-CM | POA: Diagnosis not present

## 2024-03-01 DIAGNOSIS — M810 Age-related osteoporosis without current pathological fracture: Secondary | ICD-10-CM | POA: Diagnosis not present

## 2024-03-02 DIAGNOSIS — K219 Gastro-esophageal reflux disease without esophagitis: Secondary | ICD-10-CM | POA: Diagnosis not present

## 2024-03-02 DIAGNOSIS — E559 Vitamin D deficiency, unspecified: Secondary | ICD-10-CM | POA: Diagnosis not present

## 2024-03-02 DIAGNOSIS — Z0000 Encounter for general adult medical examination without abnormal findings: Secondary | ICD-10-CM | POA: Diagnosis not present

## 2024-03-02 DIAGNOSIS — C50911 Malignant neoplasm of unspecified site of right female breast: Secondary | ICD-10-CM | POA: Diagnosis not present

## 2024-03-02 DIAGNOSIS — E7800 Pure hypercholesterolemia, unspecified: Secondary | ICD-10-CM | POA: Diagnosis not present

## 2024-03-02 DIAGNOSIS — M810 Age-related osteoporosis without current pathological fracture: Secondary | ICD-10-CM | POA: Diagnosis not present

## 2024-03-02 DIAGNOSIS — I2510 Atherosclerotic heart disease of native coronary artery without angina pectoris: Secondary | ICD-10-CM | POA: Diagnosis not present

## 2024-03-02 DIAGNOSIS — Z8249 Family history of ischemic heart disease and other diseases of the circulatory system: Secondary | ICD-10-CM | POA: Diagnosis not present

## 2024-03-02 DIAGNOSIS — I10 Essential (primary) hypertension: Secondary | ICD-10-CM | POA: Diagnosis not present

## 2024-03-12 DIAGNOSIS — R2681 Unsteadiness on feet: Secondary | ICD-10-CM | POA: Diagnosis not present

## 2024-03-16 DIAGNOSIS — R2681 Unsteadiness on feet: Secondary | ICD-10-CM | POA: Diagnosis not present

## 2024-03-19 DIAGNOSIS — R2681 Unsteadiness on feet: Secondary | ICD-10-CM | POA: Diagnosis not present

## 2024-03-21 DIAGNOSIS — R2681 Unsteadiness on feet: Secondary | ICD-10-CM | POA: Diagnosis not present

## 2024-03-28 ENCOUNTER — Telehealth: Payer: Self-pay | Admitting: Hematology and Oncology

## 2024-03-28 DIAGNOSIS — R2681 Unsteadiness on feet: Secondary | ICD-10-CM | POA: Diagnosis not present

## 2024-03-28 NOTE — Telephone Encounter (Signed)
 I spoke to pt regarding 11/05/24 appt being rescheduled to 11/09/24. Patient is aware of new appt date and time.

## 2024-04-16 ENCOUNTER — Other Ambulatory Visit: Payer: Self-pay | Admitting: Hematology and Oncology

## 2024-05-07 ENCOUNTER — Ambulatory Visit

## 2024-05-14 ENCOUNTER — Ambulatory Visit

## 2024-05-22 ENCOUNTER — Ambulatory Visit

## 2024-06-12 ENCOUNTER — Ambulatory Visit (HOSPITAL_BASED_OUTPATIENT_CLINIC_OR_DEPARTMENT_OTHER): Admitting: Cardiology

## 2024-11-05 ENCOUNTER — Ambulatory Visit: Admitting: Hematology and Oncology

## 2024-11-09 ENCOUNTER — Inpatient Hospital Stay: Admitting: Hematology and Oncology
